# Patient Record
Sex: Female | Born: 1950
Health system: Southern US, Community
[De-identification: ages and names within clinical notes are randomized; demographics above are authoritative.]

## PROBLEM LIST (undated history)

## (undated) DIAGNOSIS — R296 Repeated falls: Secondary | ICD-10-CM

## (undated) DIAGNOSIS — Z6831 Body mass index (BMI) 31.0-31.9, adult: Secondary | ICD-10-CM

## (undated) DIAGNOSIS — M1990 Unspecified osteoarthritis, unspecified site: Secondary | ICD-10-CM

## (undated) DIAGNOSIS — F4323 Adjustment disorder with mixed anxiety and depressed mood: Secondary | ICD-10-CM

## (undated) DIAGNOSIS — R5383 Other fatigue: Secondary | ICD-10-CM

## (undated) DIAGNOSIS — Z9884 Bariatric surgery status: Secondary | ICD-10-CM

## (undated) DIAGNOSIS — S329XXA Fracture of unspecified parts of lumbosacral spine and pelvis, initial encounter for closed fracture: Secondary | ICD-10-CM

## (undated) DIAGNOSIS — I10 Essential (primary) hypertension: Secondary | ICD-10-CM

## (undated) DIAGNOSIS — R7612 Nonspecific reaction to cell mediated immunity measurement of gamma interferon antigen response without active tuberculosis: Secondary | ICD-10-CM

## (undated) DIAGNOSIS — Z9882 Breast implant status: Secondary | ICD-10-CM

## (undated) DIAGNOSIS — M069 Rheumatoid arthritis, unspecified: Secondary | ICD-10-CM

## (undated) DIAGNOSIS — J189 Pneumonia, unspecified organism: Secondary | ICD-10-CM

## (undated) DIAGNOSIS — E669 Obesity, unspecified: Secondary | ICD-10-CM

## (undated) DIAGNOSIS — F32A Depression, unspecified: Secondary | ICD-10-CM

## (undated) HISTORY — DX: Fracture of unspecified parts of lumbosacral spine and pelvis, initial encounter for closed fracture: S32.9XXA

## (undated) HISTORY — DX: Other fatigue: R53.83

## (undated) HISTORY — DX: Adjustment disorder with mixed anxiety and depressed mood: F43.23

## (undated) HISTORY — DX: Nonspecific reaction to cell mediated immunity measurement of gamma interferon antigen response without active tuberculosis: R76.12

## (undated) HISTORY — PX: APPENDECTOMY: SHX54

## (undated) HISTORY — DX: Breast implant status: Z98.82

## (undated) HISTORY — PX: GASTRIC BYPASS: SHX52

## (undated) HISTORY — DX: Bariatric surgery status: Z98.84

## (undated) HISTORY — DX: Rheumatoid arthritis, unspecified: M06.9

## (undated) HISTORY — PX: TONSILLECTOMY: SUR1361

## (undated) HISTORY — DX: Body mass index (BMI) 31.0-31.9, adult: Z68.31

## (undated) HISTORY — DX: Obesity, unspecified: E66.9

## (undated) HISTORY — PX: COLONOSCOPY: SHX174

## (undated) HISTORY — PX: BREAST ENHANCEMENT SURGERY: SHX7

## (undated) HISTORY — DX: Depression, unspecified: F32.A

## (undated) HISTORY — DX: Pneumonia, unspecified organism: J18.9

## (undated) HISTORY — DX: Unspecified osteoarthritis, unspecified site: M19.90

## (undated) HISTORY — PX: AUGMENTATION MAMMAPLASTY: SUR837

---

## 2016-05-02 LAB — HM MAMMOGRAPHY

## 2016-05-23 LAB — HM COLONOSCOPY

## 2016-07-28 LAB — HM PAP SMEAR: HM Pap smear: NORMAL

## 2017-01-12 NOTE — Progress Notes (Signed)
Subjective:    Patient ID: Julia [name], female    DOB: 05-04-1951, 66 y.o.   MRN: [id number]  HPI She is here to establish with a new pcp.    RA:  She is currently on prednisone 2 mg daily and rituxan.  She feels her RA is well controlled.  She denies joint pain or swelling.    Obese:  She is taking xenicl and has lost 40 lb over the last two years.  She tolerates the medication well.   Sensation of right foot slipping when she walks:  This started 6-8 weeks ago and it feels like the right foot is slipping when she walks, but it is not slippping  She has some pain in her right leg intermittently - mostly at night - radiating from her back. She did have recent x-rays of her right hip and knee - both normal.  She denies numbness/tingling in her leg or foot.  She thinks it is worse if she wears heels, but occurs when wearing flats.  She wonders about seeing neurology.   GERD:  She is taking her medication daily as prescribed.  She denies any GERD symptoms and feels her GERD is well controlled.   Hypothyroidism:  She is taking her medication daily.  She denies any recent changes in energy or weight that are unexplained.     Medications and allergies reviewed with patient and updated if appropriate.  Patient Active Problem List   Diagnosis Date Noted  . Hypothyroidism 01/14/2017  . Altered sensation, foot 01/14/2017  . History of tuberculosis exposure 01/14/2017  . Osteoporosis 01/14/2017  . History of gastric bypass 01/14/2017  . Rheumatoid arthritis ([place]) 01/13/2017  . GERD (gastroesophageal reflux disease) 01/13/2017   Allergies as of 01/13/2017   Not on File     Medication List       Accurate as of 01/13/17 11:59 PM. Always use your most recent med list.          esomeprazole 40 MG capsule Commonly known as:  NEXIUM Take by mouth.   Fish Oil 1000 MG Caps Take by mouth.   glucosamine-chondroitin 500-400 MG tablet Take by mouth.   ibandronate 150 MG tablet Commonly  known as:  BONIVA take 1 tablet by mouth every month   levothyroxine 25 MCG tablet Commonly known as:  SYNTHROID, LEVOTHROID Take by mouth.   loratadine 10 MG tablet Commonly known as:  CLARITIN Take by mouth.   orlistat 120 MG capsule Commonly known as:  XENICAL Take by mouth.   predniSONE 1 MG tablet Commonly known as:  DELTASONE Take 2 mg by mouth daily.   PROAIR HFA 108 (90 Base) MCG/ACT inhaler Generic drug:  albuterol inhale 2 puffs by mouth every 4 hours if needed for wheezing   Pseudoephedrine-Guaifenesin [phone] MG Tb12 Take by mouth.   riTUXimab 500 MG/50ML injection Commonly known as:  RITUXAN Inject into the vein.   THERA-M Tabs Take by mouth.   TURMERIC CURCUMIN PO Take by mouth.   valACYclovir 500 MG tablet Commonly known as:  VALTREX Take 500 mg by mouth daily.   Vitamin D3 1000 units Caps Take by mouth.       Past Medical History:  Diagnosis Date  . H/O breast augmentation   . History of gastric bypass   . Pelvis fracture ([place])    in 30's    Past Surgical History:  Procedure Laterality Date  . APPENDECTOMY    . TONSILLECTOMY  Social History   Social History  . Marital status: Married    Spouse name: N/A  . Number of children: N/A  . Years of education: N/A   Social History Main Topics  . Smoking status: Never Smoker  . Smokeless tobacco: Never Used  . Alcohol use Yes  . Drug use: No  . Sexual activity: Not Asked   Other Topics Concern  . None   Social History Narrative  . None    Family History  Problem Relation Age of Onset  . Hypertension Mother   . Heart disease Father   . Hypertension Father     Review of Systems  Constitutional: Negative for chills and fever.  Respiratory: Negative for cough, shortness of breath and wheezing.   Cardiovascular: Negative for chest pain, palpitations and leg swelling.  Gastrointestinal: Negative for abdominal pain, blood in stool, constipation, diarrhea and nausea.    Musculoskeletal: Negative for joint swelling.  Neurological: Negative for dizziness, light-headedness and headaches.  Psychiatric/Behavioral: Negative for dysphoric mood. The patient is not nervous/anxious.        Objective:   Vitals:   01/13/17 1049  BP: (!) 154/94  Pulse: 68  Temp: 97.8 F (36.6 C)   Filed Weights   01/13/17 1049  Weight: 180 lb (81.6 kg)   Body mass index is 30.9 kg/m.  Wt Readings from Last 3 Encounters:  01/13/17 180 lb (81.6 kg)     Physical Exam Constitutional: She appears well-developed and well-nourished. No distress.  HENT:  Head: Normocephalic and atraumatic.  Right Ear: External ear normal. Normal ear canal and TM Left Ear: External ear normal.  Normal ear canal and TM Mouth/Throat: Oropharynx is clear and moist.  Eyes: Conjunctivae and EOM are normal.  Neck: Neck supple. No tracheal deviation present. No thyromegaly present.  No carotid bruit  Cardiovascular: Normal rate, regular rhythm and normal heart sounds.   No murmur heard.  No edema. Pulmonary/Chest: Effort normal and breath sounds normal. No respiratory distress. She has no wheezes. She has no rales.  Abdominal: Soft. She exhibits no distension. There is no tenderness.  Lymphadenopathy: She has no cervical adenopathy.  Skin: Skin is warm and dry. She is not diaphoretic.  Psychiatric: She has a normal mood and affect. Her behavior is normal.         Assessment & Plan:   See Problem List for Assessment and Plan of chronic medical problems.

## 2017-01-13 ENCOUNTER — Ambulatory Visit (INDEPENDENT_AMBULATORY_CARE_PROVIDER_SITE_OTHER): Payer: BLUE CROSS/BLUE SHIELD | Admitting: Internal Medicine

## 2017-01-13 ENCOUNTER — Encounter: Payer: Self-pay | Admitting: Internal Medicine

## 2017-01-13 VITALS — BP 154/94 | HR 68 | Temp 97.8°F | Ht 64.0 in | Wt 180.0 lb

## 2017-01-13 DIAGNOSIS — M069 Rheumatoid arthritis, unspecified: Secondary | ICD-10-CM

## 2017-01-13 DIAGNOSIS — Z201 Contact with and (suspected) exposure to tuberculosis: Secondary | ICD-10-CM

## 2017-01-13 DIAGNOSIS — K219 Gastro-esophageal reflux disease without esophagitis: Secondary | ICD-10-CM

## 2017-01-13 DIAGNOSIS — M810 Age-related osteoporosis without current pathological fracture: Secondary | ICD-10-CM | POA: Diagnosis not present

## 2017-01-13 DIAGNOSIS — R209 Unspecified disturbances of skin sensation: Secondary | ICD-10-CM | POA: Diagnosis not present

## 2017-01-13 DIAGNOSIS — E039 Hypothyroidism, unspecified: Secondary | ICD-10-CM

## 2017-01-13 DIAGNOSIS — Z1283 Encounter for screening for malignant neoplasm of skin: Secondary | ICD-10-CM | POA: Diagnosis not present

## 2017-01-13 HISTORY — DX: Gastro-esophageal reflux disease without esophagitis: K21.9

## 2017-01-13 HISTORY — DX: Rheumatoid arthritis, unspecified: M06.9

## 2017-01-13 NOTE — Patient Instructions (Signed)
   Medications reviewed and updated.  No changes recommended at this time.  Your prescription(s) have been submitted to your pharmacy. Please take as directed and contact our office if you believe you are having problem(s) with the medication(s).  A referral was ordered for neurology and rheumatology.

## 2017-01-14 ENCOUNTER — Encounter: Payer: Self-pay | Admitting: Internal Medicine

## 2017-01-14 DIAGNOSIS — Z201 Contact with and (suspected) exposure to tuberculosis: Secondary | ICD-10-CM

## 2017-01-14 DIAGNOSIS — Z860100 Personal history of colon polyps, unspecified: Secondary | ICD-10-CM

## 2017-01-14 DIAGNOSIS — M810 Age-related osteoporosis without current pathological fracture: Secondary | ICD-10-CM

## 2017-01-14 DIAGNOSIS — R209 Unspecified disturbances of skin sensation: Secondary | ICD-10-CM | POA: Insufficient documentation

## 2017-01-14 DIAGNOSIS — E039 Hypothyroidism, unspecified: Secondary | ICD-10-CM | POA: Insufficient documentation

## 2017-01-14 DIAGNOSIS — Z9884 Bariatric surgery status: Secondary | ICD-10-CM | POA: Insufficient documentation

## 2017-01-14 HISTORY — DX: Personal history of colon polyps, unspecified: Z86.0100

## 2017-01-14 HISTORY — DX: Personal history of colonic polyps: Z86.010

## 2017-01-14 HISTORY — DX: Age-related osteoporosis without current pathological fracture: M81.0

## 2017-01-14 HISTORY — DX: Hypothyroidism, unspecified: E03.9

## 2017-01-14 HISTORY — DX: Contact with and (suspected) exposure to tuberculosis: Z20.1

## 2017-01-14 NOTE — Assessment & Plan Note (Signed)
GERD controlled Continue daily medication  

## 2017-01-14 NOTE — Assessment & Plan Note (Signed)
Has tried several medication in the past Currently well controlled on prednisone 2 mg and Rituxan Moving to area and needs to establish with new Rheumatologist - will refer to Dr Amil Amen

## 2017-01-14 NOTE — Assessment & Plan Note (Signed)
Will check tsh at CPE in a few months

## 2017-01-14 NOTE — Assessment & Plan Note (Signed)
Feeling of right foot slipping when she walks Will refer to neuro

## 2017-01-14 NOTE — Assessment & Plan Note (Signed)
On Boniva 

## 2017-01-27 ENCOUNTER — Encounter: Payer: Self-pay | Admitting: Neurology

## 2017-02-21 ENCOUNTER — Encounter: Payer: Self-pay | Admitting: Internal Medicine

## 2017-03-06 ENCOUNTER — Ambulatory Visit: Payer: BLUE CROSS/BLUE SHIELD | Admitting: Neurology

## 2017-03-17 ENCOUNTER — Other Ambulatory Visit: Payer: Self-pay | Admitting: Internal Medicine

## 2017-03-21 ENCOUNTER — Telehealth: Payer: Self-pay | Admitting: Internal Medicine

## 2017-03-21 MED ORDER — ORLISTAT 120 MG PO CAPS: 120.0000 mg | ORAL_CAPSULE | Freq: Three times a day (TID) | ORAL | 3 refills | Status: DC

## 2017-03-21 NOTE — Telephone Encounter (Signed)
Sent electronically...Julia [name]

## 2017-03-21 NOTE — Telephone Encounter (Signed)
Ok to fill - she just established with me

## 2017-03-21 NOTE — Telephone Encounter (Signed)
Pls advise since med has not been refilled by you...Julia [name]

## 2017-03-21 NOTE — Telephone Encounter (Signed)
Requesting refill on xenical

## 2017-03-23 ENCOUNTER — Ambulatory Visit: Payer: BLUE CROSS/BLUE SHIELD | Admitting: Neurology

## 2017-04-17 ENCOUNTER — Other Ambulatory Visit: Payer: Self-pay | Admitting: Internal Medicine

## 2017-04-18 DIAGNOSIS — D229 Melanocytic nevi, unspecified: Secondary | ICD-10-CM | POA: Insufficient documentation

## 2017-04-18 DIAGNOSIS — D692 Other nonthrombocytopenic purpura: Secondary | ICD-10-CM

## 2017-04-18 DIAGNOSIS — L723 Sebaceous cyst: Secondary | ICD-10-CM

## 2017-04-18 DIAGNOSIS — D1800 Hemangioma unspecified site: Secondary | ICD-10-CM

## 2017-04-18 DIAGNOSIS — L814 Other melanin hyperpigmentation: Secondary | ICD-10-CM

## 2017-04-18 DIAGNOSIS — Q8789 Other specified congenital malformation syndromes, not elsewhere classified: Secondary | ICD-10-CM

## 2017-04-18 HISTORY — DX: Hemangioma unspecified site: D18.00

## 2017-04-18 HISTORY — DX: Melanocytic nevi, unspecified: D22.9

## 2017-04-18 HISTORY — DX: Sebaceous cyst: L72.3

## 2017-04-18 HISTORY — DX: Other specified congenital malformation syndromes, not elsewhere classified: Q87.89

## 2017-04-18 HISTORY — DX: Other melanin hyperpigmentation: L81.4

## 2017-04-18 HISTORY — DX: Other nonthrombocytopenic purpura: D69.2

## 2017-05-16 ENCOUNTER — Other Ambulatory Visit: Payer: Self-pay | Admitting: Internal Medicine

## 2017-06-08 ENCOUNTER — Other Ambulatory Visit: Payer: Self-pay | Admitting: Internal Medicine

## 2017-06-19 NOTE — Progress Notes (Signed)
Subjective:    Patient ID: Julia [name], female    DOB: 06/13/1951, 66 y.o.   MRN: [id number]  HPI She is here for a physical exam.   Xenical is still working well for her.    She is off of prednisone completely.      Medications and allergies reviewed with patient and updated if appropriate.  Patient Active Problem List   Diagnosis Date Noted  . Hypothyroidism 01/14/2017  . Altered sensation, foot 01/14/2017  . History of tuberculosis exposure 01/14/2017  . Osteoporosis 01/14/2017  . History of gastric bypass 01/14/2017  . History of colon polyps 01/14/2017  . Rheumatoid arthritis ([place]) 01/13/2017  . GERD (gastroesophageal reflux disease) 01/13/2017    Current Outpatient Medications on File Prior to Visit  Medication Sig Dispense Refill  . Cholecalciferol (VITAMIN D3) 1000 units CAPS Take by mouth.    Marland Kitchen glucosamine-chondroitin 500-400 MG tablet Take by mouth.    . ibandronate (BONIVA) 150 MG tablet take 1 tablet by mouth every month  0  . loratadine (CLARITIN) 10 MG tablet Take by mouth.    . Multiple Vitamins-Minerals (THERA-M) TABS Take by mouth.    . Omega-3 Fatty Acids (FISH OIL) 1000 MG CAPS Take by mouth.    Marland Kitchen PROAIR HFA 108 (90 Base) MCG/ACT inhaler inhale 2 puffs by mouth every 4 hours if needed for wheezing  0  . Pseudoephedrine-Guaifenesin [phone] MG TB12 Take by mouth.    . riTUXimab (RITUXAN) 500 MG/50ML injection Inject into the vein.    . TURMERIC CURCUMIN PO Take by mouth.     No current facility-administered medications on file prior to visit.     Past Medical History:  Diagnosis Date  . H/O breast augmentation   . History of gastric bypass   . Pelvis fracture ([place])    in 30's    Past Surgical History:  Procedure Laterality Date  . APPENDECTOMY    . TONSILLECTOMY      Social History   Socioeconomic History  . Marital status: Married    Spouse name: None  . Number of children: None  . Years of education: None  . Highest education  level: None  Social Needs  . Financial resource strain: None  . Food insecurity - worry: None  . Food insecurity - inability: None  . Transportation needs - medical: None  . Transportation needs - non-medical: None  Occupational History  . None  Tobacco Use  . Smoking status: Never Smoker  . Smokeless tobacco: Never Used  Substance and Sexual Activity  . Alcohol use: Yes  . Drug use: No  . Sexual activity: None  Other Topics Concern  . None  Social History Narrative   Retired 07/10/17 from [employer]   No regular exercise    Family History  Problem Relation Age of Onset  . Hypertension Mother   . Heart disease Father   . Hypertension Father     Review of Systems  Constitutional: Negative for chills, fatigue and fever.  Eyes: Negative for visual disturbance.  Respiratory: Negative for cough, shortness of breath and wheezing.   Cardiovascular: Negative for chest pain, palpitations and leg swelling.  Gastrointestinal: Negative for abdominal pain, blood in stool, constipation, diarrhea and nausea.  Genitourinary: Negative for dysuria and hematuria.  Musculoskeletal: Negative for arthralgias.  Skin: Negative for color change and rash.  Neurological: Negative for light-headedness and headaches.       Objective:   Vitals:   06/22/17 0824  BP:  124/84  Pulse: 68  Resp: 16  Temp: 97.6 F (36.4 C)  SpO2: 96%   Filed Weights   06/22/17 0824  Weight: 183 lb (83 kg)   Body mass index is 31.41 kg/m.  Wt Readings from Last 3 Encounters:  06/22/17 183 lb (83 kg)  01/13/17 180 lb (81.6 kg)     Physical Exam Constitutional: She appears well-developed and well-nourished. No distress.  HENT:  Head: Normocephalic and atraumatic.  Right Ear: External ear normal. Normal ear canal and TM Left Ear: External ear normal.  Normal ear canal and TM Mouth/Throat: Oropharynx is clear and moist.  Eyes: Conjunctivae and EOM are normal.  Neck: Neck supple. No tracheal deviation  present. No thyromegaly present.  No carotid bruit  Cardiovascular: Normal rate, regular rhythm and normal heart sounds.   No murmur heard.  No edema. Pulmonary/Chest: Effort normal and breath sounds normal. No respiratory distress. She has no wheezes. She has no rales.  Breast: deferred to Gyn Abdominal: Soft. She exhibits no distension. There is no tenderness.  Lymphadenopathy: She has no cervical adenopathy.  Skin: Skin is warm and dry. She is not diaphoretic.  Psychiatric: She has a normal mood and affect. Her behavior is normal.        Assessment & Plan:   Physical exam: Screening blood work   ordered Immunizations   Flu vaccine up to date, discussed shingrix Colonoscopy  Done at prior practice - 2018 - due in 5 years Mammogram   Up to date  Gyn    Up to date  Dexa - has been on prednisone for years - will order a dexa Eye exams    Up to date  EKG    None on file Exercise  - no regular exercise Weight - discussed weight loss - on xenical Skin   No concerns - has seen derm Substance abuse   none  See Problem List for Assessment and Plan of chronic medical problems.    FU in 1 year

## 2017-06-19 NOTE — Patient Instructions (Addendum)
[facility]  [address]  [place]  [place], [place] [address]  Main: [facility]   Test(s) ordered today. Your results will be released to [place] (or called to you) after review, usually within 72hours after test completion. If any changes need to be made, you will be notified at that same time.  All other Health Maintenance issues reviewed.   All recommended immunizations and age-appropriate screenings are up-to-date or discussed.  No immunizations administered today.  You are on the wait list for the new shingles vaccine - discuss this with Dr Amil Amen.   Medications reviewed and updated.  No changes recommended at this time.  Your prescription(s) have been submitted to your pharmacy. Please take as directed and contact our office if you believe you are having problem(s) with the medication(s).   Please followup in one year   Health Maintenance, Female Adopting a healthy lifestyle and getting preventive care can go a long way to promote health and wellness. Talk with your health care provider about what schedule of regular examinations is right for you. This is a good chance for you to check in with your provider about disease prevention and staying healthy. In between checkups, there are plenty of things you can do on your own. Experts have done a lot of research about which lifestyle changes and preventive measures are most likely to keep you healthy. Ask your health care provider for more information. Weight and diet Eat a healthy diet  Be sure to include plenty of vegetables, fruits, low-fat dairy products, and lean protein.  Do not eat a lot of foods high in solid fats, added sugars, or salt.  Get regular exercise. This is one of the most important things you can do for your health. ? Most adults should exercise for at least 150 minutes each week. The exercise should increase your heart rate and make you sweat  (moderate-intensity exercise). ? Most adults should also do strengthening exercises at least twice a week. This is in addition to the moderate-intensity exercise.  Maintain a healthy weight  Body mass index (BMI) is a measurement that can be used to identify possible weight problems. It estimates body fat based on height and weight. Your health care provider can help determine your BMI and help you achieve or maintain a healthy weight.  For females 92 years of age and older: ? A BMI below 18.5 is considered underweight. ? A BMI of 18.5 to 24.9 is normal. ? A BMI of 25 to 29.9 is considered overweight. ? A BMI of 30 and above is considered obese.  Watch levels of cholesterol and blood lipids  You should start having your blood tested for lipids and cholesterol at 64 years of age, then have this test every 5 years.  You may need to have your cholesterol levels checked more often if: ? Your lipid or cholesterol levels are high. ? You are older than 64 years of age. ? You are at high risk for heart disease.  Cancer screening Lung Cancer  Lung cancer screening is recommended for adults 58-41 years old who are at high risk for lung cancer because of a history of smoking.  A yearly low-dose CT scan of the lungs is recommended for people who: ? Currently smoke. ? Have quit within the past 15 years. ? Have at least a 30-pack-year history of smoking. A pack year is smoking an average of one pack of cigarettes a day for 1  year.  Yearly screening should continue until it has been 15 years since you quit.  Yearly screening should stop if you develop a health problem that would prevent you from having lung cancer treatment.  Breast Cancer  Practice breast self-awareness. This means understanding how your breasts normally appear and feel.  It also means doing regular breast self-exams. Let your health care provider know about any changes, no matter how small.  If you are in your 20s or  30s, you should have a clinical breast exam (CBE) by a health care provider every 1-3 years as part of a regular health exam.  If you are 17 or older, have a CBE every year. Also consider having a breast X-ray (mammogram) every year.  If you have a family history of breast cancer, talk to your health care provider about genetic screening.  If you are at high risk for breast cancer, talk to your health care provider about having an MRI and a mammogram every year.  Breast cancer gene (BRCA) assessment is recommended for women who have family members with BRCA-related cancers. BRCA-related cancers include: ? Breast. ? Ovarian. ? Tubal. ? Peritoneal cancers.  Results of the assessment will determine the need for genetic counseling and BRCA1 and BRCA2 testing.  Cervical Cancer Your health care provider may recommend that you be screened regularly for cancer of the pelvic organs (ovaries, uterus, and vagina). This screening involves a pelvic examination, including checking for microscopic changes to the surface of your cervix (Pap test). You may be encouraged to have this screening done every 3 years, beginning at age 33.  For women ages 38-65, health care providers may recommend pelvic exams and Pap testing every 3 years, or they may recommend the Pap and pelvic exam, combined with testing for human papilloma virus (HPV), every 5 years. Some types of HPV increase your risk of cervical cancer. Testing for HPV may also be done on women of any age with unclear Pap test results.  Other health care providers may not recommend any screening for nonpregnant women who are considered low risk for pelvic cancer and who do not have symptoms. Ask your health care provider if a screening pelvic exam is right for you.  If you have had past treatment for cervical cancer or a condition that could lead to cancer, you need Pap tests and screening for cancer for at least 20 years after your treatment. If Pap tests  have been discontinued, your risk factors (such as having a new sexual partner) need to be reassessed to determine if screening should resume. Some women have medical problems that increase the chance of getting cervical cancer. In these cases, your health care provider may recommend more frequent screening and Pap tests.  Colorectal Cancer  This type of cancer can be detected and often prevented.  Routine colorectal cancer screening usually begins at 64 years of age and continues through 64 years of age.  Your health care provider may recommend screening at an earlier age if you have risk factors for colon cancer.  Your health care provider may also recommend using home test kits to check for hidden blood in the stool.  A small camera at the end of a tube can be used to examine your colon directly (sigmoidoscopy or colonoscopy). This is done to check for the earliest forms of colorectal cancer.  Routine screening usually begins at age 34.  Direct examination of the colon should be repeated every 5-10 years through 64 years  of age. However, you may need to be screened more often if early forms of precancerous polyps or small growths are found.  Skin Cancer  Check your skin from head to toe regularly.  Tell your health care provider about any new moles or changes in moles, especially if there is a change in a mole's shape or color.  Also tell your health care provider if you have a mole that is larger than the size of a pencil eraser.  Always use sunscreen. Apply sunscreen liberally and repeatedly throughout the day.  Protect yourself by wearing long sleeves, pants, a wide-brimmed hat, and sunglasses whenever you are outside.  Heart disease, diabetes, and high blood pressure  High blood pressure causes heart disease and increases the risk of stroke. High blood pressure is more likely to develop in: ? People who have blood pressure in the high end of the normal range (130-139/85-89 mm  Hg). ? People who are overweight or obese. ? People who are African American.  If you are 63-20 years of age, have your blood pressure checked every 3-5 years. If you are 65 years of age or older, have your blood pressure checked every year. You should have your blood pressure measured twice-once when you are at a hospital or clinic, and once when you are not at a hospital or clinic. Record the average of the two measurements. To check your blood pressure when you are not at a hospital or clinic, you can use: ? An automated blood pressure machine at a pharmacy. ? A home blood pressure monitor.  If you are between 1 years and 34 years old, ask your health care provider if you should take aspirin to prevent strokes.  Have regular diabetes screenings. This involves taking a blood sample to check your fasting blood sugar level. ? If you are at a normal weight and have a low risk for diabetes, have this test once every three years after 64 years of age. ? If you are overweight and have a high risk for diabetes, consider being tested at a younger age or more often. Preventing infection Hepatitis B  If you have a higher risk for hepatitis B, you should be screened for this virus. You are considered at high risk for hepatitis B if: ? You were born in a country where hepatitis B is common. Ask your health care provider which countries are considered high risk. ? Your parents were born in a high-risk country, and you have not been immunized against hepatitis B (hepatitis B vaccine). ? You have HIV or AIDS. ? You use needles to inject street drugs. ? You live with someone who has hepatitis B. ? You have had sex with someone who has hepatitis B. ? You get hemodialysis treatment. ? You take certain medicines for conditions, including cancer, organ transplantation, and autoimmune conditions.  Hepatitis C  Blood testing is recommended for: ? Everyone born from [birth year] through 1963. ? Anyone with known  risk factors for hepatitis C.  Sexually transmitted infections (STIs)  You should be screened for sexually transmitted infections (STIs) including gonorrhea and chlamydia if: ? You are sexually active and are younger than 64 years of age. ? You are older than 64 years of age and your health care provider tells you that you are at risk for this type of infection. ? Your sexual activity has changed since you were last screened and you are at an increased risk for chlamydia or gonorrhea. Ask your health care  provider if you are at risk.  If you do not have HIV, but are at risk, it may be recommended that you take a prescription medicine daily to prevent HIV infection. This is called pre-exposure prophylaxis (PrEP). You are considered at risk if: ? You are sexually active and do not regularly use condoms or know the HIV status of your partner(s). ? You take drugs by injection. ? You are sexually active with a partner who has HIV.  Talk with your health care provider about whether you are at high risk of being infected with HIV. If you choose to begin PrEP, you should first be tested for HIV. You should then be tested every 3 months for as long as you are taking PrEP. Pregnancy  If you are premenopausal and you may become pregnant, ask your health care provider about preconception counseling.  If you may become pregnant, take 400 to 800 micrograms (mcg) of folic acid every day.  If you want to prevent pregnancy, talk to your health care provider about birth control (contraception). Osteoporosis and menopause  Osteoporosis is a disease in which the bones lose minerals and strength with aging. This can result in serious bone fractures. Your risk for osteoporosis can be identified using a bone density scan.  If you are 28 years of age or older, or if you are at risk for osteoporosis and fractures, ask your health care provider if you should be screened.  Ask your health care provider whether you  should take a calcium or vitamin D supplement to lower your risk for osteoporosis.  Menopause may have certain physical symptoms and risks.  Hormone replacement therapy may reduce some of these symptoms and risks. Talk to your health care provider about whether hormone replacement therapy is right for you. Follow these instructions at home:  Schedule regular health, dental, and eye exams.  Stay current with your immunizations.  Do not use any tobacco products including cigarettes, chewing tobacco, or electronic cigarettes.  If you are pregnant, do not drink alcohol.  If you are breastfeeding, limit how much and how often you drink alcohol.  Limit alcohol intake to no more than 1 drink per day for nonpregnant women. One drink equals 12 ounces of beer, 5 ounces of wine, or 1 ounces of hard liquor.  Do not use street drugs.  Do not share needles.  Ask your health care provider for help if you need support or information about quitting drugs.  Tell your health care provider if you often feel depressed.  Tell your health care provider if you have ever been abused or do not feel safe at home. This information is not intended to replace advice given to you by your health care provider. Make sure you discuss any questions you have with your health care provider. Document Released: 01/10/2011 Document Revised: 12/03/2015 Document Reviewed: 03/31/2015 Elsevier Interactive Patient Education  [employer].

## 2017-06-22 ENCOUNTER — Other Ambulatory Visit (INDEPENDENT_AMBULATORY_CARE_PROVIDER_SITE_OTHER): Payer: BLUE CROSS/BLUE SHIELD

## 2017-06-22 ENCOUNTER — Telehealth: Payer: Self-pay | Admitting: Internal Medicine

## 2017-06-22 ENCOUNTER — Ambulatory Visit (INDEPENDENT_AMBULATORY_CARE_PROVIDER_SITE_OTHER): Payer: BLUE CROSS/BLUE SHIELD | Admitting: Internal Medicine

## 2017-06-22 ENCOUNTER — Encounter: Payer: Self-pay | Admitting: Internal Medicine

## 2017-06-22 VITALS — BP 124/84 | HR 68 | Temp 97.6°F | Resp 16 | Ht 64.0 in | Wt 183.0 lb

## 2017-06-22 DIAGNOSIS — E2839 Other primary ovarian failure: Secondary | ICD-10-CM

## 2017-06-22 DIAGNOSIS — M069 Rheumatoid arthritis, unspecified: Secondary | ICD-10-CM

## 2017-06-22 DIAGNOSIS — E039 Hypothyroidism, unspecified: Secondary | ICD-10-CM

## 2017-06-22 DIAGNOSIS — Z13820 Encounter for screening for osteoporosis: Secondary | ICD-10-CM | POA: Diagnosis not present

## 2017-06-22 DIAGNOSIS — E559 Vitamin D deficiency, unspecified: Secondary | ICD-10-CM

## 2017-06-22 DIAGNOSIS — Z0000 Encounter for general adult medical examination without abnormal findings: Secondary | ICD-10-CM | POA: Diagnosis not present

## 2017-06-22 DIAGNOSIS — M810 Age-related osteoporosis without current pathological fracture: Secondary | ICD-10-CM | POA: Diagnosis not present

## 2017-06-22 DIAGNOSIS — K219 Gastro-esophageal reflux disease without esophagitis: Secondary | ICD-10-CM | POA: Diagnosis not present

## 2017-06-22 HISTORY — DX: Vitamin D deficiency, unspecified: E55.9

## 2017-06-22 LAB — COMPREHENSIVE METABOLIC PANEL
ALBUMIN: 4.3 g/dL (ref 3.5–5.2)
ALK PHOS: 66 U/L (ref 39–117)
ALT: 14 U/L (ref 0–35)
AST: 18 U/L (ref 0–37)
BILIRUBIN TOTAL: 0.6 mg/dL (ref 0.2–1.2)
BUN: 10 mg/dL (ref 6–23)
CO2: 30 mEq/L (ref 19–32)
Calcium: 9.2 mg/dL (ref 8.4–10.5)
Chloride: 103 mEq/L (ref 96–112)
Creatinine, Ser: 0.54 mg/dL (ref 0.40–1.20)
GFR: 120.74 mL/min (ref >60.00)
GLUCOSE: 85 mg/dL (ref 70–99)
POTASSIUM: 3.9 meq/L (ref 3.5–5.1)
SODIUM: 140 meq/L (ref 135–145)
TOTAL PROTEIN: 7 g/dL (ref 6.0–8.3)

## 2017-06-22 LAB — CBC WITH DIFFERENTIAL/PLATELET
BASOS ABS: 0.1 10*3/uL (ref 0.0–0.1)
Basophils Relative: 1.4 % (ref 0.0–3.0)
EOS ABS: 0.2 10*3/uL (ref 0.0–0.7)
EOS PCT: 3.8 % (ref 0.0–5.0)
HCT: 41 % (ref 36.0–46.0)
HEMOGLOBIN: 13.5 g/dL (ref 12.0–15.0)
LYMPHS ABS: 0.9 10*3/uL (ref 0.7–4.0)
Lymphocytes Relative: 20.1 % (ref 12.0–46.0)
MCHC: 32.8 g/dL (ref 30.0–36.0)
MCV: 96.9 fl (ref 78.0–100.0)
MONO ABS: 0.8 10*3/uL (ref 0.1–1.0)
Monocytes Relative: 17.2 % — ABNORMAL HIGH (ref 3.0–12.0)
NEUTROS PCT: 57.5 % (ref 43.0–77.0)
Neutro Abs: 2.6 10*3/uL (ref 1.4–7.7)
Platelets: 322 10*3/uL (ref 150.0–400.0)
RBC: 4.23 Mil/uL (ref 3.87–5.11)
RDW: 12.9 % (ref 11.5–15.5)
WBC: 4.6 10*3/uL (ref 4.0–10.5)

## 2017-06-22 LAB — VITAMIN D 25 HYDROXY (VIT D DEFICIENCY, FRACTURES): VITD: 43.25 ng/mL (ref 30.00–100.00)

## 2017-06-22 LAB — LIPID PANEL
CHOLESTEROL: 152 mg/dL (ref 0–200)
HDL: 87.8 mg/dL (ref >39.00)
LDL CALC: 53 mg/dL (ref 0–99)
NonHDL: 63.85
TRIGLYCERIDES: 53 mg/dL (ref 0.0–149.0)
Total CHOL/HDL Ratio: 2
VLDL: 10.6 mg/dL (ref 0.0–40.0)

## 2017-06-22 LAB — TSH: TSH: 2.09 u[IU]/mL (ref 0.35–4.50)

## 2017-06-22 MED ORDER — ORLISTAT 120 MG PO CAPS: 120.0000 mg | ORAL_CAPSULE | Freq: Three times a day (TID) | ORAL | 3 refills | Status: DC

## 2017-06-22 MED ORDER — VALACYCLOVIR HCL 500 MG PO TABS: 500.0000 mg | ORAL_TABLET | Freq: Every day | ORAL | 3 refills | Status: DC

## 2017-06-22 MED ORDER — LEVOTHYROXINE SODIUM 25 MCG PO TABS: 25.0000 ug | ORAL_TABLET | Freq: Every day | ORAL | 3 refills | Status: DC

## 2017-06-22 MED ORDER — ESOMEPRAZOLE MAGNESIUM 40 MG PO CPDR: 40.0000 mg | DELAYED_RELEASE_CAPSULE | Freq: Every day | ORAL | 3 refills | Status: DC

## 2017-06-22 NOTE — Telephone Encounter (Signed)
I discussed this with pt at discharge. Does referral need to be entered for gyn and ophthalmology?

## 2017-06-22 NOTE — Telephone Encounter (Signed)
Patient states that Dr. Quay Burow is to refer her to opthalmologic and for breast exam.

## 2017-06-22 NOTE — Telephone Encounter (Signed)
No referral needed - information given in patient instructions.

## 2017-06-22 NOTE — Assessment & Plan Note (Signed)
Check tsh  Titrate med dose if needed  

## 2017-06-22 NOTE — Assessment & Plan Note (Signed)
GERD controlled Continue daily medication  

## 2017-06-22 NOTE — Telephone Encounter (Signed)
Advised pt to look back at AVS for necessary information.

## 2017-06-22 NOTE — Assessment & Plan Note (Signed)
Doing well, no joint pain Off steroids Following with Dr Amil Amen

## 2017-06-22 NOTE — Assessment & Plan Note (Signed)
Taking vitamin d Check level 

## 2017-07-25 ENCOUNTER — Ambulatory Visit (INDEPENDENT_AMBULATORY_CARE_PROVIDER_SITE_OTHER): Payer: BLUE CROSS/BLUE SHIELD | Admitting: *Deleted

## 2017-07-25 ENCOUNTER — Ambulatory Visit (INDEPENDENT_AMBULATORY_CARE_PROVIDER_SITE_OTHER)
Admission: RE | Admit: 2017-07-25 | Discharge: 2017-07-25 | Disposition: A | Discharge status: Home / Self Care | Payer: BLUE CROSS/BLUE SHIELD | Source: Ambulatory Visit | Attending: Internal Medicine | Admitting: Internal Medicine

## 2017-07-25 DIAGNOSIS — Z23 Encounter for immunization: Secondary | ICD-10-CM

## 2017-07-25 DIAGNOSIS — E2839 Other primary ovarian failure: Secondary | ICD-10-CM | POA: Diagnosis not present

## 2017-07-25 DIAGNOSIS — Z13820 Encounter for screening for osteoporosis: Secondary | ICD-10-CM

## 2017-07-27 DIAGNOSIS — E2839 Other primary ovarian failure: Secondary | ICD-10-CM | POA: Diagnosis not present

## 2017-07-28 ENCOUNTER — Telehealth: Payer: Self-pay | Admitting: Internal Medicine

## 2017-07-28 NOTE — Telephone Encounter (Signed)
Copied from [place] [id number]. Topic: Quick Communication - See Telephone Encounter >> Jul 28, 2017  9:12 AM Ether Griffins B wrote: CRM for notification. See Telephone encounter for:  Pt would really like to talk with Dr. Quay Burow about her bone density. She cant get in with her rheumatologist until 08/29/17. She was told to call back if she couldn't get in with other doctor in a timely manner. She is going to send over her last dexa from 2011 today via fax so Dr. Quay Burow can compare the two and call her back next week to discuss what is going on.  07/28/17.

## 2017-07-28 NOTE — Telephone Encounter (Signed)
Appointment scheduled.

## 2017-07-28 NOTE — Telephone Encounter (Signed)
Pt can schedule appt to discuss Bone density and treatment.

## 2017-08-02 NOTE — Progress Notes (Signed)
Subjective:    Patient ID: Julia [name], female    DOB: 1950-08-03, 67 y.o.   MRN: [id number]  HPI The patient is here for follow up to review her dexa.  She is currently on Boniva.  She is not currently taking calcium.  She is taking vitamin D at thousand units daily.  She is doing some exercise, but not regularly.   DEXA 05/13/2010 in [place], osteopenia   Spine: -2.3   LFN  -1.7   RFN:  -1.8     FRAX  15%, 1.9%  DEXA  07/25/17   Osteoporosis    Spine  -3.1,  LFN  -2.4,  RFN  -2.2   She has been on fosamax/boniva for about 5 years.      Reviewed her test results above.  Discussed some of the causes of osteoporosis.  Discussed some of the treatment options.  Stressed the importance of starting calcium and taking regularly.  Increase vitamin D to 2000 units daily.  Recommended discussing this with Dr. Amil Amen as well.  She is an appointment with him next month.  Medications and allergies reviewed with patient and updated if appropriate.  Patient Active Problem List   Diagnosis Date Noted  . Vitamin D deficiency 06/22/2017  . Hypothyroidism 01/14/2017  . Altered sensation, foot 01/14/2017  . History of tuberculosis exposure 01/14/2017  . Osteoporosis 01/14/2017  . History of gastric bypass 01/14/2017  . History of colon polyps 01/14/2017  . Rheumatoid arthritis ([place]) 01/13/2017  . GERD (gastroesophageal reflux disease) 01/13/2017    Current Outpatient Medications on File Prior to Visit  Medication Sig Dispense Refill  . Cholecalciferol (VITAMIN D3) 1000 units CAPS Take by mouth.    . esomeprazole (NEXIUM) 40 MG capsule Take 1 capsule (40 mg total) by mouth daily. 90 capsule 3  . glucosamine-chondroitin 500-400 MG tablet Take by mouth.    . ibandronate (BONIVA) 150 MG tablet take 1 tablet by mouth every month  0  . levothyroxine (SYNTHROID, LEVOTHROID) 25 MCG tablet Take 1 tablet (25 mcg total) by mouth daily. 90 tablet 3  . loratadine (CLARITIN) 10 MG tablet Take by  mouth.    . Multiple Vitamins-Minerals (THERA-M) TABS Take by mouth.    . Omega-3 Fatty Acids (FISH OIL) 1000 MG CAPS Take by mouth.    . orlistat (XENICAL) 120 MG capsule Take 1 capsule (120 mg total) by mouth 3 (three) times daily with meals. 270 capsule 3  . PROAIR HFA 108 (90 Base) MCG/ACT inhaler inhale 2 puffs by mouth every 4 hours if needed for wheezing  0  . Pseudoephedrine-Guaifenesin 907 152 2383 MG TB12 Take by mouth.    . riTUXimab (RITUXAN) 500 MG/50ML injection Inject into the vein.    . TURMERIC CURCUMIN PO Take by mouth.    . valACYclovir (VALTREX) 500 MG tablet Take 1 tablet (500 mg total) by mouth daily. 90 tablet 3   No current facility-administered medications on file prior to visit.     Past Medical History:  Diagnosis Date  . H/O breast augmentation   . History of gastric bypass   . Pelvis fracture ([place])    in 30's    Past Surgical History:  Procedure Laterality Date  . APPENDECTOMY    . TONSILLECTOMY      Social History   Socioeconomic History  . Marital status: Married    Spouse name: None  . Number of children: None  . Years of education: None  . Highest education level: None  Social Needs  . Financial resource strain: None  . Food insecurity - worry: None  . Food insecurity - inability: None  . Transportation needs - medical: None  . Transportation needs - non-medical: None  Occupational History  . None  Tobacco Use  . Smoking status: Never Smoker  . Smokeless tobacco: Never Used  Substance and Sexual Activity  . Alcohol use: Yes  . Drug use: No  . Sexual activity: None  Other Topics Concern  . None  Social History Narrative   Retired 07/10/17 from [employer]   No regular exercise    Family History  Problem Relation Age of Onset  . Hypertension Mother   . Heart disease Father   . Hypertension Father     Review of Systems     Objective:   Vitals:   08/03/17 1119  BP: 134/82  Pulse: 79  Resp: 16  Temp: 98 F (36.7 C)  SpO2: 98%    Wt Readings from Last 3 Encounters:  08/03/17 181 lb (82.1 kg)  06/22/17 183 lb (83 kg)  01/13/17 180 lb (81.6 kg)   Body mass index is 31.07 kg/m.   Physical Exam         Assessment & Plan:    25 minutes were spent face-to-face with the patient, over 50% of which was spent counseling regarding her new diagnosis of osteoporosis including etiology, risks of osteoporosis and treatment options   See Problem List for Assessment and Plan of chronic medical problems.

## 2017-08-03 ENCOUNTER — Ambulatory Visit: Payer: BLUE CROSS/BLUE SHIELD | Admitting: Internal Medicine

## 2017-08-03 ENCOUNTER — Encounter: Payer: Self-pay | Admitting: Internal Medicine

## 2017-08-03 VITALS — BP 134/82 | HR 79 | Temp 98.0°F | Resp 16 | Wt 181.0 lb

## 2017-08-03 DIAGNOSIS — M810 Age-related osteoporosis without current pathological fracture: Secondary | ICD-10-CM | POA: Diagnosis not present

## 2017-08-03 NOTE — Assessment & Plan Note (Addendum)
New diagnosis We reviewed the likely causes of her osteoporosis Discussed the importance of calcium 600 mg twice daily with food Increase vitamin D to 2000 units daily-stressed the importance of taking both of these regularly Stressed the importance of weightbearing exercises in addition to yoga and light weights Discussed treatment options-she has been taking Boniva/Fosamax for approximately 5 years so I would not recommend a bisphosphonate Discussed Prolia, which is what I recommend Discussed briefly Gelene Mink is unsure if she would be compliant with daily injections at home Encouraged her to discuss this with Dr. Amil Amen We will look into her insurance coverage for Prolia and we can start that immediately if covered Repeat bone density 1-2 years

## 2017-08-03 NOTE — Patient Instructions (Addendum)
Calcium citrate 600 mg twice a day with food Vitamin d 2000 units a day     Osteoporosis Osteoporosis is the thinning and loss of density in the bones. Osteoporosis makes the bones more brittle, fragile, and likely to break (fracture). Over time, osteoporosis can cause the bones to become so weak that they fracture after a simple fall. The bones most likely to fracture are the bones in the hip, wrist, and spine. What are the causes? The exact cause is not known. What increases the risk? Anyone can develop osteoporosis. You may be at greater risk if you have a family history of the condition or have poor nutrition. You may also have a higher risk if you are:  Female.  20 years old or older.  A smoker.  Not physically active.  White or Asian.  Slender.  What are the signs or symptoms? A fracture might be the first sign of the disease, especially if it results from a fall or injury that would not usually cause a bone to break. Other signs and symptoms include:  Low back and neck pain.  Stooped posture.  Height loss.  How is this diagnosed? To make a diagnosis, your health care provider may:  Take a medical history.  Perform a physical exam.  Order tests, such as: ? A bone mineral density test. ? A dual-energy X-ray absorptiometry test.  How is this treated? The goal of osteoporosis treatment is to strengthen your bones to reduce your risk of a fracture. Treatment may involve:  Making lifestyle changes, such as: ? Eating a diet rich in calcium. ? Doing weight-bearing and muscle-strengthening exercises. ? Stopping tobacco use. ? Limiting alcohol intake.  Taking medicine to slow the process of bone loss or to increase bone density.  Monitoring your levels of calcium and vitamin D.  Follow these instructions at home:  Include calcium and vitamin D in your diet. Calcium is important for bone health, and vitamin D helps the body absorb calcium.  Perform  weight-bearing and muscle-strengthening exercises as directed by your health care provider.  Do not use any tobacco products, including cigarettes, chewing tobacco, and electronic cigarettes. If you need help quitting, ask your health care provider.  Limit your alcohol intake.  Take medicines only as directed by your health care provider.  Keep all follow-up visits as directed by your health care provider. This is important.  Take precautions at home to lower your risk of falling, such as: ? Keeping rooms well lit and clutter free. ? Installing safety rails on stairs. ? Using rubber mats in the bathroom and other areas that are often wet or slippery. Get help right away if: You fall or injure yourself. This information is not intended to replace advice given to you by your health care provider. Make sure you discuss any questions you have with your health care provider. Document Released: 04/06/2005 Document Revised: 11/30/2015 Document Reviewed: 12/05/2013 Elsevier Interactive Patient Education  2018 [place].     Bone density scale: - normal: T-score -1.0 to + 1.0 - osteopenia/low bone density: T-score between -2.5 and -1.0 - osteoporosis: T-score below -2.5 - severe osteoporosis: T-score below -2.5 with history of fragility fracture

## 2017-08-10 ENCOUNTER — Telehealth: Payer: Self-pay | Admitting: Internal Medicine

## 2017-08-10 NOTE — Telephone Encounter (Signed)
Patient advised that I have submitted her information into insurance and I am still waiting to get summary of benefits back from [employer]---i'm not sure of her coverage at the moment, but I will call her to discuss as soon as I know what her plans covers/copay required

## 2017-08-10 NOTE — Telephone Encounter (Signed)
Copied from [place] [id number]. Topic: General - Other >> Aug 10, 2017 10:13 AM Lolita Rieger, RMA wrote: Reason for CRM: pt called and asked if prolia was covered because no one called her back regarding this after her visit [phone]

## 2017-08-22 ENCOUNTER — Ambulatory Visit (INDEPENDENT_AMBULATORY_CARE_PROVIDER_SITE_OTHER): Payer: BLUE CROSS/BLUE SHIELD | Admitting: *Deleted

## 2017-08-22 DIAGNOSIS — M810 Age-related osteoporosis without current pathological fracture: Secondary | ICD-10-CM

## 2017-08-22 MED ORDER — DENOSUMAB 60 MG/ML ~~LOC~~ SOLN
60.0000 mg | Freq: Once | SUBCUTANEOUS | Status: AC
  Administered 2017-08-22: 60 mg via SUBCUTANEOUS

## 2017-09-25 ENCOUNTER — Ambulatory Visit: Payer: BLUE CROSS/BLUE SHIELD

## 2017-10-10 ENCOUNTER — Ambulatory Visit (INDEPENDENT_AMBULATORY_CARE_PROVIDER_SITE_OTHER): Payer: BLUE CROSS/BLUE SHIELD

## 2017-10-10 DIAGNOSIS — Z23 Encounter for immunization: Secondary | ICD-10-CM | POA: Diagnosis not present

## 2017-10-10 NOTE — Progress Notes (Signed)
Injection given.   Vonnie Spagnolo J Gilda Abboud, MD  

## 2017-10-11 ENCOUNTER — Ambulatory Visit: Payer: BLUE CROSS/BLUE SHIELD

## 2017-11-10 ENCOUNTER — Other Ambulatory Visit: Payer: Self-pay | Admitting: Emergency Medicine

## 2017-11-10 MED ORDER — VALACYCLOVIR HCL 500 MG PO TABS: 500.0000 mg | ORAL_TABLET | Freq: Every day | ORAL | 1 refills | Status: DC

## 2017-11-10 MED ORDER — ESOMEPRAZOLE MAGNESIUM 40 MG PO CPDR: 40.0000 mg | DELAYED_RELEASE_CAPSULE | Freq: Every day | ORAL | 1 refills | Status: DC

## 2017-11-10 MED ORDER — LEVOTHYROXINE SODIUM 25 MCG PO TABS: 25.0000 ug | ORAL_TABLET | Freq: Every day | ORAL | 1 refills | Status: DC

## 2017-11-10 MED ORDER — ORLISTAT 120 MG PO CAPS: 120.0000 mg | ORAL_CAPSULE | Freq: Three times a day (TID) | ORAL | 1 refills | Status: DC

## 2017-12-12 LAB — HM MAMMOGRAPHY

## 2017-12-12 LAB — HM PAP SMEAR: HM Pap smear: NEGATIVE

## 2018-01-31 ENCOUNTER — Telehealth: Payer: Self-pay | Admitting: Internal Medicine

## 2018-01-31 NOTE — Telephone Encounter (Signed)
Copied from [place] [id number]. Topic: Quick Communication - Rx Refill/Question >> Jan 31, 2018 10:49 AM Oliver Pila B wrote: Medication: levothyroxine (SYNTHROID, LEVOTHROID) 25 MCG tablet [[id number]]  esomeprazole (NEXIUM) 40 MG capsule [[id number]]   Has the patient contacted their pharmacy? Yes.   (Agent: If no, request that the patient contact the pharmacy for the refill.) (Agent: If yes, when and what did the pharmacy advise?)  [facility] ([facility] phone [facility]   Agent: Please be advised that RX refills may take up to 3 business days. We ask that you follow-up with your pharmacy.

## 2018-02-01 NOTE — Telephone Encounter (Signed)
[facility] called to ask about the refills the patient has requested. [facility] says the medication was sent out on 01/25/18 and it says the patient received on 01/30/18, there are no more refills. [facility] says the refills can either be sent in now and the system will place on hold until it is time to refill or we can wait until they reach out for the refill. I advised we will wait until the next request comes in.

## 2018-02-06 ENCOUNTER — Telehealth: Payer: Self-pay | Admitting: Emergency Medicine

## 2018-02-06 NOTE — Telephone Encounter (Signed)
LVM with pt to call back and give office information for PAP, Mammo, and colonoscopy.

## 2018-02-06 NOTE — Telephone Encounter (Signed)
Copied from [place] [phone]. Topic: Inquiry >> Jan 31, 2018 10:45 AM Oliver Pila B wrote: Reason for CRM: pt called to ask to have a nurse to check out her health maintenance report; pt states she has had a mammogram and pap smear a couple of months ago; as well she has had a colonoscopy not too long ago as well and would like to have that info updated; contact pt to advise

## 2018-02-14 ENCOUNTER — Telehealth: Payer: Self-pay | Admitting: Internal Medicine

## 2018-02-14 NOTE — Telephone Encounter (Signed)
Copied from [place] [phone]. Topic: Quick Communication - See Telephone Encounter >> Feb 14, 2018  4:37 PM Rutherford Nail, NT wrote: CRM for notification. See Telephone encounter for: 02/14/18. Patient calling and states a PA is needed for orlistat (XENICAL) 120 MG [facility] Code expires [facility]/08/18 [facility]: [phone]

## 2018-02-16 NOTE — Telephone Encounter (Signed)
Patient called to check on status of medication refill.

## 2018-02-16 NOTE — Telephone Encounter (Signed)
Pt called asking if the medication had been authorized yet. I informed her of the turn around time. Please call pt when done.

## 2018-02-19 ENCOUNTER — Other Ambulatory Visit: Payer: Self-pay | Admitting: Emergency Medicine

## 2018-02-19 MED ORDER — ORLISTAT 120 MG PO CAPS: 120.0000 mg | ORAL_CAPSULE | Freq: Three times a day (TID) | ORAL | 2 refills | Status: DC

## 2018-02-19 NOTE — Telephone Encounter (Signed)
Patient is calling to update that she has a Prescription ID card-   RX bin # [phone] Rx PCN#: [id number] [phone]   Please advise. Call back number is [phone].

## 2018-02-19 NOTE — Telephone Encounter (Addendum)
Spoke with [employer]. Was given Key [id number]. PA completed and approved. Pt notified.   30 day RX sent to [facility] due to med being on back order with [facility].

## 2018-03-02 ENCOUNTER — Encounter: Payer: Self-pay | Admitting: Internal Medicine

## 2018-03-14 ENCOUNTER — Telehealth: Payer: Self-pay | Admitting: Internal Medicine

## 2018-03-14 MED ORDER — AZITHROMYCIN 250 MG PO TABS: ORAL_TABLET | ORAL | 0 refills | Status: DC

## 2018-03-14 MED ORDER — AMOXICILLIN 500 MG PO CAPS
500.0000 mg | ORAL_CAPSULE | Freq: Three times a day (TID) | ORAL | 0 refills | Status: DC
Start: 2018-03-14 — End: 2018-04-13

## 2018-03-14 NOTE — Telephone Encounter (Signed)
Copied from [place] [id number]. Topic: Quick Communication - Rx Refill/Question >> Mar 14, 2018  1:40 PM Alexander, [employer] L wrote: Medication: valACYclovir (VALTREX) 500 MG tablet levothyroxine (SYNTHROID, LEVOTHROID) 25 MCG tablet esomeprazole (NEXIUM) 40 MG capsule orlistat (XENICAL) 120 MG capsule ibandronate (BONIVA) 150 MG tablet  Has the patient contacted their pharmacy? No - pt is switching pharmacies due to insurance (Agent: If no, request that the patient contact the pharmacy for the refill.) (Agent: If yes, when and what did the pharmacy advise?)  Preferred Pharmacy (with phone number or street name): [id number] - [place], [address] [phone] (Phone) [phone] (Fax)  Agent: Please be advised that RX refills may take up to 3 business days. We ask that you follow-up with your pharmacy.

## 2018-03-14 NOTE — Telephone Encounter (Signed)
Remaining refills of Valcyclovir, Levothyroxine and Esomeprazole that were previously sent to [facility] were resent to [facility] as requested. Refills of Xenical are available at requested pharmacy.   Pt requesting refill of Ibandronate (Boniva) 150 mg tab which was previously filled by historical provider.  LOV:08/03/17 PCP: Dr. Quay Burow Pharmacy: [facility] [place]

## 2018-03-14 NOTE — Telephone Encounter (Signed)
Please advise 

## 2018-03-14 NOTE — Telephone Encounter (Signed)
Ok - sent to [facility]

## 2018-03-14 NOTE — Telephone Encounter (Signed)
Copied from [place] [phone]. Topic: Inquiry >> Mar 14, 2018  3:41 PM Berneta Levins wrote: CRM for notification. See Telephone encounter for: 03/14/18.  Pt calling - states she is getting ready to travel internationally for about a month and a half ([place]).  Pt would like to know if physician will write her a script for a Z-pack and amoxycillin so that she can have those with her if needed. Pt uses [facility] [place] [address] [phone] (Phone) [phone] (Fax) Pt can be reached at [phone]

## 2018-03-15 MED ORDER — IBANDRONATE SODIUM 150 MG PO TABS: ORAL_TABLET | ORAL | 0 refills | Status: DC

## 2018-03-15 NOTE — Telephone Encounter (Signed)
Pt aware.

## 2018-03-15 NOTE — Telephone Encounter (Signed)
Rx sent 

## 2018-03-19 ENCOUNTER — Ambulatory Visit: Payer: Self-pay | Admitting: Internal Medicine

## 2018-03-19 MED ORDER — PREDNISONE 10 MG PO TABS: ORAL_TABLET | ORAL | 0 refills | Status: DC

## 2018-03-19 MED ORDER — DOXYCYCLINE HYCLATE 100 MG PO TABS: 100.0000 mg | ORAL_TABLET | Freq: Two times a day (BID) | ORAL | 0 refills | Status: DC

## 2018-03-19 NOTE — Telephone Encounter (Signed)
Pt was diagnosed with sinusitis from an urgent care be cause she could not get in to see Dr. Quay Burow and she is leaving tomorrow to go out of the country and is afraid that when meds run out the symptoms will come Back and would like to know what she might take over the counter to take with them   Patient is requesting Dr Quay Burow call her if she can. Patient is leaving the country tomorrow and she wants to speak with her before she leaves.  Patient was seen at [facility] last week- she could not get appointment at office. She was treated with prednisone dose pack and Doxycycline for sinusitis. Patient states she felt good the first couple days- but she is still coughing and just wants advisement  before she leaves the country.  Patient is still taking her medications along with over the counter: Claritin, Musinex D, Saline nasal spray, Re colla cough drops. Patient can be reached at [phone]. [facility]  Reason for Disposition . [1] Caller requesting NON-URGENT health information AND [2] PCP's office is the best resource  Answer Assessment - Initial Assessment Questions 1. REASON FOR CALL or QUESTION: "What is your reason for calling today?" or "How can I best help you?" or "What question do you have that I can help answer?"     Patient would like for her PCP to call her - she is leaving country tomorrow and she is just recovering from sinusitis. She wants to speak to her before she leaves.  Protocols used: INFORMATION ONLY CALL-A-AH

## 2018-03-19 NOTE — Addendum Note (Signed)
Addended by: [name] on: 03/19/2018 04:52 PM   Modules accepted: Orders

## 2018-03-19 NOTE — Telephone Encounter (Signed)
Called and spoke with [place].    Dx sinusitis - on prednisone 60 mg - taper by 10 daily.  She is also on doxy.   Will prescribe doxy for additional 7 days and an additional prednisone taper

## 2018-03-19 NOTE — Telephone Encounter (Signed)
Started taking prednisone and doxycycline starting on Friday. She is some better but not fully better. Pt would like to know if an antibiotics can be sent incase her sxs get worse or come back while she is in [place]. Please advise.

## 2018-04-12 ENCOUNTER — Telehealth: Payer: Self-pay | Admitting: Internal Medicine

## 2018-04-12 NOTE — Telephone Encounter (Signed)
Ok to write letter

## 2018-04-12 NOTE — Telephone Encounter (Signed)
Copied from [place] [phone]. Topic: Inquiry >> Apr 12, 2018  2:05 PM Oliver Pila B wrote: Reason for CRM: pt called b/c she is traveling to [place] and the country is very strict on what they allow in the country; pt is needing a letter emailed to her stating the Dr. Quay Burow is her Doctor and the list of medications that are prescribed by her; contact pt if any more clarity is needed but please email letter to: [email or web address]; if not able contact pt to advise

## 2018-04-13 NOTE — Telephone Encounter (Signed)
Letter state that "Dr. Quay Burow is her provider and theses are the medications that she has prescribed the patient, [name].."  and her medication list on the letter.   Patient is going to call back with the fax number.

## 2018-04-13 NOTE — Telephone Encounter (Signed)
Letter has been faxed over to number below.

## 2018-04-13 NOTE — Telephone Encounter (Signed)
Pt calling back with a corrected fax number. Fax#: [phone]. Attn: Denton Lank. Needs to be faxed by 4pm.

## 2018-04-13 NOTE — Telephone Encounter (Signed)
Patient returned call with fax number..   Fax# [phone]  Attn: Blanche [place]

## 2018-04-13 NOTE — Telephone Encounter (Signed)
Letter refaxed 

## 2018-04-13 NOTE — Telephone Encounter (Signed)
LVM for pt to call back.

## 2018-04-15 DIAGNOSIS — L03211 Cellulitis of face: Secondary | ICD-10-CM | POA: Diagnosis not present

## 2018-04-16 ENCOUNTER — Other Ambulatory Visit: Payer: Self-pay | Admitting: Internal Medicine

## 2018-05-29 ENCOUNTER — Other Ambulatory Visit: Payer: Self-pay | Admitting: Internal Medicine

## 2018-05-29 NOTE — Telephone Encounter (Signed)
Copied from [place] [phone]. Topic: Quick Communication - Rx Refill/Question >> May 29, 2018  8:58 AM Ahmed Prima L wrote: Medication:  valACYclovir (VALTREX) 500 MG tablet ( this is the only one she is currently out of ) Patient states she went on medicare in October and [facility] no [facility]. She needs all her medications sent to [facility], [place] [address].  levothyroxine (SYNTHROID, LEVOTHROID) 25 MCG tablet esomeprazole (NEXIUM) 40 MG capsule loratadine (CLARITIN) 10 MG tablet ( wants prescription for this now instead of getting OTC ) She wants these sent in as new scripts so she can just call when she needs them. Thanks   Has the patient contacted their pharmacy? no (Agent: If no, request that the patient contact the pharmacy for the refill.) (Agent: If yes, when and what did the pharmacy advise?)  Preferred Pharmacy (with phone number or street name): [id number] - [place], [place]: Please be advised that RX refills may take up to 3 business days. We ask that you follow-up with your pharmacy.

## 2018-05-30 ENCOUNTER — Ambulatory Visit (INDEPENDENT_AMBULATORY_CARE_PROVIDER_SITE_OTHER): Payer: Medicare Other

## 2018-05-30 DIAGNOSIS — Z23 Encounter for immunization: Secondary | ICD-10-CM

## 2018-05-30 MED ORDER — LEVOTHYROXINE SODIUM 25 MCG PO TABS: 25.0000 ug | ORAL_TABLET | Freq: Every day | ORAL | 0 refills | Status: DC

## 2018-05-30 MED ORDER — ESOMEPRAZOLE MAGNESIUM 40 MG PO CPDR: 40.0000 mg | DELAYED_RELEASE_CAPSULE | Freq: Every day | ORAL | 0 refills | Status: DC

## 2018-05-30 MED ORDER — VALACYCLOVIR HCL 500 MG PO TABS: 500.0000 mg | ORAL_TABLET | Freq: Every day | ORAL | 1 refills | Status: DC

## 2018-05-30 NOTE — Telephone Encounter (Signed)
Requested medication (s) are due for refill today: yes  Requested medication (s) are on the active medication list: yes  Last refill:  Previously filled by historical provider  Future visit scheduled: yes  Notes to clinic:  Medication last filled by historical provider    Requested Prescriptions  Pending Prescriptions Disp Refills   loratadine (CLARITIN) 10 MG tablet      Sig: Take by mouth.     Ear, Nose, and Throat:  Antihistamines Passed - 05/30/2018 12:37 PM      Passed - Valid encounter within last 12 months    Recent Outpatient Visits          10 months ago Age related osteoporosis, unspecified pathological fracture presence   [place], MD   11 months ago Preventative health care   [place], Claudina Lick, MD   1 year ago Rheumatoid arthritis, involving unspecified site, unspecified rheumatoid factor presence ([name])   [place], MD      Future Appointments            In 1 month [place], Claudina Lick, MD [place], [place]         Signed Prescriptions Disp Refills   valACYclovir (VALTREX) 500 MG tablet 90 tablet 1    Sig: Take 1 tablet (500 mg total) by mouth daily.     Antimicrobials:  Antiviral Agents - Anti-Herpetic Passed - 05/30/2018 12:37 PM      Passed - Valid encounter within last 12 months    Recent Outpatient Visits          10 months ago Age related osteoporosis, unspecified pathological fracture presence   [place], MD   11 months ago Preventative health care   [place], Claudina Lick, MD   1 year ago Rheumatoid arthritis, involving unspecified site, unspecified rheumatoid factor presence ([place])   [employer], MD      Future Appointments            In 1 month Burns, Claudina Lick, MD [place], [facility]          esomeprazole (NEXIUM) 40 MG capsule 90 capsule 0    Sig: Take 1 capsule (40 mg total) by mouth daily.     Gastroenterology: Proton Pump Inhibitors Passed - 05/30/2018 12:37 PM      Passed - Valid encounter within last 12 months    Recent Outpatient Visits          10 months ago Age related osteoporosis, unspecified pathological fracture presence   [place], MD   11 months ago Preventative health care   [place], Claudina Lick, MD   1 year ago Rheumatoid arthritis, involving unspecified site, unspecified rheumatoid factor presence ([place])   [place], MD      Future Appointments            In 1 month Burns, Claudina Lick, MD [place], [facility]          levothyroxine (SYNTHROID, LEVOTHROID) 25 MCG tablet 90 tablet 0    Sig: Take 1 tablet (25 mcg total) by mouth daily.     Endocrinology:  Hypothyroid Agents Failed - 05/30/2018 12:37 PM  Failed - TSH needs to be rechecked within 3 months after an abnormal result. Refill until TSH is due.      Passed - TSH in normal range and within 360 days    TSH  Date Value Ref Range Status  06/22/2017 2.09 0.35 - 4.50 uIU/mL Final         Passed - Valid encounter within last 12 months    Recent Outpatient Visits          10 months ago Age related osteoporosis, unspecified pathological fracture presence   [place], MD   11 months ago Preventative health care   [place], Claudina Lick, MD   1 year ago Rheumatoid arthritis, involving unspecified site, unspecified rheumatoid factor presence ([place])   [place], MD      Future Appointments            In 1 month Burns, Claudina Lick, MD [place], [facility]

## 2018-05-31 MED ORDER — LORATADINE 10 MG PO TABS: ORAL_TABLET | ORAL | 0 refills | Status: DC

## 2018-06-18 DIAGNOSIS — M0579 Rheumatoid arthritis with rheumatoid factor of multiple sites without organ or systems involvement: Secondary | ICD-10-CM | POA: Diagnosis not present

## 2018-07-02 DIAGNOSIS — M0579 Rheumatoid arthritis with rheumatoid factor of multiple sites without organ or systems involvement: Secondary | ICD-10-CM | POA: Diagnosis not present

## 2018-07-16 NOTE — Patient Instructions (Addendum)
[name] , Thank you for taking time to come for your Medicare Wellness Visit. I appreciate your ongoing commitment to your health goals. Please review the following plan we discussed and let me know if I can assist you in the future.   These are the goals we discussed: Goals   Increase exercise, decrease portions     This is a list of the screening recommended for you and due dates:  Health Maintenance  Topic Date Due  . HIV Screening  04/[birth year]/1968  . Pneumonia vaccines (1 of 2 - PCV13) 04/22/2018  . Mammogram  12/13/2019  . Colon Cancer Screening  05/23/2021  . Tetanus Vaccine  10/18/2021  . Pap Smear  12/13/2022  . Flu Shot  Completed  . DEXA scan (bone density measurement)  Completed  .  Hepatitis C: One time screening is recommended by Center for Disease Control  (CDC) for  adults born from [birth year] through 1963.   Completed     [facility], [facility], [facility]    Tests ordered today. Your results will be released to [place] (or called to you) after review, usually within 72hours after test completion. If any changes need to be made, you will be notified at that same time.  All other Health Maintenance issues reviewed.   All recommended immunizations and age-appropriate screenings are up-to-date or discussed.  prevnar immunization administered today.   Medications reviewed and updated.  Changes include :   Switch the nexium to the evening.  Add pepcid in the morning.    A dexa scan was ordered.     A referral was ordered for cardiology.  Please followup in one year    Health Maintenance, Female Adopting a healthy lifestyle and getting preventive care can go a long way to promote health and wellness. Talk with your health care provider about what schedule of regular examinations is right for you. This is a good chance for you to check in with your provider about disease prevention and staying healthy. In between checkups, there are  plenty of things you can do on your own. Experts have done a lot of research about which lifestyle changes and preventive measures are most likely to keep you healthy. Ask your health care provider for more information. Weight and diet Eat a healthy diet  Be sure to include plenty of vegetables, fruits, low-fat dairy products, and lean protein.  Do not eat a lot of foods high in solid fats, added sugars, or salt.  Get regular exercise. This is one of the most important things you can do for your health. ? Most adults should exercise for at least 150 minutes each week. The exercise should increase your heart rate and make you sweat (moderate-intensity exercise). ? Most adults should also do strengthening exercises at least twice a week. This is in addition to the moderate-intensity exercise. Maintain a healthy weight  Body mass index (BMI) is a measurement that can be used to identify possible weight problems. It estimates body fat based on height and weight. Your health care provider can help determine your BMI and help you achieve or maintain a healthy weight.  For females 49 years of age and older: ? A BMI below 18.5 is considered underweight. ? A BMI of 18.5 to 24.9 is normal. ? A BMI of 25 to 29.9 is considered overweight. ? A BMI of 30 and above is considered obese. Watch levels of cholesterol and blood lipids  You should start  having your blood tested for lipids and cholesterol at 66 years of age, then have this test every 5 years.  You may need to have your cholesterol levels checked more often if: ? Your lipid or cholesterol levels are high. ? You are older than 66 years of age. ? You are at high risk for heart disease. Cancer screening Lung Cancer  Lung cancer screening is recommended for adults 23-46 years old who are at high risk for lung cancer because of a history of smoking.  A yearly low-dose CT scan of the lungs is recommended for people who: ? Currently  smoke. ? Have quit within the past 15 years. ? Have at least a 30-pack-year history of smoking. A pack year is smoking an average of one pack of cigarettes a day for 1 year.  Yearly screening should continue until it has been 15 years since you quit.  Yearly screening should stop if you develop a health problem that would prevent you from having lung cancer treatment. Breast Cancer  Practice breast self-awareness. This means understanding how your breasts normally appear and feel.  It also means doing regular breast self-exams. Let your health care provider know about any changes, no matter how small.  If you are in your 20s or 30s, you should have a clinical breast exam (CBE) by a health care provider every 1-3 years as part of a regular health exam.  If you are 31 or older, have a CBE every year. Also consider having a breast X-ray (mammogram) every year.  If you have a family history of breast cancer, talk to your health care provider about genetic screening.  If you are at high risk for breast cancer, talk to your health care provider about having an MRI and a mammogram every year.  Breast cancer gene (BRCA) assessment is recommended for women who have family members with BRCA-related cancers. BRCA-related cancers include: ? Breast. ? Ovarian. ? Tubal. ? Peritoneal cancers.  Results of the assessment will determine the need for genetic counseling and BRCA1 and BRCA2 testing. Cervical Cancer Your health care provider may recommend that you be screened regularly for cancer of the pelvic organs (ovaries, uterus, and vagina). This screening involves a pelvic examination, including checking for microscopic changes to the surface of your cervix (Pap test). You may be encouraged to have this screening done every 3 years, beginning at age 66.  For women ages 14-65, health care providers may recommend pelvic exams and Pap testing every 3 years, or they may recommend the Pap and pelvic exam,  combined with testing for human papilloma virus (HPV), every 5 years. Some types of HPV increase your risk of cervical cancer. Testing for HPV may also be done on women of any age with unclear Pap test results.  Other health care providers may not recommend any screening for nonpregnant women who are considered low risk for pelvic cancer and who do not have symptoms. Ask your health care provider if a screening pelvic exam is right for you.  If you have had past treatment for cervical cancer or a condition that could lead to cancer, you need Pap tests and screening for cancer for at least 20 years after your treatment. If Pap tests have been discontinued, your risk factors (such as having a new sexual partner) need to be reassessed to determine if screening should resume. Some women have medical problems that increase the chance of getting cervical cancer. In these cases, your health care provider may  recommend more frequent screening and Pap tests. Colorectal Cancer  This type of cancer can be detected and often prevented.  Routine colorectal cancer screening usually begins at 66 years of age and continues through 66 years of age.  Your health care provider may recommend screening at an earlier age if you have risk factors for colon cancer.  Your health care provider may also recommend using home test kits to check for hidden blood in the stool.  A small camera at the end of a tube can be used to examine your colon directly (sigmoidoscopy or colonoscopy). This is done to check for the earliest forms of colorectal cancer.  Routine screening usually begins at age 55.  Direct examination of the colon should be repeated every 5-10 years through 66 years of age. However, you may need to be screened more often if early forms of precancerous polyps or small growths are found. Skin Cancer  Check your skin from head to toe regularly.  Tell your health care provider about any new moles or changes in  moles, especially if there is a change in a mole's shape or color.  Also tell your health care provider if you have a mole that is larger than the size of a pencil eraser.  Always use sunscreen. Apply sunscreen liberally and repeatedly throughout the day.  Protect yourself by wearing long sleeves, pants, a wide-brimmed hat, and sunglasses whenever you are outside. Heart disease, diabetes, and high blood pressure  High blood pressure causes heart disease and increases the risk of stroke. High blood pressure is more likely to develop in: ? People who have blood pressure in the high end of the normal range (130-139/85-89 mm Hg). ? People who are overweight or obese. ? People who are African American.  If you are 64-62 years of age, have your blood pressure checked every 3-5 years. If you are 1 years of age or older, have your blood pressure checked every year. You should have your blood pressure measured twice-once when you are at a hospital or clinic, and once when you are not at a hospital or clinic. Record the average of the two measurements. To check your blood pressure when you are not at a hospital or clinic, you can use: ? An automated blood pressure machine at a pharmacy. ? A home blood pressure monitor.  If you are between 82 years and 9 years old, ask your health care provider if you should take aspirin to prevent strokes.  Have regular diabetes screenings. This involves taking a blood sample to check your fasting blood sugar level. ? If you are at a normal weight and have a low risk for diabetes, have this test once every three years after 66 years of age. ? If you are overweight and have a high risk for diabetes, consider being tested at a younger age or more often. Preventing infection Hepatitis B  If you have a higher risk for hepatitis B, you should be screened for this virus. You are considered at high risk for hepatitis B if: ? You were born in a country where hepatitis B  is common. Ask your health care provider which countries are considered high risk. ? Your parents were born in a high-risk country, and you have not been immunized against hepatitis B (hepatitis B vaccine). ? You have HIV or AIDS. ? You use needles to inject street drugs. ? You live with someone who has hepatitis B. ? You have had sex with someone  who has hepatitis B. ? You get hemodialysis treatment. ? You take certain medicines for conditions, including cancer, organ transplantation, and autoimmune conditions. Hepatitis C  Blood testing is recommended for: ? Everyone born from [birth year] through 1963. ? Anyone with known risk factors for hepatitis C. Sexually transmitted infections (STIs)  You should be screened for sexually transmitted infections (STIs) including gonorrhea and chlamydia if: ? You are sexually active and are younger than 66 years of age. ? You are older than 66 years of age and your health care provider tells you that you are at risk for this type of infection. ? Your sexual activity has changed since you were last screened and you are at an increased risk for chlamydia or gonorrhea. Ask your health care provider if you are at risk.  If you do not have HIV, but are at risk, it may be recommended that you take a prescription medicine daily to prevent HIV infection. This is called pre-exposure prophylaxis (PrEP). You are considered at risk if: ? You are sexually active and do not regularly use condoms or know the HIV status of your partner(s). ? You take drugs by injection. ? You are sexually active with a partner who has HIV. Talk with your health care provider about whether you are at high risk of being infected with HIV. If you choose to begin PrEP, you should first be tested for HIV. You should then be tested every 3 months for as long as you are taking PrEP. Pregnancy  If you are premenopausal and you may become pregnant, ask your health care provider about preconception  counseling.  If you may become pregnant, take 400 to 800 micrograms (mcg) of folic acid every day.  If you want to prevent pregnancy, talk to your health care provider about birth control (contraception). Osteoporosis and menopause  Osteoporosis is a disease in which the bones lose minerals and strength with aging. This can result in serious bone fractures. Your risk for osteoporosis can be identified using a bone density scan.  If you are 84 years of age or older, or if you are at risk for osteoporosis and fractures, ask your health care provider if you should be screened.  Ask your health care provider whether you should take a calcium or vitamin D supplement to lower your risk for osteoporosis.  Menopause may have certain physical symptoms and risks.  Hormone replacement therapy may reduce some of these symptoms and risks. Talk to your health care provider about whether hormone replacement therapy is right for you. Follow these instructions at home:  Schedule regular health, dental, and eye exams.  Stay current with your immunizations.  Do not use any tobacco products including cigarettes, chewing tobacco, or electronic cigarettes.  If you are pregnant, do not drink alcohol.  If you are breastfeeding, limit how much and how often you drink alcohol.  Limit alcohol intake to no more than 1 drink per day for nonpregnant women. One drink equals 12 ounces of beer, 5 ounces of wine, or 1 ounces of hard liquor.  Do not use street drugs.  Do not share needles.  Ask your health care provider for help if you need support or information about quitting drugs.  Tell your health care provider if you often feel depressed.  Tell your health care provider if you have ever been abused or do not feel safe at home. This information is not intended to replace advice given to you by your health care provider. Make  sure you discuss any questions you have with your health care provider. Document  Released: 01/10/2011 Document Revised: 12/03/2015 Document Reviewed: 03/31/2015 Elsevier Interactive Patient Education  2019 [employer].

## 2018-07-16 NOTE — Progress Notes (Signed)
Subjective:    Patient ID: Julia [name], female    DOB: 31-Dec-1950, 68 y.o.   MRN: [id number]  HPI Here for medicare wellness exam sand follow up of her chronic medical problems.     I have personally reviewed and have noted 1.The patient's medical and social history 2.Their use of alcohol, tobacco or illicit drugs 3.Their current medications and supplements 4.The patient's functional ability including ADL's, fall risks, home                 safety risk and hearing or visual impairment. 5.Diet and physical activities 6.Evidence for depression or mood disorders 7.Care team reviewed  -  [facility] - Dr Amil Amen, [address] [facility]  Chest pressure: she has intermittent tightening in her chest and pain in her upper back.  It started about one month ago - six weeks ago.  It is getting more frequent.    GERD:  She is taking her medication daily as prescribed.  Her gerd has always been very well controlled and if she has had GERD in the past she was able to control it with tums.  She states frequent reflux over the past few weeks and she can not control it with Tums.      OP:  She is taking calcium and vitamin d daily.  She is getting prolia.  She is active, but not doing regimented exercise.  She plans on starting to walk.   RA;  She follows with Dr Amil Amen.  Her joint pain is well controlled.   Hypothyroidism:  She is taking her medication daily.  She denies any recent changes in energy or weight that are unexplained.    Are there smokers in your home (other than you)? No  Risk Factors Exercise:  Active, does some walking Dietary issues discussed:   Eats healthy, well balanced, avoids high fat and heavy foods  Vitamin and supplement use:   Calcium and vitamin d, glucosamine-chondroitin, MVI, krill oil, omega, B6, tumeric   Opiod use:  N/A   Cardiac risk factors: advanced age, hypertension and obesity    Depression  Screen  Have you felt down, depressed or hopeless? No  Have you felt little interest or pleasure in doing things?  No  Activities of Daily Living In your present state of health, do you have any difficulty performing the following activities?:  Driving? No Managing money?  No Feeding yourself? No Getting from bed to chair? No Climbing a flight of stairs? No Preparing food and eating?: No Bathing or showering? No Getting dressed: No Getting to/using the toilet? No Moving around from place to place: No In the past year have you fallen or had a near fall?: No   Do you have more than one partner?  No  Hearing Difficulties: No Do you often ask people to speak up or repeat themselves? No Do you experience ringing or noises in your ears? No Do you have difficulty understanding soft or whispered voices? No Vision:              Any change in vision:   none             Up to date with eye exam:   Will schedule  Memory:  Do you feel that you have a problem with memory? No  Do you often misplace items? No  Do you feel safe at home?  Yes  Cognitive Testing  Alert, Orientated? Yes  Normal Appearance? Yes  Recall of three  objects?  Yes  Can perform simple calculations? Yes  Displays appropriate judgment? Yes  Can read the correct time from a watch face? Yes   Advanced Directives have been discussed with the patient? Yes - in place    Medications and allergies reviewed with patient and updated if appropriate.  Patient Active Problem List   Diagnosis Date Noted  . Chest tightness 07/17/2018  . Vitamin D deficiency 06/22/2017  . Hypothyroidism 01/14/2017  . Altered sensation, foot 01/14/2017  . History of tuberculosis exposure 01/14/2017  . Osteoporosis 01/14/2017  . History of gastric bypass 01/14/2017  . History of colon polyps 01/14/2017  . Rheumatoid arthritis ([place]) 01/13/2017  . GERD (gastroesophageal reflux disease) 01/13/2017    Current Outpatient Medications on File  Prior to Visit  Medication Sig Dispense Refill  . Calcium Carbonate-Vitamin D 600-200 MG-UNIT TABS Take by mouth 2 (two) times daily.    . Cholecalciferol (VITAMIN D3) 1000 units CAPS Take by mouth.    . esomeprazole (NEXIUM) 40 MG capsule Take 1 capsule (40 mg total) by mouth daily. 90 capsule 0  . glucosamine-chondroitin 500-400 MG tablet Take 1 tablet by mouth 2 (two) times daily.     Marland Kitchen KRILL OIL PO Take 1,085 mg by mouth.    . levothyroxine (SYNTHROID, LEVOTHROID) 25 MCG tablet Take 1 tablet (25 mcg total) by mouth daily. 90 tablet 0  . loratadine (CLARITIN) 10 MG tablet Take 1 tablet daily. 90 tablet 0  . Multiple Vitamins-Minerals (THERA-M) TABS Take by mouth.    . Omega-3 Fatty Acids (FISH OIL) 1000 MG CAPS Take by mouth.    . orlistat (ALLI) 60 MG capsule Take 60 mg by mouth 3 (three) times daily with meals.    Marland Kitchen PROAIR HFA 108 (90 Base) MCG/ACT inhaler inhale 2 puffs by mouth every 4 hours if needed for wheezing  0  . Pseudoephedrine-guaiFENesin (MUCINEX D PO) Take 600 mg by mouth.    . pyridoxine (B-6) 100 MG tablet Take 100 mg by mouth 2 (two) times daily.    . riTUXimab (RITUXAN) 500 MG/50ML injection Inject into the vein.    . TURMERIC CURCUMIN PO Take 500 mg by mouth 2 (two) times daily.     . valACYclovir (VALTREX) 500 MG tablet Take 1 tablet (500 mg total) by mouth daily. 90 tablet 1   No current facility-administered medications on file prior to visit.     Past Medical History:  Diagnosis Date  . H/O breast augmentation   . History of gastric bypass   . Pelvis fracture ([place])    in 30's    Past Surgical History:  Procedure Laterality Date  . APPENDECTOMY    . TONSILLECTOMY      Social History   Socioeconomic History  . Marital status: Married    Spouse name: Not on file  . Number of children: Not on file  . Years of education: Not on file  . Highest education level: Not on file  Occupational History  . Not on file  Social Needs  . Financial resource  strain: Not on file  . Food insecurity:    Worry: Not on file    Inability: Not on file  . Transportation needs:    Medical: Not on file    Non-medical: Not on file  Tobacco Use  . Smoking status: Never Smoker  . Smokeless tobacco: Never Used  Substance and Sexual Activity  . Alcohol use: Yes  . Drug use: No  . Sexual activity:  Not on file  Lifestyle  . Physical activity:    Days per week: Not on file    Minutes per session: Not on file  . Stress: Not on file  Relationships  . Social connections:    Talks on phone: Not on file    Gets together: Not on file    Attends religious service: Not on file    Active member of club or organization: Not on file    Attends meetings of clubs or organizations: Not on file    Relationship status: Not on file  Other Topics Concern  . Not on file  Social History Narrative   Retired 07/10/17 from [employer]   No regular exercise    Family History  Problem Relation Age of Onset  . Hypertension Mother   . Heart disease Father   . Hypertension Father     Review of Systems  Constitutional: Negative for chills and fever.  HENT: Positive for postnasal drip.   Eyes: Negative for visual disturbance.  Respiratory: Negative for cough, shortness of breath and wheezing.   Cardiovascular: Positive for chest pain (tightness). Negative for palpitations.  Gastrointestinal: Negative for abdominal pain, blood in stool, constipation, diarrhea and nausea.  Genitourinary: Negative for dysuria and hematuria.  Musculoskeletal: Negative for arthralgias.  Skin: Negative for color change and rash.  Neurological: Negative for light-headedness and headaches.  Psychiatric/Behavioral: Negative for dysphoric mood. The patient is not nervous/anxious.        Objective:   Vitals:   07/17/18 1447  BP: (!) 142/80  Pulse: 73  Resp: 16  Temp: 98 F (36.7 C)  SpO2: 99%   Filed Weights   07/17/18 1447  Weight: 182 lb (82.6 kg)   Body mass index is 31.24  kg/m.  BP Readings from Last 3 Encounters:  07/17/18 (!) 142/80  08/03/17 134/82  06/22/17 124/84    Wt Readings from Last 3 Encounters:  07/17/18 182 lb (82.6 kg)  08/03/17 181 lb (82.1 kg)  06/22/17 183 lb (83 kg)     Physical Exam Constitutional: She appears well-developed and well-nourished. No distress.  HENT:  Head: Normocephalic and atraumatic.  Right Ear: External ear normal. Normal ear canal and TM Left Ear: External ear normal.  Normal ear canal and TM Mouth/Throat: Oropharynx is clear and moist.  Eyes: Conjunctivae and EOM are normal.  Neck: Neck supple. No tracheal deviation present. No thyromegaly present.  No carotid bruit  Cardiovascular: Normal rate, regular rhythm and normal heart sounds.   No murmur heard.  No edema. Pulmonary/Chest: Effort normal and breath sounds normal. No respiratory distress. She has no wheezes. She has no rales.  Breast: deferred to Gyn Abdominal: Soft. She exhibits no distension. There is no tenderness.  Lymphadenopathy: She has no cervical adenopathy.  Skin: Skin is warm and dry. She is not diaphoretic.  Psychiatric: She has a normal mood and affect. Her behavior is normal.        Assessment & Plan:   Wellness Exam: Immunizations   prevnar today, others up to date Colonoscopy  Up to date  Mammogram   Up to date  Dexa  Up to date  Gyn    Up to date  Eye exam   Due - she will schedule Hearing loss   none Memory concerns/difficulties   none Independent of ADLs    - fully independent EKG today  - EKG today - SR at 73 bpm, RSR, no acute changes, normal EKG - no EKG on file  for comparison Stressed the importance of regular exercise - walking   Patient received copy of preventative screening tests/immunizations recommended for the next 5-10 years.   See Problem List for Assessment and Plan of chronic medical problems.

## 2018-07-17 ENCOUNTER — Other Ambulatory Visit (INDEPENDENT_AMBULATORY_CARE_PROVIDER_SITE_OTHER): Payer: Medicare Other

## 2018-07-17 ENCOUNTER — Encounter

## 2018-07-17 ENCOUNTER — Ambulatory Visit (INDEPENDENT_AMBULATORY_CARE_PROVIDER_SITE_OTHER): Payer: Medicare Other | Admitting: Internal Medicine

## 2018-07-17 ENCOUNTER — Encounter: Payer: Self-pay | Admitting: Internal Medicine

## 2018-07-17 VITALS — BP 142/80 | HR 73 | Temp 98.0°F | Resp 16 | Ht 64.0 in | Wt 182.0 lb

## 2018-07-17 DIAGNOSIS — Z0000 Encounter for general adult medical examination without abnormal findings: Secondary | ICD-10-CM | POA: Diagnosis not present

## 2018-07-17 DIAGNOSIS — E039 Hypothyroidism, unspecified: Secondary | ICD-10-CM | POA: Diagnosis not present

## 2018-07-17 DIAGNOSIS — R0789 Other chest pain: Secondary | ICD-10-CM | POA: Insufficient documentation

## 2018-07-17 DIAGNOSIS — Z23 Encounter for immunization: Secondary | ICD-10-CM | POA: Diagnosis not present

## 2018-07-17 DIAGNOSIS — R202 Paresthesia of skin: Secondary | ICD-10-CM | POA: Insufficient documentation

## 2018-07-17 DIAGNOSIS — M810 Age-related osteoporosis without current pathological fracture: Secondary | ICD-10-CM

## 2018-07-17 DIAGNOSIS — E559 Vitamin D deficiency, unspecified: Secondary | ICD-10-CM

## 2018-07-17 DIAGNOSIS — M069 Rheumatoid arthritis, unspecified: Secondary | ICD-10-CM

## 2018-07-17 DIAGNOSIS — K219 Gastro-esophageal reflux disease without esophagitis: Secondary | ICD-10-CM

## 2018-07-17 HISTORY — DX: Paresthesia of skin: R20.2

## 2018-07-17 LAB — CBC WITH DIFFERENTIAL/PLATELET
BASOS ABS: 0.3 10*3/uL — AB (ref 0.0–0.1)
Basophils Relative: 4.7 % — ABNORMAL HIGH (ref 0.0–3.0)
Eosinophils Absolute: 0.2 10*3/uL (ref 0.0–0.7)
Eosinophils Relative: 3.8 % (ref 0.0–5.0)
HEMATOCRIT: 41.5 % (ref 36.0–46.0)
Hemoglobin: 13.9 g/dL (ref 12.0–15.0)
Lymphocytes Relative: 21.8 % (ref 12.0–46.0)
Lymphs Abs: 1.4 10*3/uL (ref 0.7–4.0)
MCHC: 33.6 g/dL (ref 30.0–36.0)
MCV: 96 fl (ref 78.0–100.0)
Monocytes Absolute: 0.9 10*3/uL (ref 0.1–1.0)
Monocytes Relative: 14.1 % — ABNORMAL HIGH (ref 3.0–12.0)
Neutro Abs: 3.6 10*3/uL (ref 1.4–7.7)
Neutrophils Relative %: 55.6 % (ref 43.0–77.0)
Platelets: 309 10*3/uL (ref 150.0–400.0)
RBC: 4.32 Mil/uL (ref 3.87–5.11)
RDW: 13.4 % (ref 11.5–15.5)
WBC: 6.5 10*3/uL (ref 4.0–10.5)

## 2018-07-17 LAB — COMPREHENSIVE METABOLIC PANEL
ALT: 15 U/L (ref 0–35)
AST: 21 U/L (ref 0–37)
Albumin: 4.5 g/dL (ref 3.5–5.2)
Alkaline Phosphatase: 50 U/L (ref 39–117)
BUN: 13 mg/dL (ref 6–23)
CO2: 27 meq/L (ref 19–32)
Calcium: 9.9 mg/dL (ref 8.4–10.5)
Chloride: 101 mEq/L (ref 96–112)
Creatinine, Ser: 0.56 mg/dL (ref 0.40–1.20)
GFR: 115.39 mL/min (ref >60.00)
GLUCOSE: 91 mg/dL (ref 70–99)
Potassium: 3.6 mEq/L (ref 3.5–5.1)
Sodium: 138 mEq/L (ref 135–145)
Total Bilirubin: 0.7 mg/dL (ref 0.2–1.2)
Total Protein: 7.1 g/dL (ref 6.0–8.3)

## 2018-07-17 LAB — LIPID PANEL
Cholesterol: 200 mg/dL (ref 0–200)
HDL: 94.2 mg/dL (ref >39.00)
LDL Cholesterol: 89 mg/dL (ref 0–99)
NONHDL: 105.89
Total CHOL/HDL Ratio: 2
Triglycerides: 86 mg/dL (ref 0.0–149.0)
VLDL: 17.2 mg/dL (ref 0.0–40.0)

## 2018-07-17 LAB — VITAMIN D 25 HYDROXY (VIT D DEFICIENCY, FRACTURES): VITD: 46.49 ng/mL (ref 30.00–100.00)

## 2018-07-17 LAB — TSH: TSH: 1.73 u[IU]/mL (ref 0.35–4.50)

## 2018-07-17 NOTE — Assessment & Plan Note (Signed)
Not controlled - having daily symptoms Change nexium to evening Start pepcid 20 mg qd - bid Work on weight loss GERD diet

## 2018-07-17 NOTE — Assessment & Plan Note (Signed)
Ck vitamin d level 

## 2018-07-17 NOTE — Assessment & Plan Note (Signed)
Management per Dr Amil Amen Well controlled

## 2018-07-17 NOTE — Assessment & Plan Note (Signed)
Clinically euthyroid Check tsh  Titrate med dose if needed  

## 2018-07-17 NOTE — Assessment & Plan Note (Addendum)
On prolia Taking calcium / vitamin d Active - will start walking regularly - stressed regular exercise

## 2018-07-17 NOTE — Assessment & Plan Note (Signed)
Having intermittent chest tightness x 1 month - getting worse - had some discomfort today at our visit Concern for possible cardiac cause Also having increased GERD  Cardiac calcium ct 5-6 years ago 0 Will refer to cardio for evaluation EKG today - SR at 73 bpm, RSR, no acute changes, normal EKG - no EKG on file for comparison

## 2018-07-18 NOTE — Progress Notes (Deleted)
Cardiology Office Note   Date:  07/18/2018   ID:  Teka Chanda, DOB 04-17-1951, MRN [id number]  PCP:  [name], MD  Cardiologist:   Jenkins Rouge, MD   No chief complaint on file.     History of Present Illness: Julia [name] is a 68 y.o. female who presents for consultation regarding chest pain.  History of breast augmentation and gastric bypass.  Also hypothyroidism and RA. Referred by Dr Quay Burow reviewed her note from 07/17/18. Complained of GERD nexium changed to evening and pepcid 20 bid added. Having intermittent chest tightness for a month. Progressive atypical and non exertional. Had pain while interviewing with primary. ECG non acute Had calcium score a few years ago that was 0    Past Medical History:  Diagnosis Date  . H/O breast augmentation   . History of gastric bypass   . Pelvis fracture ([place])    in 30's    Past Surgical History:  Procedure Laterality Date  . APPENDECTOMY    . TONSILLECTOMY       Current Outpatient Medications  Medication Sig Dispense Refill  . Calcium Carbonate-Vitamin D 600-200 MG-UNIT TABS Take by mouth 2 (two) times daily.    . Cholecalciferol (VITAMIN D3) 1000 units CAPS Take by mouth.    . esomeprazole (NEXIUM) 40 MG capsule Take 1 capsule (40 mg total) by mouth daily. 90 capsule 0  . glucosamine-chondroitin 500-400 MG tablet Take 1 tablet by mouth 2 (two) times daily.     Marland Kitchen KRILL OIL PO Take 1,085 mg by mouth.    . levothyroxine (SYNTHROID, LEVOTHROID) 25 MCG tablet Take 1 tablet (25 mcg total) by mouth daily. 90 tablet 0  . loratadine (CLARITIN) 10 MG tablet Take 1 tablet daily. 90 tablet 0  . Multiple Vitamins-Minerals (THERA-M) TABS Take by mouth.    . Omega-3 Fatty Acids (FISH OIL) 1000 MG CAPS Take by mouth.    . orlistat (ALLI) 60 MG capsule Take 60 mg by mouth 3 (three) times daily with meals.    Marland Kitchen PROAIR HFA 108 (90 Base) MCG/ACT inhaler inhale 2 puffs by mouth every 4 hours if needed for wheezing  0  .  Pseudoephedrine-guaiFENesin (MUCINEX D PO) Take 600 mg by mouth.    . pyridoxine (B-6) 100 MG tablet Take 100 mg by mouth 2 (two) times daily.    . riTUXimab (RITUXAN) 500 MG/50ML injection Inject into the vein.    . TURMERIC CURCUMIN PO Take 500 mg by mouth 2 (two) times daily.     . valACYclovir (VALTREX) 500 MG tablet Take 1 tablet (500 mg total) by mouth daily. 90 tablet 1   No current facility-administered medications for this visit.     Allergies:   Patient has no known allergies.    Social History:  The patient  reports that she has never smoked. She has never used smokeless tobacco. She reports current alcohol use. She reports that she does not use drugs.   Family History:  The patient's family history includes Heart disease in her father; Hypertension in her father and mother.    ROS:  Please see the history of present illness.   Otherwise, review of systems are positive for none.   All other systems are reviewed and negative.    PHYSICAL EXAM: VS:  There were no vitals taken for this visit. , BMI There is no height or weight on file to calculate BMI. Affect appropriate Healthy:  appears stated age 11: normal Neck  supple with no adenopathy JVP normal no bruits no thyromegaly Lungs clear with no wheezing and good diaphragmatic motion Heart:  S1/S2 no murmur, no rub, gallop or click PMI normal Abdomen: benighn, BS positve, no tenderness, no AAA no bruit.  No HSM or HJR Distal pulses intact with no bruits No edema Neuro non-focal Skin warm and dry No muscular weakness    EKG:  07/17/18 NSR RSR' normal    Recent Labs: 07/17/2018: ALT 15; BUN 13; Creatinine, Ser 0.56; Hemoglobin 13.9; Platelets 309.0; Potassium 3.6; Sodium 138; TSH 1.73    Lipid Panel    Component Value Date/Time   CHOL 200 07/17/2018 1607   TRIG 86.0 07/17/2018 1607   HDL 94.20 07/17/2018 1607   CHOLHDL 2 07/17/2018 1607   VLDL 17.2 07/17/2018 1607   LDLCALC 89 07/17/2018 1607      Wt  Readings from Last 3 Encounters:  07/17/18 182 lb (82.6 kg)  08/03/17 181 lb (82.1 kg)  06/22/17 183 lb (83 kg)      Other studies Reviewed: Additional studies/ records that were reviewed today include: Notes from primary , ECG labs .    ASSESSMENT AND PLAN:  1.  Chest Pain: Atypical with normal ECG *** 2. RA: f/u Beekman on rituxan  3. Thyroid: continue replacement TSH normal 4. GERD: post gastric bypass on dual Rx low carb diet f/u primary  5.    Current medicines are reviewed at length with the patient today.  The patient does not have concerns regarding medicines.  The following changes have been made:  no change  Labs/ tests ordered today include: *** No orders of the defined types were placed in this encounter.    Disposition:   FU with cardiology PRN      Signed, Jenkins Rouge, MD  07/18/2018 5:52 PM    [address], [place], [place]  [address] Phone: [phone]; Fax: [phone]

## 2018-07-19 ENCOUNTER — Telehealth: Payer: Self-pay

## 2018-07-19 NOTE — Telephone Encounter (Signed)
Pt aware of response below. Will keep appointment with Dr. Johnsie Cancel to see how things go first.

## 2018-07-19 NOTE — Telephone Encounter (Signed)
Copied from [place] [id number]. Topic: General - Other >> Jul 18, 2018  4:48 PM Windy Kalata wrote: Reason for CRM: Patient has an appt on 07/23/18 With Dr. Johnsie Cancel a cardioligist, she states that she does not see very good reviews for him and would like to know what Dr. Quay Burow thinks as she values her opinion and wants to make sure.  Please advise  Best call back is [phone]

## 2018-07-19 NOTE — Telephone Encounter (Signed)
There are always bad reviews about every doctor.  I have had several patients you love dr Johnsie Cancel.  If she would feel more comfortable seeing someone else - she can look up the different doctors and call the office and see if she can change.

## 2018-07-20 ENCOUNTER — Ambulatory Visit (INDEPENDENT_AMBULATORY_CARE_PROVIDER_SITE_OTHER): Payer: Medicare Other | Admitting: Cardiovascular Disease

## 2018-07-20 ENCOUNTER — Encounter: Payer: Self-pay | Admitting: Cardiovascular Disease

## 2018-07-20 VITALS — BP 145/82 | Ht 64.0 in | Wt 184.2 lb

## 2018-07-20 DIAGNOSIS — R079 Chest pain, unspecified: Secondary | ICD-10-CM

## 2018-07-20 DIAGNOSIS — R0789 Other chest pain: Secondary | ICD-10-CM | POA: Insufficient documentation

## 2018-07-20 NOTE — Assessment & Plan Note (Signed)
Julia [name]  was referred to me by Dr. [name] for atypical chest pain.  She has no cardiac risk factors.  Her pain began 6 weeks ago.  Fairly constant with radiation between her shoulder blades blades and to her shoulders.  She did have a coronary calcium score performed 04/20/2016 which was 0.  The pain does not radiate any other places nor the other other associated symptoms.  And go to get a 2D echo and a pharmacologic Myoview stress test to further evaluate.  If these are negative I suggest she undergo further GI evaluation.

## 2018-07-20 NOTE — Progress Notes (Signed)
07/20/2018 Julia [name]   Sep 19, 1950  [id number]  Primary Physician Julia [name], Julia Lick, MD Primary Cardiologist: Julia Harp MD Julia [name], [place]  HPI:  Julia [name] is a 68 y.o. mildly overweight married Caucasian female with no children referred by Dr. Quay [name] for cardiovascular evaluation because of atypical chest pain.  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  She retired in 2018.  She does have rheumatoid arthritis which she is had for the last 15 years.  There is no family history for heart disease.  She is never had a heart attack or stroke.  She does not smoke.  She is not diabetic.  She is had new onset chest pain over the last 6 weeks that lasts all day.  Radiates between her shoulder blades and to her both shoulders.  There is no associated shortness of breath.  She does have a history of GERD on antireflux medications.  She did have a coronary calcium score performed 04/20/2016 which was 0.   Current Meds  Medication Sig  . Calcium Carbonate-Vitamin D 600-200 MG-UNIT TABS Take by mouth 2 (two) times daily.  . Cholecalciferol (VITAMIN D3) 1000 units CAPS Take by mouth.  . esomeprazole (NEXIUM) 40 MG capsule Take 1 capsule (40 mg total) by mouth daily.  Marland Kitchen glucosamine-chondroitin 500-400 MG tablet Take 1 tablet by mouth 2 (two) times daily.   Marland Kitchen KRILL OIL PO Take 1,085 mg by mouth.  . levothyroxine (SYNTHROID, LEVOTHROID) 25 MCG tablet Take 1 tablet (25 mcg total) by mouth daily.  Marland Kitchen loratadine (CLARITIN) 10 MG tablet Take 1 tablet daily.  . Multiple Vitamins-Minerals (THERA-M) TABS Take by mouth.  . Omega-3 Fatty Acids (FISH OIL) 1000 MG CAPS Take by mouth.  . orlistat (ALLI) 60 MG capsule Take 60 mg by mouth 3 (three) times daily with meals.  Marland Kitchen PROAIR HFA 108 (90 Base) MCG/ACT inhaler inhale 2 puffs by mouth every 4 hours if needed for wheezing  . Pseudoephedrine-guaiFENesin (MUCINEX D PO) Take 600 mg by mouth.  . pyridoxine (B-6) 100 MG tablet Take  100 mg by mouth 2 (two) times daily.  . riTUXimab (RITUXAN) 500 MG/50ML injection Inject into the vein.  . TURMERIC CURCUMIN PO Take 500 mg by mouth 2 (two) times daily.   . valACYclovir (VALTREX) 500 MG tablet Take 1 tablet (500 mg total) by mouth daily.     No Known Allergies  Social History   Socioeconomic History  . Marital status: Married    Spouse name: Not on file  . Number of children: Not on file  . Years of education: Not on file  . Highest education level: Not on file  Occupational History  . Not on file  Social Needs  . Financial resource strain: Not on file  . Food insecurity:    Worry: Not on file    Inability: Not on file  . Transportation needs:    Medical: Not on file    Non-medical: Not on file  Tobacco Use  . Smoking status: Never Smoker  . Smokeless tobacco: Never Used  Substance and Sexual Activity  . Alcohol use: Yes  . Drug use: No  . Sexual activity: Not on file  Lifestyle  . Physical activity:    Days per week: Not on file    Minutes per session: Not on file  . Stress: Not on file  Relationships  . Social connections:    Talks on phone: Not  on file    Gets together: Not on file    Attends religious service: Not on file    Active member of club or organization: Not on file    Attends meetings of clubs or organizations: Not on file    Relationship status: Not on file  . Intimate partner violence:    Fear of current or ex partner: Not on file    Emotionally abused: Not on file    Physically abused: Not on file    Forced sexual activity: Not on file  Other Topics Concern  . Not on file  Social History Narrative   Retired 07/10/17 from [employer]   No regular exercise     Review of Systems: General: negative for chills, fever, night sweats or weight changes.  Cardiovascular: negative for chest pain, dyspnea on exertion, edema, orthopnea, palpitations, paroxysmal nocturnal dyspnea or shortness of breath Dermatological: negative for  rash Respiratory: negative for cough or wheezing Urologic: negative for hematuria Abdominal: negative for nausea, vomiting, diarrhea, bright red blood per rectum, melena, or hematemesis Neurologic: negative for visual changes, syncope, or dizziness All other systems reviewed and are otherwise negative except as noted above.    Blood pressure (!) 145/82, height 5\' 4"  (1.626 m), weight 184 lb 3.2 oz (83.6 kg).  General appearance: alert and no distress Neck: no adenopathy, no carotid bruit, no JVD, supple, symmetrical, trachea midline and thyroid not enlarged, symmetric, no tenderness/mass/nodules Lungs: clear to auscultation bilaterally Heart: regular rate and rhythm, S1, S2 normal, no murmur, click, rub or gallop Extremities: extremities normal, atraumatic, no cyanosis or edema Pulses: 2+ and symmetric Skin: Skin color, texture, turgor normal. No rashes or lesions Neurologic: Alert and oriented X 3, normal strength and tone. Normal symmetric reflexes. Normal coordination and gait  EKG sinus rhythm at 68 without ST or T wave changes.  There was ultra criteria for LVH.  I personally reviewed this EKG.  ASSESSMENT AND PLAN:   Atypical chest pain [name]  was referred to me by Dr. [name] for atypical chest pain.  She has no cardiac risk factors.  Her pain began 6 weeks ago.  Fairly constant with radiation between her shoulder blades blades and to her shoulders.  She did have a coronary calcium score performed 04/20/2016 which was 0.  The pain does not radiate any other places nor the other other associated symptoms.  And go to get a 2D echo and a pharmacologic Myoview stress test to further evaluate.  If these are negative I suggest she undergo further GI evaluation.      Julia Harp MD FACP,FACC,FAHA, [facility] 07/20/2018 11:27 AM

## 2018-07-20 NOTE — Patient Instructions (Signed)
Medication Instructions:  NONE If you need a refill on your cardiac medications before your next appointment, please call your pharmacy.   Lab work: NONE If you have labs (blood work) drawn today and your tests are completely normal, you will receive your results only by: Marland Kitchen MyChart Message (if you have MyChart) OR . A paper copy in the mail If you have any lab test that is abnormal or we need to change your treatment, we will call you to review the results.  Testing/Procedures: Your physician has requested that you have a lexiscan myoview. For further information please visit [email or web address]. Please follow instruction sheet, as given.  PLEASE SCHEDULE NEXT WEEK IF POSSIBLE PER DR. Gwenlyn Found  Your physician has requested that you have an echocardiogram. Echocardiography is a painless test that uses sound waves to create images of your heart. It provides your doctor with information about the size and shape of your heart and how well your heart's chambers and valves are working. This procedure takes approximately one hour. There are no restrictions for this procedure.  PLEASE SCHEDULE NEXT WEEK IF POSSIBLE PER DR. Gwenlyn Found   Follow-Up: At [facility], you and your health needs are our priority.  As part of our continuing mission to provide you with exceptional heart care, we have created designated Provider Care Teams.  These Care Teams include your primary Cardiologist (physician) and Advanced Practice Providers (APPs -  Physician Assistants and Nurse Practitioners) who all work together to provide you with the care you need, when you need it. . You will need a follow up appointment in 1 MONTH. You may see Dr. Gwenlyn Found or one of the following Advanced Practice Providers on your designated Care Team:   . Kerin Ransom, [place] . Almyra Deforest, PA-C . Fabian Sharp, PA-C . Jory Sims, DNP . Rosaria Ferries, PA-C . Roby Lofts, PA-C . Sande Rives, PA-C

## 2018-07-23 ENCOUNTER — Ambulatory Visit: Payer: Medicare Other | Admitting: Cardiovascular Disease

## 2018-07-24 ENCOUNTER — Telehealth (HOSPITAL_COMMUNITY): Payer: Self-pay | Admitting: *Deleted

## 2018-07-24 NOTE — Telephone Encounter (Signed)
Patient given detailed instructions per Myocardial Perfusion Study Information Sheet for the test on 07/27/18 at 9:45. Patient notified to arrive 15 minutes early and that it is imperative to arrive on time for appointment to keep from having the test rescheduled.  If you need to cancel or reschedule your appointment, please call the office within 24 hours of your appointment. . Patient verbalized understanding.Julia [name]

## 2018-07-26 DIAGNOSIS — Z6831 Body mass index (BMI) 31.0-31.9, adult: Secondary | ICD-10-CM | POA: Diagnosis not present

## 2018-07-26 DIAGNOSIS — M810 Age-related osteoporosis without current pathological fracture: Secondary | ICD-10-CM | POA: Diagnosis not present

## 2018-07-26 DIAGNOSIS — R7612 Nonspecific reaction to cell mediated immunity measurement of gamma interferon antigen response without active tuberculosis: Secondary | ICD-10-CM | POA: Diagnosis not present

## 2018-07-26 DIAGNOSIS — E669 Obesity, unspecified: Secondary | ICD-10-CM | POA: Diagnosis not present

## 2018-07-26 DIAGNOSIS — M0579 Rheumatoid arthritis with rheumatoid factor of multiple sites without organ or systems involvement: Secondary | ICD-10-CM | POA: Diagnosis not present

## 2018-07-27 ENCOUNTER — Telehealth: Payer: Self-pay

## 2018-07-27 ENCOUNTER — Other Ambulatory Visit: Payer: Self-pay

## 2018-07-27 ENCOUNTER — Ambulatory Visit (HOSPITAL_COMMUNITY): Payer: Medicare Other | Attending: Cardiovascular Disease

## 2018-07-27 ENCOUNTER — Ambulatory Visit (HOSPITAL_BASED_OUTPATIENT_CLINIC_OR_DEPARTMENT_OTHER): Payer: Medicare Other

## 2018-07-27 DIAGNOSIS — R079 Chest pain, unspecified: Secondary | ICD-10-CM

## 2018-07-27 LAB — MYOCARDIAL PERFUSION IMAGING
LV sys vol: 20 mL
LVDIAVOL: 64 mL (ref 46–106)
Peak HR: 108 {beats}/min
Rest HR: 75 {beats}/min
SDS: 1
SRS: 1
SSS: 2
TID: 1.13

## 2018-07-27 MED ORDER — TECHNETIUM TC 99M TETROFOSMIN IV KIT
32.6000 | PACK | Freq: Once | INTRAVENOUS | Status: AC | PRN
  Administered 2018-07-27: 32.6 via INTRAVENOUS
  Filled 2018-07-27: qty 33

## 2018-07-27 MED ORDER — REGADENOSON 0.4 MG/5ML IV SOLN
0.4000 mg | Freq: Once | INTRAVENOUS | Status: AC
  Administered 2018-07-27: 0.4 mg via INTRAVENOUS

## 2018-07-27 MED ORDER — TECHNETIUM TC 99M TETROFOSMIN IV KIT
10.4000 | PACK | Freq: Once | INTRAVENOUS | Status: AC | PRN
  Administered 2018-07-27: 10.4 via INTRAVENOUS
  Filled 2018-07-27: qty 11

## 2018-07-27 NOTE — Telephone Encounter (Signed)
Copied from [place] [id number]. Topic: General - Other >> Jul 26, 2018  4:02 PM Rayann Heman wrote: Reason for CRM: elizabeth calling from [facility] rheumatology and requesting that we fax over most recent lab results. Please advise [phone]

## 2018-07-27 NOTE — Telephone Encounter (Signed)
Labs have been faxed over

## 2018-07-30 ENCOUNTER — Encounter: Payer: Self-pay | Admitting: Cardiovascular Disease

## 2018-07-30 NOTE — Telephone Encounter (Signed)
This encounter was created in error - please disregard.

## 2018-07-30 NOTE — Telephone Encounter (Signed)
New message   Patient is calling to check on test results.

## 2018-08-01 ENCOUNTER — Other Ambulatory Visit: Payer: Self-pay | Admitting: Internal Medicine

## 2018-08-01 ENCOUNTER — Ambulatory Visit (INDEPENDENT_AMBULATORY_CARE_PROVIDER_SITE_OTHER)
Admission: RE | Admit: 2018-08-01 | Discharge: 2018-08-01 | Disposition: A | Discharge status: Home / Self Care | Payer: Medicare Other | Source: Ambulatory Visit | Attending: Internal Medicine | Admitting: Internal Medicine

## 2018-08-01 ENCOUNTER — Encounter: Payer: Self-pay | Admitting: Gastroenterology

## 2018-08-01 DIAGNOSIS — M810 Age-related osteoporosis without current pathological fracture: Secondary | ICD-10-CM | POA: Diagnosis not present

## 2018-08-01 DIAGNOSIS — K219 Gastro-esophageal reflux disease without esophagitis: Secondary | ICD-10-CM

## 2018-08-03 ENCOUNTER — Other Ambulatory Visit: Payer: Self-pay | Admitting: Internal Medicine

## 2018-08-03 DIAGNOSIS — R0789 Other chest pain: Secondary | ICD-10-CM

## 2018-08-03 DIAGNOSIS — I2510 Atherosclerotic heart disease of native coronary artery without angina pectoris: Secondary | ICD-10-CM

## 2018-08-03 DIAGNOSIS — M069 Rheumatoid arthritis, unspecified: Secondary | ICD-10-CM

## 2018-08-03 DIAGNOSIS — I10 Essential (primary) hypertension: Secondary | ICD-10-CM

## 2018-08-03 DIAGNOSIS — Z8249 Family history of ischemic heart disease and other diseases of the circulatory system: Secondary | ICD-10-CM

## 2018-08-06 ENCOUNTER — Encounter: Payer: Self-pay | Admitting: Internal Medicine

## 2018-08-06 DIAGNOSIS — H40033 Anatomical narrow angle, bilateral: Secondary | ICD-10-CM | POA: Diagnosis not present

## 2018-08-06 DIAGNOSIS — H2513 Age-related nuclear cataract, bilateral: Secondary | ICD-10-CM | POA: Diagnosis not present

## 2018-08-06 DIAGNOSIS — H25013 Cortical age-related cataract, bilateral: Secondary | ICD-10-CM | POA: Diagnosis not present

## 2018-08-06 DIAGNOSIS — H40013 Open angle with borderline findings, low risk, bilateral: Secondary | ICD-10-CM | POA: Diagnosis not present

## 2018-08-18 ENCOUNTER — Other Ambulatory Visit: Payer: Self-pay | Admitting: Internal Medicine

## 2018-08-21 ENCOUNTER — Encounter: Payer: Self-pay | Admitting: Cardiovascular Disease

## 2018-08-21 ENCOUNTER — Ambulatory Visit (INDEPENDENT_AMBULATORY_CARE_PROVIDER_SITE_OTHER): Payer: Medicare Other | Admitting: Cardiovascular Disease

## 2018-08-21 DIAGNOSIS — I2510 Atherosclerotic heart disease of native coronary artery without angina pectoris: Secondary | ICD-10-CM

## 2018-08-21 DIAGNOSIS — R0789 Other chest pain: Secondary | ICD-10-CM | POA: Diagnosis not present

## 2018-08-21 NOTE — Patient Instructions (Signed)
Medication Instructions:  Your physician recommends that you continue on your current medications as directed. Please refer to the Current Medication list given to you today.  If you need a refill on your cardiac medications before your next appointment, please call your pharmacy.   Lab work: NONE If you have labs (blood work) drawn today and your tests are completely normal, you will receive your results only by: Marland Kitchen MyChart Message (if you have MyChart) OR . A paper copy in the mail If you have any lab test that is abnormal or we need to change your treatment, we will call you to review the results.  Testing/Procedures: NONE  Follow-Up: At [facility], you and your health needs are our priority.  As part of our continuing mission to provide you with exceptional heart care, we have created designated Provider Care Teams.  These Care Teams include your primary Cardiologist (physician) and Advanced Practice Providers (APPs -  Physician Assistants and Nurse Practitioners) who all work together to provide you with the care you need, when you need it. . You will need a follow up appointment in 6 months.  Please call our office 2 months in advance to schedule this appointment.  You may see Dr. Gwenlyn Found or one of the following Advanced Practice Providers on your designated Care Team:   . Kerin Ransom, [place] . Almyra Deforest, PA-C . Fabian Sharp, PA-C . Jory Sims, DNP . Rosaria Ferries, PA-C . Roby Lofts, PA-C . Sande Rives, PA-C

## 2018-08-21 NOTE — Assessment & Plan Note (Signed)
Julia [name] returns today for follow-up of atypical chest pain.  A 2D echo and Myoview stress test performed 07/27/2018 were entirely normal.  She still has constant substernal chest pressure.  She is scheduled to see a gastroenterologist next week.  I will see her back in 6 months.

## 2018-08-21 NOTE — Progress Notes (Signed)
[name] returns today for follow-up of atypical chest pain.  A 2D echo and Myoview stress test performed 07/27/2018 were entirely normal.  She still has constant substernal chest pressure.  She is scheduled to see a gastroenterologist next week.  I will see her back in 6 months.  Lorretta Harp, M.D., [place], [facility], [address]. [place], [place]  [address]  [phone] 08/21/2018 11:01 AM

## 2018-08-27 DIAGNOSIS — L821 Other seborrheic keratosis: Secondary | ICD-10-CM | POA: Diagnosis not present

## 2018-08-27 DIAGNOSIS — D2272 Melanocytic nevi of left lower limb, including hip: Secondary | ICD-10-CM | POA: Diagnosis not present

## 2018-08-27 DIAGNOSIS — L819 Disorder of pigmentation, unspecified: Secondary | ICD-10-CM | POA: Diagnosis not present

## 2018-08-27 DIAGNOSIS — D225 Melanocytic nevi of trunk: Secondary | ICD-10-CM | POA: Diagnosis not present

## 2018-08-27 DIAGNOSIS — L814 Other melanin hyperpigmentation: Secondary | ICD-10-CM | POA: Diagnosis not present

## 2018-08-29 DIAGNOSIS — J069 Acute upper respiratory infection, unspecified: Secondary | ICD-10-CM | POA: Diagnosis not present

## 2018-08-31 ENCOUNTER — Ambulatory Visit: Payer: Medicare Other | Admitting: Gastroenterology

## 2018-09-03 ENCOUNTER — Encounter: Payer: Self-pay | Admitting: Gastroenterology

## 2018-09-03 ENCOUNTER — Ambulatory Visit (INDEPENDENT_AMBULATORY_CARE_PROVIDER_SITE_OTHER): Payer: Medicare Other | Admitting: Gastroenterology

## 2018-09-03 VITALS — BP 146/92 | HR 76 | Ht 62.5 in | Wt 183.5 lb

## 2018-09-03 DIAGNOSIS — K219 Gastro-esophageal reflux disease without esophagitis: Secondary | ICD-10-CM

## 2018-09-03 DIAGNOSIS — R079 Chest pain, unspecified: Secondary | ICD-10-CM

## 2018-09-03 DIAGNOSIS — R030 Elevated blood-pressure reading, without diagnosis of hypertension: Secondary | ICD-10-CM | POA: Diagnosis not present

## 2018-09-03 DIAGNOSIS — I2510 Atherosclerotic heart disease of native coronary artery without angina pectoris: Secondary | ICD-10-CM

## 2018-09-03 MED ORDER — ESOMEPRAZOLE MAGNESIUM 40 MG PO CPDR: 40.0000 mg | DELAYED_RELEASE_CAPSULE | Freq: Two times a day (BID) | ORAL | 1 refills | Status: DC

## 2018-09-03 NOTE — Progress Notes (Signed)
HPI :  68 year old female with a history of gastric bypass, rheumatoid arthritis on biologic therapy, reflux, referred here by [name] for reflux / chest discomfort.   For the past few months the patient endorses some symptoms in her chest that been bothering her significantly. She has a heaviness in her chest, substernal area, that bothers her mostly all the time. Exertional activities tend to not change this or precipitate it. She is also had associated burning sensation between her shoulder blades. She also has been experiencing a lot of pyrosis recently, particularly at night when she is sleeping, she's been using Tums on multiple occasions each night to try to control her symptoms. She denies any regurgitation of food into her mouth. She denies any dysphagia. No nausea, no vomiting. No abdominal pain after she eats. She had a gastric bypass surgery several years ago. She has not had an endoscopy in more than 20-25 years. She has been taking Nexium capsule roughly once a day. No NSAID use. Despite nexium she continues to have significant pyrosis and chest symptoms. She's been evaluated by cardiology, she reports a normal nuclear stress test and echocardiogram for these symptoms which has not been less likely to be cardiac in nature. She is scheduled for a cardiac CT tomorrow for further evaluation. She otherwise notes some intense mid back pain that occurs anywhere from once a day to once a month that she states feel better with massage. She states the symptoms sometimes feels like a heart attack tach to her. She denies any right upper quadrant pain or upper abdominal pain.  Of note her blood pressure is elevated to 140s over 90s today. She's quite concerned about this as her blood pressure has been mildly elevated recently. She has a blood pressure cuff at home but does not use it too frequently. She denies a history of hypertension.  Colonoscopy 05/2016 - normal exam, repeat in 10  years   Past Medical History:  Diagnosis Date  . H/O breast augmentation   . History of gastric bypass   . Pelvis fracture ([place])    in 30's  . Pneumonia   . RA (rheumatoid arthritis) ([place])      Past Surgical History:  Procedure Laterality Date  . APPENDECTOMY    . GASTRIC BYPASS    . TONSILLECTOMY     Family History  Problem Relation Age of Onset  . Hypertension Mother   . Heart disease Mother   . Hypertension Father   . Ulcers Father   . Kidney Stones Father   . Hypertension Sister   . Hypertension Brother   . Heart attack Maternal Grandmother    Social History   Tobacco Use  . Smoking status: Never Smoker  . Smokeless tobacco: Never Used  Substance Use Topics  . Alcohol use: Yes    Comment: 1 glass of wine  . Drug use: No   Current Outpatient Medications  Medication Sig Dispense Refill  . Calcium Carbonate-Vitamin D 600-200 MG-UNIT TABS Take by mouth 2 (two) times daily.    . Cholecalciferol (VITAMIN D3) 1000 units CAPS Take by mouth.    . esomeprazole (NEXIUM) 40 MG capsule Take 1 capsule (40 mg total) by mouth 2 (two) times daily before a meal. Break open capsule before ingestion 180 capsule 1  . glucosamine-chondroitin 500-400 MG tablet Take 1 tablet by mouth 2 (two) times daily.     Marland Kitchen KRILL OIL PO Take 1,085 mg by mouth.    . levothyroxine (  SYNTHROID, LEVOTHROID) 25 MCG tablet TAKE 1 TABLET(25 MCG) BY MOUTH DAILY 90 tablet 1  . loratadine (CLARITIN) 10 MG tablet Take 1 tablet daily. 90 tablet 0  . Multiple Vitamins-Minerals (THERA-M) TABS Take by mouth.    . Omega-3 Fatty Acids (FISH OIL) 1000 MG CAPS Take by mouth.    . orlistat (ALLI) 60 MG capsule Take 60 mg by mouth 3 (three) times daily with meals.    Marland Kitchen PROAIR HFA 108 (90 Base) MCG/ACT inhaler inhale 2 puffs by mouth every 4 hours if needed for wheezing  0  . Pseudoephedrine-guaiFENesin (MUCINEX D PO) Take 600 mg by mouth.    . pyridoxine (B-6) 100 MG tablet Take 100 mg by mouth 2 (two) times daily.     . riTUXimab (RITUXAN) 500 MG/50ML injection Inject into the vein.    . TURMERIC CURCUMIN PO Take 500 mg by mouth 2 (two) times daily.     . valACYclovir (VALTREX) 500 MG tablet Take 1 tablet (500 mg total) by mouth daily. 90 tablet 1   No current facility-administered medications for this visit.    No Known Allergies   Review of Systems: All systems reviewed and negative except where noted in HPI.   Lab Results  Component Value Date   WBC 6.5 07/17/2018   HGB 13.9 07/17/2018   HCT 41.5 07/17/2018   MCV 96.0 07/17/2018   PLT 309.0 07/17/2018    Lab Results  Component Value Date   CREATININE 0.56 07/17/2018   BUN 13 07/17/2018   NA 138 07/17/2018   K 3.6 07/17/2018   CL 101 07/17/2018   CO2 27 07/17/2018    Lab Results  Component Value Date   ALT 15 07/17/2018   AST 21 07/17/2018   ALKPHOS 50 07/17/2018   BILITOT 0.7 07/17/2018      Physical Exam: BP (!) 146/92 (BP Location: Left Arm)   Pulse 76   Ht 5' 2.5" (1.588 m) Comment: height measured without shoes  Wt 183 lb 8 oz (83.2 kg)   BMI 33.03 kg/m  Constitutional: Pleasant,well-developed, female in no acute distress. HEENT: Normocephalic and atraumatic. Conjunctivae are normal. No scleral icterus. Neck supple.  Cardiovascular: Normal rate, regular rhythm.  Pulmonary/chest: Effort normal and breath sounds normal. No wheezing, rales or rhonchi. Abdominal: Soft, nondistended, nontender. here are no masses palpable. No hepatomegaly. Extremities: no edema Lymphadenopathy: No cervical adenopathy noted. Neurological: Alert and oriented to person place and time. Skin: Skin is warm and dry. No rashes noted. Psychiatric: Normal mood and affect. Behavior is normal.   ASSESSMENT AND PLAN: 68 year old female here for new patient consultation regarding the following:  GERD / atypical chest pain - given her cardiac workup to date this appears to be unlikely related to her heart. She has some ongoing poorly controlled  reflux symptoms and I suspect her chest discomfort may likely be related. I discussed options with her. Given her gastric bypass anatomy (reportedly Roux-en-Y), she would benefit from breaking open her Nexium capsule prior to ingestion to maximize absorption. I recommend trying to do this and increase her dose to twice daily for a few weeks to see if this helps reduce her breakthrough. I will otherwise recommend upper endoscopy given her persistent symptoms despite appropriate therapy. I discussed risk and benefits of endoscopy and anesthesia with her and she wanted to proceed, we'll assess for esophagitis, anatomic changes, ensure no marginal ulcers, etc. May consider a course of liquid Carafate as well. She inquires about the symptoms being  related to her gallbladder. This seems to be a atypical for gallbladder-related pain - seems too high and not prandial, however if her endoscopy is normal and symptoms persist despite therapy may consider an [place]. She agreed with the plan, further recs pending results and her course.  Elevated BP - noted in clinic, recommend she keep a BP log at home using her home cuff, and touch base with Dr. Quay Burow if this is persistently elevated. She agreed.   Eagar Cellar, MD [facility]  CC: [name], MD

## 2018-09-03 NOTE — Patient Instructions (Addendum)
If you are age 66 or older, your body mass index should be between 23-30. Your Body mass index is 33.03 kg/m. If this is out of the aforementioned range listed, please consider follow up with your Primary Care Provider.  If you are age 27 or younger, your body mass index should be between 19-25. Your Body mass index is 33.03 kg/m. If this is out of the aformentioned range listed, please consider follow up with your Primary Care Provider.   You have been scheduled for an endoscopy. Please follow written instructions given to you at your visit today. If you use inhalers (even only as needed), please bring them with you on the day of your procedure. Your physician has requested that you go to www.startemmi.com and enter the access code given to you at your visit today. This web site gives a general overview about your procedure. However, you should still follow specific instructions given to you by our office regarding your preparation for the procedure.  Increase Nexium to twice a day; break open the capsule prior to ingestion (30 minutes to 1 hour before meals).  Track your Blood Pressure at home and follow up with your Primary Care Provider as needed.  Thank you for entrusting me with your care and for choosing [facility], Dr. [place] Cellar

## 2018-09-04 ENCOUNTER — Ambulatory Visit (INDEPENDENT_AMBULATORY_CARE_PROVIDER_SITE_OTHER)
Admission: RE | Admit: 2018-09-04 | Discharge: 2018-09-04 | Disposition: A | Discharge status: Home / Self Care | Payer: Self-pay | Source: Ambulatory Visit | Attending: Internal Medicine | Admitting: Internal Medicine

## 2018-09-04 ENCOUNTER — Telehealth: Payer: Self-pay | Admitting: Internal Medicine

## 2018-09-04 DIAGNOSIS — I2510 Atherosclerotic heart disease of native coronary artery without angina pectoris: Secondary | ICD-10-CM

## 2018-09-04 DIAGNOSIS — M810 Age-related osteoporosis without current pathological fracture: Secondary | ICD-10-CM | POA: Diagnosis not present

## 2018-09-04 MED ORDER — AMLODIPINE BESYLATE 5 MG PO TABS: 5.0000 mg | ORAL_TABLET | Freq: Every day | ORAL | 5 refills | Status: DC

## 2018-09-04 NOTE — Telephone Encounter (Signed)
Copied from [place] [id number]. Topic: Quick Communication - See Telephone Encounter >> Sep 04, 2018  9:19 AM Antonieta Iba C wrote: CRM for notification. See Telephone encounter for: 09/04/18.  Pt called in to make provider aware of her BP numbers. Pt says that she is concerned and feels that she need to take something to control it. Pt says at her last ov her BP was high reading 149/86, pt says that at yesterdays visit with the specialist she was told that her reading was 146/100 and after sitting for about 10 minutes her new reading was 146/92. Pt would like to be advised on if provider could prescribe her medication for this?    CB: [phone]

## 2018-09-04 NOTE — Telephone Encounter (Signed)
Pt aware of response below.  

## 2018-09-04 NOTE — Telephone Encounter (Signed)
Lets try amlodipine 5 mg daily - take once daily at any time of day.  Monitor BP at home.    Med pending.

## 2018-09-06 ENCOUNTER — Encounter: Payer: Self-pay | Admitting: Gastroenterology

## 2018-09-06 ENCOUNTER — Ambulatory Visit (AMBULATORY_SURGERY_CENTER): Payer: Medicare Other | Admitting: Gastroenterology

## 2018-09-06 VITALS — BP 128/56 | HR 62 | Temp 96.6°F | Resp 12 | Ht 62.5 in | Wt 183.0 lb

## 2018-09-06 DIAGNOSIS — K219 Gastro-esophageal reflux disease without esophagitis: Secondary | ICD-10-CM

## 2018-09-06 DIAGNOSIS — K2970 Gastritis, unspecified, without bleeding: Secondary | ICD-10-CM

## 2018-09-06 DIAGNOSIS — K208 Other esophagitis: Secondary | ICD-10-CM | POA: Diagnosis not present

## 2018-09-06 DIAGNOSIS — R1013 Epigastric pain: Secondary | ICD-10-CM

## 2018-09-06 DIAGNOSIS — K259 Gastric ulcer, unspecified as acute or chronic, without hemorrhage or perforation: Secondary | ICD-10-CM

## 2018-09-06 DIAGNOSIS — R079 Chest pain, unspecified: Secondary | ICD-10-CM

## 2018-09-06 DIAGNOSIS — K2950 Unspecified chronic gastritis without bleeding: Secondary | ICD-10-CM | POA: Diagnosis not present

## 2018-09-06 DIAGNOSIS — I10 Essential (primary) hypertension: Secondary | ICD-10-CM | POA: Diagnosis not present

## 2018-09-06 HISTORY — PX: UPPER GI ENDOSCOPY: SHX6162

## 2018-09-06 MED ORDER — SODIUM CHLORIDE 0.9 % IV SOLN: 500.0000 mL | Freq: Once | INTRAVENOUS | Status: DC

## 2018-09-06 MED ORDER — SUCRALFATE 1 GM/10ML PO SUSP: 1.0000 g | Freq: Four times a day (QID) | ORAL | 0 refills | Status: DC | PRN

## 2018-09-06 NOTE — Progress Notes (Signed)
Report given to PACU, vss 

## 2018-09-06 NOTE — Progress Notes (Signed)
Called to room to assist during endoscopic procedure.  Patient ID and intended procedure confirmed with present staff. Received instructions for my participation in the procedure from the performing physician.  

## 2018-09-06 NOTE — Patient Instructions (Signed)
Await pathology results.  Continue Nexium trial of twice daily capsules.  Crack open capsule prior to ingestion.  Trial of carafate 10cc by mouth every 6 hours as needed.  YOU HAD AN ENDOSCOPIC PROCEDURE TODAY AT [facility]:   Refer to the procedure report that was given to you for any specific questions about what was found during the examination.  If the procedure report does not answer your questions, please call your gastroenterologist to clarify.  If you requested that your care partner not be given the details of your procedure findings, then the procedure report has been included in a sealed envelope for you to review at your convenience later.  YOU SHOULD EXPECT: Some feelings of bloating in the abdomen. Passage of more gas than usual.  Walking can help get rid of the air that was put into your GI tract during the procedure and reduce the bloating. If you had a lower endoscopy (such as a colonoscopy or flexible sigmoidoscopy) you may notice spotting of blood in your stool or on the toilet paper. If you underwent a bowel prep for your procedure, you may not have a normal bowel movement for a few days.  Please Note:  You might notice some irritation and congestion in your nose or some drainage.  This is from the oxygen used during your procedure.  There is no need for concern and it should clear up in a day or so.  SYMPTOMS TO REPORT IMMEDIATELY:    Following upper endoscopy (EGD)  Vomiting of blood or coffee ground material  New chest pain or pain under the shoulder blades  Painful or persistently difficult swallowing  New shortness of breath  Fever of 100F or higher  Black, tarry-looking stools  For urgent or emergent issues, a gastroenterologist can be reached at any hour by calling [phone].   DIET:  We do recommend a small meal at first, but then you may proceed to your regular diet.  Drink plenty of fluids but you should avoid alcoholic beverages for 24  hours.  ACTIVITY:  You should plan to take it easy for the rest of today and you should NOT DRIVE or use heavy machinery until tomorrow (because of the sedation medicines used during the test).    FOLLOW UP: Our staff will call the number listed on your records the next business day following your procedure to check on you and address any questions or concerns that you may have regarding the information given to you following your procedure. If we do not reach you, we will leave a message.  However, if you are feeling well and you are not experiencing any problems, there is no need to return our call.  We will assume that you have returned to your regular daily activities without incident.  If any biopsies were taken you will be contacted by phone or by letter within the next 1-3 weeks.  Please call us at [phone] if you have not heard about the biopsies in 3 weeks.    SIGNATURES/CONFIDENTIALITY: You and/or your care partner have signed paperwork which will be entered into your electronic medical record.  These signatures attest to the fact that that the information above on your After Visit Summary has been reviewed and is understood.  Full responsibility of the confidentiality of this discharge information lies with you and/or your care-partner.

## 2018-09-06 NOTE — Op Note (Signed)
[place] Patient Name: Julia [name] Procedure Date: 09/06/2018 10:29 AM MRN: [id number] Endoscopist: Remo Lipps P. Havery Moros , MD Age: 68 Referring MD:  Date of Birth: 08-23-1950 Gender: Female Account #: [id number] Procedure:                Upper GI endoscopy Indications:              Heartburn, Chest pain (non cardiac), epigastric                            discomfort - symptoms persist despite nexium once                            daily, negative cardiac workup Medicines:                Monitored Anesthesia Care Procedure:                Pre-Anesthesia Assessment:                           - Prior to the procedure, a History and Physical                            was performed, and patient medications and                            allergies were reviewed. The patient's tolerance of                            previous anesthesia was also reviewed. The risks                            and benefits of the procedure and the sedation                            options and risks were discussed with the patient.                            All questions were answered, and informed consent                            was obtained. Prior Anticoagulants: The patient has                            taken no previous anticoagulant or antiplatelet                            agents. ASA Grade Assessment: II - A patient with                            mild systemic disease. After reviewing the risks                            and benefits, the patient was deemed in  satisfactory condition to undergo the procedure.                           After obtaining informed consent, the endoscope was                            passed under direct vision. Throughout the                            procedure, the patient's blood pressure, pulse, and                            oxygen saturations were monitored continuously. The                            Endoscope was  introduced through the mouth, and                            advanced to the jejunum. The upper GI endoscopy was                            accomplished without difficulty. The patient                            tolerated the procedure well. Scope In: Scope Out: Findings:                 Esophagogastric landmarks were identified: the                            Z-line was found at 36 cm, the gastroesophageal                            junction was found at 36 cm and the upper extent of                            the gastric folds was found at 36 cm from the                            incisors.                           The Z-line was irregular and was found 36 cm from                            the incisors, irregular extensions of salmon                            colored mucosa, max extent 1cm. Biopsies were taken                            with a cold forceps for histology.  The exam of the esophagus was otherwise normal.                           Evidence of a Roux-en-Y gastrojejunostomy was                            found. The gastrojejunal anastomosis was                            characterized by visible retained protruding suture                            at the inferior border of the anastomosis. The                            suture was removed with cold forceps.                           A single small erosion was found in the gastric                            pouch. The gastric pouch otherwise appeared normal.                            Biopsies were taken with a cold forceps for                            Helicobacter pylori testing.                           The exam of the stomach was otherwise normal.                           The examined small bowel limb was normal. Complications:            No immediate complications. Estimated blood loss:                            Minimal. Estimated Blood Loss:     Estimated blood loss was  minimal. Impression:               - Esophagogastric landmarks identified.                           - Z-line irregular, 36 cm from the incisors.                            Biopsied.                           - Normal esophagus otherwise                           - Roux-en-Y gastrojejunostomy with gastrojejunal  anastomosis characterized by visible suture which                            was removed.                           - Gastric pouch erosion. Biopsied to rule out H                            pylori.                           - Normal examined small bowel limb. Recommendation:           - Patient has a contact number available for                            emergencies. The signs and symptoms of potential                            delayed complications were discussed with the                            patient. Return to normal activities tomorrow.                            Written discharge instructions were provided to the                            patient.                           - Resume previous diet.                           - Continue present medications.                           - Continue trial of nexium twice daily, crack open                            capsule prior to ingestion                           - Trial of liquid carafate 10cc po q 6 hrs PRN                           - Await pathology results.                           - Consideration for RUQ [place] to assess for gallstones                            if pain persists Remo Lipps P. Armbruster, MD 09/06/2018 10:54:30 AM This report has been signed electronically.

## 2018-09-07 ENCOUNTER — Telehealth: Payer: Self-pay

## 2018-09-07 NOTE — Telephone Encounter (Signed)
  Follow up Call-  Call back number 09/06/2018  Post procedure Call Back phone  # [phone]  Permission to leave phone message Yes  Some recent data might be hidden     No ID on voicemail No message left

## 2018-09-07 NOTE — Telephone Encounter (Signed)
  Follow up Call-  Call back number 09/06/2018  Post procedure Call Back phone  # [phone]  Permission to leave phone message Yes  Some recent data might be hidden     Patient questions:  Do you have a fever, pain , or abdominal swelling? No. Pain Score  0 *  Have you tolerated food without any problems? Yes.    Have you been able to return to your normal activities? Yes.    Do you have any questions about your discharge instructions: Diet   No. Medications  No. Follow up visit  No.  Do you have questions or concerns about your Care? No.  Actions: * If pain score is 4 or above: No action needed, pain <4.

## 2018-09-14 ENCOUNTER — Telehealth: Payer: Self-pay | Admitting: Gastroenterology

## 2018-09-14 NOTE — Telephone Encounter (Signed)
Reviewed result letter with pt.

## 2018-09-14 NOTE — Telephone Encounter (Signed)
Pt had EGD 2.27.20 and inquired about biopsy results.

## 2018-09-24 ENCOUNTER — Other Ambulatory Visit: Payer: Self-pay | Admitting: Internal Medicine

## 2018-09-24 MED ORDER — LORATADINE 10 MG PO TABS: ORAL_TABLET | ORAL | 0 refills | Status: DC

## 2018-09-24 NOTE — Telephone Encounter (Signed)
Copied from [place] [id number]. Topic: Quick Communication - Rx Refill/Question >> Sep 24, 2018 11:03 AM Sheran Luz wrote: Medication: loratadine (CLARITIN) 10 MG tablet    Patient is requesting a refill of this medication    Preferred Pharmacy (with phone number or street name):[facility] [place], [place]  [phone] (Phone) [phone] (Fax)

## 2018-11-15 ENCOUNTER — Other Ambulatory Visit: Payer: Self-pay | Admitting: Internal Medicine

## 2018-12-05 ENCOUNTER — Encounter: Payer: Self-pay | Admitting: Internal Medicine

## 2018-12-05 DIAGNOSIS — M0579 Rheumatoid arthritis with rheumatoid factor of multiple sites without organ or systems involvement: Secondary | ICD-10-CM | POA: Diagnosis not present

## 2018-12-19 DIAGNOSIS — M0579 Rheumatoid arthritis with rheumatoid factor of multiple sites without organ or systems involvement: Secondary | ICD-10-CM | POA: Diagnosis not present

## 2018-12-20 DIAGNOSIS — Z01419 Encounter for gynecological examination (general) (routine) without abnormal findings: Secondary | ICD-10-CM | POA: Diagnosis not present

## 2018-12-20 DIAGNOSIS — N951 Menopausal and female climacteric states: Secondary | ICD-10-CM | POA: Diagnosis not present

## 2018-12-20 DIAGNOSIS — M069 Rheumatoid arthritis, unspecified: Secondary | ICD-10-CM | POA: Diagnosis not present

## 2018-12-20 DIAGNOSIS — Z1231 Encounter for screening mammogram for malignant neoplasm of breast: Secondary | ICD-10-CM | POA: Diagnosis not present

## 2018-12-20 DIAGNOSIS — Z6831 Body mass index (BMI) 31.0-31.9, adult: Secondary | ICD-10-CM | POA: Diagnosis not present

## 2018-12-20 DIAGNOSIS — E039 Hypothyroidism, unspecified: Secondary | ICD-10-CM | POA: Diagnosis not present

## 2019-01-24 ENCOUNTER — Telehealth: Payer: Self-pay | Admitting: Internal Medicine

## 2019-01-24 MED ORDER — LORATADINE 10 MG PO TABS: ORAL_TABLET | ORAL | 3 refills | Status: DC

## 2019-01-24 NOTE — Telephone Encounter (Signed)
Medication: loratadine (CLARITIN) 10 MG tablet [[id number]]  Has the patient contacted their pharmacy? Yes  (Agent: If no, request that the patient contact the pharmacy for the refill.) (Agent: If yes, when and what did the pharmacy advise?)  Preferred Pharmacy (with phone number or street name): [id number] - [place], [place] [phone] (Phone) [phone] (Fax    Agent: Please be advised that RX refills may take up to 3 business days. We ask that you follow-up with your pharmacy.

## 2019-01-24 NOTE — Telephone Encounter (Signed)
erx sent

## 2019-01-29 DIAGNOSIS — M0579 Rheumatoid arthritis with rheumatoid factor of multiple sites without organ or systems involvement: Secondary | ICD-10-CM | POA: Diagnosis not present

## 2019-01-29 DIAGNOSIS — E669 Obesity, unspecified: Secondary | ICD-10-CM | POA: Diagnosis not present

## 2019-01-29 DIAGNOSIS — Z6831 Body mass index (BMI) 31.0-31.9, adult: Secondary | ICD-10-CM | POA: Diagnosis not present

## 2019-01-29 DIAGNOSIS — H2513 Age-related nuclear cataract, bilateral: Secondary | ICD-10-CM | POA: Diagnosis not present

## 2019-01-29 DIAGNOSIS — R7612 Nonspecific reaction to cell mediated immunity measurement of gamma interferon antigen response without active tuberculosis: Secondary | ICD-10-CM | POA: Diagnosis not present

## 2019-01-29 DIAGNOSIS — M810 Age-related osteoporosis without current pathological fracture: Secondary | ICD-10-CM | POA: Diagnosis not present

## 2019-01-29 DIAGNOSIS — H04123 Dry eye syndrome of bilateral lacrimal glands: Secondary | ICD-10-CM | POA: Diagnosis not present

## 2019-01-29 DIAGNOSIS — H40013 Open angle with borderline findings, low risk, bilateral: Secondary | ICD-10-CM | POA: Diagnosis not present

## 2019-01-29 DIAGNOSIS — H40033 Anatomical narrow angle, bilateral: Secondary | ICD-10-CM | POA: Diagnosis not present

## 2019-02-13 ENCOUNTER — Other Ambulatory Visit: Payer: Self-pay | Admitting: Internal Medicine

## 2019-03-02 ENCOUNTER — Other Ambulatory Visit: Payer: Self-pay | Admitting: Internal Medicine

## 2019-03-06 DIAGNOSIS — M810 Age-related osteoporosis without current pathological fracture: Secondary | ICD-10-CM | POA: Diagnosis not present

## 2019-03-08 ENCOUNTER — Telehealth: Payer: Self-pay

## 2019-03-08 NOTE — Telephone Encounter (Signed)
Pt wanted to let you know that she is stopping nexium. She is doing ok without it and shw read that it had too many side effects. Will call back is she needs something else in place of that medication.

## 2019-03-08 NOTE — Telephone Encounter (Signed)
Noted. Med list updated.

## 2019-03-08 NOTE — Telephone Encounter (Signed)
Copied from [place] [id number]. Topic: General - Other >> Mar 06, 2019  2:52 PM Celene Kras A wrote: Reason for CRM: Pt called stating she would like to be taken on the nexium. Please advise.

## 2019-03-21 ENCOUNTER — Ambulatory Visit: Payer: Medicare Other

## 2019-03-21 ENCOUNTER — Ambulatory Visit (INDEPENDENT_AMBULATORY_CARE_PROVIDER_SITE_OTHER): Payer: Medicare Other

## 2019-03-21 DIAGNOSIS — Z23 Encounter for immunization: Secondary | ICD-10-CM

## 2019-04-07 NOTE — Progress Notes (Signed)
Virtual Visit via Video Note  I connected with [name] on 04/07/19 at 11:15 AM EDT by a video enabled telemedicine application and verified that I am speaking with the correct person using two identifiers.   I discussed the limitations of evaluation and management by telemedicine and the availability of in person appointments. The patient expressed understanding and agreed to proceed.  The patient is currently at home and I am in the office.    No referring provider.    History of Present Illness: This is an acute visit for memory loss and strange taste in mouth.  2 days ago she had an episode of memory loss.  She was running around doing errands all day, had a pedicure and later in the day realized that she did not recall most of the events of the day.  She did feel disorientated and recalled having a weird taste in her mouth that lasted approximately 10 minutes.  She has recall the events of the day at this point.  She has not had any issues with her memory since then and overall feels her memory is good.  She was very concerned about this episode.  She does not have a history of migraines.  She denies any head trauma.  She does have increased stress, but nothing too extreme.  She did not experience any numbness, tingling, weakness or other concerning symptoms.   Social History   Socioeconomic History  . Marital status: Married    Spouse name: Not on file  . Number of children: 0  . Years of education: Not on file  . Highest education level: Not on file  Occupational History  . Occupation: retired    Comment: Software engineer of [employer]  . Financial resource strain: Not on file  . Food insecurity    Worry: Not on file    Inability: Not on file  . Transportation needs    Medical: Not on file    Non-medical: Not on file  Tobacco Use  . Smoking status: Never Smoker  . Smokeless tobacco: Never Used  Substance and Sexual Activity  . Alcohol use: Yes    Comment: 1 glass of  wine  . Drug use: No  . Sexual activity: Not on file  Lifestyle  . Physical activity    Days per week: Not on file    Minutes per session: Not on file  . Stress: Not on file  Relationships  . Social Herbalist on phone: Not on file    Gets together: Not on file    Attends religious service: Not on file    Active member of club or organization: Not on file    Attends meetings of clubs or organizations: Not on file    Relationship status: Not on file  Other Topics Concern  . Not on file  Social History Narrative   Retired 07/10/17 from [employer]   No regular exercise     Observations/Objective: Appears well in NAD Breathing normally Nonfocal on exam Normal mood and affect  Assessment and Plan:  See Problem List for Assessment and Plan of chronic medical problems.   Follow Up Instructions:    I discussed the assessment and treatment plan with the patient. The patient was provided an opportunity to ask questions and all were answered. The patient agreed with the plan and demonstrated an understanding of the instructions.   The patient was advised to call back or seek an in-person evaluation if  the symptoms worsen or if the condition fails to improve as anticipated.    [name], MD

## 2019-04-08 ENCOUNTER — Ambulatory Visit (INDEPENDENT_AMBULATORY_CARE_PROVIDER_SITE_OTHER): Payer: Medicare Other | Admitting: Internal Medicine

## 2019-04-08 ENCOUNTER — Encounter: Payer: Self-pay | Admitting: Internal Medicine

## 2019-04-08 DIAGNOSIS — R413 Other amnesia: Secondary | ICD-10-CM

## 2019-04-08 DIAGNOSIS — I2510 Atherosclerotic heart disease of native coronary artery without angina pectoris: Secondary | ICD-10-CM

## 2019-04-08 NOTE — Assessment & Plan Note (Signed)
2 days ago had an episode of amnesia that lasted several hours She did feel disorientated and had a weird taste in her mouth that lasted approximately 10 minutes Symptoms suggestive of transient global amnesia No obvious cause and no concerning symptoms Discussed that we could have her see neurology for further evaluation and to confirm TGA, but she deferred for now She would like to monitor and will let me know if she has any other concerning symptoms

## 2019-05-14 ENCOUNTER — Other Ambulatory Visit: Payer: Self-pay | Admitting: Internal Medicine

## 2019-05-22 DIAGNOSIS — M0579 Rheumatoid arthritis with rheumatoid factor of multiple sites without organ or systems involvement: Secondary | ICD-10-CM | POA: Diagnosis not present

## 2019-05-22 DIAGNOSIS — Z79899 Other long term (current) drug therapy: Secondary | ICD-10-CM | POA: Diagnosis not present

## 2019-05-24 ENCOUNTER — Other Ambulatory Visit: Payer: Self-pay | Admitting: Internal Medicine

## 2019-05-24 NOTE — Telephone Encounter (Signed)
Copied from [place] [id number]. Topic: Quick Communication - Rx Refill/Question >> May 24, 2019 11:20 AM Rainey Pines A wrote: Medication: valACYclovir (VALTREX) 500 MG tablet (Patient requesting refill on medication and if possible the generic version as well.) (90 quantity)  Has the patient contacted their pharmacy? Yes (Agent: If no, request that the patient contact the pharmacy for the refill.) (Agent: If yes, when and what did the pharmacy advise?)Contact PCP  Preferred Pharmacy (with phone number or street name): [id number] - [place], [place] [phone] (Phone) [phone] (Fax)    Agent: Please be advised that RX refills may take up to 3 business days. We ask that you follow-up with your pharmacy.

## 2019-06-01 DIAGNOSIS — R05 Cough: Secondary | ICD-10-CM | POA: Diagnosis not present

## 2019-06-01 DIAGNOSIS — Z1159 Encounter for screening for other viral diseases: Secondary | ICD-10-CM | POA: Diagnosis not present

## 2019-06-05 DIAGNOSIS — J019 Acute sinusitis, unspecified: Secondary | ICD-10-CM | POA: Diagnosis not present

## 2019-06-05 DIAGNOSIS — M0579 Rheumatoid arthritis with rheumatoid factor of multiple sites without organ or systems involvement: Secondary | ICD-10-CM | POA: Diagnosis not present

## 2019-07-10 ENCOUNTER — Telehealth: Payer: Self-pay

## 2019-07-10 NOTE — Telephone Encounter (Signed)
Copied from [place] [id number]. Topic: General - Other >> Jul 10, 2019  3:06 PM Rayann Heman wrote: Reason for CRM: pt called and stated that she would like a call back on when she will be able to receive covid vaccine. Pt would just like to know round about time

## 2019-07-16 NOTE — Telephone Encounter (Signed)
LVM letting pt know that we do not have any extra info on the vaccine at this time.

## 2019-07-25 ENCOUNTER — Telehealth: Payer: Self-pay | Admitting: Internal Medicine

## 2019-07-25 NOTE — Telephone Encounter (Signed)
Copied from [place] [id number]. Topic: General - Call Back - No Documentation >> Jul 25, 2019  3:55 PM Erick Blinks wrote: Reason for CRM: Pt wants to know if she should take the vaccine considering her conditions with cancer Best contact: [phone]

## 2019-07-26 NOTE — Telephone Encounter (Signed)
Pt and husband has an appointment to get vaccines on Friday at 3:00.

## 2019-07-26 NOTE — Telephone Encounter (Signed)
She should get the vaccine

## 2019-08-02 ENCOUNTER — Ambulatory Visit: Payer: Medicare Other | Attending: Internal Medicine

## 2019-08-02 DIAGNOSIS — Z23 Encounter for immunization: Secondary | ICD-10-CM | POA: Insufficient documentation

## 2019-08-02 NOTE — Progress Notes (Signed)
   Covid-19 Vaccination Clinic  Name:  Julia [name]    MRN: [id number] DOB: 07-30-1950  08/02/2019  [name] was observed post Covid-19 immunization for 15 minutes without incidence. She was provided with Vaccine Information Sheet and instruction to access the V-Safe system.   [name] was instructed to call 911 with any severe reactions post vaccine: Marland Kitchen Difficulty breathing  . Swelling of your face and throat  . A fast heartbeat  . A bad rash all over your body  . Dizziness and weakness    Immunizations Administered    Name Date Dose VIS Date Route   Pfizer COVID-19 Vaccine 08/02/2019  3:12 PM 0.3 mL 06/21/2019 Intramuscular   Manufacturer: [place]   Lot: [id number]   [place]: [id number]

## 2019-08-12 ENCOUNTER — Other Ambulatory Visit: Payer: Self-pay | Admitting: Internal Medicine

## 2019-08-19 DIAGNOSIS — M0579 Rheumatoid arthritis with rheumatoid factor of multiple sites without organ or systems involvement: Secondary | ICD-10-CM | POA: Diagnosis not present

## 2019-08-19 DIAGNOSIS — Z6831 Body mass index (BMI) 31.0-31.9, adult: Secondary | ICD-10-CM | POA: Diagnosis not present

## 2019-08-19 DIAGNOSIS — R7612 Nonspecific reaction to cell mediated immunity measurement of gamma interferon antigen response without active tuberculosis: Secondary | ICD-10-CM | POA: Diagnosis not present

## 2019-08-19 DIAGNOSIS — M810 Age-related osteoporosis without current pathological fracture: Secondary | ICD-10-CM | POA: Diagnosis not present

## 2019-08-19 DIAGNOSIS — E669 Obesity, unspecified: Secondary | ICD-10-CM | POA: Diagnosis not present

## 2019-08-20 ENCOUNTER — Telehealth: Payer: Self-pay | Admitting: Internal Medicine

## 2019-08-20 DIAGNOSIS — E039 Hypothyroidism, unspecified: Secondary | ICD-10-CM

## 2019-08-20 DIAGNOSIS — M069 Rheumatoid arthritis, unspecified: Secondary | ICD-10-CM

## 2019-08-20 DIAGNOSIS — M810 Age-related osteoporosis without current pathological fracture: Secondary | ICD-10-CM

## 2019-08-20 DIAGNOSIS — Z9884 Bariatric surgery status: Secondary | ICD-10-CM

## 2019-08-20 DIAGNOSIS — K219 Gastro-esophageal reflux disease without esophagitis: Secondary | ICD-10-CM

## 2019-08-20 DIAGNOSIS — E559 Vitamin D deficiency, unspecified: Secondary | ICD-10-CM

## 2019-08-20 DIAGNOSIS — Z0000 Encounter for general adult medical examination without abnormal findings: Secondary | ICD-10-CM

## 2019-08-20 NOTE — Telephone Encounter (Signed)
Ordered.  Insurance should pay for it.Marland KitchenMarland KitchenMarland Kitchen

## 2019-08-20 NOTE — Telephone Encounter (Signed)
   Request order for bone density

## 2019-08-21 NOTE — Telephone Encounter (Signed)
Pt aware. Would like CPE labs put in before her appointment. Lab appointment scheduled.

## 2019-08-21 NOTE — Telephone Encounter (Signed)
Labs ordered.    Not sure if dexa was scheduled already - if not needs to be scheduled

## 2019-08-22 ENCOUNTER — Ambulatory Visit: Payer: Medicare Other | Attending: Internal Medicine

## 2019-08-22 DIAGNOSIS — Z23 Encounter for immunization: Secondary | ICD-10-CM | POA: Insufficient documentation

## 2019-08-22 NOTE — Progress Notes (Signed)
   Covid-19 Vaccination Clinic  Name:  [name]    MRN: [id number] DOB: Nov 22, 1950  08/22/2019  [name] was observed post Covid-19 immunization for 15 minutes without incidence. She was provided with Vaccine Information Sheet and instruction to access the V-Safe system.   [name] was instructed to call 911 with any severe reactions post vaccine: Marland Kitchen Difficulty breathing  . Swelling of your face and throat  . A fast heartbeat  . A bad rash all over your body  . Dizziness and weakness    Immunizations Administered    Name Date Dose VIS Date Route   Pfizer COVID-19 Vaccine 08/22/2019 11:03 AM 0.3 mL 06/21/2019 Intramuscular   Manufacturer: [place]   Lot: [id number]   [place]: [id number]

## 2019-08-27 DIAGNOSIS — D2272 Melanocytic nevi of left lower limb, including hip: Secondary | ICD-10-CM | POA: Diagnosis not present

## 2019-08-27 DIAGNOSIS — L570 Actinic keratosis: Secondary | ICD-10-CM | POA: Diagnosis not present

## 2019-08-27 DIAGNOSIS — D485 Neoplasm of uncertain behavior of skin: Secondary | ICD-10-CM | POA: Diagnosis not present

## 2019-08-27 DIAGNOSIS — D225 Melanocytic nevi of trunk: Secondary | ICD-10-CM | POA: Diagnosis not present

## 2019-08-27 DIAGNOSIS — D2261 Melanocytic nevi of right upper limb, including shoulder: Secondary | ICD-10-CM | POA: Diagnosis not present

## 2019-08-27 DIAGNOSIS — L821 Other seborrheic keratosis: Secondary | ICD-10-CM | POA: Diagnosis not present

## 2019-08-30 DIAGNOSIS — I10 Essential (primary) hypertension: Secondary | ICD-10-CM | POA: Insufficient documentation

## 2019-08-30 NOTE — Progress Notes (Signed)
Subjective:    Patient ID: Julia [name], female    DOB: Mar 15, 1951, 69 y.o.   MRN: [id number]  HPI The patient is here for follow up of their chronic medical problems, including hypertension, hypothyroidism, GERD.  She is exercising irregularly.   She would like to lose weight.  Overall, she is doing well.    Medications and allergies reviewed with patient and updated if appropriate.  Patient Active Problem List   Diagnosis Date Noted  . Hypertension 08/30/2019  . Amnesia memory loss 04/08/2019  . Atypical chest pain 07/20/2018  . Chest tightness 07/17/2018  . Vitamin D deficiency 06/22/2017  . Hypothyroidism 01/14/2017  . Altered sensation, foot 01/14/2017  . History of tuberculosis exposure 01/14/2017  . Osteoporosis 01/14/2017  . History of gastric bypass 01/14/2017  . History of colon polyps 01/14/2017  . Rheumatoid arthritis ([place]) 01/13/2017  . GERD (gastroesophageal reflux disease) 01/13/2017    Current Outpatient Medications on File Prior to Visit  Medication Sig Dispense Refill  . Calcium Carbonate-Vitamin D 600-200 MG-UNIT TABS Take by mouth 2 (two) times daily.    . Cholecalciferol (VITAMIN D3) 1000 units CAPS Take by mouth.    Marland Kitchen glucosamine-chondroitin 500-400 MG tablet Take 1 tablet by mouth 2 (two) times daily.     Marland Kitchen KRILL OIL PO Take 1,085 mg by mouth.    . levothyroxine (SYNTHROID) 25 MCG tablet TAKE 1 TABLET BY MOUTH DAILY 90 tablet 0  . loratadine (CLARITIN) 10 MG tablet Take 1 tablet daily. 90 tablet 3  . Multiple Vitamins-Minerals (THERA-M) TABS Take by mouth.    . Omega-3 Fatty Acids (FISH OIL) 1000 MG CAPS Take by mouth.    . orlistat (ALLI) 60 MG capsule Take 60 mg by mouth 3 (three) times daily with meals.    Marland Kitchen PROAIR HFA 108 (90 Base) MCG/ACT inhaler inhale 2 puffs by mouth every 4 hours if needed for wheezing  0  . Pseudoephedrine-guaiFENesin (MUCINEX D PO) Take 600 mg by mouth.    . pyridoxine (B-6) 100 MG tablet Take 100 mg by mouth 2 (two)  times daily.    . riTUXimab (RITUXAN) 500 MG/50ML injection Inject into the vein.    Marland Kitchen sucralfate (CARAFATE) 1 GM/10ML suspension Take 10 mLs (1 g total) by mouth 4 (four) times daily as needed. 420 mL 0  . TURMERIC CURCUMIN PO Take 500 mg by mouth 2 (two) times daily.     . valACYclovir (VALTREX) 500 MG tablet Take 1 tablet (500 mg) by mouth daily. Need office visit for more refills. 30 tablet 0  . [DISCONTINUED] amLODipine (NORVASC) 5 MG tablet TAKE 1 TABLET(5 MG) BY MOUTH DAILY 90 tablet 1   No current facility-administered medications on file prior to visit.    Past Medical History:  Diagnosis Date  . H/O breast augmentation   . History of gastric bypass   . Pelvis fracture ([place])    in 30's  . Pneumonia   . RA (rheumatoid arthritis) ([place])     Past Surgical History:  Procedure Laterality Date  . APPENDECTOMY    . GASTRIC BYPASS    . TONSILLECTOMY      Social History   Socioeconomic History  . Marital status: Married    Spouse name: Not on file  . Number of children: 0  . Years of education: Not on file  . Highest education level: Not on file  Occupational History  . Occupation: retired    Comment: president of [employer]  Tobacco  Use  . Smoking status: Never Smoker  . Smokeless tobacco: Never Used  Substance and Sexual Activity  . Alcohol use: Yes    Comment: 1 glass of wine  . Drug use: No  . Sexual activity: Not on file  Other Topics Concern  . Not on file  Social History Narrative   Retired 07/10/17 from [employer]   No regular exercise   Social Determinants of Health   Financial Resource Strain:   . Difficulty of Paying Living Expenses: Not on file  Food Insecurity:   . Worried About Charity fundraiser in the Last Year: Not on file  . Ran Out of Food in the Last Year: Not on file  Transportation Needs:   . Lack of Transportation (Medical): Not on file  . Lack of Transportation (Non-Medical): Not on file  Physical Activity:   . Days of Exercise per Week: Not on  file  . Minutes of Exercise per Session: Not on file  Stress:   . Feeling of Stress : Not on file  Social Connections:   . Frequency of Communication with Friends and Family: Not on file  . Frequency of Social Gatherings with Friends and Family: Not on file  . Attends Religious Services: Not on file  . Active Member of Clubs or Organizations: Not on file  . Attends Archivist Meetings: Not on file  . Marital Status: Not on file    Family History  Problem Relation Age of Onset  . Hypertension Mother   . Heart disease Mother   . Hypertension Father   . Ulcers Father   . Gallstones Father   . Hypertension Sister   . Hypertension Brother   . Heart attack Maternal Grandmother     Review of Systems  Constitutional: Negative for chills, fatigue and fever.  Eyes: Negative for visual disturbance.  Respiratory: Negative for cough, shortness of breath and wheezing.   Cardiovascular: Negative for chest pain, palpitations and leg swelling.  Gastrointestinal: Negative for abdominal pain, blood in stool, constipation, diarrhea and nausea.       Occ GERD  Genitourinary: Negative for dysuria and hematuria.  Musculoskeletal: Negative for arthralgias and back pain.  Skin: Negative for color change and rash.  Neurological: Negative for dizziness, light-headedness and headaches.  Psychiatric/Behavioral: Negative for dysphoric mood. The patient is not nervous/anxious.        Objective:   Vitals:   09/02/19 0943  BP: 118/76  Pulse: (!) 50  Resp: 16  Temp: 98.3 F (36.8 C)  SpO2: 99%   BP Readings from Last 3 Encounters:  09/02/19 118/76  09/06/18 (!) 128/56  09/03/18 (!) 146/92   Wt Readings from Last 3 Encounters:  09/02/19 183 lb (83 kg)  09/06/18 183 lb (83 kg)  09/03/18 183 lb 8 oz (83.2 kg)   Body mass index is 32.94 kg/m.   Physical Exam    Constitutional: She appears well-developed and well-nourished. No distress.  HENT:  Head: Normocephalic and  atraumatic.  Right Ear: External ear normal. Normal ear canal and TM Left Ear: External ear normal.  Normal ear canal and TM Mouth/Throat: Oropharynx is clear and moist.  Eyes: Conjunctivae and EOM are normal.  Neck: Neck supple. No tracheal deviation present. No thyromegaly present.  No carotid bruit  Cardiovascular: Normal rate, regular rhythm and normal heart sounds.   No murmur heard.  No edema. Pulmonary/Chest: Effort normal and breath sounds normal. No respiratory distress. She has no wheezes. She has no  rales.  Abdominal: Soft. She exhibits no distension. There is no tenderness.  Lymphadenopathy: She has no cervical adenopathy.  Skin: Skin is warm and dry. She is not diaphoretic.  Psychiatric: She has a normal mood and affect. Her behavior is normal.       Assessment & Plan:    Health Maintenance  Topic Date Due  . PNA vac Low Risk Adult (2 of 2 - PPSV23) 07/18/2019  . MAMMOGRAM  12/13/2019  . DEXA SCAN  08/01/2020  . COLONOSCOPY  05/23/2021  . TETANUS/TDAP  10/18/2021  . INFLUENZA VACCINE  Completed  . Hepatitis C Screening  Completed    HM up to date.  She has seen the dentist, eye doctor and dermatologist.   See Problem List for Assessment and Plan of chronic medical problems.    This visit occurred during the SARS-CoV-2 public health emergency.  Safety protocols were in place, including screening questions prior to the visit, additional usage of staff PPE, and extensive cleaning of exam room while observing appropriate contact time as indicated for disinfecting solutions.

## 2019-08-30 NOTE — Patient Instructions (Addendum)
Blood work was ordered.     Medications reviewed and updated.  Changes include :   Decrease amlodipine to 2.5 mg daily  Your prescription(s) have been submitted to your pharmacy. Please take as directed and contact our office if you believe you are having problem(s) with the medication(s).    Please followup in 1 year for follow up    Health Maintenance, Female Adopting a healthy lifestyle and getting preventive care are important in promoting health and wellness. Ask your health care provider about:  The right schedule for you to have regular tests and exams.  Things you can do on your own to prevent diseases and keep yourself healthy. What should I know about diet, weight, and exercise? Eat a healthy diet   Eat a diet that includes plenty of vegetables, fruits, low-fat dairy products, and lean protein.  Do not eat a lot of foods that are high in solid fats, added sugars, or sodium. Maintain a healthy weight Body mass index (BMI) is used to identify weight problems. It estimates body fat based on height and weight. Your health care provider can help determine your BMI and help you achieve or maintain a healthy weight. Get regular exercise Get regular exercise. This is one of the most important things you can do for your health. Most adults should:  Exercise for at least 150 minutes each week. The exercise should increase your heart rate and make you sweat (moderate-intensity exercise).  Do strengthening exercises at least twice a week. This is in addition to the moderate-intensity exercise.  Spend less time sitting. Even light physical activity can be beneficial. Watch cholesterol and blood lipids Have your blood tested for lipids and cholesterol at 67 years of age, then have this test every 5 years. Have your cholesterol levels checked more often if:  Your lipid or cholesterol levels are high.  You are older than 67 years of age.  You are at high risk for heart  disease. What should I know about cancer screening? Depending on your health history and family history, you may need to have cancer screening at various ages. This may include screening for:  Breast cancer.  Cervical cancer.  Colorectal cancer.  Skin cancer.  Lung cancer. What should I know about heart disease, diabetes, and high blood pressure? Blood pressure and heart disease  High blood pressure causes heart disease and increases the risk of stroke. This is more likely to develop in people who have high blood pressure readings, are of African descent, or are overweight.  Have your blood pressure checked: ? Every 3-5 years if you are 43-41 years of age. ? Every year if you are 16 years old or older. Diabetes Have regular diabetes screenings. This checks your fasting blood sugar level. Have the screening done:  Once every three years after age 57 if you are at a normal weight and have a low risk for diabetes.  More often and at a younger age if you are overweight or have a high risk for diabetes. What should I know about preventing infection? Hepatitis B If you have a higher risk for hepatitis B, you should be screened for this virus. Talk with your health care provider to find out if you are at risk for hepatitis B infection. Hepatitis C Testing is recommended for:  Everyone born from [birth year] through 1963.  Anyone with known risk factors for hepatitis C. Sexually transmitted infections (STIs)  Get screened for STIs, including gonorrhea and chlamydia, if: ?  You are sexually active and are younger than 67 years of age. ? You are older than 67 years of age and your health care provider tells you that you are at risk for this type of infection. ? Your sexual activity has changed since you were last screened, and you are at increased risk for chlamydia or gonorrhea. Ask your health care provider if you are at risk.  Ask your health care provider about whether you are at high  risk for HIV. Your health care provider may recommend a prescription medicine to help prevent HIV infection. If you choose to take medicine to prevent HIV, you should first get tested for HIV. You should then be tested every 3 months for as long as you are taking the medicine. Pregnancy  If you are about to stop having your period (premenopausal) and you may become pregnant, seek counseling before you get pregnant.  Take 400 to 800 micrograms (mcg) of folic acid every day if you become pregnant.  Ask for birth control (contraception) if you want to prevent pregnancy. Osteoporosis and menopause Osteoporosis is a disease in which the bones lose minerals and strength with aging. This can result in bone fractures. If you are 41 years old or older, or if you are at risk for osteoporosis and fractures, ask your health care provider if you should:  Be screened for bone loss.  Take a calcium or vitamin D supplement to lower your risk of fractures.  Be given hormone replacement therapy (HRT) to treat symptoms of menopause. Follow these instructions at home: Lifestyle  Do not use any products that contain nicotine or tobacco, such as cigarettes, e-cigarettes, and chewing tobacco. If you need help quitting, ask your health care provider.  Do not use street drugs.  Do not share needles.  Ask your health care provider for help if you need support or information about quitting drugs. Alcohol use  Do not drink alcohol if: ? Your health care provider tells you not to drink. ? You are pregnant, may be pregnant, or are planning to become pregnant.  If you drink alcohol: ? Limit how much you use to 0-1 drink a day. ? Limit intake if you are breastfeeding.  Be aware of how much alcohol is in your drink. In the [place] one drink equals one 12 oz bottle of beer (355 mL), one 5 oz glass of wine (148 mL), or one 1 oz glass of hard liquor (44 mL). General instructions  Schedule regular health, dental,  and eye exams.  Stay current with your vaccines.  Tell your health care provider if: ? You often feel depressed. ? You have ever been abused or do not feel safe at home. Summary  Adopting a healthy lifestyle and getting preventive care are important in promoting health and wellness.  Follow your health care provider's instructions about healthy diet, exercising, and getting tested or screened for diseases.  Follow your health care provider's instructions on monitoring your cholesterol and blood pressure. This information is not intended to replace advice given to you by your health care provider. Make sure you discuss any questions you have with your health care provider. Document Revised: 06/20/2018 Document Reviewed: 06/20/2018 Elsevier Patient Education  2020 [employer].

## 2019-09-02 ENCOUNTER — Other Ambulatory Visit: Payer: Medicare Other

## 2019-09-02 ENCOUNTER — Ambulatory Visit (INDEPENDENT_AMBULATORY_CARE_PROVIDER_SITE_OTHER): Payer: Medicare Other | Admitting: Internal Medicine

## 2019-09-02 ENCOUNTER — Other Ambulatory Visit: Payer: Self-pay

## 2019-09-02 ENCOUNTER — Encounter: Payer: Self-pay | Admitting: Internal Medicine

## 2019-09-02 ENCOUNTER — Other Ambulatory Visit (INDEPENDENT_AMBULATORY_CARE_PROVIDER_SITE_OTHER): Payer: Medicare Other

## 2019-09-02 VITALS — BP 118/76 | HR 50 | Temp 98.3°F | Resp 16 | Ht 62.5 in | Wt 183.0 lb

## 2019-09-02 DIAGNOSIS — E039 Hypothyroidism, unspecified: Secondary | ICD-10-CM

## 2019-09-02 DIAGNOSIS — E559 Vitamin D deficiency, unspecified: Secondary | ICD-10-CM

## 2019-09-02 DIAGNOSIS — M069 Rheumatoid arthritis, unspecified: Secondary | ICD-10-CM

## 2019-09-02 DIAGNOSIS — K219 Gastro-esophageal reflux disease without esophagitis: Secondary | ICD-10-CM

## 2019-09-02 DIAGNOSIS — I10 Essential (primary) hypertension: Secondary | ICD-10-CM

## 2019-09-02 DIAGNOSIS — M810 Age-related osteoporosis without current pathological fracture: Secondary | ICD-10-CM

## 2019-09-02 DIAGNOSIS — Z0000 Encounter for general adult medical examination without abnormal findings: Secondary | ICD-10-CM | POA: Diagnosis not present

## 2019-09-02 DIAGNOSIS — Z9884 Bariatric surgery status: Secondary | ICD-10-CM | POA: Diagnosis not present

## 2019-09-02 LAB — LIPID PANEL
Cholesterol: 169 mg/dL (ref 0–200)
HDL: 73.3 mg/dL (ref >39.00)
LDL Cholesterol: 83 mg/dL (ref 0–99)
NonHDL: 95.48
Total CHOL/HDL Ratio: 2
Triglycerides: 62 mg/dL (ref 0.0–149.0)
VLDL: 12.4 mg/dL (ref 0.0–40.0)

## 2019-09-02 LAB — COMPREHENSIVE METABOLIC PANEL
ALT: 25 U/L (ref 0–35)
AST: 26 U/L (ref 0–37)
Albumin: 4.7 g/dL (ref 3.5–5.2)
Alkaline Phosphatase: 44 U/L (ref 39–117)
BUN: 12 mg/dL (ref 6–23)
CO2: 26 mEq/L (ref 19–32)
Calcium: 9.7 mg/dL (ref 8.4–10.5)
Chloride: 105 mEq/L (ref 96–112)
Creatinine, Ser: 0.53 mg/dL (ref 0.40–1.20)
GFR: 115.29 mL/min (ref >60.00)
Glucose, Bld: 86 mg/dL (ref 70–99)
Potassium: 3.8 mEq/L (ref 3.5–5.1)
Sodium: 142 mEq/L (ref 135–145)
Total Bilirubin: 0.4 mg/dL (ref 0.2–1.2)
Total Protein: 7.1 g/dL (ref 6.0–8.3)

## 2019-09-02 LAB — CBC WITH DIFFERENTIAL/PLATELET
Basophils Absolute: 0.1 10*3/uL (ref 0.0–0.1)
Basophils Relative: 1.2 % (ref 0.0–3.0)
Eosinophils Absolute: 0.1 10*3/uL (ref 0.0–0.7)
Eosinophils Relative: 1.8 % (ref 0.0–5.0)
HCT: 40.6 % (ref 36.0–46.0)
Hemoglobin: 13.4 g/dL (ref 12.0–15.0)
Lymphocytes Relative: 21.2 % (ref 12.0–46.0)
Lymphs Abs: 1.2 10*3/uL (ref 0.7–4.0)
MCHC: 33 g/dL (ref 30.0–36.0)
MCV: 95.2 fl (ref 78.0–100.0)
Monocytes Absolute: 0.8 10*3/uL (ref 0.1–1.0)
Monocytes Relative: 13.9 % — ABNORMAL HIGH (ref 3.0–12.0)
Neutro Abs: 3.6 10*3/uL (ref 1.4–7.7)
Neutrophils Relative %: 61.9 % (ref 43.0–77.0)
Platelets: 382 10*3/uL (ref 150.0–400.0)
RBC: 4.27 Mil/uL (ref 3.87–5.11)
RDW: 13.2 % (ref 11.5–15.5)
WBC: 5.9 10*3/uL (ref 4.0–10.5)

## 2019-09-02 LAB — TSH: TSH: 1.45 u[IU]/mL (ref 0.35–4.50)

## 2019-09-02 LAB — VITAMIN D 25 HYDROXY (VIT D DEFICIENCY, FRACTURES): VITD: 35.95 ng/mL (ref 30.00–100.00)

## 2019-09-02 MED ORDER — AMLODIPINE BESYLATE 2.5 MG PO TABS: 2.5000 mg | ORAL_TABLET | Freq: Every day | ORAL | 3 refills | Status: DC

## 2019-09-02 MED ORDER — VALACYCLOVIR HCL 500 MG PO TABS: ORAL_TABLET | ORAL | 3 refills | Status: DC

## 2019-09-02 NOTE — Assessment & Plan Note (Signed)
Chronic  Clinically euthyroid Check tsh  Titrate med dose if needed  

## 2019-09-02 NOTE — Assessment & Plan Note (Signed)
Chronic Following with Dr. Amil Amen Overall pain very well controlled on Rituxan

## 2019-09-02 NOTE — Assessment & Plan Note (Addendum)
Takes tums as needed Working on avoiding foods that cause GERD Working on weight loss

## 2019-09-02 NOTE — Assessment & Plan Note (Signed)
Chronic BP well controlled - often on lower side - asymptomatic Will try amlodipine 2.5 mg daily Monitor BP at home Cmp

## 2019-09-02 NOTE — Assessment & Plan Note (Addendum)
dexa ordered/scheduled On prolia via Dr. Amil Amen Taking calcium and vitamin d Exercising minimally-we will increase

## 2019-09-03 ENCOUNTER — Encounter: Payer: Self-pay | Admitting: Internal Medicine

## 2019-09-11 ENCOUNTER — Other Ambulatory Visit: Payer: Self-pay | Admitting: Internal Medicine

## 2019-09-11 ENCOUNTER — Ambulatory Visit (INDEPENDENT_AMBULATORY_CARE_PROVIDER_SITE_OTHER)
Admission: RE | Admit: 2019-09-11 | Discharge: 2019-09-11 | Disposition: A | Discharge status: Home / Self Care | Payer: Medicare Other | Source: Ambulatory Visit | Attending: Internal Medicine | Admitting: Internal Medicine

## 2019-09-11 ENCOUNTER — Other Ambulatory Visit: Payer: Self-pay

## 2019-09-11 DIAGNOSIS — M810 Age-related osteoporosis without current pathological fracture: Secondary | ICD-10-CM | POA: Diagnosis not present

## 2019-09-12 ENCOUNTER — Encounter: Payer: Self-pay | Admitting: Internal Medicine

## 2019-10-10 DIAGNOSIS — H40033 Anatomical narrow angle, bilateral: Secondary | ICD-10-CM | POA: Diagnosis not present

## 2019-10-10 DIAGNOSIS — H04123 Dry eye syndrome of bilateral lacrimal glands: Secondary | ICD-10-CM | POA: Diagnosis not present

## 2019-10-10 DIAGNOSIS — H40013 Open angle with borderline findings, low risk, bilateral: Secondary | ICD-10-CM | POA: Diagnosis not present

## 2019-10-10 DIAGNOSIS — H2513 Age-related nuclear cataract, bilateral: Secondary | ICD-10-CM | POA: Diagnosis not present

## 2019-10-16 ENCOUNTER — Other Ambulatory Visit: Payer: Self-pay | Admitting: Internal Medicine

## 2019-10-29 ENCOUNTER — Ambulatory Visit (INDEPENDENT_AMBULATORY_CARE_PROVIDER_SITE_OTHER): Payer: Medicare Other | Admitting: Internal Medicine

## 2019-10-29 ENCOUNTER — Encounter: Payer: Self-pay | Admitting: Internal Medicine

## 2019-10-29 ENCOUNTER — Other Ambulatory Visit: Payer: Self-pay

## 2019-10-29 VITALS — BP 134/72 | HR 75 | Temp 97.9°F | Resp 16 | Ht 62.5 in | Wt 183.0 lb

## 2019-10-29 DIAGNOSIS — Z23 Encounter for immunization: Secondary | ICD-10-CM | POA: Diagnosis not present

## 2019-10-29 DIAGNOSIS — R109 Unspecified abdominal pain: Secondary | ICD-10-CM | POA: Insufficient documentation

## 2019-10-29 DIAGNOSIS — R10A1 Flank pain, right side: Secondary | ICD-10-CM | POA: Insufficient documentation

## 2019-10-29 LAB — CBC WITH DIFFERENTIAL/PLATELET
Basophils Absolute: 0.1 10*3/uL (ref 0.0–0.1)
Basophils Relative: 0.8 % (ref 0.0–3.0)
Eosinophils Absolute: 0.1 10*3/uL (ref 0.0–0.7)
Eosinophils Relative: 1.7 % (ref 0.0–5.0)
HCT: 38.7 % (ref 36.0–46.0)
Hemoglobin: 12.8 g/dL (ref 12.0–15.0)
Lymphocytes Relative: 12.5 % (ref 12.0–46.0)
Lymphs Abs: 0.8 10*3/uL (ref 0.7–4.0)
MCHC: 33.2 g/dL (ref 30.0–36.0)
MCV: 94.7 fl (ref 78.0–100.0)
Monocytes Absolute: 1.4 10*3/uL — ABNORMAL HIGH (ref 0.1–1.0)
Monocytes Relative: 21 % — ABNORMAL HIGH (ref 3.0–12.0)
Neutro Abs: 4.3 10*3/uL (ref 1.4–7.7)
Neutrophils Relative %: 64 % (ref 43.0–77.0)
Platelets: 337 10*3/uL (ref 150.0–400.0)
RBC: 4.09 Mil/uL (ref 3.87–5.11)
RDW: 13.8 % (ref 11.5–15.5)
WBC: 6.7 10*3/uL (ref 4.0–10.5)

## 2019-10-29 LAB — COMPREHENSIVE METABOLIC PANEL
ALT: 14 U/L (ref 0–35)
AST: 20 U/L (ref 0–37)
Albumin: 4.2 g/dL (ref 3.5–5.2)
Alkaline Phosphatase: 58 U/L (ref 39–117)
BUN: 11 mg/dL (ref 6–23)
CO2: 29 mEq/L (ref 19–32)
Calcium: 9.4 mg/dL (ref 8.4–10.5)
Chloride: 103 mEq/L (ref 96–112)
Creatinine, Ser: 0.58 mg/dL (ref 0.40–1.20)
GFR: 103.85 mL/min (ref >60.00)
Glucose, Bld: 92 mg/dL (ref 70–99)
Potassium: 3.9 mEq/L (ref 3.5–5.1)
Sodium: 138 mEq/L (ref 135–145)
Total Bilirubin: 0.4 mg/dL (ref 0.2–1.2)
Total Protein: 6.9 g/dL (ref 6.0–8.3)

## 2019-10-29 NOTE — Assessment & Plan Note (Signed)
Acute-started 6-7 days ago She has been more physically active outside and this could be musculoskeletal.  At times seems like it may be associated with certain movements, but not consistent No urinary or GI symptoms so seems less likely to be UTI, renal stone or constipation Check UA, urine culture, CBC, CMP We will treat as musculoskeletal at this time, but if no improvement may need imaging Take Tylenol twice daily regularly Try heat, ice and topical muscle/arthritis medications Decrease activity and see if rest helps She will call if no improvement

## 2019-10-29 NOTE — Progress Notes (Signed)
Subjective:    Patient ID: Julia [name], female    DOB: 04-13-1951, 69 y.o.   MRN: [id number]  HPI The patient is here for an acute visit.   Right waist pain started 6-7 days ago.  She has been doing a lot outside with lifting and moving things and initially thought she pulled a muscle.  The pain is in her right side/flank.  At times it radiates toward the back.  Yesterday the pain was fairly constant.  Nothing seems to make it worse as far as movement or position.  She did take Tylenol and that seemed to help.  She describes the pain as a dull ache.  She has been more thirsty and is unsure if that is related.  She goes to the bathroom frequently and that is unchanged.  She denies any blood in urine or frequent urination.  She denies any fever, nausea or change in bowel habits.  She has not had any abdominal pain.  She denies any numbness or tingling.  Her spine does not hurt.  She has never had pain like this before, which is why she came in.  No history of kidney stones.  Medications and allergies reviewed with patient and updated if appropriate.  Patient Active Problem List   Diagnosis Date Noted  . Hypertension 08/30/2019  . Amnesia memory loss 04/08/2019  . Atypical chest pain 07/20/2018  . Chest tightness 07/17/2018  . Vitamin D deficiency 06/22/2017  . Hypothyroidism 01/14/2017  . Altered sensation, foot 01/14/2017  . History of tuberculosis exposure 01/14/2017  . Osteoporosis 01/14/2017  . History of gastric bypass 01/14/2017  . History of colon polyps 01/14/2017  . Rheumatoid arthritis ([place]) 01/13/2017  . GERD (gastroesophageal reflux disease) 01/13/2017    Current Outpatient Medications on File Prior to Visit  Medication Sig Dispense Refill  . amLODipine (NORVASC) 2.5 MG tablet Take 1 tablet (2.5 mg total) by mouth daily. 90 tablet 3  . Ascorbic Acid (VITAMIN C PO) Take 1,600 mg by mouth.    . Calcium Carbonate-Vit D-Min (CALCIUM 1200 PO) Take by mouth.    .  Calcium Carbonate-Vitamin D 600-200 MG-UNIT TABS Take by mouth 2 (two) times daily.    . Cholecalciferol (VITAMIN D3) 1000 units CAPS Take by mouth.    . ELDERBERRY PO Take by mouth.    Marland Kitchen glucosamine-chondroitin 500-400 MG tablet Take 1 tablet by mouth 2 (two) times daily.     Marland Kitchen KRILL OIL PO Take 1,085 mg by mouth.    . levothyroxine (SYNTHROID) 25 MCG tablet TAKE 1 TABLET BY MOUTH DAILY 90 tablet 0  . Multiple Vitamins-Minerals (THERA-M) TABS Take by mouth.    . NON FORMULARY Monolaurin and zinc    . Omega-3 Fatty Acids (FISH OIL) 1000 MG CAPS Take by mouth.    . orlistat (ALLI) 60 MG capsule Take 60 mg by mouth 3 (three) times daily with meals.    Marland Kitchen PROAIR HFA 108 (90 Base) MCG/ACT inhaler inhale 2 puffs by mouth every 4 hours if needed for wheezing  0  . Pseudoephedrine-guaiFENesin (MUCINEX D PO) Take 600 mg by mouth.    . pyridoxine (B-6) 100 MG tablet Take 100 mg by mouth 2 (two) times daily.    . riTUXimab (RITUXAN) 500 MG/50ML injection Inject into the vein.    . TURMERIC CURCUMIN PO Take 500 mg by mouth 2 (two) times daily.     . valACYclovir (VALTREX) 500 MG tablet TAKE 1 TABLET(500 MG) BY MOUTH DAILY  30 tablet 11   No current facility-administered medications on file prior to visit.    Past Medical History:  Diagnosis Date  . H/O breast augmentation   . History of gastric bypass   . Pelvis fracture ([place])    in 30's  . Pneumonia   . RA (rheumatoid arthritis) ([place])     Past Surgical History:  Procedure Laterality Date  . APPENDECTOMY    . GASTRIC BYPASS    . TONSILLECTOMY      Social History   Socioeconomic History  . Marital status: Married    Spouse name: Not on file  . Number of children: 0  . Years of education: Not on file  . Highest education level: Not on file  Occupational History  . 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 Smoking status: Never Smoker  . Smokeless tobacco: Never Used  Substance and Sexual Activity  . Alcohol  use: Yes    Comment: 1 glass of wine  . Drug use: No  . Sexual activity: Not on file  Other Topics Concern  . Not on file  Social History Narrative   Retired 07/10/17 from [employer]   No regular exercise   Social Determinants of Health   Financial Resource Strain:   . Difficulty of Paying Living Expenses:   Food Insecurity:   . Worried About Charity fundraiser in the Last Year:   . Arboriculturist in the Last Year:   Transportation Needs:   . Film/video editor (Medical):   Marland Kitchen Lack of Transportation (Non-Medical):   Physical Activity:   . Days of Exercise per Week:   . Minutes of Exercise per Session:   Stress:   . Feeling of Stress :   Social Connections:   . Frequency of Communication with Friends and Family:   . Frequency of Social Gatherings with Friends and Family:   . Attends Religious Services:   . Active Member of Clubs or Organizations:   . Attends Archivist Meetings:   Marland Kitchen Marital Status:     Family History  Problem Relation Age of Onset  . Hypertension Mother   . Heart disease Mother   . Hypertension Father   . Ulcers Father   . Gallstones Father   . Hypertension Sister   . Hypertension Brother   . Heart attack Maternal Grandmother     Review of Systems  Constitutional: Negative for fever.  Gastrointestinal: Negative for abdominal pain, blood in stool, constipation, diarrhea and nausea.  Genitourinary: Positive for flank pain (right back) and frequency (chronic, no change). Negative for dysuria and hematuria (no discoloration).       Occ incontinence  Musculoskeletal: Positive for back pain (right lower back pain).  Neurological: Negative for numbness.       Objective:   Vitals:   10/29/19 1332  BP: 134/72  Pulse: 75  Resp: 16  Temp: 97.9 F (36.6 C)  SpO2: 99%   BP Readings from Last 3 Encounters:  10/29/19 134/72  09/02/19 118/76  09/06/18 (!) 128/56   Wt Readings from Last 3 Encounters:  10/29/19 183 lb (83 kg)  09/02/19  183 lb (83 kg)  09/06/18 183 lb (83 kg)   Body mass index is 32.94 kg/m.   Physical Exam Constitutional:      General: She is not in acute distress.    Appearance: Normal appearance. She is not ill-appearing.  HENT:     Head:  Normocephalic and atraumatic.  Abdominal:     General: There is no distension.     Palpations: Abdomen is soft. There is no mass.     Tenderness: There is no abdominal tenderness. There is no right CVA tenderness, left CVA tenderness, guarding or rebound.     Hernia: No hernia is present.  Musculoskeletal:        General: Tenderness (Mild tenderness right flank between ribs and iliac crest.  Some right lower back pain.  No pain along the lower spine) present.     Comments: Increased pain when sitting up from the table  Skin:    General: Skin is warm and dry.     Findings: No erythema or rash.  Neurological:     Mental Status: She is alert.            Assessment & Plan:    See Problem List for Assessment and Plan of chronic medical problems.    This visit occurred during the SARS-CoV-2 public health emergency.  Safety protocols were in place, including screening questions prior to the visit, additional usage of staff PPE, and extensive cleaning of exam room while observing appropriate contact time as indicated for disinfecting solutions.

## 2019-10-29 NOTE — Patient Instructions (Addendum)
Have blood work and urine tests today.   Take tylenol, use heat, ice and topical muscle / arthritis medicaitons.   Please call if there is no improvement in your symptoms.  If there is no improvement we will consider imaging.

## 2019-10-30 ENCOUNTER — Encounter: Payer: Self-pay | Admitting: Internal Medicine

## 2019-10-30 DIAGNOSIS — R109 Unspecified abdominal pain: Secondary | ICD-10-CM

## 2019-10-30 LAB — URINALYSIS, ROUTINE W REFLEX MICROSCOPIC
Bilirubin Urine: NEGATIVE
Hgb urine dipstick: NEGATIVE
Ketones, ur: NEGATIVE
Leukocytes,Ua: NEGATIVE
Nitrite: NEGATIVE
RBC / HPF: NONE SEEN (ref 0–?)
Specific Gravity, Urine: 1.025 (ref 1.000–1.030)
Total Protein, Urine: NEGATIVE
Urine Glucose: NEGATIVE
Urobilinogen, UA: 0.2 (ref 0.0–1.0)
pH: 5.5 (ref 5.0–8.0)

## 2019-10-31 ENCOUNTER — Ambulatory Visit
Admission: RE | Admit: 2019-10-31 | Discharge: 2019-10-31 | Disposition: A | Discharge status: Home / Self Care | Payer: Medicare Other | Source: Ambulatory Visit | Attending: Internal Medicine | Admitting: Internal Medicine

## 2019-10-31 ENCOUNTER — Other Ambulatory Visit: Payer: Self-pay

## 2019-10-31 DIAGNOSIS — K439 Ventral hernia without obstruction or gangrene: Secondary | ICD-10-CM | POA: Diagnosis not present

## 2019-10-31 DIAGNOSIS — R109 Unspecified abdominal pain: Secondary | ICD-10-CM

## 2019-10-31 LAB — URINE CULTURE

## 2019-10-31 MED ORDER — CEPHALEXIN 500 MG PO CAPS: 500.0000 mg | ORAL_CAPSULE | Freq: Two times a day (BID) | ORAL | 0 refills | Status: DC

## 2019-10-31 NOTE — Addendum Note (Signed)
Addended by: Delice Bison E on: 10/31/2019 04:58 PM   Modules accepted: Orders

## 2019-11-01 ENCOUNTER — Telehealth: Payer: Self-pay | Admitting: Internal Medicine

## 2019-11-01 DIAGNOSIS — K439 Ventral hernia without obstruction or gangrene: Secondary | ICD-10-CM

## 2019-11-01 HISTORY — DX: Ventral hernia without obstruction or gangrene: K43.9

## 2019-11-01 MED ORDER — CEPHALEXIN 500 MG PO CAPS: 500.0000 mg | ORAL_CAPSULE | Freq: Two times a day (BID) | ORAL | 0 refills | Status: DC

## 2019-11-01 NOTE — Telephone Encounter (Signed)
ecoli is the most common bacteria that cause a uti.     The urethra that goes into the bladder is very close to the anus and often it can be from contamination - it is not a hygiene thing or anything is is doing wrong.

## 2019-11-01 NOTE — Telephone Encounter (Signed)
Pt aware of response below.   How do you get ecoli in your urine? She is very concerned about the ecoli being in her body and how it got there.

## 2019-11-01 NOTE — Telephone Encounter (Signed)
Sent mychart message with response. 

## 2019-11-01 NOTE — Telephone Encounter (Signed)
UTI's are very common and do become more common after menopause because of the lack of estrogen and changes this causes.    The urine confirms an infection.  Her Ct scan does not show any stones, but does indicate some inflammation in the bladder consistent with an infection.   The antibiotic should treat this and take care of it.  She may have future UTI's and if she experiences similar symptoms she should come in so we can check her urine.    The CT scan incidentally noted a hernia in her upper abdomen - this type of hernia typically does not cause any pain and we do not do anything about it unless she has discomfort.

## 2019-11-01 NOTE — Telephone Encounter (Signed)
Can you please explain her UTI in more detail? I am unsure why the pharmacy did not get the rx. Will resend.

## 2019-11-01 NOTE — Telephone Encounter (Signed)
New message:   Pt is calling and states a prescription was supposed to be called in yesterday for her for [facility] and she states the pharmacy has not received it. She also states she is not sure how serious to take this news and would like for someone to explain into more detail exactly what she has going on. Please advise.

## 2019-11-10 ENCOUNTER — Other Ambulatory Visit: Payer: Self-pay | Admitting: Internal Medicine

## 2019-11-20 DIAGNOSIS — M0579 Rheumatoid arthritis with rheumatoid factor of multiple sites without organ or systems involvement: Secondary | ICD-10-CM | POA: Diagnosis not present

## 2019-12-04 DIAGNOSIS — M0579 Rheumatoid arthritis with rheumatoid factor of multiple sites without organ or systems involvement: Secondary | ICD-10-CM | POA: Diagnosis not present

## 2019-12-05 ENCOUNTER — Encounter: Payer: Self-pay | Admitting: Internal Medicine

## 2019-12-25 DIAGNOSIS — N951 Menopausal and female climacteric states: Secondary | ICD-10-CM | POA: Diagnosis not present

## 2019-12-25 DIAGNOSIS — E039 Hypothyroidism, unspecified: Secondary | ICD-10-CM | POA: Diagnosis not present

## 2019-12-25 DIAGNOSIS — M069 Rheumatoid arthritis, unspecified: Secondary | ICD-10-CM | POA: Diagnosis not present

## 2019-12-25 DIAGNOSIS — Z780 Asymptomatic menopausal state: Secondary | ICD-10-CM | POA: Insufficient documentation

## 2019-12-25 DIAGNOSIS — Z1231 Encounter for screening mammogram for malignant neoplasm of breast: Secondary | ICD-10-CM | POA: Diagnosis not present

## 2019-12-25 DIAGNOSIS — Z0001 Encounter for general adult medical examination with abnormal findings: Secondary | ICD-10-CM | POA: Diagnosis not present

## 2019-12-25 HISTORY — DX: Asymptomatic menopausal state: Z78.0

## 2020-01-08 ENCOUNTER — Ambulatory Visit (INDEPENDENT_AMBULATORY_CARE_PROVIDER_SITE_OTHER): Payer: Medicare Other | Admitting: Internal Medicine

## 2020-01-08 ENCOUNTER — Other Ambulatory Visit: Payer: Self-pay

## 2020-01-08 ENCOUNTER — Encounter: Payer: Self-pay | Admitting: Internal Medicine

## 2020-01-08 VITALS — BP 126/80 | HR 87 | Temp 98.1°F | Ht 62.5 in | Wt 183.0 lb

## 2020-01-08 DIAGNOSIS — R109 Unspecified abdominal pain: Secondary | ICD-10-CM | POA: Diagnosis not present

## 2020-01-08 DIAGNOSIS — F4323 Adjustment disorder with mixed anxiety and depressed mood: Secondary | ICD-10-CM | POA: Insufficient documentation

## 2020-01-08 DIAGNOSIS — L255 Unspecified contact dermatitis due to plants, except food: Secondary | ICD-10-CM

## 2020-01-08 DIAGNOSIS — F419 Anxiety disorder, unspecified: Secondary | ICD-10-CM

## 2020-01-08 HISTORY — DX: Unspecified contact dermatitis due to plants, except food: L25.5

## 2020-01-08 MED ORDER — TRIAMCINOLONE ACETONIDE 0.5 % EX OINT: 1.0000 "application " | TOPICAL_OINTMENT | Freq: Two times a day (BID) | CUTANEOUS | 0 refills | Status: DC

## 2020-01-08 MED ORDER — VENLAFAXINE HCL ER 37.5 MG PO CP24: 37.5000 mg | ORAL_CAPSULE | Freq: Every day | ORAL | 1 refills | Status: DC

## 2020-01-08 NOTE — Patient Instructions (Addendum)
  Urine tests was ordered.     Medications reviewed and updated.  Changes include :   Triamcinolone cream twice a day on the rash.   Start venlafaxine 37.5 mg daily   Your prescription(s) have been submitted to your pharmacy. Please take as directed and contact our office if you believe you are having problem(s) with the medication(s).

## 2020-01-08 NOTE — Progress Notes (Signed)
Subjective:    Patient ID: Julia [name], female    DOB: Jan 17, 1951, 69 y.o.   MRN: [id number]  HPI The patient is here for an acute visit.   She started to have lower back and right flank pain and was concerned about a UTI.  These are the exact same symptoms she had 2 months ago when she was diagnosed with a UTI.  She denies any changes in her urination.  5 days ago she got a red patch on her right lower leg.  She was concerned because it has gotten better.  She has been putting on bacitracin.  It itches.  She denies any fevers or chills.  She was concerned about the beginnings of cellulitis.  She has increased frustration causing increased irritability toward her husband.  He has some memory issues and that is primarily what is causing frustration.  She is interested in considering medication to help her become less irritated.  Medications and allergies reviewed with patient and updated if appropriate.  Patient Active Problem List   Diagnosis Date Noted   Ventral hernia without obstruction or gangrene 11/01/2019   Acute right flank pain 10/29/2019   Hypertension 08/30/2019   Amnesia memory loss 04/08/2019   Atypical chest pain 07/20/2018   Chest tightness 07/17/2018   Vitamin D deficiency 06/22/2017   Hypothyroidism 01/14/2017   Altered sensation, foot 01/14/2017   History of tuberculosis exposure 01/14/2017   Osteoporosis 01/14/2017   History of gastric bypass 01/14/2017   History of colon polyps 01/14/2017   Rheumatoid arthritis ([place]) 01/13/2017   GERD (gastroesophageal reflux disease) 01/13/2017    Current Outpatient Medications on File Prior to Visit  Medication Sig Dispense Refill   amLODipine (NORVASC) 2.5 MG tablet Take 1 tablet (2.5 mg total) by mouth daily. 90 tablet 3   Ascorbic Acid (VITAMIN C PO) Take 1,600 mg by mouth.     Calcium Carbonate-Vit D-Min (CALCIUM 1200 PO) Take by mouth.     Calcium Carbonate-Vitamin D 600-200 MG-UNIT TABS Take  by mouth 2 (two) times daily.     Cholecalciferol (VITAMIN D3) 1000 units CAPS Take by mouth.     ELDERBERRY PO Take by mouth.     glucosamine-chondroitin 500-400 MG tablet Take 1 tablet by mouth 2 (two) times daily.      KRILL OIL PO Take 1,085 mg by mouth.     levothyroxine (SYNTHROID) 25 MCG tablet TAKE 1 TABLET BY MOUTH DAILY 90 tablet 0   Loratadine (CLARITIN PO) Take by mouth.     Multiple Vitamins-Minerals (THERA-M) TABS Take by mouth.     NON FORMULARY Monolaurin and zinc     Omega-3 Fatty Acids (FISH OIL) 1000 MG CAPS Take by mouth.     orlistat (ALLI) 60 MG capsule Take 60 mg by mouth 3 (three) times daily with meals.     PROAIR HFA 108 (90 Base) MCG/ACT inhaler inhale 2 puffs by mouth every 4 hours if needed for wheezing  0   Pseudoephedrine-guaiFENesin (MUCINEX D PO) Take 600 mg by mouth.     pyridoxine (B-6) 100 MG tablet Take 100 mg by mouth 2 (two) times daily.     riTUXimab (RITUXAN) 500 MG/50ML injection Inject into the vein.     TURMERIC CURCUMIN PO Take 500 mg by mouth 2 (two) times daily.      valACYclovir (VALTREX) 500 MG tablet TAKE 1 TABLET(500 MG) BY MOUTH DAILY 30 tablet 11   No current facility-administered medications on file prior to  visit.    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[employer]  Tobacco Use   Smoking status: Never Smoker   Smokeless tobacco: Never Used  Substance and Sexual Activity   Alcohol use:  Yes    Comment: 1 glass of wine   Drug use: No   Sexual activity: Not on file  Other Topics Concern   Not on file  Social History Narrative   Retired 07/10/17 from [employer]   No regular exercise   Social Determinants of Health   Financial Resource Strain:    Difficulty of Paying Living Expenses:   Food Insecurity:    Worried About Charity fundraiser in the Last Year:    Arboriculturist in the Last Year:   Transportation Needs:    Film/video editor (Medical):    Lack of Transportation (Non-Medical):   Physical Activity:    Days of Exercise per Week:    Minutes of Exercise per Session:   Stress:    Feeling of Stress :   Social Connections:    Frequency of Communication with Friends and Family:    Frequency of Social Gatherings with Friends and Family:    Attends Religious Services:    Active Member of Clubs or Organizations:    Attends Music therapist:    Marital Status:     Family History  Problem Relation Age of Onset   Hypertension Mother    Heart disease Mother    Hypertension Father    Ulcers Father    Gallstones Father    Hypertension Sister    Hypertension Brother    Heart attack Maternal Grandmother     Review of Systems  Constitutional: Negative for chills and fever.  Gastrointestinal: Negative for abdominal pain and nausea.  Genitourinary: Positive for flank pain (right side) and frequency (chronic). Negative for dysuria and hematuria.       No change in urine color/smell  Musculoskeletal: Positive for back pain (right lower back).  Skin: Positive for rash.       Objective:   Vitals:   01/08/20 1459  BP: 126/80  Pulse: 87  Temp: 98.1 F (36.7 C)  SpO2: 97%   BP Readings from Last 3 Encounters:  01/08/20 126/80  10/29/19 134/72  09/02/19 118/76   Wt Readings from Last 3 Encounters:  01/08/20 183 lb (83 kg)  10/29/19 183 lb (83 kg)  09/02/19 183 lb (83 kg)   Body mass index is 32.94 kg/m.    Physical Exam Constitutional:      General: She is not in acute distress.    Appearance: Normal appearance. She is not ill-appearing.  Abdominal:     General: There is no distension.     Palpations: Abdomen is soft.     Tenderness: There is no abdominal tenderness.  There is no right CVA tenderness, left CVA tenderness, guarding or rebound.  Musculoskeletal:     Right lower leg: No edema.     Left lower leg: No edema.  Skin:    General: Skin is warm and dry.     Findings: Rash (Right lower leg-appears to be a scratch with several papules surrounding) present.  Neurological:     Mental Status: She is alert.  Psychiatric:        Mood and Affect: Mood normal.            Assessment & Plan:    See Problem List for Assessment and Plan of chronic medical problems.    This visit occurred during the SARS-CoV-2 public health emergency.  Safety protocols were in place, including screening questions prior to the visit, additional usage of staff PPE, and extensive cleaning of exam room while observing appropriate contact time as indicated for disinfecting solutions.

## 2020-01-08 NOTE — Assessment & Plan Note (Signed)
Acute Rash on right leg lower leg looks like plant dermatitis Triamcinolone cream twice daily Call if no improvement

## 2020-01-08 NOTE — Assessment & Plan Note (Signed)
Acute Similar to her symptoms 2 months ago-she was diagnosed with UTI at that time No change in urination, but she did not have any urinary symptoms at that time Check urinalysis, urine culture Exam is benign We will await culture results before starting treatment

## 2020-01-08 NOTE — Assessment & Plan Note (Signed)
New problem Increased anxiety, irritability related to her husband's memory issue She would like to prevent to getting irritated so easily and would like to consider medication Trial of venlafaxine 37.5 mg daily

## 2020-01-09 ENCOUNTER — Telehealth: Payer: Self-pay | Admitting: Internal Medicine

## 2020-01-09 LAB — URINALYSIS, ROUTINE W REFLEX MICROSCOPIC
Bilirubin Urine: NEGATIVE
Hgb urine dipstick: NEGATIVE
Ketones, ur: NEGATIVE
Leukocytes,Ua: NEGATIVE
Nitrite: NEGATIVE
RBC / HPF: NONE SEEN (ref 0–?)
Specific Gravity, Urine: 1.025 (ref 1.000–1.030)
Total Protein, Urine: NEGATIVE
Urine Glucose: NEGATIVE
Urobilinogen, UA: 0.2 (ref 0.0–1.0)
pH: 5.5 (ref 5.0–8.0)

## 2020-01-09 NOTE — Telephone Encounter (Signed)
New message:   Pt is calling and states she would like to know the results of her urine test from yesterday. I explained to the pt that the results just came in this morning at 9:55 and Dr. Quay Burow still has not reviewed them. Please advise.

## 2020-01-09 NOTE — Telephone Encounter (Signed)
My-chart message sent and read.  Seen by patient Julia [name] on 01/09/2020 11:00 AM

## 2020-01-10 ENCOUNTER — Other Ambulatory Visit: Payer: Self-pay | Admitting: Internal Medicine

## 2020-01-10 LAB — URINE CULTURE

## 2020-01-10 MED ORDER — CEPHALEXIN 500 MG PO CAPS: 500.0000 mg | ORAL_CAPSULE | Freq: Two times a day (BID) | ORAL | 0 refills | Status: AC

## 2020-01-10 NOTE — Telephone Encounter (Signed)
    Calling to discuss  culture results

## 2020-01-14 NOTE — Telephone Encounter (Signed)
Spoke with patient today. She picked up abx that was sent in on 01/10/20.

## 2020-02-08 ENCOUNTER — Other Ambulatory Visit: Payer: Self-pay | Admitting: Internal Medicine

## 2020-02-14 ENCOUNTER — Emergency Department (HOSPITAL_COMMUNITY): Payer: Medicare Other

## 2020-02-14 ENCOUNTER — Emergency Department (HOSPITAL_COMMUNITY)
Admission: EM | Admit: 2020-02-14 | Discharge: 2020-02-14 | Disposition: A | Discharge status: Home / Self Care | Payer: Medicare Other | Attending: Emergency Medicine | Admitting: Emergency Medicine

## 2020-02-14 DIAGNOSIS — R4182 Altered mental status, unspecified: Secondary | ICD-10-CM | POA: Diagnosis not present

## 2020-02-14 DIAGNOSIS — R0789 Other chest pain: Secondary | ICD-10-CM | POA: Diagnosis not present

## 2020-02-14 DIAGNOSIS — R112 Nausea with vomiting, unspecified: Secondary | ICD-10-CM | POA: Diagnosis not present

## 2020-02-14 DIAGNOSIS — R29818 Other symptoms and signs involving the nervous system: Secondary | ICD-10-CM | POA: Diagnosis not present

## 2020-02-14 DIAGNOSIS — R4789 Other speech disturbances: Secondary | ICD-10-CM | POA: Insufficient documentation

## 2020-02-14 DIAGNOSIS — R4781 Slurred speech: Secondary | ICD-10-CM | POA: Diagnosis not present

## 2020-02-14 DIAGNOSIS — E039 Hypothyroidism, unspecified: Secondary | ICD-10-CM | POA: Insufficient documentation

## 2020-02-14 DIAGNOSIS — R42 Dizziness and giddiness: Secondary | ICD-10-CM | POA: Insufficient documentation

## 2020-02-14 DIAGNOSIS — Z79899 Other long term (current) drug therapy: Secondary | ICD-10-CM | POA: Insufficient documentation

## 2020-02-14 DIAGNOSIS — I213 ST elevation (STEMI) myocardial infarction of unspecified site: Secondary | ICD-10-CM | POA: Diagnosis not present

## 2020-02-14 DIAGNOSIS — R0902 Hypoxemia: Secondary | ICD-10-CM | POA: Diagnosis not present

## 2020-02-14 DIAGNOSIS — R29810 Facial weakness: Secondary | ICD-10-CM | POA: Diagnosis not present

## 2020-02-14 DIAGNOSIS — Z79890 Hormone replacement therapy (postmenopausal): Secondary | ICD-10-CM | POA: Diagnosis not present

## 2020-02-14 DIAGNOSIS — R52 Pain, unspecified: Secondary | ICD-10-CM | POA: Diagnosis not present

## 2020-02-14 LAB — RAPID URINE DRUG SCREEN, HOSP PERFORMED
Amphetamines: NOT DETECTED
Barbiturates: NOT DETECTED
Benzodiazepines: POSITIVE — AB
Cocaine: NOT DETECTED
Opiates: NOT DETECTED
Tetrahydrocannabinol: POSITIVE — AB

## 2020-02-14 LAB — COMPREHENSIVE METABOLIC PANEL
ALT: 21 U/L (ref 0–44)
AST: 27 U/L (ref 15–41)
Albumin: 4.1 g/dL (ref 3.5–5.0)
Alkaline Phosphatase: 44 U/L (ref 38–126)
Anion gap: 11 (ref 5–15)
BUN: 14 mg/dL (ref 8–23)
CO2: 23 mmol/L (ref 22–32)
Calcium: 9.2 mg/dL (ref 8.9–10.3)
Chloride: 104 mmol/L (ref 98–111)
Creatinine, Ser: 0.62 mg/dL (ref 0.44–1.00)
GFR calc Af Amer: >60 mL/min (ref >60)
GFR calc non Af Amer: >60 mL/min (ref >60)
Glucose, Bld: 172 mg/dL — ABNORMAL HIGH (ref 70–99)
Potassium: 3.8 mmol/L (ref 3.5–5.1)
Sodium: 138 mmol/L (ref 135–145)
Total Bilirubin: 0.5 mg/dL (ref 0.3–1.2)
Total Protein: 6.4 g/dL — ABNORMAL LOW (ref 6.5–8.1)

## 2020-02-14 LAB — DIFFERENTIAL
Abs Immature Granulocytes: 0.03 10*3/uL (ref 0.00–0.07)
Basophils Absolute: 0.1 10*3/uL (ref 0.0–0.1)
Basophils Relative: 1 %
Eosinophils Absolute: 0.1 10*3/uL (ref 0.0–0.5)
Eosinophils Relative: 2 %
Immature Granulocytes: 0 %
Lymphocytes Relative: 14 %
Lymphs Abs: 1 10*3/uL (ref 0.7–4.0)
Monocytes Absolute: 0.9 10*3/uL (ref 0.1–1.0)
Monocytes Relative: 12 %
Neutro Abs: 5.1 10*3/uL (ref 1.7–7.7)
Neutrophils Relative %: 71 %

## 2020-02-14 LAB — I-STAT CHEM 8, ED
BUN: 17 mg/dL (ref 8–23)
Calcium, Ion: 1.16 mmol/L (ref 1.15–1.40)
Chloride: 103 mmol/L (ref 98–111)
Creatinine, Ser: 0.5 mg/dL (ref 0.44–1.00)
Glucose, Bld: 172 mg/dL — ABNORMAL HIGH (ref 70–99)
HCT: 42 % (ref 36.0–46.0)
Hemoglobin: 14.3 g/dL (ref 12.0–15.0)
Potassium: 3.7 mmol/L (ref 3.5–5.1)
Sodium: 140 mmol/L (ref 135–145)
TCO2: 25 mmol/L (ref 22–32)

## 2020-02-14 LAB — URINALYSIS, ROUTINE W REFLEX MICROSCOPIC
Bilirubin Urine: NEGATIVE
Glucose, UA: NEGATIVE mg/dL
Hgb urine dipstick: NEGATIVE
Ketones, ur: NEGATIVE mg/dL
Leukocytes,Ua: NEGATIVE
Nitrite: NEGATIVE
Protein, ur: NEGATIVE mg/dL
Specific Gravity, Urine: >1.046 — ABNORMAL HIGH (ref 1.005–1.030)
pH: 5 (ref 5.0–8.0)

## 2020-02-14 LAB — PROTIME-INR
INR: 1 (ref 0.8–1.2)
Prothrombin Time: 12.3 seconds (ref 11.4–15.2)

## 2020-02-14 LAB — CBC
HCT: 40.6 % (ref 36.0–46.0)
Hemoglobin: 12.9 g/dL (ref 12.0–15.0)
MCH: 31 pg (ref 26.0–34.0)
MCHC: 31.8 g/dL (ref 30.0–36.0)
MCV: 97.6 fL (ref 80.0–100.0)
Platelets: 299 10*3/uL (ref 150–400)
RBC: 4.16 MIL/uL (ref 3.87–5.11)
RDW: 12.7 % (ref 11.5–15.5)
WBC: 7.1 10*3/uL (ref 4.0–10.5)
nRBC: 0 % (ref 0.0–0.2)

## 2020-02-14 LAB — CBG MONITORING, ED: Glucose-Capillary: 129 mg/dL — ABNORMAL HIGH (ref 70–99)

## 2020-02-14 MED ORDER — METOCLOPRAMIDE HCL 5 MG/ML IJ SOLN
10.0000 mg | Freq: Once | INTRAMUSCULAR | Status: AC
  Administered 2020-02-14: 10 mg via INTRAVENOUS

## 2020-02-14 MED ORDER — IOHEXOL 350 MG/ML SOLN
100.0000 mL | Freq: Once | INTRAVENOUS | Status: AC | PRN
  Administered 2020-02-14: 100 mL via INTRAVENOUS

## 2020-02-14 MED ORDER — METOCLOPRAMIDE HCL 10 MG PO TABS
10.0000 mg | ORAL_TABLET | Freq: Three times a day (TID) | ORAL | 0 refills | Status: DC | PRN
Start: 2020-02-14 — End: 2020-04-22

## 2020-02-14 MED ORDER — METOCLOPRAMIDE HCL 10 MG PO TABS
10.0000 mg | ORAL_TABLET | Freq: Three times a day (TID) | ORAL | 0 refills | Status: DC | PRN
Start: 2020-02-14 — End: 2020-02-14

## 2020-02-14 MED ORDER — DIAZEPAM 5 MG/ML IJ SOLN
5.0000 mg | Freq: Once | INTRAMUSCULAR | Status: AC
  Administered 2020-02-14: 5 mg via INTRAVENOUS
  Filled 2020-02-14: qty 2

## 2020-02-14 MED ORDER — DEXAMETHASONE SODIUM PHOSPHATE 10 MG/ML IJ SOLN
10.0000 mg | Freq: Once | INTRAMUSCULAR | Status: AC
  Administered 2020-02-14: 10 mg via INTRAVENOUS
  Filled 2020-02-14: qty 1

## 2020-02-14 MED ORDER — ONDANSETRON HCL 4 MG/2ML IJ SOLN
4.0000 mg | Freq: Once | INTRAMUSCULAR | Status: AC
  Administered 2020-02-14: 4 mg via INTRAVENOUS
  Filled 2020-02-14: qty 2

## 2020-02-14 NOTE — Discharge Instructions (Addendum)
You were seen in the ER for dizziness, nausea, vomiting, abnormal speech  Neurology evaluated you here.  No evidence of stroke. All neuroimaging was ok.   Cause of your symptoms is unclear.  We suspect it is either due to severe vertigo or a complex "migraine"   Take home medicine as prescribed. Use the reglan as needed if you develop repeat symptoms.   Return for worsening or new symptoms, severe sudden headache, persistent vomiting despite nausea medicine, stroke deficits like new abnormal speech changes, weakness, numbness, loss of vision or double vision

## 2020-02-14 NOTE — ED Notes (Signed)
Pure wick has been placed. Suction set to 45mmHg.  

## 2020-02-14 NOTE — ED Provider Notes (Signed)
°  Physical Exam  BP 122/73    Pulse 75    Temp 97.8 F (36.6 C)    Resp 20    SpO2 99%   Physical Exam  Gen: appears nontoxic Neuro: no speech difficulties. Pt able to ambulate.   ED Course/Procedures     Procedures  MDM   Pt signed out to me by Shary Key, PA-C.  Please see previous notes for further history.  In brief, patient presenting after becoming acutely dizzy with nausea and vomiting at 1130 this morning.  Additionally, she had vision changes which she describes as flashing and blinking lights.  She also reports a speech abnormality.  Code stroke was activated.  Initial CT and labs are negative.  MRI has been obtained, but final read is not published yet.  However neuro has evaluated the patient reviewed the imaging, no obvious abnormality.  Consider complex migraine versus peripheral vertigo.  Plan for treatment and reassess.  Patient has improved symptoms and is able to ambulate, plan for discharge.  On reassessment, patient remains without new neurologic deficits.  Able to ambulate, occasionally new treat for the wall but no falling or concerns for unsafe ambulation at home.  Discussed findings with patient and sister.  Discussed symptomatic treatment as needed, and follow-up outpatient with neurology.  At this time, patient appears safe for discharge.  Return precautions given.  Patient states she understands and agrees to plan.      Franchot Heidelberg, PA-C 02/14/20 1722    Virgel Manifold, MD 02/16/20 1520

## 2020-02-14 NOTE — Code Documentation (Addendum)
Stroke Response Nurse Documentation Code Documentation  Julia [name] is a 69 y.o. female arriving to [place] ED via EMS on 8/6 with past medical hx of Rheumatoid arthritis. Code stroke was activated by ED after patient was from Urgent Care where she was LKW at 1100 and now complaining of sudden onset of dizziness, blurred vision, and nausea. Stroke team at the bedside on patient arrival to CT. Labs drawn and patient cleared for CT by EDP. NIHSS 0, see documentation for details and code stroke times. Patient with blurred vision and abnormal speech cadence without dysarthria or aphasia on exam. The following imaging was completed:  CT, CTA head and neck. Taken to MRI where MD Leonel Ramsay noted no acute stroke on imaging. Patient is not a candidate for tPA due to stroke not suspected.  Care/Plan: q2 mNIHSS and VS. Bedside handoff with ED RN [place].     Kathrin Greathouse  Stroke Response RN

## 2020-02-14 NOTE — ED Triage Notes (Signed)
Pt presents from UC for HA, photophobia, lightheadedness, "everything blinking", N/V, changed speech pattern. LSW 11a by husband. EMS called ED, no code stroke activated in field. BIlat shoulder weakness related to RA.  4mg  zofran given for emesis, EKG unremarkable, dysarthria noted by EMS. A&Ox4  EMS v/s: CBG 200, 99% RA, 70HR, 118/68

## 2020-02-14 NOTE — ED Notes (Signed)
Pt arrived MRI

## 2020-02-14 NOTE — ED Notes (Signed)
Ambulated with pt, able to ambulate with standby assist, occasionally required to use wall or furniture to steady self during ambulation

## 2020-02-14 NOTE — ED Provider Notes (Signed)
[facility] Provider Note   CSN: [id number] Arrival date & time: 02/14/20  1321  An emergency department physician performed an initial assessment on this suspected stroke patient at 1400.  History Chief Complaint  Patient presents with  . Headache    Julia [name] is a 69 y.o. female with past medical history of RA presents by EMS for evaluation of sudden onset dizziness onset 1130 am.  Associated with difficulty opening her eyes because it makes her severely dizzy and nauseated.  Has had vomiting as well when she opens her eyes. States when she opens her eyes she sees "blinking". But denies blurred vision or loss of vision or double vision.  Feels better if she closes her eyes.  Denies feeling of room spinning. Denies headache, areas of numbness or extremity weakness.  Reports her speech is different, states she can hear and think but feels like she can't talk as well. Relative at bedside states her speech is odd as well.  Denies history of vertigo, stroke, migraines. Given zofran en route, vomiting here in ED.    HPI     Past Medical History:  Diagnosis Date  . H/O breast augmentation   . History of gastric bypass   . Pelvis fracture ([place])    in 30's  . Pneumonia   . RA (rheumatoid arthritis) [facility])     Patient Active Problem List   Diagnosis Date Noted  . Plant dermatitis 01/08/2020  . Anxiety 01/08/2020  . Ventral hernia without obstruction or gangrene 11/01/2019  . Right flank pain 10/29/2019  . Hypertension 08/30/2019  . Amnesia memory loss 04/08/2019  . Atypical chest pain 07/20/2018  . Chest tightness 07/17/2018  . Vitamin D deficiency 06/22/2017  . Hypothyroidism 01/14/2017  . Altered sensation, foot 01/14/2017  . History of tuberculosis exposure 01/14/2017  . Osteoporosis 01/14/2017  . History of gastric bypass 01/14/2017  . History of colon polyps 01/14/2017  . Rheumatoid arthritis ([place]) 01/13/2017  . GERD (gastroesophageal  reflux disease) 01/13/2017    Past Surgical History:  Procedure Laterality Date  . APPENDECTOMY    . GASTRIC BYPASS    . TONSILLECTOMY       OB History   No obstetric history on file.     Family History  Problem Relation Age of Onset  . Hypertension Mother   . Heart disease Mother   . Hypertension Father   . Ulcers Father   . Gallstones Father   . Hypertension Sister   . Hypertension Brother   . Heart attack Maternal Grandmother     Social History   Tobacco Use  . Smoking status: Never Smoker  . Smokeless tobacco: Never Used  Substance Use Topics  . Alcohol use: Yes    Comment: 1 glass of wine  . Drug use: No    Home Medications Prior to Admission medications   Medication Sig Start Date End Date Taking? Authorizing Provider  amLODipine (NORVASC) 2.5 MG tablet Take 1 tablet (2.5 mg total) by mouth daily. 09/02/19   [name], MD  Ascorbic Acid (VITAMIN C PO) Take 1,600 mg by mouth.    [provider]  Calcium Carbonate-Vit D-Min (CALCIUM 1200 PO) Take by mouth.    [provider]  Calcium Carbonate-Vitamin D 600-200 MG-UNIT TABS Take by mouth 2 (two) times daily.    [provider]  Cholecalciferol (VITAMIN D3) 1000 units CAPS Take by mouth. Patient takes BID    [provider]  Kendall Flack  PO Take by mouth. Patient takes BID    [provider]  glucosamine-chondroitin 500-400 MG tablet Take 1 tablet by mouth 2 (two) times daily.     [provider]  KRILL OIL PO Take 1,085 mg by mouth.    [provider]  levothyroxine (SYNTHROID) 25 MCG tablet TAKE 1 TABLET BY MOUTH DAILY 02/10/20   [name], MD  Loratadine (CLARITIN PO) Take by mouth.    [provider]  Multiple Vitamins-Minerals (MULTIPLE VITAMINS/WOMENS PO) Take by mouth.    [provider]  NON FORMULARY Monolaurin and zinc    [provider]  Omega-3 Fatty Acids (FISH OIL) 1000 MG CAPS Take by mouth.     [provider]  orlistat (ALLI) 60 MG capsule Take 60 mg by mouth 3 (three) times daily with meals.    [provider]  PROAIR HFA 108 [phone] Base) MCG/ACT inhaler inhale 2 puffs by mouth every 4 hours if needed for wheezing 01/09/17   [provider]  Pseudoephedrine-guaiFENesin (MUCINEX D PO) Take 600 mg by mouth.    [provider]  pyridoxine (B-6) 100 MG tablet Take 100 mg by mouth 2 (two) times daily.    [provider]  riTUXimab (RITUXAN) 500 MG/50ML injection Inject into the vein.    [provider]  triamcinolone ointment (KENALOG) 0.5 % Apply 1 application topically 2 (two) times daily. 01/08/20   [name], MD  TURMERIC CURCUMIN PO Take 500 mg by mouth 2 (two) times daily.     [provider]  valACYclovir (VALTREX) 500 MG tablet TAKE 1 TABLET(500 MG) BY MOUTH DAILY 09/11/19   [name], MD  venlafaxine XR (EFFEXOR XR) 37.5 MG 24 hr capsule Take 1 capsule (37.5 mg total) by mouth daily with breakfast. 01/08/20   [name], MD    Allergies    Patient has no known allergies.  Review of Systems   Review of Systems  Gastrointestinal: Positive for nausea and vomiting.  Neurological: Positive for dizziness.  All other systems reviewed and are negative.   Physical Exam Updated Vital Signs BP 103/62   Pulse (!) 54   Temp 97.8 F (36.6 C)   Resp 12   SpO2 99%   Physical Exam Vitals and nursing note reviewed.  Constitutional:      Appearance: She is well-developed.     Comments: Appears anxious. Keeps eyes closed during entirety of exam   HENT:     Head: Normocephalic and atraumatic.     Right Ear: External ear normal.     Left Ear: External ear normal.     Nose: Nose normal.  Eyes:     General: No scleral icterus.    Conjunctiva/sclera: Conjunctivae normal.     Comments: Difficult exam as patient has hard time keeping her eyes open.  When attempting to evaluate for nystagmus patient started dry  heaving and vomited.  Cardiovascular:     Rate and Rhythm: Normal rate and regular rhythm.     Heart sounds: Normal heart sounds.     Comments: Regular rate and rhythm Pulmonary:     Effort: Pulmonary effort is normal.     Breath sounds: Normal breath sounds.  Musculoskeletal:        General: No deformity. Normal range of motion.     Cervical back: Normal range of motion and neck supple.  Skin:    General: Skin is warm and dry.     Capillary  Refill: Capillary refill takes less than 2 seconds.  Neurological:     Mental Status: She is alert and oriented to person, place, and time.     Comments:   Mental Status: Patient is awake, alert, oriented to person, place, year, and situation. Patient is able to give a clear and coherent history. Speech pattern is odd, slow and with pauses. No frank dysarthria.  No signs of neglect.  Cranial Nerves: I not tested II unable to test visual fields. PERRL.   III, IV, VI EOMs intact without ptosis V sensation to light touch intact in all 3 divisions of trigeminal nerve bilaterally  VII facial movements symmetric bilaterally VIII hearing intact to voice/conversation  IX, X no uvula deviation, symmetric rise of soft palate/uvula XI 5/5 SCM and trapezius strength bilaterally  XII tongue protrusion midline, symmetric L/R movements  Motor: Strength 5/5 in upper/lower extremities .  Sensation to light touch intact in face, upper/lower extremities. No pronator drift. No leg drop.  Cerebellar:  Questionable ataxia with finger to nose bilaterally.  Unable to ambulate at this time.  Psychiatric:        Behavior: Behavior normal.        Thought Content: Thought content normal.        Judgment: Judgment normal.     ED Results / Procedures / Treatments   Labs (all labs ordered are listed, but only abnormal results are displayed) Labs Reviewed  COMPREHENSIVE METABOLIC PANEL - Abnormal; Notable for the following components:      Result Value   Glucose,  Bld 172 (*)    Total Protein 6.4 (*)    All other components within normal limits  I-STAT CHEM 8, ED - Abnormal; Notable for the following components:   Glucose, Bld 172 (*)    All other components within normal limits  CBG MONITORING, ED - Abnormal; Notable for the following components:   Glucose-Capillary 129 (*)    All other components within normal limits  CBC  DIFFERENTIAL  PROTIME-INR  ETHANOL  RAPID URINE DRUG SCREEN, HOSP PERFORMED  URINALYSIS, ROUTINE W REFLEX MICROSCOPIC  PROTIME-INR  APTT    EKG None  Radiology CT Code Stroke CTA Head W/WO contrast  Result Date: 02/14/2020 CLINICAL DATA:  Focal neuro deficit greater than 6 hours. Rule out stroke. EXAM: CT ANGIOGRAPHY HEAD AND NECK TECHNIQUE: Multidetector CT imaging of the head and neck was performed using the standard protocol during bolus administration of intravenous contrast. Multiplanar CT image reconstructions and MIPs were obtained to evaluate the vascular anatomy. Carotid stenosis measurements (when applicable) are obtained utilizing NASCET criteria, using the distal internal carotid diameter as the denominator. CONTRAST:  148mL OMNIPAQUE IOHEXOL 350 MG/ML SOLN COMPARISON:  CT head 02/14/2023 FINDINGS: CTA NECK FINDINGS Aortic arch: Standard branching. Imaged portion shows no evidence of aneurysm or dissection. No significant stenosis of the major arch vessel origins. No significant atherosclerotic disease in the aortic arch. Right carotid system: Normal right carotid without stenosis or dissection. Negative for atherosclerotic disease. Left carotid system: Normal left carotid. Negative for stenosis or dissection. Negative for atherosclerotic disease Vertebral arteries: Both vertebral arteries are normal and patent to the basilar. Left vertebral artery is dominant. Skeleton: Multilevel cervical spondylosis. No acute skeletal abnormality identified. Other neck: Negative Upper chest: Lung apices clear bilaterally. Review of  the MIP images confirms the above findings CTA HEAD FINDINGS Anterior circulation: Cavernous carotid widely patent bilaterally with minimal atherosclerotic calcification bilaterally. Anterior and middle cerebral arteries normal bilaterally. No stenosis or  large vessel occlusion Posterior circulation: Both vertebral arteries patent to the basilar. Right PICA patent. Left PICA not visualized. Dominant left AICA patent splaying the PICA territory. Basilar widely patent. Superior cerebellar and posterior cerebral arteries patent bilaterally. Fetal origin left posterior cerebral artery. Negative for large vessel occlusion. Venous sinuses: Normal venous enhancement Anatomic variants: None Review of the MIP images confirms the above findings IMPRESSION: 1. Normal CT angiogram of the head and neck. Negative for large vessel occlusion. No significant atherosclerotic disease. Electronically Signed   By: Franchot Gallo M.D.   On: 02/14/2020 14:40   CT Code Stroke CTA Neck W/WO contrast  Result Date: 02/14/2020 CLINICAL DATA:  Focal neuro deficit greater than 6 hours. Rule out stroke. EXAM: CT ANGIOGRAPHY HEAD AND NECK TECHNIQUE: Multidetector CT imaging of the head and neck was performed using the standard protocol during bolus administration of intravenous contrast. Multiplanar CT image reconstructions and MIPs were obtained to evaluate the vascular anatomy. Carotid stenosis measurements (when applicable) are obtained utilizing NASCET criteria, using the distal internal carotid diameter as the denominator. CONTRAST:  158mL OMNIPAQUE IOHEXOL 350 MG/ML SOLN COMPARISON:  CT head 02/14/2023 FINDINGS: CTA NECK FINDINGS Aortic arch: Standard branching. Imaged portion shows no evidence of aneurysm or dissection. No significant stenosis of the major arch vessel origins. No significant atherosclerotic disease in the aortic arch. Right carotid system: Normal right carotid without stenosis or dissection. Negative for atherosclerotic  disease. Left carotid system: Normal left carotid. Negative for stenosis or dissection. Negative for atherosclerotic disease Vertebral arteries: Both vertebral arteries are normal and patent to the basilar. Left vertebral artery is dominant. Skeleton: Multilevel cervical spondylosis. No acute skeletal abnormality identified. Other neck: Negative Upper chest: Lung apices clear bilaterally. Review of the MIP images confirms the above findings CTA HEAD FINDINGS Anterior circulation: Cavernous carotid widely patent bilaterally with minimal atherosclerotic calcification bilaterally. Anterior and middle cerebral arteries normal bilaterally. No stenosis or large vessel occlusion Posterior circulation: Both vertebral arteries patent to the basilar. Right PICA patent. Left PICA not visualized. Dominant left AICA patent splaying the PICA territory. Basilar widely patent. Superior cerebellar and posterior cerebral arteries patent bilaterally. Fetal origin left posterior cerebral artery. Negative for large vessel occlusion. Venous sinuses: Normal venous enhancement Anatomic variants: None Review of the MIP images confirms the above findings IMPRESSION: 1. Normal CT angiogram of the head and neck. Negative for large vessel occlusion. No significant atherosclerotic disease. Electronically Signed   By: Franchot Gallo M.D.   On: 02/14/2020 14:40   CT HEAD CODE STROKE WO CONTRAST  Result Date: 02/14/2020 CLINICAL DATA:  Code stroke.  Dizziness EXAM: CT HEAD WITHOUT CONTRAST TECHNIQUE: Contiguous axial images were obtained from the base of the skull through the vertex without intravenous contrast. COMPARISON:  None. FINDINGS: Brain: No acute infarct or intracranial hemorrhage. No mass lesion. No midline shift, ventriculomegaly or extra-axial fluid collection. Vascular: No hyperdense vessel or unexpected calcification. Minimal bilateral carotid siphon atherosclerotic calcifications. Skull: Negative for fracture or focal lesion.  Sinuses/Orbits: Normal orbits. Clear paranasal sinuses. No mastoid effusion. Other: None. ASPECTS [facility] Stroke Program Early CT Score) - Ganglionic level infarction (caudate, lentiform nuclei, internal capsule, insula, M1-M3 cortex): 7 - Supraganglionic infarction (M4-M6 cortex): 3 Total score (0-10 with 10 being normal): 10 IMPRESSION: 1. No acute intracranial abnormality. 2. ASPECTS is 10. Code stroke imaging results were communicated on 02/14/2020 at 2:13 pm to provider Dr. Leonel Ramsay via [facility] text page Electronically Signed   By: Primitivo Gauze M.D.   On:  02/14/2020 14:16    Procedures Procedures (including critical care time)  Medications Ordered in ED Medications  diazepam (VALIUM) injection 5 mg (has no administration in time range)  dexamethasone (DECADRON) injection 10 mg (has no administration in time range)  ondansetron (ZOFRAN) injection 4 mg (4 mg Intravenous Given 02/14/20 1402)  metoCLOPramide (REGLAN) injection 10 mg (10 mg Intravenous Given 02/14/20 1427)  iohexol (OMNIPAQUE) 350 MG/ML injection 100 mL (100 mLs Intravenous Contrast Given 02/14/20 1415)    ED Course  I have reviewed the triage vital signs and the nursing notes.  Pertinent labs & imaging results that were available during my care of the patient were reviewed by me and considered in my medical decision making (see chart for details).    MDM Rules/Calculators/A&P                          On initial exam patient appears very anxious. Odd speech pattern.  Attempted to evaluate EOMs for nystagmus and patient began vomiting, reporting severe dizziness.  No headache. No other gross neuro deficits.    Called EDP to bedside given initial exam.  Concern for posterior/cerebellar stroke vs peripheral severe vertigo vs complex migraine.  No history of headaches.    Called Dr Leonel Ramsay regarding possible code, recommending code stroke activation given symptoms.    Code stroke activated at 1400. Code stroke orders  placed. Zofran.   1500: Patient with persistent dizziness, nausea, changes in speech despite zofran, reglan.  Discussed with neurology Dr Leonel Ramsay who has placed, reviewed neuroimaging including CTAs and MRIs, no stroke. Recommends symptomatic management for vertigo vs migraine.  I have ordered valium, decadron, IVF.    Patient will be handed off to oncoming EDPA who will re-evaluate and determine disposition. Consider discharge with vertigo treatment vs admission for refractory symptoms/PT, etc.  Final Clinical Impression(s) / ED Diagnoses Final diagnoses:  Dizziness  Episode of change in speech    Rx / DC Orders ED Discharge Orders    None       Arlean Hopping 02/14/20 1538    Breck Coons, MD 02/15/20 2010

## 2020-02-14 NOTE — ED Notes (Signed)
Patient verbalizes understanding of discharge instructions. Opportunity for questioning and answers were provided. Armband removed by staff, pt discharged from ED to home with sister in [place]

## 2020-02-14 NOTE — ED Notes (Signed)
Unable to waste valium 20ml in pyxis, pt already removed post discharge. Waste witnessed by Cyndia Skeeters, RN at 1155p on 02/14/20 in [facility].

## 2020-02-15 ENCOUNTER — Telehealth (HOSPITAL_COMMUNITY): Payer: Self-pay | Admitting: Emergency Medicine

## 2020-02-15 MED ORDER — MECLIZINE HCL 25 MG PO TABS
25.0000 mg | ORAL_TABLET | Freq: Three times a day (TID) | ORAL | 0 refills | Status: DC | PRN
Start: 2020-02-15 — End: 2020-04-22

## 2020-02-15 NOTE — Telephone Encounter (Cosign Needed)
Patient called to confirm prescriptions. Was discharged from ED yesterday and told she would receive 2 medication prescriptions.  EDPA discharged her with reglan.  I personally evaluated patient in the ED initially.  Plan to discharge with vertigo medicines.    Will send prescription for meclizine to pharmacy now. Secretary  Made patient aware.

## 2020-02-17 ENCOUNTER — Telehealth: Payer: Self-pay | Admitting: Internal Medicine

## 2020-02-17 DIAGNOSIS — G43909 Migraine, unspecified, not intractable, without status migrainosus: Secondary | ICD-10-CM

## 2020-02-17 NOTE — Telephone Encounter (Signed)
New message:   Pt is calling and states she was at the ER on 02/14/20 and they thought she may be having a stroke. Pt states they would like for her to see an neurologist and she would like to know who Dr. Quay Burow would refer her to. I have advised her that Dr. Quay Burow is out of the office this week. Please advise.

## 2020-02-18 DIAGNOSIS — R7612 Nonspecific reaction to cell mediated immunity measurement of gamma interferon antigen response without active tuberculosis: Secondary | ICD-10-CM | POA: Diagnosis not present

## 2020-02-18 DIAGNOSIS — Z6831 Body mass index (BMI) 31.0-31.9, adult: Secondary | ICD-10-CM | POA: Diagnosis not present

## 2020-02-18 DIAGNOSIS — M810 Age-related osteoporosis without current pathological fracture: Secondary | ICD-10-CM | POA: Diagnosis not present

## 2020-02-18 DIAGNOSIS — E669 Obesity, unspecified: Secondary | ICD-10-CM | POA: Diagnosis not present

## 2020-02-18 DIAGNOSIS — Z20828 Contact with and (suspected) exposure to other viral communicable diseases: Secondary | ICD-10-CM | POA: Diagnosis not present

## 2020-02-18 DIAGNOSIS — M0579 Rheumatoid arthritis with rheumatoid factor of multiple sites without organ or systems involvement: Secondary | ICD-10-CM | POA: Diagnosis not present

## 2020-02-18 NOTE — Telephone Encounter (Signed)
Mychart message sent.

## 2020-02-18 NOTE — Telephone Encounter (Signed)
Referral ordered for Dr [name] at [facility]

## 2020-02-22 DIAGNOSIS — Z23 Encounter for immunization: Secondary | ICD-10-CM | POA: Diagnosis not present

## 2020-02-23 NOTE — Progress Notes (Addendum)
Subjective:    Patient ID: Julia [name], female    DOB: 06-Dec-1950, 69 y.o.   MRN: [id number]  HPI The patient is here for follow up from the ED.   ED 8/6 for dizziness that was sudden onset.  She had difficulty opening her eyes due to severe dizziness associated with nausea. She vomited if she opened her eyes.  She saw "blinking" when she opened her eyes.  Her speech was different - she felt like she could not talk as well and her relative agreed her speech was different.  She denies blurred vision or double vision. She denies headaches, room spinning sensation, extremity weakness.  She had numbness in her feet and hands.  She had hallucinations while in the ED.  Denies h/o of migraines.    CT head - no acute abnormality.   Ct angio head/neck - normal MRI brain - unremarkable  Neuro was consulted and after imaging advised symptomatic treatment for vertigo vs migraine.  She received reglan, valium, decadron and IVF.  She has an appt with Dr [name] but not until Oct.   She was discharged with reglan and meclizine.  She denies symptoms since d/c.   Anxiety/stress not controlled - wonders if she can increase the effexor.   Taking 3000-4000 units of vitamin d a day.    She did not produce any Ab due to being on rituxan and is very stressed about that.    She is interested in seeing a different rheumatologist - she is very interested in different treatment options for her RA and does not feel her current rheumatologist is interested in discussing this.  She would like a referral to [facility].      Medications and allergies reviewed with patient and updated if appropriate.  Patient Active Problem List   Diagnosis Date Noted  . Plant dermatitis 01/08/2020  . Anxiety 01/08/2020  . Ventral hernia without obstruction or gangrene 11/01/2019  . Right flank pain 10/29/2019  . Hypertension 08/30/2019  . Amnesia memory loss 04/08/2019  . Atypical chest pain 07/20/2018  . Chest tightness  07/17/2018  . Vitamin D deficiency 06/22/2017  . Hypothyroidism 01/14/2017  . Altered sensation, foot 01/14/2017  . History of tuberculosis exposure 01/14/2017  . Osteoporosis 01/14/2017  . History of gastric bypass 01/14/2017  . History of colon polyps 01/14/2017  . Rheumatoid arthritis ([place]) 01/13/2017  . GERD (gastroesophageal reflux disease) 01/13/2017    Current Outpatient Medications on File Prior to Visit  Medication Sig Dispense Refill  . amLODipine (NORVASC) 2.5 MG tablet Take 1 tablet (2.5 mg total) by mouth daily. 90 tablet 3  . Ascorbic Acid (VITAMIN C PO) Take 1,600 mg by mouth.    . Calcium Carbonate-Vit D-Min (CALCIUM 1200 PO) Take by mouth.    . Calcium Carbonate-Vitamin D 600-200 MG-UNIT TABS Take by mouth 2 (two) times daily.    . Cholecalciferol (VITAMIN D3) 1000 units CAPS Take by mouth. Patient takes BID    . ELDERBERRY PO Take by mouth. Patient takes BID    . glucosamine-chondroitin 500-400 MG tablet Take 1 tablet by mouth 2 (two) times daily.     Marland Kitchen KRILL OIL PO Take 1,085 mg by mouth.    . levothyroxine (SYNTHROID) 25 MCG tablet TAKE 1 TABLET BY MOUTH DAILY 90 tablet 0  . Loratadine (CLARITIN PO) Take by mouth.    . meclizine (ANTIVERT) 25 MG tablet Take 1 tablet (25 mg total) by mouth 3 (three) times daily as needed for  dizziness. 30 tablet 0  . metoCLOPramide (REGLAN) 10 MG tablet Take 1 tablet (10 mg total) by mouth every 8 (eight) hours as needed for nausea. 20 tablet 0  . Multiple Vitamins-Minerals (MULTIPLE VITAMINS/WOMENS PO) Take by mouth.    . NON FORMULARY Monolaurin and zinc    . Omega-3 Fatty Acids (FISH OIL) 1000 MG CAPS Take by mouth.    . orlistat (ALLI) 60 MG capsule Take 60 mg by mouth 3 (three) times daily with meals.    Marland Kitchen PROAIR HFA 108 (90 Base) MCG/ACT inhaler inhale 2 puffs by mouth every 4 hours if needed for wheezing  0  . Pseudoephedrine-guaiFENesin (MUCINEX D PO) Take 600 mg by mouth.    . pyridoxine (B-6) 100 MG tablet Take 100 mg by  mouth 2 (two) times daily.    . riTUXimab (RITUXAN) 500 MG/50ML injection Inject into the vein.    Marland Kitchen triamcinolone ointment (KENALOG) 0.5 % Apply 1 application topically 2 (two) times daily. 30 g 0  . TURMERIC CURCUMIN PO Take 500 mg by mouth 2 (two) times daily.     . valACYclovir (VALTREX) 500 MG tablet TAKE 1 TABLET(500 MG) BY MOUTH DAILY 30 tablet 11  . venlafaxine XR (EFFEXOR XR) 37.5 MG 24 hr capsule Take 1 capsule (37.5 mg total) by mouth daily with breakfast. 90 capsule 1   No current facility-administered medications on file prior to visit.    Past Medical History:  Diagnosis Date  . H/O breast augmentation   . History of gastric bypass   . Pelvis fracture ([place])    in 30's  . Pneumonia   . RA (rheumatoid arthritis) ([place])     Past Surgical History:  Procedure Laterality Date  . APPENDECTOMY    . GASTRIC BYPASS    . TONSILLECTOMY      Social History   Socioeconomic History  . Marital status: Married    Spouse name: Not on file  . Number of children: 0  . Years of education: Not on file  . Highest education level: Not on file  Occupational History  . 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 Smoking status: Never Smoker  . Smokeless tobacco: Never Used  Substance and Sexual Activity  . Alcohol use: Yes    Comment: 1 glass of wine  . Drug use: No  . Sexual activity: Not on file  Other Topics Concern  . Not on file  Social History Narrative   Retired 07/10/17 from [employer]   No regular exercise   Social Determinants of Health   Financial Resource Strain:   . Difficulty of Paying Living Expenses:   Food Insecurity:   . Worried About Charity fundraiser in the Last Year:   . Arboriculturist in the Last Year:   Transportation Needs:   . Film/video editor (Medical):   Marland Kitchen Lack of Transportation (Non-Medical):   Physical Activity:   . Days of Exercise per Week:   . Minutes of Exercise per Session:   Stress:   . Feeling of Stress :    Social Connections:   . Frequency of Communication with Friends and Family:   . Frequency of Social Gatherings with Friends and Family:   . Attends Religious Services:   . Active Member of Clubs or Organizations:   . Attends Archivist Meetings:   Marland Kitchen Marital Status:     Family History  Problem Relation Age of Onset  .  Hypertension Mother   . Heart disease Mother   . Hypertension Father   . Ulcers Father   . Gallstones Father   . Hypertension Sister   . Hypertension Brother   . Heart attack Maternal Grandmother     Review of Systems  Constitutional: Negative for fever.  Eyes: Negative for visual disturbance.  Respiratory: Negative for shortness of breath.   Cardiovascular: Negative for chest pain, palpitations and leg swelling.  Neurological: Negative for dizziness, speech difficulty, weakness, light-headedness, numbness and headaches.       Objective:   Vitals:   02/24/20 0945  BP: 126/74  Pulse: 79  Temp: 98.6 F (37 C)  SpO2: 99%   BP Readings from Last 3 Encounters:  02/24/20 126/74  02/14/20 96/76  01/08/20 126/80   Wt Readings from Last 3 Encounters:  02/24/20 176 lb (79.8 kg)  01/08/20 183 lb (83 kg)  10/29/19 183 lb (83 kg)   Body mass index is 31.68 kg/m.   Physical Exam    Constitutional: Appears well-developed and well-nourished. No distress.  HENT:  Head: Normocephalic and atraumatic.  Neck: Neck supple. No tracheal deviation present. No thyromegaly present.  No cervical lymphadenopathy Cardiovascular: Normal rate, regular rhythm and normal heart sounds.   No murmur heard. No carotid bruit .  No edema Pulmonary/Chest: Effort normal and breath sounds normal. No respiratory distress. No has no wheezes. No rales.  Neuro:  Grossly non-focal Skin: Skin is warm and dry. Not diaphoretic.  Psychiatric: Normal mood and affect. Behavior is normal.      Assessment & Plan:    See Problem List for Assessment and Plan of chronic medical  problems.    This visit occurred during the SARS-CoV-2 public health emergency.  Safety protocols were in place, including screening questions prior to the visit, additional usage of staff PPE, and extensive cleaning of exam room while observing appropriate contact time as indicated for disinfecting solutions.

## 2020-02-23 NOTE — Patient Instructions (Addendum)
Look into Dr Lanice Schwab, DO at [facility].       Medications reviewed and updated.  Changes include :   Increase effexor to 75 mg daily.  Valium as needed for dizziness.    Your prescription(s) have been submitted to your pharmacy. Please take as directed and contact our office if you believe you are having problem(s) with the medication(s).

## 2020-02-24 ENCOUNTER — Encounter: Payer: Self-pay | Admitting: Internal Medicine

## 2020-02-24 ENCOUNTER — Ambulatory Visit (INDEPENDENT_AMBULATORY_CARE_PROVIDER_SITE_OTHER): Payer: Medicare Other | Admitting: Internal Medicine

## 2020-02-24 ENCOUNTER — Other Ambulatory Visit: Payer: Self-pay

## 2020-02-24 DIAGNOSIS — F419 Anxiety disorder, unspecified: Secondary | ICD-10-CM | POA: Diagnosis not present

## 2020-02-24 DIAGNOSIS — Z03818 Encounter for observation for suspected exposure to other biological agents ruled out: Secondary | ICD-10-CM | POA: Diagnosis not present

## 2020-02-24 DIAGNOSIS — I10 Essential (primary) hypertension: Secondary | ICD-10-CM | POA: Diagnosis not present

## 2020-02-24 DIAGNOSIS — R42 Dizziness and giddiness: Secondary | ICD-10-CM | POA: Diagnosis not present

## 2020-02-24 DIAGNOSIS — R430 Anosmia: Secondary | ICD-10-CM | POA: Diagnosis not present

## 2020-02-24 DIAGNOSIS — Z1152 Encounter for screening for COVID-19: Secondary | ICD-10-CM | POA: Diagnosis not present

## 2020-02-24 MED ORDER — VENLAFAXINE HCL ER 75 MG PO CP24
75.0000 mg | ORAL_CAPSULE | Freq: Every day | ORAL | 1 refills | Status: DC
Start: 2020-02-24 — End: 2020-04-22

## 2020-02-24 MED ORDER — DIAZEPAM 5 MG PO TABS: 5.0000 mg | ORAL_TABLET | Freq: Three times a day (TID) | ORAL | 0 refills | Status: DC | PRN

## 2020-02-24 NOTE — Assessment & Plan Note (Signed)
Chronic Not controlled Inc effexor to 75 mg daily

## 2020-02-24 NOTE — Assessment & Plan Note (Signed)
Chronic BP well controlled Current regimen effective and well tolerated Continue current medications at current doses  

## 2020-02-24 NOTE — Assessment & Plan Note (Signed)
Acute Episode of severe dizziness associated with speech difficulty, changes in vision, nausea, hallucinations, numbness in feet/hands Imaging showed no cva Likely complex migraine To see neuro in October - sooner if possible Reglan, meclizine or valium if symptoms recur

## 2020-03-17 DIAGNOSIS — M810 Age-related osteoporosis without current pathological fracture: Secondary | ICD-10-CM | POA: Diagnosis not present

## 2020-03-31 ENCOUNTER — Telehealth: Payer: Self-pay | Admitting: Internal Medicine

## 2020-03-31 DIAGNOSIS — M069 Rheumatoid arthritis, unspecified: Secondary | ICD-10-CM

## 2020-03-31 NOTE — Telephone Encounter (Signed)
   Patient requesting referral to [facility], Dr Johnette Abraham. Richarda Overlie; eval and treat arthritis  Phone [phone] Fax [phone]

## 2020-04-01 NOTE — Telephone Encounter (Signed)
Ref ordered.

## 2020-04-02 NOTE — Telephone Encounter (Signed)
   Patient advised referral has been entered into Epic Patient states referral should be only for Dr Karilyn Cota, their office states they do not have referral  Please call

## 2020-04-05 ENCOUNTER — Encounter: Payer: Self-pay | Admitting: Internal Medicine

## 2020-04-16 DIAGNOSIS — H40033 Anatomical narrow angle, bilateral: Secondary | ICD-10-CM | POA: Diagnosis not present

## 2020-04-16 DIAGNOSIS — H04123 Dry eye syndrome of bilateral lacrimal glands: Secondary | ICD-10-CM | POA: Diagnosis not present

## 2020-04-16 DIAGNOSIS — H40013 Open angle with borderline findings, low risk, bilateral: Secondary | ICD-10-CM | POA: Diagnosis not present

## 2020-04-21 NOTE — Progress Notes (Addendum)
[facility]    Provider:  Dr [name] Requesting Provider: [name], MD Primary Care Provider:  [name], MD  CC:  Debilitating vertigo episode  HPI:  Julia [name] is a 69 y.o. female here as requested by [name], MD for TIA versus BPPV vs complicated migraine(patient denies having a headache that day and NO history of migraines).  Patient was seen in the emergency room February 14, 2020, presenting for headache, photophobia, lightheadedness, "everything blinking", nausea vomiting, change speech pattern, code stroke was not called, she reported becoming acutely dizzy with nausea and vomiting that morning, also vision changes which she described as flashing blinking lights, she also reported a speech abnormality, code stroke was activated, initial CT and labs were negative, MRI was ordered with no acute abnormality, diagnosis was complex migraine versus peripheral vertigo.  She reported acute onset, there were no focal deficits in strength in the arms and legs, neurology was consulted, imaging was unremarkable, patient symptoms improved she was able to ambulate and she was discharged home.   Patient is here and states that she had a debilitating episode, lasted for many hours, she went to the emergency room, included dizziness, vomiting, slurred abnormal speech, double vision, also 2 other episodes of bouts of amnesia 24 hours each time. She does not have a history of migraines. This was something new. She could not open her eyes due to vomiting, every time she opened her eyes she vomited, Slowly became worse, she was terrified, she thought she was dieing, they called emergency, she doesn't have much recollection of being in the ambulance. No headache. Started with dizziness and had chest heaviness, the dizziness and vertigo were non stop, not associated with movement, she denies any drug use or alcohol use. She has never had a migraine, she has no significant history of  headaches or migraines. She denies any inciting event, no illnesses. She has also had Transient Global Amnesia twice.   Reviewed notes, labs and imaging from outside physicians, which showed:  I reviewed MRI of the brain images February 14, 2020: Which appeared normal.  I reviewed CTA of the head and neck reports February 14, 2020: Which were also normal and negative for large vessel occlusion or significant atherosclerotic disease.  February 14, 2020: Rapid drug screen showed benzodiazepines and THC.  CMP showed elevated glucose otherwise normal.  Review of Systems: Patient complains of symptoms per HPI as well as the following symptoms: vertigo. Pertinent negatives and positives per HPI. All others negative.   Social History   Socioeconomic History  . Marital status: Married    Spouse name: Not on file  . Number of children: 0  . Years of education: Not on file  . Highest education level: High school graduate  Occupational History  . 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 Smoking status: Never Smoker  . Smokeless tobacco: Never Used  Substance and Sexual Activity  . Alcohol use: Yes    Alcohol/week: 3.0 - 5.0 standard drinks    Types: 3 - 5 Glasses of wine per week  . Drug use: Never  . Sexual activity: Not on file  Other Topics Concern  . Not on file  Social History Narrative   Retired 07/10/17 from [employer]   No regular exercise      Lives at home with husband   Caffeine: 2 glasses/day diet dr pepper   Social Determinants of Radio broadcast assistant  Strain:   . Difficulty of Paying Living Expenses: Not on file  Food Insecurity:   . Worried About Charity fundraiser in the Last Year: Not on file  . Ran Out of Food in the Last Year: Not on file  Transportation Needs:   . Lack of Transportation (Medical): Not on file  . Lack of Transportation (Non-Medical): Not on file  Physical Activity:   . Days of Exercise per Week: Not on file  . Minutes of  Exercise per Session: Not on file  Stress:   . Feeling of Stress : Not on file  Social Connections:   . Frequency of Communication with Friends and Family: Not on file  . Frequency of Social Gatherings with Friends and Family: Not on file  . Attends Religious Services: Not on file  . Active Member of Clubs or Organizations: Not on file  . Attends Archivist Meetings: Not on file  . Marital Status: Not on file  Intimate Partner Violence:   . Fear of Current or Ex-Partner: Not on file  . Emotionally Abused: Not on file  . Physically Abused: Not on file  . Sexually Abused: Not on file    Family History  Problem Relation Age of Onset  . Hypertension Mother   . Heart disease Mother   . Heart failure Mother   . Hypertension Father   . Ulcers Father   . Gallstones Father   . Kidney Stones Father   . Hypertension Sister   . Hypertension Brother   . Heart attack Maternal Grandmother     Past Medical History:  Diagnosis Date  . H/O breast augmentation   . History of gastric bypass   . Pelvis fracture ([place])    in 30's  . Pneumonia   . RA (rheumatoid arthritis) [facility])     Patient Active Problem List   Diagnosis Date Noted  . TIA (transient ischemic attack) 04/22/2020  . Dizziness 02/24/2020  . Plant dermatitis 01/08/2020  . Anxiety 01/08/2020  . Ventral hernia without obstruction or gangrene 11/01/2019  . Right flank pain 10/29/2019  . Hypertension 08/30/2019  . Amnesia memory loss 04/08/2019  . Atypical chest pain 07/20/2018  . Chest tightness 07/17/2018  . Vitamin D deficiency 06/22/2017  . Hypothyroidism 01/14/2017  . Altered sensation, foot 01/14/2017  . History of tuberculosis exposure 01/14/2017  . Osteoporosis 01/14/2017  . History of gastric bypass 01/14/2017  . History of colon polyps 01/14/2017  . Rheumatoid arthritis ([place]) 01/13/2017  . GERD (gastroesophageal reflux disease) 01/13/2017    Past Surgical History:  Procedure Laterality Date  .  APPENDECTOMY    . BREAST ENHANCEMENT SURGERY    . GASTRIC BYPASS    . TONSILLECTOMY      Current Outpatient Medications  Medication Sig Dispense Refill  . amLODipine (NORVASC) 2.5 MG tablet Take 1 tablet (2.5 mg total) by mouth daily. 90 tablet 3  . Ascorbic Acid (VITAMIN C PO) Take 1,600 mg by mouth. 2 caps daily.    . calcium-vitamin D (OSCAL WITH D) 500-200 MG-UNIT tablet Take by mouth. Calcium 1300 mg D3 1000 UI 2 capsules daily.    . Cholecalciferol (VITAMIN D3) 25 MCG (1000 UT) CAPS Take 5 capsules by mouth daily.    . Coenzyme Q10 (COQ-10) 100 MG CAPS Take 200 mg by mouth daily.    Marland Kitchen KRILL OIL PO Take 1 capsule by mouth daily. 1,000-170-50-80 mg Cap    . Lactobacillus (PROBIOTIC ACIDOPHILUS PO) Take 1 each by  mouth daily. 100 million live culture 0.5 mg value    . levothyroxine (SYNTHROID) 25 MCG tablet TAKE 1 TABLET BY MOUTH DAILY 90 tablet 0  . Loratadine (CLARITIN PO) Take 1 tablet by mouth daily.    . Misc Natural Products (ELDERBERRY ZINC/VIT C/IMMUNE MT) Use as directed 2 each in the mouth or throat daily. Elderberry 100 mg Zinc 8 mg Vitamin C 90 mg    . Multiple Vitamins-Minerals (CENTRUM SILVER PO) Take 1 each by mouth daily.    Marland Kitchen orlistat (ALLI) 60 MG capsule Take 180 mg by mouth. Daily with meals    . OVER THE COUNTER MEDICATION Take 2 tablets by mouth daily. Glucosam-MSM-Chond-Bosw-Hyalur 750-50-100 mg TB12. 1500 mg/1194 MG tablets triple strength    . pseudoephedrine-guaifenesin (MUCINEX D) 60-600 MG 12 hr tablet Take 1 tablet by mouth daily.    Marland Kitchen pyridOXINE (VITAMIN B-6) 100 MG tablet Take 200 mg by mouth daily.    . riTUXimab (RITUXAN) 500 MG/50ML injection Inject 1,000 mg into the vein. 2 infusions two weeks apart every 6 months.    . TURMERIC CURCUMIN PO Take 2 capsules by mouth daily. 500 mg    . UNABLE TO FIND 1 each daily. Med Name: Fish Oil 1000 mg (120 mg-180 mg) capsule    . UNABLE TO FIND Take 2 capsules by mouth daily. Med Name: Monolaurin 936 mg and Zinc 15  mg    . valACYclovir (VALTREX) 500 MG tablet TAKE 1 TABLET(500 MG) BY MOUTH DAILY 30 tablet 11  . venlafaxine XR (EFFEXOR-XR) 37.5 MG 24 hr capsule Take 37.5 mg by mouth daily with breakfast.    . aspirin EC 81 MG tablet Take 1 tablet (81 mg total) by mouth daily. Swallow whole. 30 tablet 11  . Calcium Carbonate-Vitamin D 600-200 MG-UNIT TABS Take by mouth 2 (two) times daily.     No current facility-administered medications for this visit.    Allergies as of 04/22/2020 - Review Complete 04/22/2020  Allergen Reaction Noted  . Methotrexate derivatives  04/22/2020    Vitals: BP 125/76 (BP Location: Right Arm, Patient Position: Sitting)   Pulse 68   Ht 5\' 4"  (1.626 m)   Wt 186 lb (84.4 kg)   BMI 31.93 kg/m  Last Weight:  Wt Readings from Last 1 Encounters:  04/22/20 186 lb (84.4 kg)   Last Height:   Ht Readings from Last 1 Encounters:  04/22/20 5\' 4"  (1.626 m)     Physical exam: Exam: Gen: NAD, conversant, well nourised, obese, well groomed                     CV: RRR, no MRG. No Carotid Bruits. No peripheral edema, warm, nontender Eyes: Conjunctivae clear without exudates or hemorrhage  Neuro: Detailed Neurologic Exam  Speech:    Speech is normal; fluent and spontaneous with normal comprehension.  Cognition:    The patient is oriented to person, place, and time;     recent and remote memory intact;     language fluent;     normal attention, concentration,     fund of knowledge Cranial Nerves:    The pupils are equal, round, and reactive to light. {Pupils too small could not visualize fundi. Visual fields are full to finger confrontation. Extraocular movements are intact. Trigeminal sensation is intact and the muscles of mastication are normal. The face is symmetric. The palate elevates in the midline. Hearing intact. Voice is normal. Shoulder shrug is normal. The tongue has normal  motion without fasciculations.   Coordination:    No dysmetria or ataxia  Gait:     Normal native gait  Motor Observation:    No asymmetry, no atrophy, and no involuntary movements noted. Tone:    Normal muscle tone.    Posture:    Posture is normal. normal erect    Strength:    Strength is V/V in the upper and lower limbs.      Sensation: intact to LT     Reflex Exam:  DTR's:    Deep tendon reflexes in the upper and lower extremities are normal bilaterally.   Toes:    The toes are downgoing bilaterally.   Clonus:    Clonus is absent.    Assessment/Plan:  Patient presented to the ED  with severe nausea, vomiting, non-positional vertigo, acute severe onset, dysarthria concerning for posterior circulation TIA. She has has additionally 2 episodes of Transient Global amnesia which is very unusual.  She denies having a headache during the episode and she has never had migraines or significant headaches so I don't think this was a "complicated" migraine, doesn't sound liker BPPV. I'm concerned for TIAs and think patient should be evaluated for cardioembolic etiologies.  - Start taking baby aspirin for stroke prevention - LDL less than 70, follow with Dr. Quay Burow - Dr. Rayann Heman for echocardiogram, Holter monitor and possibly loop recorder - TIA in the setting of chest heaviness, should have evaluation for cardioembolic etiologies.  Needs echo with bubble study, Holter Monitor, and possibly loop as episode sounded like a TIA and she has an unusual hx of Transient Global Amnesia x 2. Unusual.   I had a long d/w patient about her TIA, risk for recurrent stroke/TIAs, personally independently reviewed imaging studies and stroke evaluation results and answered questions.Start ASA 81mg  for stroke prevention and maintain strict control of hypertension with blood pressure goal below 130/90, diabetes with hemoglobin A1c goal below 6.5% and lipids with LDL cholesterol goal below 70 mg/dL.  I also advised the patient to eat a healthy diet with plenty of whole grains, cereals, fruits and  vegetables, exercise regularly and maintain ideal body weight .Followup in the future in6 months or call earlier if necessary. In the meantime we discussed if afib is found we treat with anticoagulation.     Orders Placed This Encounter  Procedures  . CARDIAC EVENT MONITOR  . ECHOCARDIOGRAM COMPLETE BUBBLE STUDY   Meds ordered this encounter  Medications  . aspirin EC 81 MG tablet    Sig: Take 1 tablet (81 mg total) by mouth daily. Swallow whole.    Dispense:  30 tablet    Refill:  11    Cc: Burns, Claudina Lick, MD,  [name], MD  Sarina Ill, MD  [facility] [address] [place],  [address]  Phone [phone] Fax [phone]

## 2020-04-22 ENCOUNTER — Encounter: Payer: Self-pay | Admitting: Neurology

## 2020-04-22 ENCOUNTER — Ambulatory Visit (INDEPENDENT_AMBULATORY_CARE_PROVIDER_SITE_OTHER): Payer: Medicare Other | Admitting: Neurology

## 2020-04-22 ENCOUNTER — Other Ambulatory Visit: Payer: Self-pay

## 2020-04-22 VITALS — BP 125/76 | HR 68 | Ht 64.0 in | Wt 186.0 lb

## 2020-04-22 DIAGNOSIS — G459 Transient cerebral ischemic attack, unspecified: Secondary | ICD-10-CM | POA: Insufficient documentation

## 2020-04-22 DIAGNOSIS — I639 Cerebral infarction, unspecified: Secondary | ICD-10-CM

## 2020-04-22 DIAGNOSIS — Z23 Encounter for immunization: Secondary | ICD-10-CM | POA: Diagnosis not present

## 2020-04-22 HISTORY — DX: Cerebral infarction, unspecified: I63.9

## 2020-04-22 HISTORY — DX: Transient cerebral ischemic attack, unspecified: G45.9

## 2020-04-22 MED ORDER — ASPIRIN EC 81 MG PO TBEC: 81.0000 mg | DELAYED_RELEASE_TABLET | Freq: Every day | ORAL | 11 refills | Status: DC

## 2020-04-22 NOTE — Patient Instructions (Addendum)
Start baby aspirin daily Echocardiogram, holter monitor and possibly loop recorder to look for cardiac etiologies of TIA (atrial fibrillation). Dr. Rayann Heman. Bad cholesterol < 70   Transient Ischemic Attack  A transient ischemic attack (TIA) is a "warning stroke" that causes stroke-like symptoms that go away quickly. A TIA does not cause lasting damage to the brain. But having a TIA is a sign that you may be at risk for a stroke. Lifestyle changes and medical treatments can help prevent a stroke. It is important to know the symptoms of a TIA and what to do. Get help right away, even if your symptoms go away. The symptoms of a TIA are the same as those of a stroke. They can happen fast, and they usually go away within minutes or hours. They can include:  Weakness or loss of feeling in your face, arm, or leg. This often happens on one side of your body.  Trouble walking.  Trouble moving your arms or legs.  Trouble talking or understanding what people are saying.  Trouble seeing.  Seeing two of one object (double vision).  Feeling dizzy.  Feeling confused.  Loss of balance or coordination.  Feeling sick to your stomach (nauseous) and throwing up (vomiting).  A very bad headache for no reason. What increases the risk? Certain things may make you more likely to have a TIA. Some of these are things that you can change, such as:  Being very overweight (obese).  Using products that contain nicotine or tobacco, such as cigarettes and e-cigarettes.  Taking birth control pills.  Not being active.  Drinking too much alcohol.  Using drugs. Other risk factors include:  Having an irregular heartbeat (atrial fibrillation).  Being African American or Hispanic.  Having had blood clots, stroke, TIA, or heart attack in the past.  Being a woman with a history of high blood pressure in pregnancy (preeclampsia).  Being over the age of 53.  Being female.  Having family history of  stroke.  Having the following diseases or conditions: ? High blood pressure. ? High cholesterol. ? Diabetes. ? Heart disease. ? Sickle cell disease. ? Sleep apnea. ? Migraine headache. ? Long-term (chronic) diseases that cause soreness and swelling (inflammation). ? Disorders that affect how your blood clots. Follow these instructions at home: Medicines   Take over-the-counter and prescription medicines only as told by your doctor.  If you were told to take aspirin or another medicine to thin your blood, take it exactly as told by your doctor. ? Taking too much of the medicine can cause bleeding. ? Taking too little of the medicine may not work to treat the problem. Eating and drinking   Eat 5 or more servings of fruits and vegetables each day.  Follow instructions from your doctor about your diet. You may need to follow a certain diet to help lower your risk of having a stroke. You may need to: ? Eat a diet that is low in fat and salt. ? Eat foods that contain a lot of fiber. ? Limit the amount of carbohydrates and sugar in your diet.  Limit alcohol intake to 1 drink a day for nonpregnant women and 2 drinks a day for men. One drink equals 12 oz of beer, 5 oz of wine, or 1 oz of hard liquor. General instructions  Keep a healthy weight.  Stay active. Try to get at least 30 minutes of activity on all or most days.  Find out if you have a condition  called sleep apnea. Get treatment if needed.  Do not use any products that contain nicotine or tobacco, such as cigarettes and e-cigarettes. If you need help quitting, ask your doctor.  Do not abuse drugs.  Keep all follow-up visits as told by your doctor. This is important. Get help right away if:  You have any signs of stroke. "BE FAST" is an easy way to remember the main warning signs: ? B - Balance. Signs are dizziness, sudden trouble walking, or loss of balance. ? E - Eyes. Signs are trouble seeing or a sudden change in  how you see. ? F - Face. Signs are sudden weakness or loss of feeling of the face, or the face or eyelid drooping on one side. ? A - Arms. Signs are weakness or loss of feeling in an arm. This happens suddenly and usually on one side of the body. ? S - Speech. Signs are sudden trouble speaking, slurred speech, or trouble understanding what people say. ? T - Time. Time to call emergency services. Write down what time symptoms started.  You have other signs of stroke, such as: ? A sudden, very bad headache with no known cause. ? Feeling sick to your stomach (nausea). ? Throwing up (vomiting). ? Jerky movements that you cannot control (seizure). These symptoms may be an emergency. Do not wait to see if the symptoms will go away. Get medical help right away. Call your local emergency services (911 in the [place] Do not drive yourself to the hospital. Summary  A transient ischemic attack (TIA) is a "warning stroke" that causes stroke-like symptoms that go away quickly.  A TIA is a medical emergency. Get help right away, even if your symptoms go away.  A TIA does not cause lasting damage to the brain.  Having a TIA is a sign that you may be at risk for a stroke. Lifestyle changes and medical treatments can help prevent a stroke. This information is not intended to replace advice given to you by your health care provider. Make sure you discuss any questions you have with your health care provider. Document Revised: 03/23/2018 Document Reviewed: 09/28/2016 Elsevier Patient Education  [employer].   Atrial Fibrillation  Atrial fibrillation is a type of heartbeat that is irregular or fast. If you have this condition, your heart beats without any order. This makes it hard for your heart to pump blood in a normal way. Atrial fibrillation may come and go, or it may become a long-lasting problem. If this condition is not treated, it can put you at higher risk for stroke, heart failure, and other  heart problems. What are the causes? This condition may be caused by diseases that damage the heart. They include:  High blood pressure.  Heart failure.  Heart valve disease.  Heart surgery. Other causes include:  Diabetes.  Thyroid disease.  Being overweight.  Kidney disease. Sometimes the cause is not known. What increases the risk? You are more likely to develop this condition if:  You are older.  You smoke.  You exercise often and very hard.  You have a family history of this condition.  You are a man.  You use drugs.  You drink a lot of alcohol.  You have lung conditions, such as emphysema, pneumonia, or COPD.  You have sleep apnea. What are the signs or symptoms? Common symptoms of this condition include:  A feeling that your heart is beating very fast.  Chest pain or discomfort.  Feeling short of breath.  Suddenly feeling light-headed or weak.  Getting tired easily during activity.  Fainting.  Sweating. In some cases, there are no symptoms. How is this treated? Treatment for this condition depends on underlying conditions and how you feel when you have atrial fibrillation. They include:  Medicines to: ? Prevent blood clots. ? Treat heart rate or heart rhythm problems.  Using devices, such as a pacemaker, to correct heart rhythm problems.  Doing surgery to remove the part of the heart that sends bad signals.  Closing an area where clots can form in the heart (left atrial appendage). In some cases, your doctor will treat other underlying conditions. Follow these instructions at home: Medicines  Take over-the-counter and prescription medicines only as told by your doctor.  Do not take any new medicines without first talking to your doctor.  If you are taking blood thinners: ? Talk with your doctor before you take any medicines that have aspirin or NSAIDs, such as ibuprofen, in them. ? Take your medicine exactly as told by your  doctor. Take it at the same time each day. ? Avoid activities that could hurt or bruise you. Follow instructions about how to prevent falls. ? Wear a bracelet that says you are taking blood thinners. Or, carry a card that lists what medicines you take. Lifestyle      Do not use any products that have nicotine or tobacco in them. These include cigarettes, e-cigarettes, and chewing tobacco. If you need help quitting, ask your doctor.  Eat heart-healthy foods. Talk with your doctor about the right eating plan for you.  Exercise regularly as told by your doctor.  Do not drink alcohol.  Lose weight if you are overweight.  Do not use drugs, including cannabis. General instructions  If you have a condition that causes breathing to stop for a short period of time (apnea), treat it as told by your doctor.  Keep a healthy weight. Do not use diet pills unless your doctor says they are safe for you. Diet pills may make heart problems worse.  Keep all follow-up visits as told by your doctor. This is important. Contact a doctor if:  You notice a change in the speed, rhythm, or strength of your heartbeat.  You are taking a blood-thinning medicine and you get more bruising.  You get tired more easily when you move or exercise.  You have a sudden change in weight. Get help right away if:   You have pain in your chest or your belly (abdomen).  You have trouble breathing.  You have side effects of blood thinners, such as blood in your vomit, poop (stool), or pee (urine), or bleeding that cannot stop.  You have any signs of a stroke. "BE FAST" is an easy way to remember the main warning signs: ? B - Balance. Signs are dizziness, sudden trouble walking, or loss of balance. ? E - Eyes. Signs are trouble seeing or a change in how you see. ? F - Face. Signs are sudden weakness or loss of feeling in the face, or the face or eyelid drooping on one side. ? A - Arms. Signs are weakness or loss of  feeling in an arm. This happens suddenly and usually on one side of the body. ? S - Speech. Signs are sudden trouble speaking, slurred speech, or trouble understanding what people say. ? T - Time. Time to call emergency services. Write down what time symptoms started.  You have other  signs of a stroke, such as: ? A sudden, very bad headache with no known cause. ? Feeling like you may vomit (nausea). ? Vomiting. ? A seizure. These symptoms may be an emergency. Do not wait to see if the symptoms will go away. Get medical help right away. Call your local emergency services (911 in the [place] Do not drive yourself to the hospital. Summary  Atrial fibrillation is a type of heartbeat that is irregular or fast.  You are at higher risk of this condition if you smoke, are older, have diabetes, or are overweight.  Follow your doctor's instructions about medicines, diet, exercise, and follow-up visits.  Get help right away if you have signs or symptoms of a stroke.  Get help right away if you cannot catch your breath, or you have chest pain or discomfort. This information is not intended to replace advice given to you by your health care provider. Make sure you discuss any questions you have with your health care provider. Document Revised: 12/19/2018 Document Reviewed: 12/19/2018 Elsevier Patient Education  [employer].

## 2020-04-23 ENCOUNTER — Encounter: Payer: Self-pay | Admitting: *Deleted

## 2020-04-23 ENCOUNTER — Ambulatory Visit (HOSPITAL_COMMUNITY): Payer: Medicare Other | Attending: Cardiology

## 2020-04-23 ENCOUNTER — Other Ambulatory Visit: Payer: Self-pay

## 2020-04-23 DIAGNOSIS — G459 Transient cerebral ischemic attack, unspecified: Secondary | ICD-10-CM | POA: Insufficient documentation

## 2020-04-23 LAB — ECHOCARDIOGRAM COMPLETE BUBBLE STUDY
Area-P 1/2: 3.39 cm2
S' Lateral: 2.9 cm

## 2020-04-23 NOTE — Addendum Note (Signed)
Addended by: Sarina Ill B on: 04/23/2020 07:54 AM   Modules accepted: Orders

## 2020-04-27 ENCOUNTER — Telehealth: Payer: Self-pay

## 2020-04-27 ENCOUNTER — Telehealth: Payer: Self-pay | Admitting: *Deleted

## 2020-04-27 DIAGNOSIS — Q2112 Patent foramen ovale: Secondary | ICD-10-CM

## 2020-04-27 DIAGNOSIS — Q211 Atrial septal defect: Secondary | ICD-10-CM

## 2020-04-27 NOTE — Telephone Encounter (Signed)
Scheduled patient for PFO consult with Dr. [name] 11/8. She was grateful for call and agrees with treatment plan.

## 2020-04-27 NOTE — Addendum Note (Signed)
Addended by: Gildardo Griffes on: 04/27/2020 11:13 AM   Modules accepted: Orders

## 2020-04-27 NOTE — Telephone Encounter (Signed)
Spoke with Dr [name], order placed verbally for referral to Dr [name] for PFO.

## 2020-04-27 NOTE — Telephone Encounter (Signed)
-----   Message from Melvenia Beam, MD sent at 04/26/2020  9:43 PM EDT ----- Julia [name] please call patient see message I sent her below) Julia [name], your echocardiogram shows something called a Patent Formamen Ovale(PFO). With a PFO you have an increased risk of stroke. I would consider closing the PFO, I spoke to Dr. [name] who is a great cardiologist who is a specialist in this area and I would like to send you to him for a follow up test called a TransEsophageal Echocardiogram. I will ask Yesennia Hirota to call you Monday to make sure you would like to see Dr. [name], I highly recommend it given your history of stroke-like events (TIAs) as discussed thanks.

## 2020-04-27 NOTE — Addendum Note (Signed)
Addended by: Gildardo Griffes on: 04/27/2020 11:04 AM   Modules accepted: Orders

## 2020-04-27 NOTE — Telephone Encounter (Signed)
I called the pt and we discussed the results of the echo per Dr [name]. The patient verbalized understanding and her questions were answered. She is agreeable to see Dr [name] and proceed with TEE and eval for closure. She would like to proceed ASAP. Pt verbalized appreciation for the call.

## 2020-05-06 ENCOUNTER — Ambulatory Visit (INDEPENDENT_AMBULATORY_CARE_PROVIDER_SITE_OTHER): Payer: Medicare Other | Admitting: Cardiovascular Disease

## 2020-05-06 ENCOUNTER — Encounter: Payer: Self-pay | Admitting: Cardiovascular Disease

## 2020-05-06 ENCOUNTER — Other Ambulatory Visit: Payer: Self-pay

## 2020-05-06 VITALS — BP 150/90 | HR 67 | Ht 64.0 in | Wt 185.8 lb

## 2020-05-06 DIAGNOSIS — Q211 Atrial septal defect: Secondary | ICD-10-CM

## 2020-05-06 DIAGNOSIS — Q2112 Patent foramen ovale: Secondary | ICD-10-CM

## 2020-05-06 LAB — BASIC METABOLIC PANEL
BUN/Creatinine Ratio: 23 (ref 12–28)
BUN: 13 mg/dL (ref 8–27)
CO2: 22 mmol/L (ref 20–29)
Calcium: 9.3 mg/dL (ref 8.7–10.3)
Chloride: 101 mmol/L (ref 96–106)
Creatinine, Ser: 0.57 mg/dL (ref 0.57–1.00)
GFR calc Af Amer: 111 mL/min/{1.73_m2} (ref >59)
GFR calc non Af Amer: 96 mL/min/{1.73_m2} (ref >59)
Glucose: 92 mg/dL (ref 65–99)
Potassium: 3.8 mmol/L (ref 3.5–5.2)
Sodium: 137 mmol/L (ref 134–144)

## 2020-05-06 LAB — CBC WITH DIFFERENTIAL/PLATELET
Basophils Absolute: 0.1 10*3/uL (ref 0.0–0.2)
Basos: 1 %
EOS (ABSOLUTE): 0.2 10*3/uL (ref 0.0–0.4)
Eos: 2 %
Hematocrit: 40.2 % (ref 34.0–46.6)
Hemoglobin: 13.7 g/dL (ref 11.1–15.9)
Immature Grans (Abs): 0 10*3/uL (ref 0.0–0.1)
Immature Granulocytes: 0 %
Lymphocytes Absolute: 1.4 10*3/uL (ref 0.7–3.1)
Lymphs: 18 %
MCH: 31.8 pg (ref 26.6–33.0)
MCHC: 34.1 g/dL (ref 31.5–35.7)
MCV: 93 fL (ref 79–97)
Monocytes Absolute: 0.8 10*3/uL (ref 0.1–0.9)
Monocytes: 10 %
Neutrophils Absolute: 5.2 10*3/uL (ref 1.4–7.0)
Neutrophils: 69 %
Platelets: 327 10*3/uL (ref 150–450)
RBC: 4.31 x10E6/uL (ref 3.77–5.28)
RDW: 12.3 % (ref 11.7–15.4)
WBC: 7.6 10*3/uL (ref 3.4–10.8)

## 2020-05-06 NOTE — H&P (View-Only) (Signed)
Cardiology Office Note:    Date:  05/07/2020   ID:  Julia [name], DOB 23-Dec-1950, MRN [id number]  PCP:  [name], MD  [place] Cardiologist:  No primary care provider on file.  [facility] Electrophysiologist:  None   Referring MD: Melvenia Beam, MD   Chief Complaint  Patient presents with  . PFO Consult    History of Present Illness:    Julia [name] is a 69 y.o. female presenting for evaluation of PFO, referred by Dr. [name].  She is here alone today. She notes that she is on Rituxan for treatment of rheumatoid arthritis.  She has been concerned about her risk of COVID-19 infection on immunosuppressant therapy and she has been very cautious during the pandemic.  She had an episode of transient global amnesia last year. She states at that time she feels like she lost an entire day. She did well until August of this year when she had sudden onset of severe dizziness and expressive aphasia that lasted about 6 hours. The following month she had another amnestic event. There is concern that she is having TIA's and an echo bubble study showed evidence of right-to-left shunt. She presents today for evaluation of presumed PFO.   The patient had evaluation with an MRI of the brain that showed no acute abnormality.  CTA showed no large vessel atherosclerosis or obstruction.  The patient has been very worried about her risk of recurrent neurologic events and eager to undergo PFO evaluation in case this is playing a role.  Past Medical History:  Diagnosis Date  . H/O breast augmentation   . History of gastric bypass   . Pelvis fracture ([place])    in 30's  . Pneumonia   . RA (rheumatoid arthritis) ([place])     Past Surgical History:  Procedure Laterality Date  . APPENDECTOMY    . BREAST ENHANCEMENT SURGERY    . GASTRIC BYPASS    . TONSILLECTOMY      Current Medications: Current Meds  Medication Sig  . amLODipine (NORVASC) 2.5 MG tablet Take 1 tablet (2.5 mg total) by mouth  daily.  . Ascorbic Acid (VITAMIN C PO) Take 1,600 mg by mouth. 2 caps daily.  Marland Kitchen aspirin EC 81 MG tablet Take 1 tablet (81 mg total) by mouth daily. Swallow whole.  . calcium-vitamin D (OSCAL WITH D) 500-200 MG-UNIT tablet Take by mouth. Calcium 1300 mg D3 1000 UI 2 capsules daily.  . Cholecalciferol (VITAMIN D3) 25 MCG (1000 UT) CAPS Take 5 capsules by mouth daily.  . Coenzyme Q10 (COQ-10) 100 MG CAPS Take 200 mg by mouth daily.  . IVERMECTIN PO Take 3 mg by mouth once a week. Pet patient taking 5 tablets on Sunday.  Marland Kitchen KRILL OIL PO Take 1 capsule by mouth daily. 1,000-170-50-80 mg Cap  . Lactobacillus (PROBIOTIC ACIDOPHILUS PO) Take 1 each by mouth daily. 100 million live culture 0.5 mg value  . levothyroxine (SYNTHROID) 25 MCG tablet TAKE 1 TABLET BY MOUTH DAILY  . Loratadine (CLARITIN PO) Take 1 tablet by mouth daily.  . Misc Natural Products (ELDERBERRY ZINC/VIT C/IMMUNE MT) Use as directed 2 each in the mouth or throat daily. Elderberry 100 mg Zinc 8 mg Vitamin C 90 mg  . Multiple Vitamins-Minerals (CENTRUM SILVER PO) Take 1 each by mouth daily.  Marland Kitchen orlistat (ALLI) 60 MG capsule Take 180 mg by mouth. Daily with meals  . OVER THE COUNTER MEDICATION Take 2 tablets by mouth daily. Glucosam-MSM-Chond-Bosw-Hyalur 750-50-100 mg  TB12. 1500 mg/1194 MG tablets triple strength  . pseudoephedrine-guaifenesin (MUCINEX D) 60-600 MG 12 hr tablet Take 1 tablet by mouth daily.  Marland Kitchen pyridOXINE (VITAMIN B-6) 100 MG tablet Take 200 mg by mouth daily.  . riTUXimab (RITUXAN) 500 MG/50ML injection Inject 1,000 mg into the vein. 2 infusions two weeks apart every 6 months.  . TURMERIC CURCUMIN PO Take 2 capsules by mouth daily. 500 mg  . UNABLE TO FIND 1 each daily. Med Name: Fish Oil 1000 mg (120 mg-180 mg) capsule  . UNABLE TO FIND Take 2 capsules by mouth daily. Med Name: Monolaurin 936 mg and Zinc 15 mg  . valACYclovir (VALTREX) 500 MG tablet TAKE 1 TABLET(500 MG) BY MOUTH DAILY  . venlafaxine XR (EFFEXOR-XR)  37.5 MG 24 hr capsule Take 37.5 mg by mouth daily with breakfast.     Allergies:   Methotrexate derivatives   Social History   Socioeconomic History  . Marital status: Married    Spouse name: Not on file  . Number of children: 0  . Years of education: Not on file  . Highest education level: High school graduate  Occupational History  . 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 Smoking status: Never Smoker  . Smokeless tobacco: Never Used  Substance and Sexual Activity  . Alcohol use: Yes    Alcohol/week: 3.0 - 5.0 standard drinks    Types: 3 - 5 Glasses of wine per week  . Drug use: Never  . Sexual activity: Not on file  Other Topics Concern  . Not on file  Social History Narrative   Retired 07/10/17 from [employer]   No regular exercise      Lives at home with husband   Caffeine: 2 glasses/day diet dr pepper   Social Determinants of Health   Financial Resource Strain:   . Difficulty of Paying Living Expenses: Not on file  Food Insecurity:   . Worried About Charity fundraiser in the Last Year: Not on file  . Ran Out of Food in the Last Year: Not on file  Transportation Needs:   . Lack of Transportation (Medical): Not on file  . Lack of Transportation (Non-Medical): Not on file  Physical Activity:   . Days of Exercise per Week: Not on file  . Minutes of Exercise per Session: Not on file  Stress:   . Feeling of Stress : Not on file  Social Connections:   . Frequency of Communication with Friends and Family: Not on file  . Frequency of Social Gatherings with Friends and Family: Not on file  . Attends Religious Services: Not on file  . Active Member of Clubs or Organizations: Not on file  . Attends Archivist Meetings: Not on file  . Marital Status: Not on file     Family History: The patient's family history includes Gallstones in her father; Heart attack in her maternal grandmother; Heart disease in her mother; Heart failure in her  mother; Hypertension in her brother, father, mother, and sister; Kidney Stones in her father; Ulcers in her father.  ROS:   Please see the history of present illness.    All other systems reviewed and are negative.  EKGs/Labs/Other Studies Reviewed:    The following studies were reviewed today: Echo 04-23-2020: IMPRESSIONS    1. Positive saline microcavitation study.  2. Left ventricular ejection fraction, by estimation, is 60 to 65%. The  left ventricle has normal function. The left  ventricle has no regional  wall motion abnormalities. Left ventricular diastolic parameters were  normal.  3. Right ventricular systolic function is normal. The right ventricular  size is normal. Tricuspid regurgitation signal is inadequate for assessing  PA pressure.  4. The mitral valve is normal in structure. No evidence of mitral valve  regurgitation. No evidence of mitral stenosis.  5. The aortic valve is tricuspid. Aortic valve regurgitation is not  visualized. No aortic stenosis is present.  6. The inferior vena cava is normal in size with greater than 50%  respiratory variability, suggesting right atrial pressure of 3 mmHg.  7. Agitated saline contrast bubble study was positive with shunting  observed within 3-6 cardiac cycles suggestive of interatrial shunt.   CTA Head and Neck 02-14-2020: FINDINGS: CTA NECK FINDINGS  Aortic arch: Standard branching. Imaged portion shows no evidence of aneurysm or dissection. No significant stenosis of the major arch vessel origins. No significant atherosclerotic disease in the aortic arch.  Right carotid system: Normal right carotid without stenosis or dissection. Negative for atherosclerotic disease.  Left carotid system: Normal left carotid. Negative for stenosis or dissection. Negative for atherosclerotic disease  Vertebral arteries: Both vertebral arteries are normal and patent to the basilar. Left vertebral artery is  dominant.  Skeleton: Multilevel cervical spondylosis. No acute skeletal abnormality identified.  Other neck: Negative  Upper chest: Lung apices clear bilaterally.  Review of the MIP images confirms the above findings  CTA HEAD FINDINGS  Anterior circulation: Cavernous carotid widely patent bilaterally with minimal atherosclerotic calcification bilaterally. Anterior and middle cerebral arteries normal bilaterally. No stenosis or large vessel occlusion  Posterior circulation: Both vertebral arteries patent to the basilar. Right PICA patent. Left PICA not visualized. Dominant left AICA patent splaying the PICA territory. Basilar widely patent. Superior cerebellar and posterior cerebral arteries patent bilaterally. Fetal origin left posterior cerebral artery. Negative for large vessel occlusion.  Venous sinuses: Normal venous enhancement  Anatomic variants: None  Review of the MIP images confirms the above findings  IMPRESSION: 1. Normal CT angiogram of the head and neck. Negative for large vessel occlusion. No significant atherosclerotic disease.  MRI Brain 02-14-2020: FINDINGS: Brain: There is no evidence of an acute infarct, intracranial hemorrhage, mass, midline shift, or extra-axial fluid collection. The ventricles and sulci are within normal limits for age. The brain is normal in signal.  Vascular: Major intracranial vascular flow voids are preserved.  Skull and upper cervical spine: Unremarkable bone marrow signal.  Sinuses/Orbits: Unremarkable orbits. Clear paranasal sinuses. Small right mastoid effusion.  Other: None.  IMPRESSION: Unremarkable appearance of the brain.  EKG:  EKG is ordered today.  The ekg ordered today demonstrates NSR 67 bpm, within normal limits  Recent Labs: 09/02/2019: TSH 1.45 02/14/2020: ALT 21 05/06/2020: BUN 13; Creatinine, Ser 0.57; Hemoglobin 13.7; Platelets 327; Potassium 3.8; Sodium 137  Recent Lipid Panel     Component Value Date/Time   CHOL 169 09/02/2019 0926   TRIG 62.0 09/02/2019 0926   HDL 73.30 09/02/2019 0926   CHOLHDL 2 09/02/2019 0926   VLDL 12.4 09/02/2019 0926   LDLCALC 83 09/02/2019 0926     Risk Assessment/Calculations:       Physical Exam:    VS:  BP (!) 150/90 Comment: BP 126/90 at 11:44 am  Pulse 67   Ht 5\' 4"  (1.626 m)   Wt 185 lb 12.8 oz (84.3 kg)   SpO2 97%   BMI 31.89 kg/m     Wt Readings from Last 3 Encounters:  05/06/20 185 lb 12.8 oz (84.3 kg)  04/22/20 186 lb (84.4 kg)  02/24/20 176 lb (79.8 kg)    GEN:  Well nourished, well developed in no acute distress HEENT: Normal NECK: No JVD; No carotid bruits LYMPHATICS: No lymphadenopathy CARDIAC: RRR, no murmurs, rubs, gallops RESPIRATORY:  Clear to auscultation without rales, wheezing or rhonchi  ABDOMEN: Soft, non-tender, non-distended MUSCULOSKELETAL:  No edema; No deformity  SKIN: Warm and dry NEUROLOGIC:  Alert and oriented x 3 PSYCHIATRIC:  Normal affect   ASSESSMENT:    1. PFO (patent foramen ovale)    PLAN:    In order of problems listed above:  1. I have reviewed the patient's echo study.  She has no evidence of valvular disease or LV dysfunction.  The agitated saline study is positive with bubbles demonstrated in the left heart with an 3 beats.  This is suspicious for a PFO.  The patient's only risk factor for stroke/TIA is the presence of mild hypertension.  The patient's rope score (predictive model for relationship of PFO and stroke) is 4, indicating a low to moderate risk that the PFO might be implicated in her stroke.  She does not have any large vessel atherosclerosis identified.  We had extensive discussion about the presumed mechanism of paradoxical embolism and possible relationship between the presence of PFO and TIA symptoms.  We reviewed available clinical trial data regarding secondary prevention with transcatheter closure of PFO.  The patient would like to move forward with  further evaluation, which will include transesophageal echo to assess the anatomic features of her PFO.  I have reviewed risks, indications, and alternatives to transesophageal echo with the patient.  This will be scheduled at the next available time.  If the patient has an anatomically small PFO, I would be inclined to manage her medically and not pursue any interventional treatment.  However if she has a moderate to large PFO and/or the presence of atrial septal aneurysm, it might be reasonable to consider transcatheter PFO closure.  She will be contacted after her transesophageal echo is reviewed.   Medication Adjustments/Labs and Tests Ordered: Current medicines are reviewed at length with the patient today.  Concerns regarding medicines are outlined above.  Orders Placed This Encounter  Procedures  . Basic metabolic panel  . CBC with Differential/Platelet  . EKG 12-Lead   No orders of the defined types were placed in this encounter.   Patient Instructions  COVID SCREENING INFORMATION (11/15): You are scheduled for your drive-thru COVID screening on: 05/25/2020 between noon and 2PM. Pre-Procedural COVID-19 Testing Site [address] [place], [place] [address] You will need to go home after your screening and quarantine until your procedure.   TEE INSTRUCTIONS (11/17): You are scheduled for a TEE on 05/27/2020 with Dr. Audie Box.  Please arrive at the [facility] (Main Entrance A) at [facility]: [place], [place] [address] at 11:00AM. You are allowed ONE visitor in the waiting room during your procedure. Both you and your guest must wear masks.  DIET: Nothing to eat or drink after midnight except a sip of water with medications.  Medication Instructions:  1) You may take your medications as directed with small sips of water  Labs: TODAY! CBC, BMET  You must have a responsible person to drive you home and stay in the waiting area during your procedure. Failure  to do so could result in cancellation.  Bring your insurance cards.  *Special Note: Every effort is made  to have your procedure done on time. Occasionally there are emergencies that occur at the hospital that may cause delays. Please be patient if a delay does occur.      Signed, [name], MD  05/07/2020 6:30 AM    [place]

## 2020-05-06 NOTE — Patient Instructions (Signed)
COVID SCREENING INFORMATION (11/15): You are scheduled for your drive-thru COVID screening on: 05/25/2020 between noon and 2PM. Pre-Procedural COVID-19 Testing Site [address] [place], [place] [address] You will need to go home after your screening and quarantine until your procedure.   TEE INSTRUCTIONS (11/17): You are scheduled for a TEE on 05/27/2020 with Dr. Audie Box.  Please arrive at the [facility] (Main Entrance A) at [facility]: [place], [place] [address] at 11:00AM. You are allowed ONE visitor in the waiting room during your procedure. Both you and your guest must wear masks.  DIET: Nothing to eat or drink after midnight except a sip of water with medications.  Medication Instructions:  1) You may take your medications as directed with small sips of water  Labs: TODAY! CBC, BMET  You must have a responsible person to drive you home and stay in the waiting area during your procedure. Failure to do so could result in cancellation.  Bring your insurance cards.  *Special Note: Every effort is made to have your procedure done on time. Occasionally there are emergencies that occur at the hospital that may cause delays. Please be patient if a delay does occur.

## 2020-05-06 NOTE — Progress Notes (Addendum)
Cardiology Office Note:    Date:  05/07/2020   ID:  Julia [name], DOB 09-30-1950, MRN [id number]  PCP:  [name], MD  [place] Cardiologist:  No primary care provider on file.  [facility] Electrophysiologist:  None   Referring MD: Melvenia Beam, MD   Chief Complaint  Patient presents with  . PFO Consult    History of Present Illness:    Julia [name] is a 69 y.o. female presenting for evaluation of PFO, referred by Dr. [name].  She is here alone today. She notes that she is on Rituxan for treatment of rheumatoid arthritis.  She has been concerned about her risk of COVID-19 infection on immunosuppressant therapy and she has been very cautious during the pandemic.  She had an episode of transient global amnesia last year. She states at that time she feels like she lost an entire day. She did well until August of this year when she had sudden onset of severe dizziness and expressive aphasia that lasted about 6 hours. The following month she had another amnestic event. There is concern that she is having TIA's and an echo bubble study showed evidence of right-to-left shunt. She presents today for evaluation of presumed PFO.   The patient had evaluation with an MRI of the brain that showed no acute abnormality.  CTA showed no large vessel atherosclerosis or obstruction.  The patient has been very worried about her risk of recurrent neurologic events and eager to undergo PFO evaluation in case this is playing a role.  Past Medical History:  Diagnosis Date  . H/O breast augmentation   . History of gastric bypass   . Pelvis fracture ([place])    in 30's  . Pneumonia   . RA (rheumatoid arthritis) ([place])     Past Surgical History:  Procedure Laterality Date  . APPENDECTOMY    . BREAST ENHANCEMENT SURGERY    . GASTRIC BYPASS    . TONSILLECTOMY      Current Medications: Current Meds  Medication Sig  . amLODipine (NORVASC) 2.5 MG tablet Take 1 tablet (2.5 mg total) by mouth  daily.  . Ascorbic Acid (VITAMIN C PO) Take 1,600 mg by mouth. 2 caps daily.  Marland Kitchen aspirin EC 81 MG tablet Take 1 tablet (81 mg total) by mouth daily. Swallow whole.  . calcium-vitamin D (OSCAL WITH D) 500-200 MG-UNIT tablet Take by mouth. Calcium 1300 mg D3 1000 UI 2 capsules daily.  . Cholecalciferol (VITAMIN D3) 25 MCG (1000 UT) CAPS Take 5 capsules by mouth daily.  . Coenzyme Q10 (COQ-10) 100 MG CAPS Take 200 mg by mouth daily.  . IVERMECTIN PO Take 3 mg by mouth once a week. Pet patient taking 5 tablets on Sunday.  Marland Kitchen KRILL OIL PO Take 1 capsule by mouth daily. 1,000-170-50-80 mg Cap  . Lactobacillus (PROBIOTIC ACIDOPHILUS PO) Take 1 each by mouth daily. 100 million live culture 0.5 mg value  . levothyroxine (SYNTHROID) 25 MCG tablet TAKE 1 TABLET BY MOUTH DAILY  . Loratadine (CLARITIN PO) Take 1 tablet by mouth daily.  . Misc Natural Products (ELDERBERRY ZINC/VIT C/IMMUNE MT) Use as directed 2 each in the mouth or throat daily. Elderberry 100 mg Zinc 8 mg Vitamin C 90 mg  . Multiple Vitamins-Minerals (CENTRUM SILVER PO) Take 1 each by mouth daily.  Marland Kitchen orlistat (ALLI) 60 MG capsule Take 180 mg by mouth. Daily with meals  . OVER THE COUNTER MEDICATION Take 2 tablets by mouth daily. Glucosam-MSM-Chond-Bosw-Hyalur 750-50-100 mg  TB12. 1500 mg/1194 MG tablets triple strength  . pseudoephedrine-guaifenesin (MUCINEX D) 60-600 MG 12 hr tablet Take 1 tablet by mouth daily.  Marland Kitchen pyridOXINE (VITAMIN B-6) 100 MG tablet Take 200 mg by mouth daily.  . riTUXimab (RITUXAN) 500 MG/50ML injection Inject 1,000 mg into the vein. 2 infusions two weeks apart every 6 months.  . TURMERIC CURCUMIN PO Take 2 capsules by mouth daily. 500 mg  . UNABLE TO FIND 1 each daily. Med Name: Fish Oil 1000 mg (120 mg-180 mg) capsule  . UNABLE TO FIND Take 2 capsules by mouth daily. Med Name: Monolaurin 936 mg and Zinc 15 mg  . valACYclovir (VALTREX) 500 MG tablet TAKE 1 TABLET(500 MG) BY MOUTH DAILY  . venlafaxine XR (EFFEXOR-XR)  37.5 MG 24 hr capsule Take 37.5 mg by mouth daily with breakfast.     Allergies:   Methotrexate derivatives   Social History   Socioeconomic History  . Marital status: Married    Spouse name: Not on file  . Number of children: 0  . Years of education: Not on file  . Highest education level: High school graduate  Occupational History  . 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 Smoking status: Never Smoker  . Smokeless tobacco: Never Used  Substance and Sexual Activity  . Alcohol use: Yes    Alcohol/week: 3.0 - 5.0 standard drinks    Types: 3 - 5 Glasses of wine per week  . Drug use: Never  . Sexual activity: Not on file  Other Topics Concern  . Not on file  Social History Narrative   Retired 07/10/17 from [employer]   No regular exercise      Lives at home with husband   Caffeine: 2 glasses/day diet dr pepper   Social Determinants of Health   Financial Resource Strain:   . Difficulty of Paying Living Expenses: Not on file  Food Insecurity:   . Worried About Charity fundraiser in the Last Year: Not on file  . Ran Out of Food in the Last Year: Not on file  Transportation Needs:   . Lack of Transportation (Medical): Not on file  . Lack of Transportation (Non-Medical): Not on file  Physical Activity:   . Days of Exercise per Week: Not on file  . Minutes of Exercise per Session: Not on file  Stress:   . Feeling of Stress : Not on file  Social Connections:   . Frequency of Communication with Friends and Family: Not on file  . Frequency of Social Gatherings with Friends and Family: Not on file  . Attends Religious Services: Not on file  . Active Member of Clubs or Organizations: Not on file  . Attends Archivist Meetings: Not on file  . Marital Status: Not on file     Family History: The patient's family history includes Gallstones in her father; Heart attack in her maternal grandmother; Heart disease in her mother; Heart failure in her  mother; Hypertension in her brother, father, mother, and sister; Kidney Stones in her father; Ulcers in her father.  ROS:   Please see the history of present illness.    All other systems reviewed and are negative.  EKGs/Labs/Other Studies Reviewed:    The following studies were reviewed today: Echo 04-23-2020: IMPRESSIONS    1. Positive saline microcavitation study.  2. Left ventricular ejection fraction, by estimation, is 60 to 65%. The  left ventricle has normal function. The left  ventricle has no regional  wall motion abnormalities. Left ventricular diastolic parameters were  normal.  3. Right ventricular systolic function is normal. The right ventricular  size is normal. Tricuspid regurgitation signal is inadequate for assessing  PA pressure.  4. The mitral valve is normal in structure. No evidence of mitral valve  regurgitation. No evidence of mitral stenosis.  5. The aortic valve is tricuspid. Aortic valve regurgitation is not  visualized. No aortic stenosis is present.  6. The inferior vena cava is normal in size with greater than 50%  respiratory variability, suggesting right atrial pressure of 3 mmHg.  7. Agitated saline contrast bubble study was positive with shunting  observed within 3-6 cardiac cycles suggestive of interatrial shunt.   CTA Head and Neck 02-14-2020: FINDINGS: CTA NECK FINDINGS  Aortic arch: Standard branching. Imaged portion shows no evidence of aneurysm or dissection. No significant stenosis of the major arch vessel origins. No significant atherosclerotic disease in the aortic arch.  Right carotid system: Normal right carotid without stenosis or dissection. Negative for atherosclerotic disease.  Left carotid system: Normal left carotid. Negative for stenosis or dissection. Negative for atherosclerotic disease  Vertebral arteries: Both vertebral arteries are normal and patent to the basilar. Left vertebral artery is  dominant.  Skeleton: Multilevel cervical spondylosis. No acute skeletal abnormality identified.  Other neck: Negative  Upper chest: Lung apices clear bilaterally.  Review of the MIP images confirms the above findings  CTA HEAD FINDINGS  Anterior circulation: Cavernous carotid widely patent bilaterally with minimal atherosclerotic calcification bilaterally. Anterior and middle cerebral arteries normal bilaterally. No stenosis or large vessel occlusion  Posterior circulation: Both vertebral arteries patent to the basilar. Right PICA patent. Left PICA not visualized. Dominant left AICA patent splaying the PICA territory. Basilar widely patent. Superior cerebellar and posterior cerebral arteries patent bilaterally. Fetal origin left posterior cerebral artery. Negative for large vessel occlusion.  Venous sinuses: Normal venous enhancement  Anatomic variants: None  Review of the MIP images confirms the above findings  IMPRESSION: 1. Normal CT angiogram of the head and neck. Negative for large vessel occlusion. No significant atherosclerotic disease.  MRI Brain 02-14-2020: FINDINGS: Brain: There is no evidence of an acute infarct, intracranial hemorrhage, mass, midline shift, or extra-axial fluid collection. The ventricles and sulci are within normal limits for age. The brain is normal in signal.  Vascular: Major intracranial vascular flow voids are preserved.  Skull and upper cervical spine: Unremarkable bone marrow signal.  Sinuses/Orbits: Unremarkable orbits. Clear paranasal sinuses. Small right mastoid effusion.  Other: None.  IMPRESSION: Unremarkable appearance of the brain.  EKG:  EKG is ordered today.  The ekg ordered today demonstrates NSR 67 bpm, within normal limits  Recent Labs: 09/02/2019: TSH 1.45 02/14/2020: ALT 21 05/06/2020: BUN 13; Creatinine, Ser 0.57; Hemoglobin 13.7; Platelets 327; Potassium 3.8; Sodium 137  Recent Lipid Panel     Component Value Date/Time   CHOL 169 09/02/2019 0926   TRIG 62.0 09/02/2019 0926   HDL 73.30 09/02/2019 0926   CHOLHDL 2 09/02/2019 0926   VLDL 12.4 09/02/2019 0926   LDLCALC 83 09/02/2019 0926     Risk Assessment/Calculations:       Physical Exam:    VS:  BP (!) 150/90 Comment: BP 126/90 at 11:44 am  Pulse 67   Ht 5\' 4"  (1.626 m)   Wt 185 lb 12.8 oz (84.3 kg)   SpO2 97%   BMI 31.89 kg/m     Wt Readings from Last 3 Encounters:  05/06/20 185 lb 12.8 oz (84.3 kg)  04/22/20 186 lb (84.4 kg)  02/24/20 176 lb (79.8 kg)    GEN:  Well nourished, well developed in no acute distress HEENT: Normal NECK: No JVD; No carotid bruits LYMPHATICS: No lymphadenopathy CARDIAC: RRR, no murmurs, rubs, gallops RESPIRATORY:  Clear to auscultation without rales, wheezing or rhonchi  ABDOMEN: Soft, non-tender, non-distended MUSCULOSKELETAL:  No edema; No deformity  SKIN: Warm and dry NEUROLOGIC:  Alert and oriented x 3 PSYCHIATRIC:  Normal affect   ASSESSMENT:    1. PFO (patent foramen ovale)    PLAN:    In order of problems listed above:  1. I have reviewed the patient's echo study.  She has no evidence of valvular disease or LV dysfunction.  The agitated saline study is positive with bubbles demonstrated in the left heart with an 3 beats.  This is suspicious for a PFO.  The patient's only risk factor for stroke/TIA is the presence of mild hypertension.  The patient's rope score (predictive model for relationship of PFO and stroke) is 4, indicating a low to moderate risk that the PFO might be implicated in her stroke.  She does not have any large vessel atherosclerosis identified.  We had extensive discussion about the presumed mechanism of paradoxical embolism and possible relationship between the presence of PFO and TIA symptoms.  We reviewed available clinical trial data regarding secondary prevention with transcatheter closure of PFO.  The patient would like to move forward with  further evaluation, which will include transesophageal echo to assess the anatomic features of her PFO.  I have reviewed risks, indications, and alternatives to transesophageal echo with the patient.  This will be scheduled at the next available time.  If the patient has an anatomically small PFO, I would be inclined to manage her medically and not pursue any interventional treatment.  However if she has a moderate to large PFO and/or the presence of atrial septal aneurysm, it might be reasonable to consider transcatheter PFO closure.  She will be contacted after her transesophageal echo is reviewed.   Medication Adjustments/Labs and Tests Ordered: Current medicines are reviewed at length with the patient today.  Concerns regarding medicines are outlined above.  Orders Placed This Encounter  Procedures  . Basic metabolic panel  . CBC with Differential/Platelet  . EKG 12-Lead   No orders of the defined types were placed in this encounter.   Patient Instructions  COVID SCREENING INFORMATION (11/15): You are scheduled for your drive-thru COVID screening on: 05/25/2020 between noon and 2PM. Pre-Procedural COVID-19 Testing Site [address] [place], [place] [address] You will need to go home after your screening and quarantine until your procedure.   TEE INSTRUCTIONS (11/17): You are scheduled for a TEE on 05/27/2020 with Dr. Audie Box.  Please arrive at the [facility] (Main Entrance A) at [facility]: [place], [place] [address] at 11:00AM. You are allowed ONE visitor in the waiting room during your procedure. Both you and your guest must wear masks.  DIET: Nothing to eat or drink after midnight except a sip of water with medications.  Medication Instructions:  1) You may take your medications as directed with small sips of water  Labs: TODAY! CBC, BMET  You must have a responsible person to drive you home and stay in the waiting area during your procedure. Failure  to do so could result in cancellation.  Bring your insurance cards.  *Special Note: Every effort is made  to have your procedure done on time. Occasionally there are emergencies that occur at the hospital that may cause delays. Please be patient if a delay does occur.      Signed, [name], MD  05/07/2020 6:30 AM    [facility]  ADDENDUM: I have spoken with the patient about her TEE results in detail now on multiple occasions. I have communicated with Dr [name] as well. The patient understands that she now has an indication for long term anticoagulation and will remain on eliquis. She would like to move forward with PFO closure and feels strongly about having the procedure done so that she is protected in the future in the case of need for interruption of anticoagulation, noncardiac surgery, etc. I have previously reviewed risks, indications, and alternatives to PFO closure with the patient. She also understands that she will be at a slightly higher risk of AFib following device closure. The patient wishes to proceed with closure at the next available time.   [name] 06/07/2020 1:15 PM

## 2020-05-07 ENCOUNTER — Encounter: Payer: Self-pay | Admitting: Cardiovascular Disease

## 2020-05-08 ENCOUNTER — Other Ambulatory Visit: Payer: Self-pay | Admitting: Internal Medicine

## 2020-05-08 ENCOUNTER — Encounter (INDEPENDENT_AMBULATORY_CARE_PROVIDER_SITE_OTHER): Payer: Medicare Other

## 2020-05-08 ENCOUNTER — Encounter: Payer: Self-pay | Admitting: Internal Medicine

## 2020-05-08 DIAGNOSIS — G459 Transient cerebral ischemic attack, unspecified: Secondary | ICD-10-CM

## 2020-05-08 DIAGNOSIS — R42 Dizziness and giddiness: Secondary | ICD-10-CM | POA: Diagnosis not present

## 2020-05-08 DIAGNOSIS — I4891 Unspecified atrial fibrillation: Secondary | ICD-10-CM | POA: Diagnosis not present

## 2020-05-09 DIAGNOSIS — I4891 Unspecified atrial fibrillation: Secondary | ICD-10-CM | POA: Diagnosis not present

## 2020-05-09 DIAGNOSIS — R42 Dizziness and giddiness: Secondary | ICD-10-CM | POA: Diagnosis not present

## 2020-05-09 DIAGNOSIS — G459 Transient cerebral ischemic attack, unspecified: Secondary | ICD-10-CM | POA: Diagnosis not present

## 2020-05-18 ENCOUNTER — Institutional Professional Consult (permissible substitution): Payer: Medicare Other | Admitting: Cardiovascular Disease

## 2020-05-19 ENCOUNTER — Telehealth: Payer: Self-pay | Admitting: Cardiovascular Disease

## 2020-05-19 DIAGNOSIS — I4891 Unspecified atrial fibrillation: Secondary | ICD-10-CM

## 2020-05-19 NOTE — Telephone Encounter (Signed)
Esther with [employer] is calling with critical EKG results.   Called NL triage 2x with no answer, called [facility] triage then [facility] hung up.

## 2020-05-19 NOTE — Telephone Encounter (Signed)
Esther with [employer] called tor report that the pt at 9:30 am had AFIB with RVR 110-130.   Monitor ordered by her neurologist Dr. [name].   Strips to be faxed to our office for the DOD to review.   Dr. Meda Coffee the DOD had reviewed the strips..new AFIB with referral to the Afib clinic for possible anticoagulation initiation.    LM with  Dr. Cathren Laine office [phone] to report the finding on the monitor... the nurses were at lunch and the [facility] will have them call us back this afternoon.   LM for the pt to call back.   Will fax to Dr. [name] office.

## 2020-05-19 NOTE — Telephone Encounter (Signed)
Spoke with the patient. She will in be in [facility] tomorrow.  Scheduled her for Afib Clinic evaluation tomorrow at [facility]. She was grateful for assistance.

## 2020-05-19 NOTE — Telephone Encounter (Signed)
Thank you :)

## 2020-05-19 NOTE — Telephone Encounter (Signed)
I called [name] back @ [facility] but she was out of the office. I can see the messages in pt's chart that there was an episode of A-fib and pt spoke with the heart care team and has an appt with A-fib clinic evaluation tomorrow 11/10 @ 3 pm.

## 2020-05-19 NOTE — Telephone Encounter (Signed)
[name] from [phone]) is asking for a call from [place] re: the heart monitor ordered by Dr [name], Lelon Frohlich states there is an abnormality.  Please call

## 2020-05-20 ENCOUNTER — Encounter (HOSPITAL_COMMUNITY): Payer: Self-pay | Admitting: Nurse Practitioner

## 2020-05-20 ENCOUNTER — Ambulatory Visit (HOSPITAL_COMMUNITY)
Admission: RE | Admit: 2020-05-20 | Discharge: 2020-05-20 | Disposition: A | Discharge status: Home / Self Care | Payer: Medicare Other | Source: Ambulatory Visit | Attending: Nurse Practitioner | Admitting: Nurse Practitioner

## 2020-05-20 ENCOUNTER — Other Ambulatory Visit: Payer: Self-pay

## 2020-05-20 VITALS — BP 110/70 | HR 72 | Ht 64.0 in | Wt 186.2 lb

## 2020-05-20 DIAGNOSIS — Z79899 Other long term (current) drug therapy: Secondary | ICD-10-CM | POA: Insufficient documentation

## 2020-05-20 DIAGNOSIS — M0579 Rheumatoid arthritis with rheumatoid factor of multiple sites without organ or systems involvement: Secondary | ICD-10-CM | POA: Diagnosis not present

## 2020-05-20 DIAGNOSIS — Z888 Allergy status to other drugs, medicaments and biological substances status: Secondary | ICD-10-CM | POA: Diagnosis not present

## 2020-05-20 DIAGNOSIS — Z7982 Long term (current) use of aspirin: Secondary | ICD-10-CM | POA: Diagnosis not present

## 2020-05-20 DIAGNOSIS — Z8249 Family history of ischemic heart disease and other diseases of the circulatory system: Secondary | ICD-10-CM | POA: Diagnosis not present

## 2020-05-20 DIAGNOSIS — Z9884 Bariatric surgery status: Secondary | ICD-10-CM | POA: Diagnosis not present

## 2020-05-20 DIAGNOSIS — R5383 Other fatigue: Secondary | ICD-10-CM | POA: Diagnosis not present

## 2020-05-20 DIAGNOSIS — D6869 Other thrombophilia: Secondary | ICD-10-CM

## 2020-05-20 DIAGNOSIS — Z79890 Hormone replacement therapy (postmenopausal): Secondary | ICD-10-CM | POA: Insufficient documentation

## 2020-05-20 DIAGNOSIS — I4891 Unspecified atrial fibrillation: Secondary | ICD-10-CM | POA: Diagnosis not present

## 2020-05-20 MED ORDER — APIXABAN 5 MG PO TABS: 5.0000 mg | ORAL_TABLET | Freq: Two times a day (BID) | ORAL | 3 refills | Status: DC

## 2020-05-20 NOTE — Progress Notes (Addendum)
Primary Care Physician: [name], MD Referring Physician:  [name]  Neurology: Dr. [name] Cardiology: Dr. [name] is a 69 y.o. female with a h/o HTN, TIA, small PFO, recently dx'ed, evaluated by Dr. [name], seen by Dr. [name] recently and a  monitor  was placed 2/2 to her symptoms . Afib was reported yesterday by monitor company with short episode  new  onset of afib with v rates of 110-130. She felt off but not that symptomatic with episode. She is now in the afib clinic for evaluation. She  is in SR. She will wear the monitor for another 10 days.   She  gives me the background on her strange intermittent  neurological symptoms for the last year and recently having a PFO found. She is having a TEE next week ordered by Dr. [name].   She also is very concerned regarding  being immunocomprised with her  RA. She has had the vaccines but was tested for antibodies and did not have any. Therefore, she has started going to a holistic group in [facility] and is on a whole list of supplements including Boswellia, andrograhis, Cat's Claw, 5 Mushroom Blend, Bio-Quercetin, and Liposomal Vit C as well as Ivermectin 5 tablets(3mg ) every week on Sunday.   We discussed afib stroke  risk with a CHA2DS2VASc score of 5. I would like her to start eliquis 5 mg bid to reduce her stroke risk but I am concerned to start with all the above supplements and possible interactions. I also am concerned that since she just started taking all the supplements one week ago  that there may be stimulants in them  that can trigger afib. I will have PharmD to run thru a data base to look for interactions.   Today, she denies symptoms of palpitations, chest pain, shortness of breath, orthopnea, PND, lower extremity edema, dizziness, presyncope, syncope, or neurologic sequela. The patient is tolerating medications without difficulties and is otherwise without complaint today.   Past Medical History:    Diagnosis Date  . H/O breast augmentation   . History of gastric bypass   . Pelvis fracture ([place])    in 30's  . Pneumonia   . RA (rheumatoid arthritis) ([place])    Past Surgical History:  Procedure Laterality Date  . APPENDECTOMY    . BREAST ENHANCEMENT SURGERY    . GASTRIC BYPASS    . TONSILLECTOMY      Current Outpatient Medications  Medication Sig Dispense Refill  . amLODipine (NORVASC) 2.5 MG tablet Take 1 tablet (2.5 mg total) by mouth daily. 90 tablet 3  . Ascorbic Acid (VITAMIN C PO) Take 1,600 mg by mouth. 2 caps daily.    Marland Kitchen aspirin EC 81 MG tablet Take 1 tablet (81 mg total) by mouth daily. Swallow whole. 30 tablet 11  . Calcium Carbonate-Vitamin D 600-200 MG-UNIT TABS Take by mouth 2 (two) times daily.    . calcium-vitamin D (OSCAL WITH D) 500-200 MG-UNIT tablet Take by mouth. Calcium 1300 mg D3 1000 UI 2 capsules daily.    . Cholecalciferol (VITAMIN D3) 25 MCG (1000 UT) CAPS Take 5 capsules by mouth daily.    . Coenzyme Q10 (COQ-10) 100 MG CAPS Take 200 mg by mouth daily.    . IVERMECTIN PO Take 3 mg by mouth once a week. Pet patient taking 5 tablets on Sunday.    Marland Kitchen KRILL OIL PO Take 1 capsule by mouth daily. 1,000-170-50-80 mg Cap    .  Lactobacillus (PROBIOTIC ACIDOPHILUS PO) Take 1 each by mouth daily. 100 million live culture 0.5 mg value    . levothyroxine (SYNTHROID) 25 MCG tablet TAKE 1 TABLET BY MOUTH DAILY 90 tablet 0  . Loratadine (CLARITIN PO) Take 1 tablet by mouth daily.    . Misc Natural Products (ELDERBERRY ZINC/VIT C/IMMUNE MT) Use as directed 2 each in the mouth or throat daily. Elderberry 100 mg Zinc 8 mg Vitamin C 90 mg    . Multiple Vitamins-Minerals (CENTRUM SILVER PO) Take 1 each by mouth daily.    Marland Kitchen orlistat (ALLI) 60 MG capsule Take 180 mg by mouth. Daily with meals    . OVER THE COUNTER MEDICATION Take 2 tablets by mouth daily. Glucosam-MSM-Chond-Bosw-Hyalur 750-50-100 mg TB12. 1500 mg/1194 MG tablets triple strength    . pseudoephedrine-guaifenesin  (MUCINEX D) 60-600 MG 12 hr tablet Take 1 tablet by mouth daily.    Marland Kitchen pyridOXINE (VITAMIN B-6) 100 MG tablet Take 200 mg by mouth daily.    . riTUXimab (RITUXAN) 500 MG/50ML injection Inject 1,000 mg into the vein. 2 infusions two weeks apart every 6 months.    . TURMERIC CURCUMIN PO Take 2 capsules by mouth daily. 500 mg    . UNABLE TO FIND 1 each daily. Med Name: Fish Oil 1000 mg (120 mg-180 mg) capsule    . UNABLE TO FIND Take 2 capsules by mouth daily. Med Name: Monolaurin 936 mg and Zinc 15 mg    . valACYclovir (VALTREX) 500 MG tablet TAKE 1 TABLET(500 MG) BY MOUTH DAILY 30 tablet 11  . venlafaxine XR (EFFEXOR-XR) 37.5 MG 24 hr capsule Take 37.5 mg by mouth daily with breakfast.     No current facility-administered medications for this encounter.    Allergies  Allergen Reactions  . Methotrexate Derivatives     Social History   Socioeconomic History  . Marital status: Married    Spouse name: Not on file  . Number of children: 0  . Years of education: Not on file  . Highest education level: High school graduate  Occupational History  . 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 Smoking status: Never Smoker  . Smokeless tobacco: Never Used  Substance and Sexual Activity  . Alcohol use: Yes    Alcohol/week: 3.0 - 5.0 standard drinks    Types: 3 - 5 Glasses of wine per week  . Drug use: Never  . Sexual activity: Not on file  Other Topics Concern  . Not on file  Social History Narrative   Retired 07/10/17 from [employer]   No regular exercise      Lives at home with husband   Caffeine: 2 glasses/day diet dr pepper   Social Determinants of Health   Financial Resource Strain:   . Difficulty of Paying Living Expenses: Not on file  Food Insecurity:   . Worried About Charity fundraiser in the Last Year: Not on file  . Ran Out of Food in the Last Year: Not on file  Transportation Needs:   . Lack of Transportation (Medical): Not on file  . Lack of  Transportation (Non-Medical): Not on file  Physical Activity:   . Days of Exercise per Week: Not on file  . Minutes of Exercise per Session: Not on file  Stress:   . Feeling of Stress : Not on file  Social Connections:   . Frequency of Communication with Friends and Family: Not on file  . Frequency of  Social Gatherings with Friends and Family: Not on file  . Attends Religious Services: Not on file  . Active Member of Clubs or Organizations: Not on file  . Attends Archivist Meetings: Not on file  . Marital Status: Not on file  Intimate Partner Violence:   . Fear of Current or Ex-Partner: Not on file  . Emotionally Abused: Not on file  . Physically Abused: Not on file  . Sexually Abused: Not on file    Family History  Problem Relation Age of Onset  . Hypertension Mother   . Heart disease Mother   . Heart failure Mother   . Hypertension Father   . Ulcers Father   . Gallstones Father   . Kidney Stones Father   . Hypertension Sister   . Hypertension Brother   . Heart attack Maternal Grandmother     ROS- All systems are reviewed and negative except as per the HPI above  Physical Exam: Vitals:   05/20/20 1448  Height: 5\' 4"  (1.626 m)   Wt Readings from Last 3 Encounters:  05/06/20 84.3 kg  04/22/20 84.4 kg  02/24/20 79.8 kg    Labs: Lab Results  Component Value Date   NA 137 05/06/2020   K 3.8 05/06/2020   CL 101 05/06/2020   CO2 22 05/06/2020   GLUCOSE 92 05/06/2020   BUN 13 05/06/2020   CREATININE 0.57 05/06/2020   CALCIUM 9.3 05/06/2020   Lab Results  Component Value Date   INR 1.0 02/14/2020   Lab Results  Component Value Date   CHOL 169 09/02/2019   HDL 73.30 09/02/2019   LDLCALC 83 09/02/2019   TRIG 62.0 09/02/2019     GEN- The patient is well appearing, alert and oriented x 3 today.   Head- normocephalic, atraumatic Eyes-  Sclera clear, conjunctiva pink Ears- hearing intact Oropharynx- clear Neck- supple, no JVP Lymph- no  cervical lymphadenopathy Lungs- Clear to ausculation bilaterally, normal work of breathing Heart- Regular rate and rhythm, no murmurs, rubs or gallops, PMI not laterally displaced GI- soft, NT, ND, + BS Extremities- no clubbing, cyanosis, or edema MS- no significant deformity or atrophy Skin- no rash or lesion Psych- euthymic mood, full affect Neuro- strength and sensation are intact  EKG- NSR at 72 bpm, Pr int 178 ms, qrs int 82 ms, qtc 451 ms  Strips reviewed - show afib with rvr Epic records reviewed  Echo bubble study 04/23/20- . Positive saline microcavitation study.  2. Left ventricular ejection fraction, by estimation, is 60 to 65%. The  left ventricle has normal function. The left ventricle has no regional  wall motion abnormalities. Left ventricular diastolic parameters were  normal.  3. Right ventricular systolic function is normal. The right ventricular  size is normal. Tricuspid regurgitation signal is inadequate for assessing  PA pressure.  4. The mitral valve is normal in structure. No evidence of mitral valve  regurgitation. No evidence of mitral stenosis.  5. The aortic valve is tricuspid. Aortic valve regurgitation is not  visualized. No aortic stenosis is present.  6. The inferior vena cava is normal in size with greater than 50%  respiratory variability, suggesting right atrial pressure of 3 mmHg.  7. Agitated saline contrast bubble study was positive with shunting  observed within 3-6 cardiac cycles suggestive of interatrial shunt.   Assessment and Plan: 1. New onset afib  One episode seen on event monitor  General education re afib  Triggers discussed  Stop Muccinex-D can take plain  Muccinex Continue wearing event monitor  2. CHA2DS2VASc score of at least 5 Discussed stroke risk and risk benefit of anticoagulation  Would like to prescribe eliquis 5 mg bid but she is on multiple supplements that could interact with Eliquis or have stimulants that may  trigger afib I have sent her meds/supplements  to PharmD to see if any interactions She has the rx for eliquis and I will notify to start once PharmD reviews med list She will stop ASA once she start eliquis She will aslo stop tumeric She is willing to stop any supplements that may interact  I will discuss with pt again once PharmD gets back to me   F/u in 2 weeks  Addendum- 05/21/20 10:50- pt called to start eliquis.  PharmD indidcated that the biggest issues with supplements and bleeding risk with eliquis would be tumeric. I already told pt to stop this. She  does not have enough reliable info re a lot of her supplements to indicate a risk from all else she is taking. She will be ok to start eliquis 5 mg bid but I will also ask  her to let her holistic clinic know that anticoagulation is being started as well.    Geroge Baseman Pablo Stauffer, [facility] [address] [place], [place] [address] [phone]

## 2020-05-20 NOTE — Patient Instructions (Signed)
Stop aspirin and Start Eliquis 5mg  twice a day

## 2020-05-21 ENCOUNTER — Other Ambulatory Visit: Payer: Self-pay | Admitting: Cardiovascular Disease

## 2020-05-21 DIAGNOSIS — Q211 Atrial septal defect: Secondary | ICD-10-CM

## 2020-05-21 DIAGNOSIS — Q2112 Patent foramen ovale: Secondary | ICD-10-CM

## 2020-05-21 NOTE — Addendum Note (Signed)
Encounter addended by: Sherran Needs, NP on: 05/21/2020 10:57 AM  Actions taken: Clinical Note Signed

## 2020-05-22 ENCOUNTER — Telehealth: Payer: Self-pay | Admitting: *Deleted

## 2020-05-22 NOTE — Telephone Encounter (Signed)
Received [employer] monitor report from 05/21/21 1:06 pm showing AFib, rate 160. Pt reports asymptomatic, states she didn't feel a thing. Pt seen by AFib clinic Wednesday to address new AFib/anticoagulation. Advised will continue to monitor. Patient verbalized understanding and agreeable to plan.

## 2020-05-25 ENCOUNTER — Other Ambulatory Visit (HOSPITAL_COMMUNITY)
Admission: RE | Admit: 2020-05-25 | Discharge: 2020-05-25 | Disposition: A | Discharge status: Home / Self Care | Payer: Medicare Other | Source: Ambulatory Visit | Attending: Cardiovascular Disease | Admitting: Cardiovascular Disease

## 2020-05-25 DIAGNOSIS — Z01812 Encounter for preprocedural laboratory examination: Secondary | ICD-10-CM | POA: Insufficient documentation

## 2020-05-25 DIAGNOSIS — Z20822 Contact with and (suspected) exposure to covid-19: Secondary | ICD-10-CM | POA: Insufficient documentation

## 2020-05-25 LAB — SARS CORONAVIRUS 2 (TAT 6-24 HRS): SARS Coronavirus 2: NEGATIVE

## 2020-05-27 ENCOUNTER — Ambulatory Visit (HOSPITAL_COMMUNITY)
Admission: RE | Admit: 2020-05-27 | Discharge: 2020-05-27 | Disposition: A | Discharge status: Home / Self Care | Payer: Medicare Other | Attending: Cardiovascular Disease | Admitting: Cardiovascular Disease

## 2020-05-27 ENCOUNTER — Encounter (HOSPITAL_COMMUNITY): Payer: Self-pay | Admitting: Cardiovascular Disease

## 2020-05-27 ENCOUNTER — Ambulatory Visit (HOSPITAL_COMMUNITY): Payer: Medicare Other | Admitting: Certified Registered Nurse Anesthetist

## 2020-05-27 ENCOUNTER — Encounter (HOSPITAL_COMMUNITY)
Admission: RE | Disposition: A | Discharge status: Home / Self Care | Payer: Self-pay | Source: Home / Self Care | Attending: Cardiovascular Disease

## 2020-05-27 ENCOUNTER — Ambulatory Visit (HOSPITAL_BASED_OUTPATIENT_CLINIC_OR_DEPARTMENT_OTHER): Payer: Medicare Other

## 2020-05-27 ENCOUNTER — Other Ambulatory Visit: Payer: Self-pay

## 2020-05-27 DIAGNOSIS — Z79899 Other long term (current) drug therapy: Secondary | ICD-10-CM | POA: Diagnosis not present

## 2020-05-27 DIAGNOSIS — Q2112 Patent foramen ovale: Secondary | ICD-10-CM

## 2020-05-27 DIAGNOSIS — Q211 Atrial septal defect: Secondary | ICD-10-CM | POA: Diagnosis not present

## 2020-05-27 DIAGNOSIS — E039 Hypothyroidism, unspecified: Secondary | ICD-10-CM | POA: Diagnosis not present

## 2020-05-27 DIAGNOSIS — Z7982 Long term (current) use of aspirin: Secondary | ICD-10-CM | POA: Diagnosis not present

## 2020-05-27 DIAGNOSIS — M069 Rheumatoid arthritis, unspecified: Secondary | ICD-10-CM | POA: Diagnosis not present

## 2020-05-27 DIAGNOSIS — I081 Rheumatic disorders of both mitral and tricuspid valves: Secondary | ICD-10-CM | POA: Diagnosis not present

## 2020-05-27 DIAGNOSIS — I10 Essential (primary) hypertension: Secondary | ICD-10-CM | POA: Diagnosis not present

## 2020-05-27 HISTORY — DX: Essential (primary) hypertension: I10

## 2020-05-27 HISTORY — PX: BUBBLE STUDY: SHX6837

## 2020-05-27 HISTORY — PX: TEE WITHOUT CARDIOVERSION: SHX5443

## 2020-05-27 SURGERY — ECHOCARDIOGRAM, TRANSESOPHAGEAL
Anesthesia: Monitor Anesthesia Care

## 2020-05-27 MED ORDER — PROPOFOL 10 MG/ML IV BOLUS
INTRAVENOUS | Status: DC | PRN
  Administered 2020-05-27: 30 mg via INTRAVENOUS

## 2020-05-27 MED ORDER — PROPOFOL 500 MG/50ML IV EMUL
INTRAVENOUS | Status: DC | PRN
  Administered 2020-05-27: 100 ug/kg/min via INTRAVENOUS

## 2020-05-27 MED ORDER — SODIUM CHLORIDE 0.9 % IV SOLN: INTRAVENOUS | Status: DC

## 2020-05-27 MED ORDER — PHENYLEPHRINE HCL (PRESSORS) 10 MG/ML IV SOLN
INTRAVENOUS | Status: DC | PRN
  Administered 2020-05-27: 80 ug via INTRAVENOUS

## 2020-05-27 MED ORDER — LIDOCAINE 2% (20 MG/ML) 5 ML SYRINGE
INTRAMUSCULAR | Status: DC | PRN
  Administered 2020-05-27: 60 mg via INTRAVENOUS

## 2020-05-27 NOTE — CV Procedure (Signed)
    TRANSESOPHAGEAL ECHOCARDIOGRAM   NAME:  Julia [name]    MRN: [id number] DOB:  1950-07-12    ADMIT DATE: 05/27/2020  INDICATIONS: PFO  PROCEDURE:   Informed consent was obtained prior to the procedure. The risks, benefits and alternatives for the procedure were discussed and the patient comprehended these risks.  Risks include, but are not limited to, cough, sore throat, vomiting, nausea, somnolence, esophageal and stomach trauma or perforation, bleeding, low blood pressure, aspiration, pneumonia, infection, trauma to the teeth and death.    Procedural time out performed. The oropharynx was anesthetized with topical 1% benzocaine.    Anesthesia was administered by Dr. Ermalene Postin.  The patient was administered a total of 260 mg propofol.  The patient's heart rate, blood pressure, and oxygen saturation are monitored continuously during the procedure. The period of conscious sedation is 20 minutes, of which I was present face-to-face 100% of this time.   The transesophageal probe was inserted in the esophagus and stomach without difficulty and multiple views were obtained.   COMPLICATIONS:    There were no immediate complications.  KEY FINDINGS:  1. Small to moderate PFO by bubble study. 2. Normal LF function, EF 60-65%.  3. Full report to follow. 4. Further management per primary team.   Addison Naegeli. Audie Box, [place]  [address], [place], [place] [address] [phone]  11:48 AM

## 2020-05-27 NOTE — Progress Notes (Signed)
°  Echocardiogram 2D Echocardiogram has been performed.  Julia [name] 05/27/2020, 11:58 AM

## 2020-05-27 NOTE — Interval H&P Note (Signed)
History and Physical Interval Note:  05/27/2020 11:03 AM  Julia [name]  has presented today for surgery, with the diagnosis of PFO.  The various methods of treatment have been discussed with the patient and family. After consideration of risks, benefits and other options for treatment, the patient has consented to  Procedure(s): TRANSESOPHAGEAL ECHOCARDIOGRAM (TEE) (N/A) as a surgical intervention.  The patient's history has been reviewed, patient examined, no change in status, stable for surgery.  I have reviewed the patient's chart and labs.  Questions were answered to the patient's satisfaction.     TEE for PFO. NPO since midnight.   [place] T. Audie Box, [place]  [address] [place], [place] [address] [phone]  11:04 AM

## 2020-05-27 NOTE — Anesthesia Procedure Notes (Signed)
Procedure Name: MAC Date/Time: 05/27/2020 11:13 AM Performed by: Kathryne Hitch, CRNA Pre-anesthesia Checklist: Patient identified, Emergency Drugs available, Suction available and Patient being monitored Patient Re-evaluated:Patient Re-evaluated prior to induction Oxygen Delivery Method: Nasal cannula Induction Type: IV induction Placement Confirmation: positive ETCO2

## 2020-05-27 NOTE — Discharge Instructions (Signed)

## 2020-05-27 NOTE — Transfer of Care (Signed)
Immediate Anesthesia Transfer of Care Note  Patient: Julia [name]  Procedure(s) Performed: TRANSESOPHAGEAL ECHOCARDIOGRAM (TEE) (N/A ) BUBBLE STUDY  Patient Location: Endoscopy Unit  Anesthesia Type:MAC  Level of Consciousness: drowsy and patient cooperative  Airway & Oxygen Therapy: Patient Spontanous Breathing  Post-op Assessment: Report given to RN and Post -op Vital signs reviewed and stable  Post vital signs: Reviewed and stable  Last Vitals:  Vitals Value Taken Time  BP 94/53 05/27/20 1154  Temp    Pulse 81 05/27/20 1154  Resp 21 05/27/20 1154  SpO2 93 % 05/27/20 1154  Vitals shown include unvalidated device data.  Last Pain:  Vitals:   05/27/20 1048  TempSrc: Temporal  PainSc: 0-No pain         Complications: No complications documented.

## 2020-05-28 ENCOUNTER — Encounter (HOSPITAL_COMMUNITY): Payer: Self-pay | Admitting: Cardiovascular Disease

## 2020-05-31 ENCOUNTER — Encounter (HOSPITAL_COMMUNITY): Payer: Self-pay | Admitting: Cardiovascular Disease

## 2020-05-31 NOTE — Anesthesia Postprocedure Evaluation (Signed)
Anesthesia Post Note  Patient: Julia [name]  Procedure(s) Performed: TRANSESOPHAGEAL ECHOCARDIOGRAM (TEE) (N/A ) BUBBLE STUDY     Patient location during evaluation: Endoscopy Anesthesia Type: MAC Level of consciousness: awake and alert Pain management: pain level controlled Vital Signs Assessment: post-procedure vital signs reviewed and stable Respiratory status: spontaneous breathing, nonlabored ventilation, respiratory function stable and patient connected to nasal cannula oxygen Cardiovascular status: stable and blood pressure returned to baseline Postop Assessment: no apparent nausea or vomiting Anesthetic complications: no   No complications documented.  Last Vitals:  Vitals:   05/27/20 1154 05/27/20 1204  BP: (!) 94/53 106/69  Pulse: 80 71  Resp: (!) 23 19  Temp: 37 C   SpO2: 95% 99%    Last Pain:  Vitals:   05/28/20 1518  TempSrc:   PainSc: 0-No pain                 Nekeisha Aure

## 2020-05-31 NOTE — Anesthesia Preprocedure Evaluation (Signed)
Anesthesia Evaluation  Patient identified by MRN, date of birth, ID band Patient awake    Reviewed: Allergy & Precautions, NPO status , Patient's Chart, lab work & pertinent test results  Airway Mallampati: II  TM Distance: >3 FB Neck ROM: Full    Dental  (+) Dental Advisory Given   Pulmonary neg shortness of breath, neg sleep apnea, neg COPD, neg recent URI,  Covid-19 Nucleic Acid Test Results Lab Results      Component                Value               Date                      SARSCOV2NAA              NEGATIVE            05/25/2020              breath sounds clear to auscultation       Cardiovascular hypertension, Pt. on medications (-) angina(-) Past MI  Rhythm:Regular     Neuro/Psych PSYCHIATRIC DISORDERS Anxiety TIA   GI/Hepatic GERD  ,  Endo/Other  Hypothyroidism   Renal/GU      Musculoskeletal  (+) Arthritis ,   Abdominal   Peds  Hematology   Anesthesia Other Findings   Reproductive/Obstetrics                             Anesthesia Physical Anesthesia Plan  ASA: II  Anesthesia Plan: MAC   Post-op Pain Management:    Induction: Intravenous  PONV Risk Score and Plan: 2 and Propofol infusion and Treatment may vary due to age or medical condition  Airway Management Planned: Nasal Cannula  Additional Equipment: None  Intra-op Plan:   Post-operative Plan:   Informed Consent: I have reviewed the patients History and Physical, chart, labs and discussed the procedure including the risks, benefits and alternatives for the proposed anesthesia with the patient or authorized representative who has indicated his/her understanding and acceptance.     Dental advisory given  Plan Discussed with: CRNA and Surgeon  Anesthesia Plan Comments:         Anesthesia Quick Evaluation

## 2020-06-02 ENCOUNTER — Encounter: Payer: Self-pay | Admitting: Internal Medicine

## 2020-06-02 ENCOUNTER — Telehealth (INDEPENDENT_AMBULATORY_CARE_PROVIDER_SITE_OTHER): Payer: Medicare Other | Admitting: Internal Medicine

## 2020-06-02 DIAGNOSIS — J069 Acute upper respiratory infection, unspecified: Secondary | ICD-10-CM | POA: Diagnosis not present

## 2020-06-02 MED ORDER — HYDROCODONE-HOMATROPINE 5-1.5 MG/5ML PO SYRP
5.0000 mL | ORAL_SOLUTION | Freq: Three times a day (TID) | ORAL | 0 refills | Status: DC | PRN
Start: 2020-06-02 — End: 2020-07-09

## 2020-06-02 MED ORDER — DOXYCYCLINE HYCLATE 100 MG PO TABS: 100.0000 mg | ORAL_TABLET | Freq: Two times a day (BID) | ORAL | 0 refills | Status: AC

## 2020-06-02 NOTE — Progress Notes (Signed)
Virtual Visit via telephone note  I connected with Julia [name] on 06/02/20 at  3:45 PM EST by telephone and verified that I am speaking with the correct person using two identifiers.   I discussed the limitations of evaluation and management by telemedicine and the availability of in person appointments. The patient expressed understanding and agreed to proceed.  Present for the visit:  Myself, Dr [name], Julia [name].  The patient is currently at home and I am in the office.    No referring provider.    History of Present Illness: She is here for an acute visit for cold symptoms.   Her symptoms started 10 days ago  She is experiencing cough that is dry, mild headaches, sinus pain, decreased taste and decreased appetite.  She is more concerned because of the Rituxan and knowing that she is immunocompromised.  She did do a home test and another test for Covid and they were both negative.  She has tried taking covid test negative   Review of Systems  Constitutional: Negative for chills and fever.       Appetite dec  HENT: Positive for sinus pain. Negative for ear pain and sore throat.        Taste dec  Respiratory: Positive for cough. Negative for sputum production.   Neurological: Positive for headaches (mild). Negative for dizziness.        Social History   Socioeconomic History  . Marital status: Married    Spouse name: Not on file  . Number of children: 0  . Years of education: Not on file  . Highest education level: High school graduate  Occupational History  . 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 Smoking status: Never Smoker  . Smokeless tobacco: Never Used  Substance and Sexual Activity  . Alcohol use: Yes    Alcohol/week: 3.0 - 5.0 standard drinks    Types: 3 - 5 Glasses of wine per week    Comment: per week  . Drug use: Never  . Sexual activity: Not on file  Other Topics Concern  . Not on file  Social History Narrative    Retired 07/10/17 from [employer]   No regular exercise      Lives at home with husband   Caffeine: 2 glasses/day diet dr pepper   Social Determinants of Health   Financial Resource Strain:   . Difficulty of Paying Living Expenses: Not on file  Food Insecurity:   . Worried About Charity fundraiser in the Last Year: Not on file  . Ran Out of Food in the Last Year: Not on file  Transportation Needs:   . Lack of Transportation (Medical): Not on file  . Lack of Transportation (Non-Medical): Not on file  Physical Activity:   . Days of Exercise per Week: Not on file  . Minutes of Exercise per Session: Not on file  Stress:   . Feeling of Stress : Not on file  Social Connections:   . Frequency of Communication with Friends and Family: Not on file  . Frequency of Social Gatherings with Friends and Family: Not on file  . Attends Religious Services: Not on file  . Active Member of Clubs or Organizations: Not on file  . Attends Archivist Meetings: Not on file  . Marital Status: Not on file     Observations/Objective:    Assessment and Plan:  See Problem List for Assessment and Plan of  chronic medical problems.   Follow Up Instructions:    I discussed the assessment and treatment plan with the patient. The patient was provided an opportunity to ask questions and all were answered. The patient agreed with the plan and demonstrated an understanding of the instructions.   The patient was advised to call back or seek an in-person evaluation if the symptoms worsen or if the condition fails to improve as anticipated.  Time spent on telephone call: 8 minutes  [name], MD

## 2020-06-02 NOTE — Assessment & Plan Note (Signed)
Acute Sound somewhat viral in nature, but she is immunocompromised and for that reason I have a very low threshold for treating for possible bacterial infections She has already started doxycycline that she had leftover from a previous infection and has taken 3 days worth and she feels that has helped Continue doxycycline for an additional 7 days-total of 10 days Hycodan cough syrup sent to pharmacy Rest, fluids She will let me know if her symptoms do not continue to improve.  If they do worsen we recommend that she goes to urgent care, but I do not anticipate that happening

## 2020-06-03 ENCOUNTER — Encounter (HOSPITAL_COMMUNITY): Payer: Self-pay | Admitting: Nurse Practitioner

## 2020-06-03 ENCOUNTER — Ambulatory Visit (HOSPITAL_COMMUNITY)
Admission: RE | Admit: 2020-06-03 | Discharge: 2020-06-03 | Disposition: A | Discharge status: Home / Self Care | Payer: Medicare Other | Source: Ambulatory Visit | Attending: Nurse Practitioner | Admitting: Nurse Practitioner

## 2020-06-03 ENCOUNTER — Other Ambulatory Visit: Payer: Self-pay

## 2020-06-03 VITALS — BP 104/70 | HR 79 | Ht 64.0 in | Wt 182.8 lb

## 2020-06-03 DIAGNOSIS — Z8673 Personal history of transient ischemic attack (TIA), and cerebral infarction without residual deficits: Secondary | ICD-10-CM | POA: Diagnosis not present

## 2020-06-03 DIAGNOSIS — D6869 Other thrombophilia: Secondary | ICD-10-CM | POA: Diagnosis not present

## 2020-06-03 DIAGNOSIS — Z8249 Family history of ischemic heart disease and other diseases of the circulatory system: Secondary | ICD-10-CM | POA: Diagnosis not present

## 2020-06-03 DIAGNOSIS — Z7901 Long term (current) use of anticoagulants: Secondary | ICD-10-CM | POA: Diagnosis not present

## 2020-06-03 DIAGNOSIS — I10 Essential (primary) hypertension: Secondary | ICD-10-CM | POA: Diagnosis not present

## 2020-06-03 DIAGNOSIS — Z79890 Hormone replacement therapy (postmenopausal): Secondary | ICD-10-CM | POA: Diagnosis not present

## 2020-06-03 DIAGNOSIS — Q211 Atrial septal defect: Secondary | ICD-10-CM | POA: Insufficient documentation

## 2020-06-03 DIAGNOSIS — M069 Rheumatoid arthritis, unspecified: Secondary | ICD-10-CM | POA: Diagnosis not present

## 2020-06-03 DIAGNOSIS — I4891 Unspecified atrial fibrillation: Secondary | ICD-10-CM | POA: Insufficient documentation

## 2020-06-03 DIAGNOSIS — Z79899 Other long term (current) drug therapy: Secondary | ICD-10-CM | POA: Diagnosis not present

## 2020-06-03 DIAGNOSIS — M0579 Rheumatoid arthritis with rheumatoid factor of multiple sites without organ or systems involvement: Secondary | ICD-10-CM | POA: Diagnosis not present

## 2020-06-03 MED ORDER — DILTIAZEM HCL 30 MG PO TABS: ORAL_TABLET | ORAL | 1 refills | Status: DC

## 2020-06-03 NOTE — H&P (View-Only) (Signed)
Primary Care Physician: [name], MD Referring Physician:  [name]  Neurology: Dr. [name] Cardiology: Dr. [name] is a 69 y.o. female with a h/o HTN, TIA, small PFO, recently dx'ed, evaluated by Dr. [name], seen by Dr. [name] recently and a  monitor  was placed 2/2 to her symptoms . Afib was reported yesterday by monitor company with short episode  new  onset of afib with v rates of 110-130. She felt off but not that symptomatic with episode. She is now in the afib clinic for evaluation. She  is in SR. She will wear the monitor for another 10 days.   She  gives me the background on her strange intermittent  neurological symptoms for the last year and recently having a PFO found. She is having a TEE next week ordered by Dr. [name].   She also is very concerned regarding  being immunocomprised with her  RA. She has had the vaccines but was tested for antibodies and did not have any. Therefore, she has started going to a holistic group in [facility] and is on a whole list of supplements including Boswellia, andrograhis, Cat's Claw, 5 Mushroom Blend, Bio-Quercetin, and Liposomal Vit C as well as Ivermectin 5 tablets(3mg ) every week on Sunday.   We discussed afib stroke  risk with a CHA2DS2VASc score of 5. I would like her to start eliquis 5 mg bid to reduce her stroke risk but I am concerned to start with all the above supplements and possible interactions. I also am concerned that since she just started taking all the supplements one week ago  that there may be stimulants in them  that can trigger afib. I will have PharmD to run thru a data base to look for interactions.   F/u in afib clinic,11/24. She is now on DOAC and not having any  issues. She  has not had any further afib that she is aware of.  . Monitor has been turned in but not resulted in Epic yet.   Today, she denies symptoms of palpitations, chest pain, shortness of breath, orthopnea, PND, lower extremity  edema, dizziness, presyncope, syncope, or neurologic sequela. The patient is tolerating medications without difficulties and is otherwise without complaint today.   Past Medical History:  Diagnosis Date  . H/O breast augmentation   . History of gastric bypass   . Hypertension   . Pelvis fracture ([place])    in 30's  . Pneumonia   . RA (rheumatoid arthritis) ([place])    Past Surgical History:  Procedure Laterality Date  . APPENDECTOMY    . BREAST ENHANCEMENT SURGERY    . BUBBLE STUDY  05/27/2020   Procedure: BUBBLE STUDY;  Surgeon: [name], MD;  Location: [facility];  Service: Cardiovascular;;  . GASTRIC BYPASS    . TEE WITHOUT CARDIOVERSION N/A 05/27/2020   Procedure: TRANSESOPHAGEAL ECHOCARDIOGRAM (TEE);  Surgeon: [name], MD;  Location: [facility];  Service: Cardiovascular;  Laterality: N/A;  . TONSILLECTOMY      Current Outpatient Medications  Medication Sig Dispense Refill  . amLODipine (NORVASC) 2.5 MG tablet Take 1 tablet (2.5 mg total) by mouth daily. 90 tablet 3  . apixaban (ELIQUIS) 5 MG TABS tablet Take 1 tablet (5 mg total) by mouth 2 (two) times daily. 60 tablet 3  . Ascorbic Acid (VITAMIN C PO) Take 3,200 mg by mouth daily.     [name] (BOSWELLIA PO) Take 125 mg by  mouth 2 (two) times daily.    . calcium carbonate (TUMS - DOSED IN MG ELEMENTAL CALCIUM) 500 MG chewable tablet Chew 2 tablets by mouth daily as needed for indigestion or heartburn.    . Calcium Citrate-Vitamin D (CALCIUM + D PO) Take 1 capsule by mouth 2 (two) times daily.    . Cats Claw, Uncaria tomentosa, (CATS CLAW PO) Take 500 mg by mouth 2 (two) times daily.    . Cholecalciferol (VITAMIN D3) 25 MCG (1000 UT) CAPS Take 5,000 Units by mouth 2 (two) times daily.     . Coenzyme Q10 (COQ-10) 100 MG CAPS Take 200 mg by mouth daily.    Marland Kitchen doxycycline (VIBRA-TABS) 100 MG tablet Take 1 tablet (100 mg total) by mouth 2 (two) times daily for 7 days. 14 tablet 0  . guaiFENesin  (MUCINEX) 600 MG 12 hr tablet Take 600 mg by mouth daily.    Marland Kitchen HYDROcodone-homatropine (HYCODAN) 5-1.5 MG/5ML syrup Take 5 mLs by mouth every 8 (eight) hours as needed for cough. 120 mL 0  . ivermectin (STROMECTOL) 3 MG TABS tablet Take 15 mg by mouth every Sunday.    [name] Oil 1000 MG CAPS Take 1,000 mg by mouth daily.    Marland Kitchen levothyroxine (SYNTHROID) 25 MCG tablet TAKE 1 TABLET BY MOUTH DAILY (Patient taking differently: Take 25 mcg by mouth daily before breakfast. ) 90 tablet 0  . loratadine (CLARITIN) 10 MG tablet Take 10 mg by mouth daily.    . Misc Natural Products (CVS GLUCOS-CHONDROIT-MSM TS) TABS Take 2 tablets by mouth at bedtime.    . Misc Natural Products (ELDERBERRY ZINC/VIT C/IMMUNE MT) Use as directed 2 each in the mouth or throat daily. Elderberry 100 mg Zinc 8 mg Vitamin C 90 mg    . Multiple Vitamins-Minerals (CENTRUM SILVER PO) Take 1 tablet by mouth daily.     . Omega-3 Fatty Acids (FISH OIL) 1000 MG CAPS Take 1,000 mg by mouth daily.    Marland Kitchen orlistat (ALLI) 60 MG capsule Take 60 mg by mouth 3 (three) times daily with meals.     Marland Kitchen OVER THE COUNTER MEDICATION Take 1,040 mg by mouth daily. Liposomal otc supplement    . Probiotic Product (PROBIOTIC PO) Take 1 capsule by mouth daily.    Marland Kitchen pyridOXINE (VITAMIN B-6) 100 MG tablet Take 200 mg by mouth daily.    Marland Kitchen QUERCETIN PO Take 29 mg by mouth daily.    . riTUXimab (RITUXAN) 500 MG/50ML injection Inject 1,000 mg into the vein See admin instructions. 2 infusions two weeks apart every 6 months.    Marland Kitchen UNABLE TO FIND Take 400 mg by mouth 2 (two) times daily. andrographis otc supplement    . UNABLE TO FIND Take 2 capsules by mouth daily. Med Name: Monolaurin 936 mg and Zinc 15 mg    . valACYclovir (VALTREX) 500 MG tablet TAKE 1 TABLET(500 MG) BY MOUTH DAILY (Patient taking differently: Take 500 mg by mouth daily. ) 30 tablet 11  . venlafaxine XR (EFFEXOR-XR) 75 MG 24 hr capsule Take 75 mg by mouth daily.    Marland Kitchen zinc gluconate 50 MG tablet Take  50 mg by mouth every Monday, Wednesday, and Friday.    . diltiazem (CARDIZEM) 30 MG tablet Take 1 tablet every 4 hours AS NEEDED for heart rate >100 45 tablet 1   No current facility-administered medications for this encounter.    Allergies  Allergen Reactions  . Methotrexate Derivatives Itching    Social History  Socioeconomic History  . Marital status: Married    Spouse name: Not on file  . Number of children: 0  . Years of education: Not on file  . Highest education level: High school graduate  Occupational History  . 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 Smoking status: Never Smoker  . Smokeless tobacco: Never Used  Substance and Sexual Activity  . Alcohol use: Yes    Alcohol/week: 3.0 - 5.0 standard drinks    Types: 3 - 5 Glasses of wine per week    Comment: per week  . Drug use: Never  . Sexual activity: Not on file  Other Topics Concern  . Not on file  Social History Narrative   Retired 07/10/17 from [employer]   No regular exercise      Lives at home with husband   Caffeine: 2 glasses/day diet dr pepper   Social Determinants of Health   Financial Resource Strain:   . Difficulty of Paying Living Expenses: Not on file  Food Insecurity:   . Worried About Charity fundraiser in the Last Year: Not on file  . Ran Out of Food in the Last Year: Not on file  Transportation Needs:   . Lack of Transportation (Medical): Not on file  . Lack of Transportation (Non-Medical): Not on file  Physical Activity:   . Days of Exercise per Week: Not on file  . Minutes of Exercise per Session: Not on file  Stress:   . Feeling of Stress : Not on file  Social Connections:   . Frequency of Communication with Friends and Family: Not on file  . Frequency of Social Gatherings with Friends and Family: Not on file  . Attends Religious Services: Not on file  . Active Member of Clubs or Organizations: Not on file  . Attends Archivist Meetings: Not on  file  . Marital Status: Not on file  Intimate Partner Violence:   . Fear of Current or Ex-Partner: Not on file  . Emotionally Abused: Not on file  . Physically Abused: Not on file  . Sexually Abused: Not on file    Family History  Problem Relation Age of Onset  . Hypertension Mother   . Heart disease Mother   . Heart failure Mother   . Hypertension Father   . Ulcers Father   . Gallstones Father   . Kidney Stones Father   . Hypertension Sister   . Hypertension Brother   . Heart attack Maternal Grandmother     ROS- All systems are reviewed and negative except as per the HPI above  Physical Exam: Vitals:   06/03/20 1431  BP: 104/70  Pulse: 79  Weight: 82.9 kg  Height: 5\' 4"  (1.626 m)   Wt Readings from Last 3 Encounters:  06/03/20 82.9 kg  05/27/20 82.6 kg  05/20/20 84.5 kg    Labs: Lab Results  Component Value Date   NA 137 05/06/2020   K 3.8 05/06/2020   CL 101 05/06/2020   CO2 22 05/06/2020   GLUCOSE 92 05/06/2020   BUN 13 05/06/2020   CREATININE 0.57 05/06/2020   CALCIUM 9.3 05/06/2020   Lab Results  Component Value Date   INR 1.0 02/14/2020   Lab Results  Component Value Date   CHOL 169 09/02/2019   HDL 73.30 09/02/2019   LDLCALC 83 09/02/2019   TRIG 62.0 09/02/2019     GEN- The patient is well appearing, alert and  oriented x 3 today.   Head- normocephalic, atraumatic Eyes-  Sclera clear, conjunctiva pink Ears- hearing intact Oropharynx- clear Neck- supple, no JVP Lymph- no cervical lymphadenopathy Lungs- Clear to ausculation bilaterally, normal work of breathing Heart- Regular rate and rhythm, no murmurs, rubs or gallops, PMI not laterally displaced GI- soft, NT, ND, + BS Extremities- no clubbing, cyanosis, or edema MS- no significant deformity or atrophy Skin- no rash or lesion Psych- euthymic mood, full affect Neuro- strength and sensation are intact  EKG- NSR at 72 bpm, Pr int 178 ms, qrs int 82 ms, qtc 451 ms  Strips reviewed -  show afib with rvr Epic records reviewed  Echo bubble study 04/23/20- . Positive saline microcavitation study.  2. Left ventricular ejection fraction, by estimation, is 60 to 65%. The  left ventricle has normal function. The left ventricle has no regional  wall motion abnormalities. Left ventricular diastolic parameters were  normal.  3. Right ventricular systolic function is normal. The right ventricular  size is normal. Tricuspid regurgitation signal is inadequate for assessing  PA pressure.  4. The mitral valve is normal in structure. No evidence of mitral valve  regurgitation. No evidence of mitral stenosis.  5. The aortic valve is tricuspid. Aortic valve regurgitation is not  visualized. No aortic stenosis is present.  6. The inferior vena cava is normal in size with greater than 50%  respiratory variability, suggesting right atrial pressure of 3 mmHg.  7. Agitated saline contrast bubble study was positive with shunting  observed within 3-6 cardiac cycles suggestive of interatrial shunt.   Assessment and Plan: 1. New onset afib  In SR Results  of monitor are pending  General education re afib reviewed Triggers discussed   2. CHA2DS2VASc score of at least 5 Discussed stroke risk and risk benefit of anticoagulation  Continue eliquis 5 mg bid    3. PFO Still under evaluation  Per Dr. [name]    F/u in 2 -3 weeks with CBC/bmet  Hopefully will have full monitor to review at that time    Julia [name], [facility] [address] [place],  [address] [phone]

## 2020-06-03 NOTE — Progress Notes (Signed)
Primary Care Physician: [name], MD Referring Physician:  Clair [name]  Neurology: Dr. Lavell [name] Cardiology: Dr. [name] is a 69 y.o. female with a h/o HTN, TIA, small PFO, recently dx'ed, evaluated by Dr. Burt [name], seen by Dr. Jaynee [name] recently and a  monitor  was placed 2/2 to her symptoms . Afib was reported yesterday by monitor company with short episode  new  onset of afib with v rates of 110-130. She felt off but not that symptomatic with episode. She is now in the afib clinic for evaluation. She  is in SR. She will wear the monitor for another 10 days.   She  gives me the background on her strange intermittent  neurological symptoms for the last year and recently having a PFO found. She is having a TEE next week ordered by Dr. Burt [name].   She also is very concerned regarding  being immunocomprised with her  RA. She has had the vaccines but was tested for antibodies and did not have any. Therefore, she has started going to a holistic group in [facility] and is on a whole list of supplements including Boswellia, andrograhis, Cat's Claw, 5 Mushroom Blend, Bio-Quercetin, and Liposomal Vit C as well as Ivermectin 5 tablets(3mg ) every week on Sunday.   We discussed afib stroke  risk with a CHA2DS2VASc score of 5. I would like her to start eliquis 5 mg bid to reduce her stroke risk but I am concerned to start with all the above supplements and possible interactions. I also am concerned that since she just started taking all the supplements one week ago  that there may be stimulants in them  that can trigger afib. I will have PharmD to run thru a data base to look for interactions.   F/u in afib clinic,11/24. She is now on DOAC and not having any  issues. She  has not had any further afib that she is aware of.  . Monitor has been turned in but not resulted in Epic yet.   Today, she denies symptoms of palpitations, chest pain, shortness of breath, orthopnea, PND, lower extremity  edema, dizziness, presyncope, syncope, or neurologic sequela. The patient is tolerating medications without difficulties and is otherwise without complaint today.   Past Medical History:  Diagnosis Date  . H/O breast augmentation   . History of gastric bypass   . Hypertension   . Pelvis fracture ([place])    in 30's  . Pneumonia   . RA (rheumatoid arthritis) ([place])    Past Surgical History:  Procedure Laterality Date  . APPENDECTOMY    . BREAST ENHANCEMENT SURGERY    . BUBBLE STUDY  05/27/2020   Procedure: BUBBLE STUDY;  Surgeon: [name], MD;  Location: [facility];  Service: Cardiovascular;;  . GASTRIC BYPASS    . TEE WITHOUT CARDIOVERSION N/A 05/27/2020   Procedure: TRANSESOPHAGEAL ECHOCARDIOGRAM (TEE);  Surgeon: [name], MD;  Location: [facility];  Service: Cardiovascular;  Laterality: N/A;  . TONSILLECTOMY      Current Outpatient Medications  Medication Sig Dispense Refill  . amLODipine (NORVASC) 2.5 MG tablet Take 1 tablet (2.5 mg total) by mouth daily. 90 tablet 3  . apixaban (ELIQUIS) 5 MG TABS tablet Take 1 tablet (5 mg total) by mouth 2 (two) times daily. 60 tablet 3  . Ascorbic Acid (VITAMIN C PO) Take 3,200 mg by mouth daily.     [name] (BOSWELLIA PO) Take 125 mg by  mouth 2 (two) times daily.    . calcium carbonate (TUMS - DOSED IN MG ELEMENTAL CALCIUM) 500 MG chewable tablet Chew 2 tablets by mouth daily as needed for indigestion or heartburn.    . Calcium Citrate-Vitamin D (CALCIUM + D PO) Take 1 capsule by mouth 2 (two) times daily.    . Cats Claw, Uncaria tomentosa, (CATS CLAW PO) Take 500 mg by mouth 2 (two) times daily.    . Cholecalciferol (VITAMIN D3) 25 MCG (1000 UT) CAPS Take 5,000 Units by mouth 2 (two) times daily.     . Coenzyme Q10 (COQ-10) 100 MG CAPS Take 200 mg by mouth daily.    Marland Kitchen doxycycline (VIBRA-TABS) 100 MG tablet Take 1 tablet (100 mg total) by mouth 2 (two) times daily for 7 days. 14 tablet 0  . guaiFENesin  (MUCINEX) 600 MG 12 hr tablet Take 600 mg by mouth daily.    Marland Kitchen HYDROcodone-homatropine (HYCODAN) 5-1.5 MG/5ML syrup Take 5 mLs by mouth every 8 (eight) hours as needed for cough. 120 mL 0  . ivermectin (STROMECTOL) 3 MG TABS tablet Take 15 mg by mouth every Sunday.    [name] Oil 1000 MG CAPS Take 1,000 mg by mouth daily.    Marland Kitchen levothyroxine (SYNTHROID) 25 MCG tablet TAKE 1 TABLET BY MOUTH DAILY (Patient taking differently: Take 25 mcg by mouth daily before breakfast. ) 90 tablet 0  . loratadine (CLARITIN) 10 MG tablet Take 10 mg by mouth daily.    . Misc Natural Products (CVS GLUCOS-CHONDROIT-MSM TS) TABS Take 2 tablets by mouth at bedtime.    . Misc Natural Products (ELDERBERRY ZINC/VIT C/IMMUNE MT) Use as directed 2 each in the mouth or throat daily. Elderberry 100 mg Zinc 8 mg Vitamin C 90 mg    . Multiple Vitamins-Minerals (CENTRUM SILVER PO) Take 1 tablet by mouth daily.     . Omega-3 Fatty Acids (FISH OIL) 1000 MG CAPS Take 1,000 mg by mouth daily.    Marland Kitchen orlistat (ALLI) 60 MG capsule Take 60 mg by mouth 3 (three) times daily with meals.     Marland Kitchen OVER THE COUNTER MEDICATION Take 1,040 mg by mouth daily. Liposomal otc supplement    . Probiotic Product (PROBIOTIC PO) Take 1 capsule by mouth daily.    Marland Kitchen pyridOXINE (VITAMIN B-6) 100 MG tablet Take 200 mg by mouth daily.    Marland Kitchen QUERCETIN PO Take 29 mg by mouth daily.    . riTUXimab (RITUXAN) 500 MG/50ML injection Inject 1,000 mg into the vein See admin instructions. 2 infusions two weeks apart every 6 months.    Marland Kitchen UNABLE TO FIND Take 400 mg by mouth 2 (two) times daily. andrographis otc supplement    . UNABLE TO FIND Take 2 capsules by mouth daily. Med Name: Monolaurin 936 mg and Zinc 15 mg    . valACYclovir (VALTREX) 500 MG tablet TAKE 1 TABLET(500 MG) BY MOUTH DAILY (Patient taking differently: Take 500 mg by mouth daily. ) 30 tablet 11  . venlafaxine XR (EFFEXOR-XR) 75 MG 24 hr capsule Take 75 mg by mouth daily.    Marland Kitchen zinc gluconate 50 MG tablet Take  50 mg by mouth every Monday, Wednesday, and Friday.    . diltiazem (CARDIZEM) 30 MG tablet Take 1 tablet every 4 hours AS NEEDED for heart rate >100 45 tablet 1   No current facility-administered medications for this encounter.    Allergies  Allergen Reactions  . Methotrexate Derivatives Itching    Social History  Socioeconomic History  . Marital status: Married    Spouse name: Not on file  . Number of children: 0  . Years of education: Not on file  . Highest education level: High school graduate  Occupational History  . 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 Smoking status: Never Smoker  . Smokeless tobacco: Never Used  Substance and Sexual Activity  . Alcohol use: Yes    Alcohol/week: 3.0 - 5.0 standard drinks    Types: 3 - 5 Glasses of wine per week    Comment: per week  . Drug use: Never  . Sexual activity: Not on file  Other Topics Concern  . Not on file  Social History Narrative   Retired 07/10/17 from [employer]   No regular exercise      Lives at home with husband   Caffeine: 2 glasses/day diet dr pepper   Social Determinants of Health   Financial Resource Strain:   . Difficulty of Paying Living Expenses: Not on file  Food Insecurity:   . Worried About Charity fundraiser in the Last Year: Not on file  . Ran Out of Food in the Last Year: Not on file  Transportation Needs:   . Lack of Transportation (Medical): Not on file  . Lack of Transportation (Non-Medical): Not on file  Physical Activity:   . Days of Exercise per Week: Not on file  . Minutes of Exercise per Session: Not on file  Stress:   . Feeling of Stress : Not on file  Social Connections:   . Frequency of Communication with Friends and Family: Not on file  . Frequency of Social Gatherings with Friends and Family: Not on file  . Attends Religious Services: Not on file  . Active Member of Clubs or Organizations: Not on file  . Attends Archivist Meetings: Not on  file  . Marital Status: Not on file  Intimate Partner Violence:   . Fear of Current or Ex-Partner: Not on file  . Emotionally Abused: Not on file  . Physically Abused: Not on file  . Sexually Abused: Not on file    Family History  Problem Relation Age of Onset  . Hypertension Mother   . Heart disease Mother   . Heart failure Mother   . Hypertension Father   . Ulcers Father   . Gallstones Father   . Kidney Stones Father   . Hypertension Sister   . Hypertension Brother   . Heart attack Maternal Grandmother     ROS- All systems are reviewed and negative except as per the HPI above  Physical Exam: Vitals:   06/03/20 1431  BP: 104/70  Pulse: 79  Weight: 82.9 kg  Height: 5\' 4"  (1.626 m)   Wt Readings from Last 3 Encounters:  06/03/20 82.9 kg  05/27/20 82.6 kg  05/20/20 84.5 kg    Labs: Lab Results  Component Value Date   NA 137 05/06/2020   K 3.8 05/06/2020   CL 101 05/06/2020   CO2 22 05/06/2020   GLUCOSE 92 05/06/2020   BUN 13 05/06/2020   CREATININE 0.57 05/06/2020   CALCIUM 9.3 05/06/2020   Lab Results  Component Value Date   INR 1.0 02/14/2020   Lab Results  Component Value Date   CHOL 169 09/02/2019   HDL 73.30 09/02/2019   LDLCALC 83 09/02/2019   TRIG 62.0 09/02/2019     GEN- The patient is well appearing, alert and  oriented x 3 today.   Head- normocephalic, atraumatic Eyes-  Sclera clear, conjunctiva pink Ears- hearing intact Oropharynx- clear Neck- supple, no JVP Lymph- no cervical lymphadenopathy Lungs- Clear to ausculation bilaterally, normal work of breathing Heart- Regular rate and rhythm, no murmurs, rubs or gallops, PMI not laterally displaced GI- soft, NT, ND, + BS Extremities- no clubbing, cyanosis, or edema MS- no significant deformity or atrophy Skin- no rash or lesion Psych- euthymic mood, full affect Neuro- strength and sensation are intact  EKG- NSR at 72 bpm, Pr int 178 ms, qrs int 82 ms, qtc 451 ms  Strips reviewed -  show afib with rvr Epic records reviewed  Echo bubble study 04/23/20- . Positive saline microcavitation study.  2. Left ventricular ejection fraction, by estimation, is 60 to 65%. The  left ventricle has normal function. The left ventricle has no regional  wall motion abnormalities. Left ventricular diastolic parameters were  normal.  3. Right ventricular systolic function is normal. The right ventricular  size is normal. Tricuspid regurgitation signal is inadequate for assessing  PA pressure.  4. The mitral valve is normal in structure. No evidence of mitral valve  regurgitation. No evidence of mitral stenosis.  5. The aortic valve is tricuspid. Aortic valve regurgitation is not  visualized. No aortic stenosis is present.  6. The inferior vena cava is normal in size with greater than 50%  respiratory variability, suggesting right atrial pressure of 3 mmHg.  7. Agitated saline contrast bubble study was positive with shunting  observed within 3-6 cardiac cycles suggestive of interatrial shunt.   Assessment and Plan: 1. New onset afib  In SR Results  of monitor are pending  General education re afib reviewed Triggers discussed   2. CHA2DS2VASc score of at least 5 Discussed stroke risk and risk benefit of anticoagulation  Continue eliquis 5 mg bid    3. PFO Still under evaluation  Per Dr. Burt [name]    F/u in 2 -3 weeks with CBC/bmet  Hopefully will have full monitor to review at that time    Julia [name], [facility] [address] [place], [place] [address] [phone]

## 2020-06-03 NOTE — Patient Instructions (Signed)
Cardizem 30mg  -- Take 1 tablet every 4 hours AS NEEDED for heart rate >100 as long as blood pressure >100.

## 2020-06-12 ENCOUNTER — Other Ambulatory Visit: Payer: Self-pay | Admitting: Neurology

## 2020-06-12 ENCOUNTER — Telehealth: Payer: Self-pay | Admitting: Cardiovascular Disease

## 2020-06-12 DIAGNOSIS — R42 Dizziness and giddiness: Secondary | ICD-10-CM

## 2020-06-12 DIAGNOSIS — G459 Transient cerebral ischemic attack, unspecified: Secondary | ICD-10-CM

## 2020-06-12 DIAGNOSIS — I4891 Unspecified atrial fibrillation: Secondary | ICD-10-CM

## 2020-06-12 NOTE — Telephone Encounter (Signed)
° ° °  Pt would like to speak with Valetta Fuller, she said she was told she will receive a call today to let her know what she needs to stop taking before her procedure on 12/10. But until this hour no one called yet

## 2020-06-12 NOTE — Telephone Encounter (Signed)
I spoke with patient and went over medication instructions prior to PFO closure in 06/08/20 my chart message with her.  Patient aware these instructions can be viewed in my chart

## 2020-06-15 ENCOUNTER — Telehealth: Payer: Self-pay | Admitting: Cardiovascular Disease

## 2020-06-15 NOTE — Telephone Encounter (Signed)
Spoke to the patient in detail about her procedure again. She understands to start ASA 81 mg daily and to take including day of procedure. She understands to be there this Friday 12/10 at 0730.  Will send instructions via [facility] letter since the patient states this information is not in the MyChart message she received. She was grateful for assistance.

## 2020-06-15 NOTE — Telephone Encounter (Signed)
Pt c/o medication issue:  1. Name of Medication: ASPIRIN 81 mg     2. How are you currently taking this medication (dosage and times per day)? Starting aspirin today for PFO closure on 06/19/20  3. Are you having a reaction (difficulty breathing--STAT)? no  4. What is your medication issue? Patient wants to know if she is to hold this medication the night before or morning of her procedure like she is her eliquis. She states that she cannot see the mychart message with the instructions.   She is also requesting to have Katy call her because she still has some questions regarding her procedure.

## 2020-06-17 ENCOUNTER — Other Ambulatory Visit (HOSPITAL_COMMUNITY)
Admission: RE | Admit: 2020-06-17 | Discharge: 2020-06-17 | Disposition: A | Discharge status: Home / Self Care | Payer: Medicare Other | Source: Ambulatory Visit | Attending: Cardiovascular Disease | Admitting: Cardiovascular Disease

## 2020-06-17 ENCOUNTER — Other Ambulatory Visit: Payer: Self-pay

## 2020-06-17 ENCOUNTER — Other Ambulatory Visit: Payer: Medicare Other | Admitting: *Deleted

## 2020-06-17 DIAGNOSIS — Q211 Atrial septal defect: Secondary | ICD-10-CM | POA: Diagnosis not present

## 2020-06-17 DIAGNOSIS — Q2112 Patent foramen ovale: Secondary | ICD-10-CM

## 2020-06-17 DIAGNOSIS — Z01812 Encounter for preprocedural laboratory examination: Secondary | ICD-10-CM | POA: Diagnosis not present

## 2020-06-17 DIAGNOSIS — Z20822 Contact with and (suspected) exposure to covid-19: Secondary | ICD-10-CM | POA: Insufficient documentation

## 2020-06-17 LAB — CBC WITH DIFFERENTIAL/PLATELET
Basophils Absolute: 0.1 10*3/uL (ref 0.0–0.2)
Basos: 2 %
EOS (ABSOLUTE): 0.2 10*3/uL (ref 0.0–0.4)
Eos: 3 %
Hematocrit: 39.8 % (ref 34.0–46.6)
Hemoglobin: 13.6 g/dL (ref 11.1–15.9)
Immature Grans (Abs): 0 10*3/uL (ref 0.0–0.1)
Immature Granulocytes: 0 %
Lymphocytes Absolute: 1.2 10*3/uL (ref 0.7–3.1)
Lymphs: 18 %
MCH: 30.6 pg (ref 26.6–33.0)
MCHC: 34.2 g/dL (ref 31.5–35.7)
MCV: 90 fL (ref 79–97)
Monocytes Absolute: 1 10*3/uL — ABNORMAL HIGH (ref 0.1–0.9)
Monocytes: 15 %
Neutrophils Absolute: 4.3 10*3/uL (ref 1.4–7.0)
Neutrophils: 62 %
Platelets: 362 10*3/uL (ref 150–450)
RBC: 4.44 x10E6/uL (ref 3.77–5.28)
RDW: 12 % (ref 11.7–15.4)
WBC: 6.8 10*3/uL (ref 3.4–10.8)

## 2020-06-17 LAB — BASIC METABOLIC PANEL
BUN/Creatinine Ratio: 17 (ref 12–28)
BUN: 10 mg/dL (ref 8–27)
CO2: 22 mmol/L (ref 20–29)
Calcium: 9.4 mg/dL (ref 8.7–10.3)
Chloride: 102 mmol/L (ref 96–106)
Creatinine, Ser: 0.59 mg/dL (ref 0.57–1.00)
GFR calc Af Amer: 110 mL/min/{1.73_m2} (ref >59)
GFR calc non Af Amer: 95 mL/min/{1.73_m2} (ref >59)
Glucose: 93 mg/dL (ref 65–99)
Potassium: 4.2 mmol/L (ref 3.5–5.2)
Sodium: 140 mmol/L (ref 134–144)

## 2020-06-17 LAB — SARS CORONAVIRUS 2 (TAT 6-24 HRS): SARS Coronavirus 2: NEGATIVE

## 2020-06-18 ENCOUNTER — Telehealth: Payer: Self-pay | Admitting: *Deleted

## 2020-06-18 NOTE — Telephone Encounter (Signed)
Pt contacted pre-PFO scheduled at [facility] for: Friday June 19, 2020 9:30 AM Verified arrival time and place: [facility]) at: 7:30 AM   No solid food after midnight prior to cath, clear liquids until 5 AM day of procedure.  Hold: Eliquis-PM prior and AM of procedure only  Except hold medications AM meds can be  taken pre-cath with sips of water including: ASA 81 mg   Confirmed patient has responsible adult to drive home post procedure and be with patient first 24 hours after arriving home: yes  You are allowed ONE visitor in the waiting room during the time you are at the hospital for your procedure. Both you and your visitor must wear a mask once you enter the hospital.       COVID-19 Pre-Screening Questions:  . In the past 14 days have you had any symptoms concerning for COVID-19 infection (fever, chills, cough, or new shortness of breath)? no . In the past 14 days have you been around anyone with known Covid 19? no                 Reviewed procedure/mask/visitor instructions, COVID-19 questions reviewed with patient.

## 2020-06-19 ENCOUNTER — Ambulatory Visit (HOSPITAL_COMMUNITY)
Admission: RE | Disposition: A | Discharge status: Home / Self Care | Payer: Self-pay | Source: Home / Self Care | Attending: Cardiovascular Disease

## 2020-06-19 ENCOUNTER — Ambulatory Visit (HOSPITAL_BASED_OUTPATIENT_CLINIC_OR_DEPARTMENT_OTHER): Payer: Medicare Other

## 2020-06-19 ENCOUNTER — Ambulatory Visit (HOSPITAL_COMMUNITY)
Admission: RE | Admit: 2020-06-19 | Discharge: 2020-06-19 | Disposition: A | Discharge status: Home / Self Care | Payer: Medicare Other | Attending: Cardiovascular Disease | Admitting: Cardiovascular Disease

## 2020-06-19 ENCOUNTER — Other Ambulatory Visit: Payer: Self-pay

## 2020-06-19 DIAGNOSIS — Z79890 Hormone replacement therapy (postmenopausal): Secondary | ICD-10-CM | POA: Insufficient documentation

## 2020-06-19 DIAGNOSIS — Q211 Atrial septal defect: Secondary | ICD-10-CM

## 2020-06-19 DIAGNOSIS — Q2112 Patent foramen ovale: Secondary | ICD-10-CM

## 2020-06-19 DIAGNOSIS — Z79899 Other long term (current) drug therapy: Secondary | ICD-10-CM | POA: Diagnosis not present

## 2020-06-19 DIAGNOSIS — G459 Transient cerebral ischemic attack, unspecified: Secondary | ICD-10-CM | POA: Diagnosis not present

## 2020-06-19 DIAGNOSIS — I253 Aneurysm of heart: Secondary | ICD-10-CM

## 2020-06-19 DIAGNOSIS — I480 Paroxysmal atrial fibrillation: Secondary | ICD-10-CM | POA: Diagnosis not present

## 2020-06-19 DIAGNOSIS — Z7901 Long term (current) use of anticoagulants: Secondary | ICD-10-CM | POA: Insufficient documentation

## 2020-06-19 HISTORY — DX: Patent foramen ovale: Q21.12

## 2020-06-19 HISTORY — DX: Atrial septal defect: Q21.1

## 2020-06-19 HISTORY — PX: PATENT FORAMEN OVALE(PFO) CLOSURE: CATH118300

## 2020-06-19 HISTORY — PX: CARDIAC CATHETERIZATION: SHX172

## 2020-06-19 HISTORY — DX: Aneurysm of heart: I25.3

## 2020-06-19 LAB — ECHOCARDIOGRAM LIMITED
Height: 64 in
S' Lateral: 3.4 cm
Weight: 2768 oz

## 2020-06-19 LAB — POCT ACTIVATED CLOTTING TIME: Activated Clotting Time: 249 seconds

## 2020-06-19 SURGERY — PATENT FORAMEN OVALE (PFO) CLOSURE
Anesthesia: LOCAL

## 2020-06-19 MED ORDER — ONDANSETRON HCL 4 MG/2ML IJ SOLN: 4.0000 mg | Freq: Four times a day (QID) | INTRAMUSCULAR | Status: DC | PRN

## 2020-06-19 MED ORDER — LIDOCAINE HCL (PF) 1 % IJ SOLN
INTRAMUSCULAR | Status: AC
  Filled 2020-06-19: qty 30

## 2020-06-19 MED ORDER — HEPARIN (PORCINE) IN NACL 1000-0.9 UT/500ML-% IV SOLN
INTRAVENOUS | Status: DC | PRN
  Administered 2020-06-19 (×2): 500 mL

## 2020-06-19 MED ORDER — DIAZEPAM 5 MG PO TABS: 5.0000 mg | ORAL_TABLET | ORAL | Status: DC | PRN

## 2020-06-19 MED ORDER — OXYCODONE HCL 5 MG PO TABS: 5.0000 mg | ORAL_TABLET | ORAL | Status: DC | PRN

## 2020-06-19 MED ORDER — CEFAZOLIN SODIUM-DEXTROSE 2-4 GM/100ML-% IV SOLN
INTRAVENOUS | Status: AC
  Filled 2020-06-19: qty 100

## 2020-06-19 MED ORDER — HYDRALAZINE HCL 20 MG/ML IJ SOLN: 10.0000 mg | INTRAMUSCULAR | Status: DC | PRN

## 2020-06-19 MED ORDER — SODIUM CHLORIDE 0.9% FLUSH: 3.0000 mL | INTRAVENOUS | Status: DC | PRN

## 2020-06-19 MED ORDER — FENTANYL CITRATE (PF) 100 MCG/2ML IJ SOLN
INTRAMUSCULAR | Status: AC
  Filled 2020-06-19: qty 2

## 2020-06-19 MED ORDER — CEFAZOLIN SODIUM-DEXTROSE 2-4 GM/100ML-% IV SOLN: 2.0000 g | INTRAVENOUS | Status: DC

## 2020-06-19 MED ORDER — SODIUM CHLORIDE 0.9 % WEIGHT BASED INFUSION: 1.0000 mL/kg/h | INTRAVENOUS | Status: DC

## 2020-06-19 MED ORDER — ASPIRIN 81 MG PO CHEW: 81.0000 mg | CHEWABLE_TABLET | ORAL | Status: DC

## 2020-06-19 MED ORDER — SODIUM CHLORIDE 0.9 % IV SOLN: 250.0000 mL | INTRAVENOUS | Status: DC | PRN

## 2020-06-19 MED ORDER — LABETALOL HCL 5 MG/ML IV SOLN: 10.0000 mg | INTRAVENOUS | Status: DC | PRN

## 2020-06-19 MED ORDER — MIDAZOLAM HCL 2 MG/2ML IJ SOLN
INTRAMUSCULAR | Status: AC
  Filled 2020-06-19: qty 2

## 2020-06-19 MED ORDER — SODIUM CHLORIDE 0.9% FLUSH
3.0000 mL | INTRAVENOUS | Status: DC | PRN
Start: 2020-06-19 — End: 2020-06-19

## 2020-06-19 MED ORDER — SODIUM CHLORIDE 0.9 % WEIGHT BASED INFUSION
3.0000 mL/kg/h | INTRAVENOUS | Status: AC
  Administered 2020-06-19: 3 mL/kg/h via INTRAVENOUS

## 2020-06-19 MED ORDER — MIDAZOLAM HCL 2 MG/2ML IJ SOLN
INTRAMUSCULAR | Status: DC | PRN
  Administered 2020-06-19 (×3): 2 mg via INTRAVENOUS

## 2020-06-19 MED ORDER — LIDOCAINE HCL (PF) 1 % IJ SOLN
INTRAMUSCULAR | Status: DC | PRN
  Administered 2020-06-19: 15 mL

## 2020-06-19 MED ORDER — SODIUM CHLORIDE 0.9% FLUSH: 3.0000 mL | Freq: Two times a day (BID) | INTRAVENOUS | Status: DC

## 2020-06-19 MED ORDER — ACETAMINOPHEN 325 MG PO TABS: 650.0000 mg | ORAL_TABLET | ORAL | Status: DC | PRN

## 2020-06-19 MED ORDER — HEPARIN (PORCINE) IN NACL 1000-0.9 UT/500ML-% IV SOLN
INTRAVENOUS | Status: AC
  Filled 2020-06-19: qty 1000

## 2020-06-19 MED ORDER — HEPARIN SODIUM (PORCINE) 1000 UNIT/ML IJ SOLN
INTRAMUSCULAR | Status: DC | PRN
  Administered 2020-06-19: 6000 [IU] via INTRAVENOUS

## 2020-06-19 MED ORDER — FENTANYL CITRATE (PF) 100 MCG/2ML IJ SOLN
INTRAMUSCULAR | Status: DC | PRN
  Administered 2020-06-19: 50 ug via INTRAVENOUS
  Administered 2020-06-19 (×2): 25 ug via INTRAVENOUS

## 2020-06-19 MED ORDER — HEPARIN SODIUM (PORCINE) 1000 UNIT/ML IJ SOLN
INTRAMUSCULAR | Status: AC
  Filled 2020-06-19: qty 1

## 2020-06-19 SURGICAL SUPPLY — 17 items
CATH ACUNAV 8FR 90CM (CATHETERS) ×2 IMPLANT
CATH INFINITI 6F MPA2 100CM (CATHETERS) ×2 IMPLANT
CLOSURE PERCLOSE PROSTYLE (VASCULAR PRODUCTS) ×4 IMPLANT
COVER SWIFTLINK CONNECTOR (BAG) ×2 IMPLANT
GUIDEWIRE AMPLATZER 1.5JX260 (WIRE) ×2 IMPLANT
KIT MICROPUNCTURE NIT STIFF (SHEATH) ×2 IMPLANT
OCCLUDER PFO TALISMAN 25-18 (Prosthesis & Implant Heart) ×1 IMPLANT
PACK CARDIAC CATHETERIZATION (CUSTOM PROCEDURE TRAY) ×2 IMPLANT
PROTECTION STATION PRESSURIZED (MISCELLANEOUS) ×2
SHEATH DELIVERY TALISMAN 8F 80 (SHEATH) ×1 IMPLANT
SHEATH INTROD W/O MIN 9FR 25CM (SHEATH) ×2 IMPLANT
SHEATH PINNACLE 8F 10CM (SHEATH) ×2 IMPLANT
SHEATH PROBE COVER 6X72 (BAG) ×2 IMPLANT
STATION PROTECTION PRESSURIZED (MISCELLANEOUS) ×1 IMPLANT
TALISMAN DELIVERY SHEATH 8F 80 (SHEATH) ×2
TALISMAN PFO OCCLUDER 25-18 (Prosthesis & Implant Heart) ×2 IMPLANT
WIRE EMERALD 3MM-J .035X150CM (WIRE) ×2 IMPLANT

## 2020-06-19 NOTE — Progress Notes (Signed)
Client up and walked and tolerated well; right groin stable, no bleeding or hematoma 

## 2020-06-19 NOTE — Interval H&P Note (Signed)
History and Physical Interval Note:  06/19/2020 9:42 AM  Julia [name]  has presented today for surgery, with the diagnosis of TIA.  The various methods of treatment have been discussed with the patient and family. After consideration of risks, benefits and other options for treatment, the patient has consented to  Procedure(s): PATENT FORAMEN OVALE (PFO) CLOSURE (N/A) as a surgical intervention.  The patient's history has been reviewed, patient examined, no change in status, stable for surgery.  I have reviewed the patient's chart and labs.  Questions were answered to the patient's satisfaction.    Please see my initial consult note and multiple phone conversations documenting discussion with the patient as well as multidisciplinary team decision making with neurology.  The patient has been diagnosed with paroxysmal atrial fibrillation with a low atrial fib burden.  She strongly desires to undergo transcatheter PFO closure knowing that she has a moderate to large PFO with multiple TIAs documented.  She understands that she will require long-term oral anticoagulation regardless.  She has concerns about need to interrupt anticoagulation in the future and after discussion of all of the risks, potential benefit, and alternatives to transcatheter PFO closure, the patient would like to proceed.  [name]

## 2020-06-19 NOTE — Discharge Instructions (Signed)
You will require antibiotics prior to any dental work, including cleanings, for 6 months after your PFO/ASD closure. This is to protect the device from potentially getting infected from bacteria in your mouth entering your bloodstream. The medication will be called into your pharmacy on file. Please pick this up to have ready before any scheduled dental work. Instructions will be outlined on the bottle. The medication should be taken 1 hour prior to your dental appointment.   Groin Site Care Refer to this sheet in the next few weeks. These instructions provide you with information on caring for yourself after your procedure. Your caregiver may also give you more specific instructions. Your treatment has been planned according to current medical practices, but problems sometimes occur. Call your caregiver if you have any problems or questions after your procedure. HOME CARE INSTRUCTIONS  You may shower 24 hours after the procedure. Remove the bandage (dressing) and gently wash the site with plain soap and water. Gently pat the site dry.   Do not apply powder or lotion to the site.   Do not sit in a bathtub, swimming pool, or whirlpool for 5 to 7 days.   No bending, squatting, or lifting anything over 10 pounds (4.5 kg) for 1 week  Inspect the site at least twice daily.   Do not drive home if you are discharged the same day of the procedure. Have someone else drive you.   You may drive 24 hours after the procedure unless otherwise instructed by your caregiver.  What to expect:  Any bruising will usually fade within 1 to 2 weeks.   Blood that collects in the tissue (hematoma) may be painful to the touch. It should usually decrease in size and tenderness within 1 to 2 weeks.  SEEK IMMEDIATE MEDICAL CARE IF:  You have unusual pain at the groin site or down the affected leg.   You have redness, warmth, swelling, or pain at the groin site.   You have drainage (other than a small amount of  blood on the dressing).   You have chills.   You have a fever or persistent symptoms for more than 72 hours.   You have a fever and your symptoms suddenly get worse.   Your leg becomes pale, cool, tingly, or numb.   You have heavy bleeding from the site. Hold pressure on the site.

## 2020-06-19 NOTE — Progress Notes (Signed)
  Echocardiogram 2D Echocardiogram has been performed.  Geoffery Lyons Swaim 06/19/2020, 1:24 PM

## 2020-06-19 NOTE — Progress Notes (Signed)
Dr [name] in and ok to d/c home; right groin dressing with old pink drainage, dressing changed and client up to bathroom, no further drainage, no bleeding or hematoma

## 2020-06-19 NOTE — Interval H&P Note (Signed)
History and Physical Interval Note:  06/19/2020 9:41 AM  Kavitha Bonsall  has presented today for surgery, with the diagnosis of TIA.  The various methods of treatment have been discussed with the patient and family. After consideration of risks, benefits and other options for treatment, the patient has consented to  Procedure(s): PATENT FORAMEN OVALE (PFO) CLOSURE (N/A) as a surgical intervention.  The patient's history has been reviewed, patient examined, no change in status, stable for surgery.  I have reviewed the patient's chart and labs.  Questions were answered to the patient's satisfaction.     [name]

## 2020-06-22 ENCOUNTER — Other Ambulatory Visit (HOSPITAL_COMMUNITY): Payer: Self-pay | Admitting: *Deleted

## 2020-06-22 ENCOUNTER — Telehealth (HOSPITAL_COMMUNITY): Payer: Self-pay | Admitting: *Deleted

## 2020-06-22 ENCOUNTER — Encounter (HOSPITAL_COMMUNITY): Payer: Self-pay | Admitting: Cardiovascular Disease

## 2020-06-22 MED ORDER — APIXABAN 5 MG PO TABS
5.0000 mg | ORAL_TABLET | Freq: Two times a day (BID) | ORAL | 2 refills | Status: DC
Start: 2020-06-22 — End: 2021-03-01

## 2020-06-22 MED FILL — Cefazolin Sodium-Dextrose IV Solution 2 GM/100ML-4%: INTRAVENOUS | Qty: 100 | Status: AC

## 2020-06-22 NOTE — Telephone Encounter (Signed)
[id number]. ELIQUIS TAB 5MG  is approved through 07/10/2021

## 2020-06-23 ENCOUNTER — Other Ambulatory Visit (HOSPITAL_COMMUNITY): Payer: Self-pay

## 2020-06-23 MED ORDER — DILTIAZEM HCL 30 MG PO TABS: ORAL_TABLET | ORAL | 1 refills | Status: DC

## 2020-06-24 ENCOUNTER — Ambulatory Visit (HOSPITAL_COMMUNITY): Payer: Medicare Other | Admitting: Nurse Practitioner

## 2020-07-01 ENCOUNTER — Telehealth: Payer: Self-pay | Admitting: Internal Medicine

## 2020-07-01 NOTE — Progress Notes (Signed)
°  Chronic Care Management   Outreach Note  07/01/2020 Name: Julia [name] MRN: [id number] DOB: 21-Jul-1950  Referred by: [name], MD Reason for referral : No chief complaint on file.   An unsuccessful telephone outreach was attempted today. The patient was referred to the pharmacist for assistance with care management and care coordination.   Follow Up Plan:   Carley Perdue [employer] Scheduler

## 2020-07-07 ENCOUNTER — Ambulatory Visit (HOSPITAL_COMMUNITY): Payer: Medicare Other | Admitting: Nurse Practitioner

## 2020-07-09 ENCOUNTER — Ambulatory Visit (HOSPITAL_COMMUNITY)
Admission: RE | Admit: 2020-07-09 | Discharge: 2020-07-09 | Disposition: A | Discharge status: Home / Self Care | Payer: Medicare Other | Source: Ambulatory Visit | Attending: Nurse Practitioner | Admitting: Nurse Practitioner

## 2020-07-09 ENCOUNTER — Other Ambulatory Visit: Payer: Self-pay

## 2020-07-09 ENCOUNTER — Encounter (HOSPITAL_COMMUNITY): Payer: Self-pay | Admitting: Nurse Practitioner

## 2020-07-09 VITALS — BP 154/94 | HR 72 | Ht 64.0 in | Wt 177.6 lb

## 2020-07-09 DIAGNOSIS — Q211 Atrial septal defect: Secondary | ICD-10-CM | POA: Insufficient documentation

## 2020-07-09 DIAGNOSIS — Z8673 Personal history of transient ischemic attack (TIA), and cerebral infarction without residual deficits: Secondary | ICD-10-CM | POA: Insufficient documentation

## 2020-07-09 DIAGNOSIS — Z79899 Other long term (current) drug therapy: Secondary | ICD-10-CM | POA: Diagnosis not present

## 2020-07-09 DIAGNOSIS — I10 Essential (primary) hypertension: Secondary | ICD-10-CM | POA: Diagnosis not present

## 2020-07-09 DIAGNOSIS — I4891 Unspecified atrial fibrillation: Secondary | ICD-10-CM | POA: Diagnosis not present

## 2020-07-09 DIAGNOSIS — Z7901 Long term (current) use of anticoagulants: Secondary | ICD-10-CM | POA: Diagnosis not present

## 2020-07-09 DIAGNOSIS — Z7982 Long term (current) use of aspirin: Secondary | ICD-10-CM | POA: Diagnosis not present

## 2020-07-09 DIAGNOSIS — D6869 Other thrombophilia: Secondary | ICD-10-CM | POA: Diagnosis not present

## 2020-07-09 NOTE — Progress Notes (Signed)
Primary Care Physician: [name], MD Referring Physician:  [name]  Neurology: Dr. [name] Cardiology: Dr. [name] is a 69 y.o. female with a h/o HTN, TIA, small PFO, recently dx'ed, evaluated by Dr. [name], seen by Dr. [name] recently and a  monitor  was placed 2/2 to her symptoms . Afib was reported yesterday by monitor company with short episode  new  onset of afib with v rates of 110-130. She felt off but not that symptomatic with episode. She is now in the afib clinic for evaluation. She  is in SR. She will wear the monitor for another 10 days.   She  gives me the background on her strange intermittent  neurological symptoms for the last year and recently having a PFO found. She is having a TEE next week ordered by Dr. [name].   She also is very concerned regarding  being immunocomprised with her  RA. She has had the vaccines but was tested for antibodies and did not have any. Therefore, she has started going to a holistic group in [facility] and is on a whole list of supplements including Boswellia, andrograhis, Cat's Claw, 5 Mushroom Blend, Bio-Quercetin, and Liposomal Vit C as well as Ivermectin 5 tablets(3mg ) every week on Sunday.   We discussed afib stroke  risk with a CHA2DS2VASc score of 5. I would like her to start eliquis 5 mg bid to reduce her stroke risk but I am concerned to start with all the above supplements and possible interactions. I also am concerned that since she just started taking all the supplements one week ago  that there may be stimulants in them  that can trigger afib. I will have PharmD to run thru a data base to look for interactions.   F/u in afib clinic,11/24. She is now on DOAC and not having any  issues. She  has not had any further afib that she is aware of.  . Monitor has been turned in but not resulted in Epic yet.   F/u in afib clinic, 07/09/20. Since I saw pt she had PFO closure on 12/10 by Dr. [name]. Her event monitor also  resulted showing predominate rhythm was SR with less than 1 % afib burden with longest afib episode only 20 minutes. She has not noted any afib. Overall she feels well.   Today, she denies symptoms of palpitations, chest pain, shortness of breath, orthopnea, PND, lower extremity edema, dizziness, presyncope, syncope, or neurologic sequela. The patient is tolerating medications without difficulties and is otherwise without complaint today.   Past Medical History:  Diagnosis Date  . H/O breast augmentation   . History of gastric bypass   . Hypertension   . Pelvis fracture ([place])    in 30's  . Pneumonia   . RA (rheumatoid arthritis) ([place])    Past Surgical History:  Procedure Laterality Date  . APPENDECTOMY    . BREAST ENHANCEMENT SURGERY    . BUBBLE STUDY  05/27/2020   Procedure: BUBBLE STUDY;  Surgeon: [name], MD;  Location: [place];  Service: Cardiovascular;;  . GASTRIC BYPASS    . PATENT FORAMEN OVALE(PFO) CLOSURE N/A 06/19/2020   Procedure: PATENT FORAMEN OVALE (PFO) CLOSURE;  Surgeon: [name], MD;  Location: [place] CV [facility];  Service: Cardiovascular;  Laterality: N/A;  . TEE WITHOUT CARDIOVERSION N/A 05/27/2020   Procedure: TRANSESOPHAGEAL ECHOCARDIOGRAM (TEE);  Surgeon: [name], MD;  Location: [place];  Service: Cardiovascular;  Laterality: N/A;  . TONSILLECTOMY      Current Outpatient Medications  Medication Sig Dispense Refill  . amLODipine (NORVASC) 2.5 MG tablet Take 1 tablet (2.5 mg total) by mouth daily. 90 tablet 3  . apixaban (ELIQUIS) 5 MG TABS tablet Take 1 tablet (5 mg total) by mouth 2 (two) times daily. 180 tablet 2  . Ascorbic Acid (VITAMIN C PO) Take 3,200 mg by mouth daily. 1600 mg each    . aspirin EC 81 MG tablet Take 81 mg by mouth daily.    [name] (BOSWELLIA PO) Take 125 mg by mouth 2 (two) times daily.    . calcium carbonate (TUMS - DOSED IN MG ELEMENTAL CALCIUM) 500 MG chewable tablet Chew 2  tablets by mouth daily as needed for indigestion or heartburn.    . Calcium Citrate-Vitamin D (CALCIUM + D PO) Take 1 capsule by mouth 2 (two) times daily.    . Cats Claw, Uncaria tomentosa, (CATS CLAW PO) Take by mouth 2 (two) times daily.    . Cholecalciferol (VITAMIN D3) 250 MCG (10000 UT) TABS Take 5,000 Units by mouth 2 (two) times daily.     . Coenzyme Q10 (COQ-10) 100 MG CAPS Take 200 mg by mouth daily.    Marland Kitchen diltiazem (CARDIZEM) 30 MG tablet Take 1 tablet every 4 hours AS NEEDED for heart rate >100 45 tablet 1  . guaiFENesin (MUCINEX) 600 MG 12 hr tablet Take 600 mg by mouth daily.    Marland Kitchen HYDROcodone-homatropine (HYCODAN) 5-1.5 MG/5ML syrup Take 5 mLs by mouth every 8 (eight) hours as needed for cough. 120 mL 0  . ivermectin (STROMECTOL) 3 MG TABS tablet Take 15 mg by mouth every Sunday.    [name] Oil 1000 MG CAPS Take 1,000 mg by mouth daily.    Marland Kitchen levothyroxine (SYNTHROID) 25 MCG tablet TAKE 1 TABLET BY MOUTH DAILY (Patient taking differently: Take 25 mcg by mouth daily before breakfast.) 90 tablet 0  . loratadine (CLARITIN) 10 MG tablet Take 10 mg by mouth daily.    . Misc Natural Products ([employer] GLUCOS-CHONDROIT-MSM TS) TABS Take 2 tablets by mouth at bedtime.    . Misc Natural Products (ELDERBERRY ZINC/VIT C/IMMUNE MT) Use as directed 2 each in the mouth or throat daily. Elderberry 100 mg Zinc 8 mg Vitamin C 90 mg    . Multiple Vitamins-Minerals (CENTRUM SILVER PO) Take 1 tablet by mouth daily.     . Omega-3 Fatty Acids (FISH OIL) 1000 MG CAPS Take 1,000 mg by mouth daily.    Marland Kitchen orlistat (ALLI) 60 MG capsule Take 60 mg by mouth 3 (three) times daily with meals.     Marland Kitchen OVER THE COUNTER MEDICATION Take 1,040 mg by mouth daily. Liposomal otc supplement    . Probiotic Product (PROBIOTIC PO) Take 1 capsule by mouth daily.    . progesterone (PROMETRIUM) 100 MG capsule Take 100 mg by mouth at bedtime.    . pyridOXINE (VITAMIN B-6) 100 MG tablet Take 200 mg by mouth daily.    Marland Kitchen QUERCETIN PO Take  29 mg by mouth daily.    . riTUXimab (RITUXAN) 500 MG/50ML injection Inject 1,000 mg into the vein See admin instructions. 2 infusions two weeks apart every 6 months.    Marland Kitchen UNABLE TO FIND Take 400 mg by mouth 2 (two) times daily. andrographis otc supplement    . UNABLE TO FIND Take 2 capsules by mouth daily. Med Name: Monolaurin 936 mg and Zinc 15 mg    .  valACYclovir (VALTREX) 500 MG tablet TAKE 1 TABLET(500 MG) BY MOUTH DAILY (Patient taking differently: Take 500 mg by mouth daily.) 30 tablet 11  . venlafaxine XR (EFFEXOR-XR) 75 MG 24 hr capsule Take 75 mg by mouth daily.    Marland Kitchen zinc gluconate 50 MG tablet Take 50 mg by mouth every Monday, Wednesday, and Friday.     No current facility-administered medications for this encounter.    Allergies  Allergen Reactions  . Methotrexate Derivatives Itching    Social History   Socioeconomic History  . Marital status: Married    Spouse name: Not on file  . Number of children: 0  . Years of education: Not on file  . Highest education level: High school graduate  Occupational History  . 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 Smoking status: Never Smoker  . Smokeless tobacco: Never Used  Substance and Sexual Activity  . Alcohol use: Yes    Alcohol/week: 3.0 - 5.0 standard drinks    Types: 3 - 5 Glasses of wine per week    Comment: per week  . Drug use: Never  . Sexual activity: Not on file  Other Topics Concern  . Not on file  Social History Narrative   Retired 07/10/17 from [employer]   No regular exercise      Lives at home with husband   Caffeine: 2 glasses/day diet dr pepper   Social Determinants of Health   Financial Resource Strain: Not on file  Food Insecurity: Not on file  Transportation Needs: Not on file  Physical Activity: Not on file  Stress: Not on file  Social Connections: Not on file  Intimate Partner Violence: Not on file    Family History  Problem Relation Age of Onset  . Hypertension  Mother   . Heart disease Mother   . Heart failure Mother   . Hypertension Father   . Ulcers Father   . Gallstones Father   . Kidney Stones Father   . Hypertension Sister   . Hypertension Brother   . Heart attack Maternal Grandmother     ROS- All systems are reviewed and negative except as per the HPI above  Physical Exam: There were no vitals filed for this visit. Wt Readings from Last 3 Encounters:  06/19/20 78.5 kg  06/03/20 82.9 kg  05/27/20 82.6 kg    Labs: Lab Results  Component Value Date   NA 140 06/17/2020   K 4.2 06/17/2020   CL 102 06/17/2020   CO2 22 06/17/2020   GLUCOSE 93 06/17/2020   BUN 10 06/17/2020   CREATININE 0.59 06/17/2020   CALCIUM 9.4 06/17/2020   Lab Results  Component Value Date   INR 1.0 02/14/2020   Lab Results  Component Value Date   CHOL 169 09/02/2019   HDL 73.30 09/02/2019   LDLCALC 83 09/02/2019   TRIG 62.0 09/02/2019     GEN- The patient is well appearing, alert and oriented x 3 today.   Head- normocephalic, atraumatic Eyes-  Sclera clear, conjunctiva pink Ears- hearing intact Oropharynx- clear Neck- supple, no JVP Lymph- no cervical lymphadenopathy Lungs- Clear to ausculation bilaterally, normal work of breathing Heart- Regular rate and rhythm, no murmurs, rubs or gallops, PMI not laterally displaced GI- soft, NT, ND, + BS Extremities- no clubbing, cyanosis, or edema MS- no significant deformity or atrophy Skin- no rash or lesion Psych- euthymic mood, full affect Neuro- strength and sensation are intact  EKG-  Normal  sinus rhythm Cannot rule out Anterior infarct , age undetermined  Vent. rate 72 BPM PR interval 180 ms QRS duration 86 ms QT/QTc 410/448 ms  Cardiac Monitor 05/08/20  1. The basic rhythm is normal sinus with an average HR of 78 bpm 2. There are episodes of atrial fibrillation with RVR present, longest lasting 20 minutes, overall burden < 1% 3. No high-grade heart block or pathologic  pauses 4. There are rare PVC's      Epic records reviewed  Echo bubble study 04/23/20- . Positive saline microcavitation study.  2. Left ventricular ejection fraction, by estimation, is 60 to 65%. The  left ventricle has normal function. The left ventricle has no regional  wall motion abnormalities. Left ventricular diastolic parameters were  normal.  3. Right ventricular systolic function is normal. The right ventricular  size is normal. Tricuspid regurgitation signal is inadequate for assessing  PA pressure.  4. The mitral valve is normal in structure. No evidence of mitral valve  regurgitation. No evidence of mitral stenosis.  5. The aortic valve is tricuspid. Aortic valve regurgitation is not  visualized. No aortic stenosis is present.  6. The inferior vena cava is normal in size with greater than 50%  respiratory variability, suggesting right atrial pressure of 3 mmHg.  7. Agitated saline contrast bubble study was positive with shunting  observed within 3-6 cardiac cycles suggestive of interatrial shunt.   Assessment and Plan: 1. New onset afib  In SR Pt was unaware of afib episodes reported on event monitor  afib burden was very low, does not justify daily rte control meds    2. CHA2DS2VASc score of at least 5 Discussed stroke risk and risk benefit of anticoagulation  Continue eliquis 5 mg bid    3. PFO Recently closed by Dr. Excell Seltzer     labs reviewed form 12/8 F/u here in 3 months    Julia [name] [address] [place], [place] [address] [phone]

## 2020-07-13 ENCOUNTER — Other Ambulatory Visit: Payer: Self-pay

## 2020-07-13 ENCOUNTER — Telehealth: Payer: Self-pay | Admitting: Internal Medicine

## 2020-07-13 MED ORDER — LEVOTHYROXINE SODIUM 25 MCG PO TABS: 25.0000 ug | ORAL_TABLET | Freq: Every day | ORAL | 0 refills | Status: DC

## 2020-07-13 MED ORDER — VENLAFAXINE HCL ER 75 MG PO CP24: 75.0000 mg | ORAL_CAPSULE | Freq: Every day | ORAL | 1 refills | Status: DC

## 2020-07-13 MED ORDER — VALACYCLOVIR HCL 500 MG PO TABS: ORAL_TABLET | ORAL | 1 refills | Status: DC

## 2020-07-13 MED ORDER — AMLODIPINE BESYLATE 2.5 MG PO TABS: 2.5000 mg | ORAL_TABLET | Freq: Every day | ORAL | 3 refills | Status: DC

## 2020-07-13 NOTE — Telephone Encounter (Signed)
° °  Patient wants to know why "refill not appropriate" on her medications Please explain

## 2020-07-13 NOTE — Addendum Note (Signed)
Addended by: Pincus Sanes on: 07/13/2020 06:21 PM   Modules accepted: Orders

## 2020-07-22 NOTE — Progress Notes (Signed)
[facility] [place]                                     Cardiology Office Note:    Date:  07/23/2020   ID:  Julia [name], DOB February 27, 1951, MRN [id number]  PCP:  [name], MD  [facility] Cardiologist: Dr. Algernon [facility] Electrophysiologist:  None   Referring MD: [name], MD   1 month s/p PFO closure   History of Present Illness:    Julia [name] is a 70 y.o. female with a hx of RA on Rituxin, TIAs, and PFO s/p PFO closure (06/19/20) who presents to clinic for follow up.  Julia [name] was in her usual state of health until 02/2020 when she had sudden onset of severe dizziness and expressive aphasia that lasted about 6 hours. The following month she had another amnestic event. There was concern that she is having TIA's and an echo bubble study showed evidence of right-to-left shunt. The patient had evaluation with an MRI of the brain that showed no acute abnormality.  CTA showed no large vessel atherosclerosis or obstruction. She was referred to Dr. [name] for consideration of PFO closure. Subsequent cardiac monitor showed PAF and she was started on Eliquis. TEE 05/17/20 showed an aneurysmal interatrial septum and a moderate PFO. The patient had a strong desire for PFO closure despite being diagnosed with PAF and started on Padroni.   She underwent successful transcatheter PFO closure a 25 mm Amplatzer PFO occluder device on 06/19/20. Post op echo showed EF 60%, normally functioning PFO occluder with no residual shunting. She was resumed on Eliquis with the addition of a baby aspirin x 3 months.  Today she presents to clinic for follow up. No CP or SOB. No LE edema, orthopnea or PND. No dizziness or syncope. No blood in stool or urine. No palpitations. Does not want to follow with afib clinic. Has had more bruising with addition of aspirin.   Past Medical History:  Diagnosis Date  . H/O breast augmentation   . History of gastric bypass   .  Hypertension   . Pelvis fracture (Julia [name])    in 30's  . Pneumonia   . RA (rheumatoid arthritis) ([place])     Past Surgical History:  Procedure Laterality Date  . APPENDECTOMY    . BREAST ENHANCEMENT SURGERY    . BUBBLE STUDY  05/27/2020   Procedure: BUBBLE STUDY;  Surgeon: Julia Rile, MD;  Location: [place];  Service: Cardiovascular;;  . GASTRIC BYPASS    . PATENT FORAMEN OVALE(PFO) CLOSURE N/A 06/19/2020   Procedure: PATENT FORAMEN OVALE (PFO) CLOSURE;  Surgeon: Julia Mocha, MD;  Location: [place] [facility];  Service: Cardiovascular;  Laterality: N/A;  . TEE WITHOUT CARDIOVERSION N/A 05/27/2020   Procedure: TRANSESOPHAGEAL ECHOCARDIOGRAM (TEE);  Surgeon: Julia Rile, MD;  Location: [place];  Service: Cardiovascular;  Laterality: N/A;  . TONSILLECTOMY      Current Medications: Current Meds  Medication Sig  . amLODipine (NORVASC) 2.5 MG tablet Take 1 tablet (2.5 mg total) by mouth daily.  Marland Kitchen apixaban (ELIQUIS) 5 MG TABS tablet Take 1 tablet (5 mg total) by mouth 2 (two) times daily.  . Ascorbic Acid (VITAMIN C PO) Take 3,200 mg by mouth daily. 1600 mg each  . aspirin EC 81 MG tablet Take 81 mg by mouth daily.  . calcium  carbonate (TUMS - DOSED IN MG ELEMENTAL CALCIUM) 500 MG chewable tablet Chew 2 tablets by mouth daily as needed for indigestion or heartburn.  . Calcium Citrate-Vitamin D (CALCIUM + D PO) Take 1 capsule by mouth 2 (two) times daily.  . Cats Claw, Uncaria tomentosa, (CATS CLAW PO) Take by mouth 2 (two) times daily.  . Cholecalciferol (VITAMIN D3) 250 MCG (10000 UT) TABS Take 5,000 Units by mouth 2 (two) times daily.   . Coenzyme Q10 (COQ-10) 100 MG CAPS Take 200 mg by mouth daily.  Marland Kitchen diltiazem (CARDIZEM) 30 MG tablet Take 1 tablet every 4 hours AS NEEDED for heart rate >100  . guaiFENesin (MUCINEX) 600 MG 12 hr tablet Take 600 mg by mouth daily.  Marland Kitchen ivermectin (STROMECTOL) 3 MG TABS tablet Take 15 mg by mouth every Sunday.  [name] Oil  1000 MG CAPS Take 1,000 mg by mouth daily.  Marland Kitchen levothyroxine (SYNTHROID) 25 MCG tablet Take 1 tablet (25 mcg total) by mouth daily.  Marland Kitchen loratadine (CLARITIN) 10 MG tablet Take 10 mg by mouth daily.  . Misc Natural Products ([employer] GLUCOS-CHONDROIT-MSM TS) TABS Take 2 tablets by mouth at bedtime.  . Misc Natural Products (ELDERBERRY ZINC/VIT C/IMMUNE MT) Use as directed 2 each in the mouth or throat daily. Elderberry 100 mg Zinc 8 mg Vitamin C 90 mg  . Multiple Vitamins-Minerals (CENTRUM SILVER PO) Take 1 tablet by mouth daily.   . Omega-3 Fatty Acids (FISH OIL) 1000 MG CAPS Take 1,000 mg by mouth daily.  Marland Kitchen orlistat (ALLI) 60 MG capsule Take 60 mg by mouth 3 (three) times daily with meals.   Marland Kitchen OVER THE COUNTER MEDICATION Take 1,040 mg by mouth daily. Liposomal otc supplement  . Probiotic Product (PROBIOTIC PO) Take 1 capsule by mouth daily.  Marland Kitchen pyridOXINE (VITAMIN B-6) 100 MG tablet Take 200 mg by mouth daily.  Marland Kitchen QUERCETIN PO Take 29 mg by mouth daily.  . riTUXimab (RITUXAN) 500 MG/50ML injection Inject 1,000 mg into the vein See admin instructions. 2 infusions two weeks apart every 6 months.  Marland Kitchen UNABLE TO FIND Take 2 capsules by mouth daily. Med Name: Monolaurin 936 mg and Zinc 15 mg  . valACYclovir (VALTREX) 500 MG tablet TAKE 1 TABLET(500 MG) BY MOUTH DAILY  . venlafaxine XR (EFFEXOR-XR) 75 MG 24 hr capsule Take 1 capsule (75 mg total) by mouth daily.  Marland Kitchen zinc gluconate 50 MG tablet Take 50 mg by mouth every Monday, Wednesday, and Friday.     Allergies:   Methotrexate derivatives   Social History   Socioeconomic History  . Marital status: Married    Spouse name: Not on file  . Number of children: 0  . Years of education: Not on file  . Highest education level: High school graduate  Occupational History  . 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 Smoking status: Never Smoker  . Smokeless tobacco: Never Used  Substance and Sexual Activity  . Alcohol use: Yes     Alcohol/week: 3.0 - 5.0 standard drinks    Types: 3 - 5 Glasses of wine per week    Comment: per week  . Drug use: Never  . Sexual activity: Not on file  Other Topics Concern  . Not on file  Social History Narrative   Retired 07/10/17 from [employer]   No regular exercise      Lives at home with husband   Caffeine: 2 glasses/day diet dr pepper   Social  Determinants of Health   Financial Resource Strain: Not on file  Food Insecurity: Not on file  Transportation Needs: Not on file  Physical Activity: Not on file  Stress: Not on file  Social Connections: Not on file     Family History: The patient's family history includes Gallstones in her father; Heart attack in her maternal grandmother; Heart disease in her mother; Heart failure in her mother; Hypertension in her brother, father, mother, and sister; Kidney Stones in her father; Ulcers in her father.  ROS:   Please see the history of present illness.    All other systems reviewed and are negative.  EKGs/Labs/Other Studies Reviewed:    The following studies were reviewed today:  06/19/20 PATENT FORAMEN OVALE (PFO) CLOSURE  Conclusion Successful transcatheter PFO closure using intracardiac echo and fluoroscopic guidance with a 25 mm Amplatzer PFO occluder device  Recommend:  Resume apixaban tomorrow morning at normal dosing schedule, continue long-term in the setting of paroxysmal atrial fibrillation  Aspirin 81 mg daily x3 months  SBE prophylaxis x6 months when indicated  Same-day discharge after limited echo as long as no early complications arise  ___________________  06/19/20 IMPRESSIONS  1. Left ventricular ejection fraction, by estimation, is 60 to 65%. The  left ventricle has normal function.  2. PFO device intact. No residual shunt.  3. The mitral valve was not assessed. No evidence of mitral valve  regurgitation.  4. The aortic valve is normal in structure. Aortic valve regurgitation is  not visualized.    EKG:  EKG is NOT ordered today.  Recent Labs: 09/02/2019: TSH 1.45 02/14/2020: ALT 21 06/17/2020: BUN 10; Creatinine, Ser 0.59; Hemoglobin 13.6; Platelets 362; Potassium 4.2; Sodium 140  Recent Lipid Panel    Component Value Date/Time   CHOL 169 09/02/2019 0926   TRIG 62.0 09/02/2019 0926   HDL 73.30 09/02/2019 0926   CHOLHDL 2 09/02/2019 0926   VLDL 12.4 09/02/2019 0926   LDLCALC 83 09/02/2019 0926     Risk Assessment/Calculations:     CHA2DS2-VASc Score = 5  This indicates a 7.2% annual risk of stroke. The patient's score is based upon: CHF History: No HTN History: Yes Diabetes History: No Stroke History: Yes Vascular Disease History: No Age Score: 1 Gender Score: 1    Physical Exam:    VS:  BP 120/70 (BP Location: Left Arm, Patient Position: Sitting, Cuff Size: Normal)   Pulse 69   Ht 5\' 4"  (1.626 m)   Wt 179 lb (81.2 kg)   SpO2 95%   BMI 30.73 kg/m     Wt Readings from Last 3 Encounters:  07/23/20 179 lb (81.2 kg)  07/09/20 177 lb 9.6 oz (80.6 kg)  06/19/20 173 lb (78.5 kg)     GEN:  Well nourished, well developed in no acute distress HEENT: Normal NECK: No JVD; No carotid bruits LYMPHATICS: No lymphadenopathy CARDIAC: RRR, no murmurs, rubs, gallops RESPIRATORY:  Clear to auscultation without rales, wheezing or rhonchi  ABDOMEN: Soft, non-tender, non-distended MUSCULOSKELETAL:  No edema; No deformity  SKIN: Warm and dry NEUROLOGIC:  Alert and oriented x 3 PSYCHIATRIC:  Normal affect   ASSESSMENT:    1. S/P percutaneous patent foramen ovale closure   2. Primary hypertension   3. PAF (paroxysmal atrial fibrillation) (HCC)    PLAN:    In order of problems listed above:  PFO s/p percutaneous PFO closure: doing well. Groin site healing well. SBE prophylaxis x 6 months discussed. She will continue on Eliquis 5mg  BID  and aspirin 81 mg daily x 3 months. I will see her back in 1 year with an echo/bubble.   HTN: Bp well controlled. No changes made.    PAF: sounds regular on exam. Continue on Eliquis 5mg  bid. Does not want to follow with afib clinic anymore and requests her afib be followed by Dr. [name] and his team.    Medication Adjustments/Labs and Tests Ordered: Current medicines are reviewed at length with the patient today.  Concerns regarding medicines are outlined above.  No orders of the defined types were placed in this encounter.  No orders of the defined types were placed in this encounter.   Patient Instructions  Medication Instructions:  1) STOP ASPIRIN 3 months from procedure (09/17/2020) *If you need a refill on your cardiac medications before your next appointment, please call your pharmacy*  Follow-Up: At [facility], you and your health needs are our priority.  As part of our continuing mission to provide you with exceptional heart care, we have created designated Provider Care Teams.  These Care Teams include your primary Cardiologist (physician) and Advanced Practice Providers (APPs -  Physician Assistants and Nurse Practitioners) who all work together to provide you with the care you need, when you need it. Your next appointment:   6 month(s) The format for your next appointment:   In Person Provider:   Richardson Dopp, PA-C   We will call you to arrange your 1 year PFO closure echo and office visit with Nell Range, [place]!     Signed, Angelena Form, PA-C  07/23/2020 2:45 PM    [facility]

## 2020-07-23 ENCOUNTER — Other Ambulatory Visit: Payer: Self-pay

## 2020-07-23 ENCOUNTER — Ambulatory Visit (INDEPENDENT_AMBULATORY_CARE_PROVIDER_SITE_OTHER): Payer: Medicare Other | Admitting: Physician Assistant

## 2020-07-23 ENCOUNTER — Encounter: Payer: Self-pay | Admitting: Physician Assistant

## 2020-07-23 VITALS — BP 120/70 | HR 69 | Ht 64.0 in | Wt 179.0 lb

## 2020-07-23 DIAGNOSIS — I10 Essential (primary) hypertension: Secondary | ICD-10-CM

## 2020-07-23 DIAGNOSIS — Z8774 Personal history of (corrected) congenital malformations of heart and circulatory system: Secondary | ICD-10-CM | POA: Diagnosis not present

## 2020-07-23 DIAGNOSIS — I480 Paroxysmal atrial fibrillation: Secondary | ICD-10-CM

## 2020-07-23 NOTE — Patient Instructions (Signed)
Medication Instructions:  1) STOP ASPIRIN 3 months from procedure (09/17/2020) *If you need a refill on your cardiac medications before your next appointment, please call your pharmacy*  Follow-Up: At [facility], you and your health needs are our priority.  As part of our continuing mission to provide you with exceptional heart care, we have created designated Provider Care Teams.  These Care Teams include your primary Cardiologist (physician) and Advanced Practice Providers (APPs -  Physician Assistants and Nurse Practitioners) who all work together to provide you with the care you need, when you need it. Your next appointment:   6 month(s) The format for your next appointment:   In Person Provider:   Richardson Dopp, PA-C   We will call you to arrange your 1 year PFO closure echo and office visit with Nell Range, [place]!

## 2020-07-29 DIAGNOSIS — M180 Bilateral primary osteoarthritis of first carpometacarpal joints: Secondary | ICD-10-CM | POA: Diagnosis not present

## 2020-07-29 DIAGNOSIS — M0579 Rheumatoid arthritis with rheumatoid factor of multiple sites without organ or systems involvement: Secondary | ICD-10-CM | POA: Diagnosis not present

## 2020-07-29 DIAGNOSIS — Z79899 Other long term (current) drug therapy: Secondary | ICD-10-CM | POA: Diagnosis not present

## 2020-07-29 DIAGNOSIS — M818 Other osteoporosis without current pathological fracture: Secondary | ICD-10-CM | POA: Diagnosis not present

## 2020-07-29 DIAGNOSIS — Z5181 Encounter for therapeutic drug level monitoring: Secondary | ICD-10-CM | POA: Diagnosis not present

## 2020-07-31 ENCOUNTER — Telehealth: Payer: Self-pay | Admitting: Internal Medicine

## 2020-07-31 NOTE — Progress Notes (Signed)
  Chronic Care Management   Outreach Note  07/31/2020 Name: Julia [name] MRN: [id number] DOB: Oct 15, 1950  Referred by: [name], MD Reason for referral : No chief complaint on file.   A second unsuccessful telephone outreach was attempted today. The patient was referred to pharmacist for assistance with care management and care coordination.  Follow Up Plan:   Carley Perdue [employer] Scheduler

## 2020-08-02 ENCOUNTER — Other Ambulatory Visit: Payer: Self-pay | Admitting: Internal Medicine

## 2020-08-03 MED ORDER — LEVOTHYROXINE SODIUM 25 MCG PO TABS: 25.0000 ug | ORAL_TABLET | Freq: Every day | ORAL | 1 refills | Status: DC

## 2020-08-07 ENCOUNTER — Telehealth: Payer: Self-pay | Admitting: Internal Medicine

## 2020-08-07 NOTE — Progress Notes (Signed)
  Chronic Care Management   Outreach Note  08/07/2020 Name: Julia [name] MRN: [id number] DOB: 08-17-1950  Referred by: [name], MD Reason for referral : No chief complaint on file.   Third unsuccessful telephone outreach was attempted today. The patient was referred to the pharmacist for assistance with care management and care coordination.   Follow Up Plan:   Carley Perdue [employer] Scheduler

## 2020-08-25 ENCOUNTER — Encounter: Payer: Self-pay | Admitting: Adult Health

## 2020-08-25 ENCOUNTER — Ambulatory Visit (INDEPENDENT_AMBULATORY_CARE_PROVIDER_SITE_OTHER): Payer: Medicare Other | Admitting: Adult Health

## 2020-08-25 VITALS — BP 128/74 | HR 65 | Ht 63.0 in | Wt 173.0 lb

## 2020-08-25 DIAGNOSIS — G459 Transient cerebral ischemic attack, unspecified: Secondary | ICD-10-CM | POA: Diagnosis not present

## 2020-08-25 DIAGNOSIS — I480 Paroxysmal atrial fibrillation: Secondary | ICD-10-CM

## 2020-08-25 DIAGNOSIS — Q211 Atrial septal defect: Secondary | ICD-10-CM

## 2020-08-25 DIAGNOSIS — Q2112 Patent foramen ovale: Secondary | ICD-10-CM

## 2020-08-25 NOTE — Progress Notes (Addendum)
[facility]    Provider:  Dr [name] Requesting Provider: [name], MD Primary Care Provider:  [name], MD   CC:  TIA f/u   HPI:   Today, 08/25/2020, Julia [name] returns for 15-month follow-up regarding likely TIA.  Since prior visit, she completed further evaluation for likely multiple TIA events with 30-day cardiac event monitor finding atrial fibrillation and 2D echo showing evidence of PFO which was confirmed on TEE by Dr. [name].  She had appropriate follow-up with cardiology and has been started on Eliquis 5 mg twice daily as well as underwent successful PFO closure by Dr. [name] on 06/19/2020 with use of aspirin for 3 months.  She denies any additional stroke/TIA symptoms Reports compliance with Eliquis 5 mg twice daily and aspirin 81mg  daily tolerating without side effects Currently on omega-3 fatty acid for cholesterol management with prior LDL 83 (08/2019) -she plans on scheduling follow-up visit with PCP in the near future for repeat lipid panel Blood pressure today 128/74   No concerns at this time     Initial consult visit 04/22/2020 Dr. [name] (copy for reference purposes only)  Julia [name] is a 69 y.o. female here as requested by [name], MD for TIA versus BPPV vs complicated migraine(patient denies having a headache that day and NO history of migraines).  Patient was seen in the emergency room February 14, 2020, presenting for headache, photophobia, lightheadedness, "everything blinking", nausea vomiting, change speech pattern, code stroke was not called, she reported becoming acutely dizzy with nausea and vomiting that morning, also vision changes which she described as flashing blinking lights, she also reported a speech abnormality, code stroke was activated, initial CT and labs were negative, MRI was ordered with no acute abnormality, diagnosis was complex migraine versus peripheral vertigo.  She reported acute onset, there were no focal  deficits in strength in the arms and legs, neurology was consulted, imaging was unremarkable, patient symptoms improved she was able to ambulate and she was discharged home.   Patient is here and states that she had a debilitating episode, lasted for many hours, she went to the emergency room, included dizziness, vomiting, slurred abnormal speech, double vision, also 2 other episodes of bouts of amnesia 24 hours each time. She does not have a history of migraines. This was something new. She could not open her eyes due to vomiting, every time she opened her eyes she vomited, Slowly became worse, she was terrified, she thought she was dieing, they called emergency, she doesn't have much recollection of being in the ambulance. No headache. Started with dizziness and had chest heaviness, the dizziness and vertigo were non stop, not associated with movement, she denies any drug use or alcohol use. She has never had a migraine, she has no significant history of headaches or migraines. She denies any inciting event, no illnesses. She has also had Transient Global Amnesia twice.   Reviewed notes, labs and imaging from outside physicians, which showed:  I reviewed MRI of the brain images February 14, 2020: Which appeared normal.  I reviewed CTA of the head and neck reports February 14, 2020: Which were also normal and negative for large vessel occlusion or significant atherosclerotic disease.  February 14, 2020: Rapid drug screen showed benzodiazepines and THC.  CMP showed elevated glucose otherwise normal.  Review of Systems: Patient complains of symptoms per HPI. Pertinent negatives and positives per HPI. All others negative.   Social History   Socioeconomic History  .  Marital status: Married    Spouse name: Not on file  . Number of children: 0  . Years of education: Not on file  . Highest education level: High school graduate  Occupational History  . 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 Smoking status: Never Smoker  . Smokeless tobacco: Never Used  Substance and Sexual Activity  . Alcohol use: Yes    Alcohol/week: 3.0 - 5.0 standard drinks    Types: 3 - 5 Glasses of wine per week    Comment: per week  . Drug use: Never  . Sexual activity: Not on file  Other Topics Concern  . Not on file  Social History Narrative   Retired 07/10/17 from [employer]   No regular exercise      Lives at home with husband   Caffeine: 2 glasses/day diet dr pepper   Social Determinants of Health   Financial Resource Strain: Not on file  Food Insecurity: Not on file  Transportation Needs: Not on file  Physical Activity: Not on file  Stress: Not on file  Social Connections: Not on file  Intimate Partner Violence: Not on file    Family History  Problem Relation Age of Onset  . Hypertension Mother   . Heart disease Mother   . Heart failure Mother   . Hypertension Father   . Ulcers Father   . Gallstones Father   . Kidney Stones Father   . Hypertension Sister   . Hypertension Brother   . Heart attack Maternal Grandmother     Past Medical History:  Diagnosis Date  . H/O breast augmentation   . History of gastric bypass   . Hypertension   . Pelvis fracture ([place])    in 30's  . Pneumonia   . RA (rheumatoid arthritis) [facility])     Patient Active Problem List   Diagnosis Date Noted  . PFO with atrial septal aneurysm 06/19/2020  . URI (upper respiratory infection) 06/02/2020  . TIA (transient ischemic attack) 04/22/2020  . Dizziness 02/24/2020  . Plant dermatitis 01/08/2020  . Anxiety 01/08/2020  . Ventral hernia without obstruction or gangrene 11/01/2019  . Right flank pain 10/29/2019  . Hypertension 08/30/2019  . Amnesia memory loss 04/08/2019  . Atypical chest pain 07/20/2018  . Chest tightness 07/17/2018  . Vitamin D deficiency 06/22/2017  . Hypothyroidism 01/14/2017  . Altered sensation, foot 01/14/2017  . History of tuberculosis exposure 01/14/2017  .  Osteoporosis 01/14/2017  . History of gastric bypass 01/14/2017  . History of colon polyps 01/14/2017  . Rheumatoid arthritis ([place]) 01/13/2017  . GERD (gastroesophageal reflux disease) 01/13/2017    Past Surgical History:  Procedure Laterality Date  . APPENDECTOMY    . BREAST ENHANCEMENT SURGERY    . BUBBLE STUDY  05/27/2020   Procedure: BUBBLE STUDY;  Surgeon: [name], MD;  Location: [place];  Service: Cardiovascular;;  . GASTRIC BYPASS    . PATENT FORAMEN OVALE(PFO) CLOSURE N/A 06/19/2020   Procedure: PATENT FORAMEN OVALE (PFO) CLOSURE;  Surgeon: [name], MD;  Location: [place] [facility];  Service: Cardiovascular;  Laterality: N/A;  . TEE WITHOUT CARDIOVERSION N/A 05/27/2020   Procedure: TRANSESOPHAGEAL ECHOCARDIOGRAM (TEE);  Surgeon: [name], MD;  Location: [place];  Service: Cardiovascular;  Laterality: N/A;  . TONSILLECTOMY      Current Outpatient Medications  Medication Sig Dispense Refill  . amLODipine (NORVASC) 2.5 MG tablet Take 1 tablet (2.5 mg total) by mouth  daily. 90 tablet 3  . apixaban (ELIQUIS) 5 MG TABS tablet Take 1 tablet (5 mg total) by mouth 2 (two) times daily. 180 tablet 2  . Ascorbic Acid (VITAMIN C PO) Take 3,200 mg by mouth daily. 1600 mg each    . aspirin EC 81 MG tablet Take 81 mg by mouth daily.    . calcium carbonate (TUMS - DOSED IN MG ELEMENTAL CALCIUM) 500 MG chewable tablet Chew 2 tablets by mouth daily as needed for indigestion or heartburn.    . Calcium Citrate-Vitamin D (CALCIUM + D PO) Take 1 capsule by mouth 2 (two) times daily.    . Cats Claw, Uncaria tomentosa, (CATS CLAW PO) Take by mouth 2 (two) times daily.    . Cholecalciferol (VITAMIN D3) 250 MCG (10000 UT) TABS Take 5,000 Units by mouth 2 (two) times daily.     . Coenzyme Q10 (COQ-10) 100 MG CAPS Take 200 mg by mouth daily.    Marland Kitchen diltiazem (CARDIZEM) 30 MG tablet Take 1 tablet every 4 hours AS NEEDED for heart rate >100 45 tablet 1  .  guaiFENesin (MUCINEX) 600 MG 12 hr tablet Take 600 mg by mouth daily.    Marland Kitchen ivermectin (STROMECTOL) 3 MG TABS tablet Take 15 mg by mouth every Sunday.    [name] Oil 1000 MG CAPS Take 1,000 mg by mouth daily.    Marland Kitchen levothyroxine (SYNTHROID) 25 MCG tablet Take 1 tablet (25 mcg total) by mouth daily. 90 tablet 1  . loratadine (CLARITIN) 10 MG tablet Take 10 mg by mouth daily.    . Misc Natural Products (CVS GLUCOS-CHONDROIT-MSM TS) TABS Take 2 tablets by mouth at bedtime.    . Misc Natural Products (ELDERBERRY ZINC/VIT C/IMMUNE MT) Use as directed 2 each in the mouth or throat daily. Elderberry 100 mg Zinc 8 mg Vitamin C 90 mg    . Multiple Vitamins-Minerals (CENTRUM SILVER PO) Take 1 tablet by mouth daily.     . Omega-3 Fatty Acids (FISH OIL) 1000 MG CAPS Take 1,000 mg by mouth daily.    Marland Kitchen orlistat (ALLI) 60 MG capsule Take 60 mg by mouth 3 (three) times daily with meals.     Marland Kitchen OVER THE COUNTER MEDICATION Take 1,040 mg by mouth daily. Liposomal otc supplement    . Probiotic Product (PROBIOTIC PO) Take 1 capsule by mouth daily.    Marland Kitchen pyridOXINE (VITAMIN B-6) 100 MG tablet Take 200 mg by mouth daily.    Marland Kitchen QUERCETIN PO Take 29 mg by mouth daily.    . riTUXimab (RITUXAN) 500 MG/50ML injection Inject 1,000 mg into the vein See admin instructions. 2 infusions two weeks apart every 6 months.    Marland Kitchen UNABLE TO FIND Take 2 capsules by mouth daily. Med Name: Monolaurin 936 mg and Zinc 15 mg    . valACYclovir (VALTREX) 500 MG tablet TAKE 1 TABLET(500 MG) BY MOUTH DAILY 90 tablet 1  . venlafaxine XR (EFFEXOR-XR) 75 MG 24 hr capsule Take 1 capsule (75 mg total) by mouth daily. 90 capsule 1  . zinc gluconate 50 MG tablet Take 50 mg by mouth every Monday, Wednesday, and Friday.     No current facility-administered medications for this visit.    Allergies as of 08/25/2020 - Review Complete 08/25/2020  Allergen Reaction Noted  . Methotrexate derivatives Itching 04/22/2020    Vitals: BP 128/74   Pulse 65   Ht  5\' 3"  (1.6 m)   Wt 173 lb (78.5 kg)   BMI 30.65  kg/m  Last Weight:  Wt Readings from Last 1 Encounters:  08/25/20 173 lb (78.5 kg)   Last Height:   Ht Readings from Last 1 Encounters:  08/25/20 5\' 3"  (1.6 m)     Physical exam: Exam: Gen: NAD, very pleasant conversant, well nourised, obese, well groomed                     CV: RRR, no MRG. No Carotid Bruits. No peripheral edema, warm, nontender Eyes: Conjunctivae clear without exudates or hemorrhage  Neuro: Detailed Neurologic Exam  Speech:    Speech is normal; fluent and spontaneous with normal comprehension.  Cognition:    The patient is oriented to person, place, and time;     recent and remote memory intact;     language fluent;     normal attention, concentration,     fund of knowledge Cranial Nerves:    The pupils are equal, round, and reactive to light. Visual fields are full to finger confrontation. Extraocular movements are intact. Trigeminal sensation is intact and the muscles of mastication are normal. The face is symmetric. The palate elevates in the midline. Hearing intact. Voice is normal. Shoulder shrug is normal. The tongue has normal motion without fasciculations.   Coordination:    No dysmetria or ataxia  Gait:    Normal native gait  Motor Observation:    No asymmetry, no atrophy, and no involuntary movements noted. Tone:    Normal muscle tone.    Posture:    Posture is normal. normal erect    Strength:    Strength is V/V in the upper and lower limbs.      Sensation: intact to LT     Reflex Exam:  DTR's:    Deep tendon reflexes in the upper and lower extremities are normal bilaterally.   Toes:    The toes are downgoing bilaterally.   Clonus:    Clonus is absent.    Assessment/Plan:  Patient presented to the ED on 02/14/2020.  With severe nausea, vomiting, non-positional vertigo, acute severe onset, dysarthria concerning for posterior circulation TIA. She has has additionally 2 episodes of  Transient Global amnesia which is very unusual.  She denies having a headache during the episode and she has never had migraines or significant headaches so I don't think this was a "complicated" migraine, doesn't sound liker BPPV.  Further stroke work-up revealed evidence of atrial fibrillation and PFO   TIA No additional TIA/stroke symptoms Continue Eliquis 5 mg twice daily and omega-3 for secondary stroke prevention Discussed secondary stroke prevention measures and importance of close PCP follow-up for aggressive stroke risk factor management including HTN with BP goal<130/90 and HLD with LDL goal<70 (prior LDL 83 08/2019)  A. Fib, New dx:  Found on cardiac monitor 05/2020.   CHA2DS2-VASc score of 5 therefore cardiology Eliquis 5 mg twice daily initiated for secondary stroke prevention   PFO S/p closure 08/21/2019 by Dr. [name] Continue aspirin 81 mg daily until 3/10 per cards (3 month post closure)    Follow-up in 1 year or call earlier if needed    CC:  [facility] provider: Dr. Arita Miss, Claudina Lick, MD   I spent 30 minutes of face-to-face and non-face-to-face time with patient.  This included previsit chart review, lab review, study review, order entry, electronic health record documentation, patient education and discussion regarding prior episodes likely TIA, new diagnosis of A. fib and PFO s/p closure, routine follow-up with PCP for aggressive stroke  risk factor management and answered all other questions to patient satisfaction  [name], [facility]  [facility] [address] [place], [place] [address]  Phone [phone] Fax [phone] Note: This document was prepared with digital dictation and possible smart phrase technology. Any transcriptional errors that result from this process are unintentional.  Made any corrections needed, and agree with history, physical, neuro exam,assessment and plan as stated.     Sarina Ill, MD [facility]

## 2020-08-25 NOTE — Patient Instructions (Signed)
Your Plan:  Continue current treatment regimen and continued follow-up with cardiology and PCP    Follow-up in 1 year or call earlier if needed     Thank you for coming to see [place] at [facility]. I hope we have been able to provide you high quality care today.  You may receive a patient satisfaction survey over the next few weeks. We would appreciate your feedback and comments so that we may continue to improve ourselves and the health of our patients.

## 2020-08-31 DIAGNOSIS — M0609 Rheumatoid arthritis without rheumatoid factor, multiple sites: Secondary | ICD-10-CM | POA: Diagnosis not present

## 2020-09-28 ENCOUNTER — Encounter: Payer: Self-pay | Admitting: Internal Medicine

## 2020-09-28 DIAGNOSIS — R739 Hyperglycemia, unspecified: Secondary | ICD-10-CM | POA: Insufficient documentation

## 2020-09-28 HISTORY — DX: Hyperglycemia, unspecified: R73.9

## 2020-09-28 NOTE — Progress Notes (Signed)
Subjective:    Patient ID: Julia [name], female    DOB: [birth year], 70 y.o.   MRN: [id number]   This visit occurred during the SARS-CoV-2 public health emergency.  Safety protocols were in place, including screening questions prior to the visit, additional usage of staff PPE, and extensive cleaning of exam room while observing appropriate contact time as indicated for disinfecting solutions.    HPI She is here for follow up of her chronic medical conditions.    She is exercising some.  She has lost a little bit of weight.  Since she was here last her husband has left her and they are in the midst of getting divorced.  She is a lot to get settled around the house.  She has noticed some memory issues, which concerns her.  She does state some depression at times and some anxiety.  Medications and allergies reviewed with patient and updated if appropriate.  Patient Active Problem List   Diagnosis Date Noted  . Hyperglycemia 09/28/2020  . PFO with atrial septal aneurysm 06/19/2020  . TIA (transient ischemic attack) 04/22/2020  . Dizziness 02/24/2020  . Plant dermatitis 01/08/2020  . Anxiety 01/08/2020  . Ventral hernia without obstruction or gangrene 11/01/2019  . Right flank pain 10/29/2019  . Hypertension 08/30/2019  . Amnesia memory loss 04/08/2019  . Atypical chest pain 07/20/2018  . Chest tightness 07/17/2018  . Vitamin D deficiency 06/22/2017  . Hypothyroidism 01/14/2017  . Altered sensation, foot 01/14/2017  . History of tuberculosis exposure 01/14/2017  . Osteoporosis 01/14/2017  . History of gastric bypass 01/14/2017  . History of colon polyps 01/14/2017  . Rheumatoid arthritis ([place]) 01/13/2017  . GERD (gastroesophageal reflux disease) 01/13/2017    Current Outpatient Medications on File Prior to Visit  Medication Sig Dispense Refill  . amLODipine (NORVASC) 2.5 MG tablet Take 1 tablet (2.5 mg total) by mouth daily. 90 tablet 3  . apixaban (ELIQUIS) 5 MG TABS  tablet Take 1 tablet (5 mg total) by mouth 2 (two) times daily. 180 tablet 2  . Ascorbic Acid (VITAMIN C PO) Take 3,200 mg by mouth daily. 1600 mg each    . calcium carbonate (OS-CAL) 1250 (500 Ca) MG chewable tablet Chew by mouth.    . calcium carbonate (TUMS - DOSED IN MG ELEMENTAL CALCIUM) 500 MG chewable tablet Chew 2 tablets by mouth daily as needed for indigestion or heartburn.    . Calcium Citrate-Vitamin D (CALCIUM + D PO) Take 1 capsule by mouth 2 (two) times daily.    . Cholecalciferol (VITAMIN D3) 250 MCG (10000 UT) TABS Take 5,000 Units by mouth 2 (two) times daily.     . Coenzyme Q10 (COQ-10) 100 MG CAPS Take 200 mg by mouth daily.    Marland Kitchen diltiazem (CARDIZEM) 30 MG tablet Take 1 tablet every 4 hours AS NEEDED for heart rate >100 45 tablet 1  . guaiFENesin (MUCINEX) 600 MG 12 hr tablet Take 600 mg by mouth daily.    Marland Kitchen ivermectin (STROMECTOL) 3 MG TABS tablet Take 15 mg by mouth every Sunday.    [name] Oil 1000 MG CAPS Take 1,000 mg by mouth daily.    Marland Kitchen levothyroxine (SYNTHROID) 25 MCG tablet Take 1 tablet (25 mcg total) by mouth daily. 90 tablet 1  . loratadine (CLARITIN) 10 MG tablet Take 10 mg by mouth daily.    . Misc Natural Products (CVS GLUCOS-CHONDROIT-MSM TS) TABS Take 2 tablets by mouth at bedtime.    . Misc Natural  Products (ELDERBERRY ZINC/VIT C/IMMUNE MT) Use as directed 2 each in the mouth or throat daily. Elderberry 100 mg Zinc 8 mg Vitamin C 90 mg    . Multiple Vitamins-Minerals (CENTRUM SILVER PO) Take 1 tablet by mouth daily.     . Omega-3 Fatty Acids (FISH OIL) 1000 MG CAPS Take 1,000 mg by mouth daily.    Marland Kitchen orlistat (ALLI) 60 MG capsule Take 60 mg by mouth 3 (three) times daily with meals.     Marland Kitchen OVER THE COUNTER MEDICATION Take 1,040 mg by mouth daily. Liposomal otc supplement    . Probiotic Product (PROBIOTIC PO) Take 1 capsule by mouth daily.    Marland Kitchen pyridOXINE (VITAMIN B-6) 100 MG tablet Take 200 mg by mouth daily.    . riTUXimab (RITUXAN) 500 MG/50ML injection  Inject 1,000 mg into the vein See admin instructions. 2 infusions two weeks apart every 6 months.    . Tixagevimab & Cilgavimab (EVUSHELD) 150 & 150 MG/1.5ML SOLN Inject into the muscle.    Marland Kitchen UNABLE TO FIND Take 2 capsules by mouth daily. Med Name: Monolaurin 936 mg and Zinc 15 mg    . valACYclovir (VALTREX) 500 MG tablet TAKE 1 TABLET(500 MG) BY MOUTH DAILY 90 tablet 1  . venlafaxine XR (EFFEXOR-XR) 75 MG 24 hr capsule Take 1 capsule (75 mg total) by mouth daily. 90 capsule 1  . zinc gluconate 50 MG tablet Take 50 mg by mouth every Monday, Wednesday, and Friday.     No current facility-administered medications on file prior to visit.    Past Medical History:  Diagnosis Date  . H/O breast augmentation   . History of gastric bypass   . Hypertension   . Pelvis fracture ([place])    in 30's  . Pneumonia   . RA (rheumatoid arthritis) ([place])     Past Surgical History:  Procedure Laterality Date  . APPENDECTOMY    . BREAST ENHANCEMENT SURGERY    . BUBBLE STUDY  05/27/2020   Procedure: BUBBLE STUDY;  Surgeon: [name], MD;  Location: [place];  Service: Cardiovascular;;  . GASTRIC BYPASS    . PATENT FORAMEN OVALE(PFO) CLOSURE N/A 06/19/2020   Procedure: PATENT FORAMEN OVALE (PFO) CLOSURE;  Surgeon: [name], MD;  Location: [place] [facility];  Service: Cardiovascular;  Laterality: N/A;  . TEE WITHOUT CARDIOVERSION N/A 05/27/2020   Procedure: TRANSESOPHAGEAL ECHOCARDIOGRAM (TEE);  Surgeon: [name], MD;  Location: [place];  Service: Cardiovascular;  Laterality: N/A;  . TONSILLECTOMY      Social History   Socioeconomic History  . Marital status: Married    Spouse name: Not on file  . Number of children: 0  . Years of education: Not on file  . Highest education level: High school graduate  Occupational History  . 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 Smoking status: Never Smoker  . Smokeless tobacco: Never Used   Substance and Sexual Activity  . Alcohol use: Yes    Alcohol/week: 3.0 - 5.0 standard drinks    Types: 3 - 5 Glasses of wine per week    Comment: per week  . Drug use: Never  . Sexual activity: Not on file  Other Topics Concern  . Not on file  Social History Narrative   Retired 07/10/17 from [employer]   No regular exercise      Lives at home with husband   Caffeine: 2 glasses/day diet dr pepper   Social Determinants  of Health   Financial Resource Strain: Not on file  Food Insecurity: Not on file  Transportation Needs: Not on file  Physical Activity: Not on file  Stress: Not on file  Social Connections: Not on file    Family History  Problem Relation Age of Onset  . Hypertension Mother   . Heart disease Mother   . Heart failure Mother   . Hypertension Father   . Ulcers Father   . Gallstones Father   . Kidney Stones Father   . Hypertension Sister   . Hypertension Brother   . Heart attack Maternal Grandmother     Review of Systems  Constitutional: Negative for chills and fever.  Respiratory: Negative for cough, shortness of breath and wheezing.   Cardiovascular: Negative for chest pain, palpitations and leg swelling.  Gastrointestinal: Negative for abdominal pain, blood in stool, constipation, diarrhea and nausea.       Occ gerd  Neurological: Negative for light-headedness and headaches.  Psychiatric/Behavioral: Positive for dysphoric mood. The patient is nervous/anxious.        Some memory issues       Objective:   Vitals:   09/29/20 1119  BP: 128/70  Pulse: 61  Temp: 98.2 F (36.8 C)  SpO2: 98%   Filed Weights   09/29/20 1119  Weight: 171 lb (77.6 kg)   Body mass index is 30.29 kg/m.  BP Readings from Last 3 Encounters:  09/29/20 128/70  08/25/20 128/74  07/23/20 120/70    Wt Readings from Last 3 Encounters:  09/29/20 171 lb (77.6 kg)  08/25/20 173 lb (78.5 kg)  07/23/20 179 lb (81.2 kg)     Physical Exam Constitutional: She appears  well-developed and well-nourished. No distress.  HENT:  Head: Normocephalic and atraumatic.  Eyes: Conjunctivae normal.  Neck: Neck supple. No tracheal deviation present. No thyromegaly present.  No carotid bruit  Cardiovascular: Normal rate, regular rhythm and normal heart sounds.   No murmur heard.  No edema. Pulmonary/Chest: Effort normal and breath sounds normal. No respiratory distress. She has no wheezes. She has no rales.  Abdominal: Soft. She exhibits no distension. There is no tenderness.  Lymphadenopathy: She has no cervical adenopathy.  Skin: Skin is warm and dry. She is not diaphoretic.  Psychiatric: She has a normal mood and affect. Her behavior is normal.        Assessment & Plan:    Reviewed HM.   See Problem List for Assessment and Plan of chronic medical problems.

## 2020-09-28 NOTE — Patient Instructions (Addendum)
  Blood work was ordered.     Medications changes include :   None    A referral was ordered for neuropsychology at [facility] for a colonoscopy.       Someone from their office will call you to schedule an appointment.    A mammogram was ordered.     Please followup in 6 months

## 2020-09-29 ENCOUNTER — Other Ambulatory Visit: Payer: Self-pay

## 2020-09-29 ENCOUNTER — Encounter: Payer: Self-pay | Admitting: Internal Medicine

## 2020-09-29 ENCOUNTER — Ambulatory Visit (INDEPENDENT_AMBULATORY_CARE_PROVIDER_SITE_OTHER): Payer: Medicare Other | Admitting: Internal Medicine

## 2020-09-29 VITALS — BP 128/70 | HR 61 | Temp 98.2°F | Ht 63.0 in | Wt 171.0 lb

## 2020-09-29 DIAGNOSIS — M810 Age-related osteoporosis without current pathological fracture: Secondary | ICD-10-CM

## 2020-09-29 DIAGNOSIS — R739 Hyperglycemia, unspecified: Secondary | ICD-10-CM | POA: Diagnosis not present

## 2020-09-29 DIAGNOSIS — Z1231 Encounter for screening mammogram for malignant neoplasm of breast: Secondary | ICD-10-CM | POA: Diagnosis not present

## 2020-09-29 DIAGNOSIS — F419 Anxiety disorder, unspecified: Secondary | ICD-10-CM

## 2020-09-29 DIAGNOSIS — Z1211 Encounter for screening for malignant neoplasm of colon: Secondary | ICD-10-CM

## 2020-09-29 DIAGNOSIS — M069 Rheumatoid arthritis, unspecified: Secondary | ICD-10-CM | POA: Diagnosis not present

## 2020-09-29 DIAGNOSIS — I10 Essential (primary) hypertension: Secondary | ICD-10-CM | POA: Diagnosis not present

## 2020-09-29 DIAGNOSIS — E039 Hypothyroidism, unspecified: Secondary | ICD-10-CM | POA: Diagnosis not present

## 2020-09-29 DIAGNOSIS — R413 Other amnesia: Secondary | ICD-10-CM

## 2020-09-29 DIAGNOSIS — E559 Vitamin D deficiency, unspecified: Secondary | ICD-10-CM

## 2020-09-29 DIAGNOSIS — I480 Paroxysmal atrial fibrillation: Secondary | ICD-10-CM

## 2020-09-29 HISTORY — DX: Paroxysmal atrial fibrillation: I48.0

## 2020-09-29 LAB — HEMOGLOBIN A1C: Hgb A1c MFr Bld: 5.3 % (ref 4.6–6.5)

## 2020-09-29 LAB — COMPREHENSIVE METABOLIC PANEL
ALT: 20 U/L (ref 0–35)
AST: 23 U/L (ref 0–37)
Albumin: 4.6 g/dL (ref 3.5–5.2)
Alkaline Phosphatase: 56 U/L (ref 39–117)
BUN: 10 mg/dL (ref 6–23)
CO2: 31 mEq/L (ref 19–32)
Calcium: 9.7 mg/dL (ref 8.4–10.5)
Chloride: 102 mEq/L (ref 96–112)
Creatinine, Ser: 0.47 mg/dL (ref 0.40–1.20)
GFR: 98.5 mL/min (ref >60.00)
Glucose, Bld: 88 mg/dL (ref 70–99)
Potassium: 3.5 mEq/L (ref 3.5–5.1)
Sodium: 142 mEq/L (ref 135–145)
Total Bilirubin: 0.4 mg/dL (ref 0.2–1.2)
Total Protein: 6.7 g/dL (ref 6.0–8.3)

## 2020-09-29 LAB — CBC WITH DIFFERENTIAL/PLATELET
Basophils Absolute: 0 10*3/uL (ref 0.0–0.1)
Basophils Relative: 1 % (ref 0.0–3.0)
Eosinophils Absolute: 0 10*3/uL (ref 0.0–0.7)
Eosinophils Relative: 0.9 % (ref 0.0–5.0)
HCT: 39.7 % (ref 36.0–46.0)
Hemoglobin: 13.4 g/dL (ref 12.0–15.0)
Lymphocytes Relative: 17.3 % (ref 12.0–46.0)
Lymphs Abs: 0.8 10*3/uL (ref 0.7–4.0)
MCHC: 33.7 g/dL (ref 30.0–36.0)
MCV: 95.2 fl (ref 78.0–100.0)
Monocytes Absolute: 0.6 10*3/uL (ref 0.1–1.0)
Monocytes Relative: 12.7 % — ABNORMAL HIGH (ref 3.0–12.0)
Neutro Abs: 3.3 10*3/uL (ref 1.4–7.7)
Neutrophils Relative %: 68.1 % (ref 43.0–77.0)
Platelets: 278 10*3/uL (ref 150.0–400.0)
RBC: 4.17 Mil/uL (ref 3.87–5.11)
RDW: 13.4 % (ref 11.5–15.5)
WBC: 4.8 10*3/uL (ref 4.0–10.5)

## 2020-09-29 LAB — LIPID PANEL
Cholesterol: 174 mg/dL (ref 0–200)
HDL: 90.5 mg/dL (ref >39.00)
LDL Cholesterol: 71 mg/dL (ref 0–99)
NonHDL: 83.14
Total CHOL/HDL Ratio: 2
Triglycerides: 61 mg/dL (ref 0.0–149.0)
VLDL: 12.2 mg/dL (ref 0.0–40.0)

## 2020-09-29 LAB — TSH: TSH: 1.13 u[IU]/mL (ref 0.35–4.50)

## 2020-09-29 LAB — VITAMIN D 25 HYDROXY (VIT D DEFICIENCY, FRACTURES): VITD: 46.18 ng/mL (ref 30.00–100.00)

## 2020-09-29 NOTE — Assessment & Plan Note (Signed)
Chronic  ?Clinically euthyroid ?Currently taking levothyroxine 25 mcg daily ?Check tsh  ?Titrate med dose if needed ?

## 2020-09-29 NOTE — Assessment & Plan Note (Signed)
Chronic Has been getting Prolia through rheumatology She is exercising-stressed regular exercise Calcium and vitamin D daily

## 2020-09-29 NOTE — Assessment & Plan Note (Signed)
Chronic Taking vitamin D daily Check vitamin D level  

## 2020-09-29 NOTE — Assessment & Plan Note (Signed)
Chronic Controlled, stable Also has some depression at times, which is understandable since she is getting divorced Continue venlafaxine 75 mg daily

## 2020-09-29 NOTE — Assessment & Plan Note (Signed)
Chronic A1c

## 2020-09-29 NOTE — Assessment & Plan Note (Signed)
Chronic BP well controlled Continue amlodipine 2.5 mg daily cmp  

## 2020-09-29 NOTE — Assessment & Plan Note (Signed)
Chronic Following with [facility] rheumatology

## 2020-09-29 NOTE — Assessment & Plan Note (Signed)
New problem At times feels like she has a memory issue, which is new She has had some increased anxiety and depression-currently in the process of getting divorced She would like to have this further evaluated-we will refer to neuropsychology for further evaluation

## 2020-09-29 NOTE — Assessment & Plan Note (Signed)
Chronic Following with cardiology-Dr. [name] In sinus rhythm here today On Eliquis 5 mg twice daily Has diltiazem 30 mg to take if needed CBC, CMP, TSH

## 2020-09-30 ENCOUNTER — Encounter: Payer: Self-pay | Admitting: Psychology

## 2020-10-15 DIAGNOSIS — H2513 Age-related nuclear cataract, bilateral: Secondary | ICD-10-CM | POA: Diagnosis not present

## 2020-10-15 DIAGNOSIS — H35363 Drusen (degenerative) of macula, bilateral: Secondary | ICD-10-CM | POA: Diagnosis not present

## 2020-10-15 DIAGNOSIS — H40013 Open angle with borderline findings, low risk, bilateral: Secondary | ICD-10-CM | POA: Diagnosis not present

## 2020-10-15 DIAGNOSIS — H40033 Anatomical narrow angle, bilateral: Secondary | ICD-10-CM | POA: Diagnosis not present

## 2020-10-16 DIAGNOSIS — Z79899 Other long term (current) drug therapy: Secondary | ICD-10-CM | POA: Diagnosis not present

## 2020-10-16 DIAGNOSIS — Z5181 Encounter for therapeutic drug level monitoring: Secondary | ICD-10-CM | POA: Diagnosis not present

## 2020-10-16 DIAGNOSIS — M0609 Rheumatoid arthritis without rheumatoid factor, multiple sites: Secondary | ICD-10-CM | POA: Diagnosis not present

## 2020-10-27 DIAGNOSIS — D225 Melanocytic nevi of trunk: Secondary | ICD-10-CM | POA: Diagnosis not present

## 2020-10-27 DIAGNOSIS — D2272 Melanocytic nevi of left lower limb, including hip: Secondary | ICD-10-CM | POA: Diagnosis not present

## 2020-10-27 DIAGNOSIS — D2262 Melanocytic nevi of left upper limb, including shoulder: Secondary | ICD-10-CM | POA: Diagnosis not present

## 2020-10-27 DIAGNOSIS — D485 Neoplasm of uncertain behavior of skin: Secondary | ICD-10-CM | POA: Diagnosis not present

## 2020-10-27 DIAGNOSIS — L570 Actinic keratosis: Secondary | ICD-10-CM | POA: Diagnosis not present

## 2020-10-27 DIAGNOSIS — L814 Other melanin hyperpigmentation: Secondary | ICD-10-CM | POA: Diagnosis not present

## 2020-10-27 DIAGNOSIS — L723 Sebaceous cyst: Secondary | ICD-10-CM | POA: Diagnosis not present

## 2020-11-17 DIAGNOSIS — Z5181 Encounter for therapeutic drug level monitoring: Secondary | ICD-10-CM | POA: Diagnosis not present

## 2020-11-17 DIAGNOSIS — M0609 Rheumatoid arthritis without rheumatoid factor, multiple sites: Secondary | ICD-10-CM | POA: Diagnosis not present

## 2020-11-17 DIAGNOSIS — Z79899 Other long term (current) drug therapy: Secondary | ICD-10-CM | POA: Diagnosis not present

## 2020-12-07 NOTE — Progress Notes (Deleted)
Cardiology Office Note:    Date:  12/07/2020   ID:  Julia [name], DOB 25-Dec-1950, MRN [id number]  PCP:  [name], MD   [facility] Providers Cardiologist:  None { Click to update primary MD,subspecialty MD or APP then REFRESH:1}  ***  Referring MD: [name], MD   Chief Complaint:  No chief complaint on file.    Patient Profile:    Julia [name] is a 70 y.o. female with:   Rheumatoid arthritis (Rx: Rituxin)  Hx of TIA  Patent Foramen Ovale s/p closure in 12/21  Paroxysmal atrial fibrillation   Hypertension   CT Cardiac 08/2018: Ca2+ score 0  Prior CV studies: *** Echocardiogram 06/19/20 1. Left ventricular ejection fraction, by estimation, is 60 to 65%. The  left ventricle has normal function.  2. PFO device intact. No residual shunt.  3. The mitral valve was not assessed. No evidence of mitral valve  regurgitation.  4. The aortic valve is normal in structure. Aortic valve regurgitation is  not visualized.   PATENT FORAMEN OVALE(PFO) CLOSURE 06/19/2020 Narrative Successful transcatheter PFO closure using intracardiac echo and fluoroscopic guidance with a 25 mm Amplatzer PFO occluder device   CARDIAC TELEMETRY MONITORING-INTERPRETATION ONLY 06/12/2020 Narrative 1. The basic rhythm is normal sinus with an average HR of 78 bpm 2. There are episodes of atrial fibrillation with RVR present, longest lasting 20 minutes, overall burden < 1% 3. No high-grade heart block or pathologic pauses 4. There are rare PVC's  CT Cardiac Scoring 09/04/2018 Coronary Ca2+ score 0  GATED SPECT MYO PERF W/LEXISCAN STRESS 1D 07/27/2018 Narrative  Nuclear stress EF: 68%. The left ventricular ejection fraction is hyperdynamic (>65%).  There was no ST segment deviation noted during stress.  The study is normal.  This is a low risk study. There is no evidence of ischemia or infarction    History of Present Illness: Ms. Cossey was last seen in the structural heart  clinic by Angelena Form, PA-C in 1/22.  She returns for gen card f/u given hx of PAF.  ***        Past Medical History:  Diagnosis Date  . H/O breast augmentation   . History of gastric bypass   . Hypertension   . Pelvis fracture ([place])    in 30's  . Pneumonia   . RA (rheumatoid arthritis) ([place])     Current Medications: No outpatient medications have been marked as taking for the 12/08/20 encounter (Appointment) with Richardson Dopp T, PA-C.     Allergies:   Methotrexate derivatives   Social History   Tobacco Use  . Smoking status: Never Smoker  . Smokeless tobacco: Never Used  Substance Use Topics  . Alcohol use: Yes    Alcohol/week: 3.0 - 5.0 standard drinks    Types: 3 - 5 Glasses of wine per week    Comment: per week  . Drug use: Never     Family Hx: The patient's family history includes Gallstones in her father; Heart attack in her maternal grandmother; Heart disease in her mother; Heart failure in her mother; Hypertension in her brother, father, mother, and sister; Kidney Stones in her father; Ulcers in her father.  ROS   EKGs/Labs/Other Test Reviewed:    EKG:  EKG is *** ordered today.  The ekg ordered today demonstrates ***  Recent Labs: 09/29/2020: ALT 20; BUN 10; Creatinine, Ser 0.47; Hemoglobin 13.4; Platelets 278.0; Potassium 3.5; Sodium 142; TSH 1.13   Recent Lipid Panel Lab Results  Component Value Date/Time   CHOL 174 09/29/2020 12:21 PM   TRIG 61.0 09/29/2020 12:21 PM   HDL 90.50 09/29/2020 12:21 PM   LDLCALC 71 09/29/2020 12:21 PM      Risk Assessment/Calculations:   {Does this patient have ATRIAL FIBRILLATION?:[phone]}  Physical Exam:    VS:  There were no vitals taken for this visit.    Wt Readings from Last 3 Encounters:  09/29/20 171 lb (77.6 kg)  08/25/20 173 lb (78.5 kg)  07/23/20 179 lb (81.2 kg)     Physical Exam ***     ASSESSMENT & PLAN:    ***  {Are you ordering a CV Procedure (e.g. stress test, cath, DCCV, TEE,  etc)?   Press F2        :[id number]}    Dispo:  No follow-ups on file.   Medication Adjustments/Labs and Tests Ordered: Current medicines are reviewed at length with the patient today.  Concerns regarding medicines are outlined above.  Tests Ordered: No orders of the defined types were placed in this encounter.  Medication Changes: No orders of the defined types were placed in this encounter.   Signed, Richardson Dopp, PA-C  12/07/2020 4:27 PM    [facility] [address], [place], [place]  [address] Phone: [phone]; Fax: [phone]

## 2020-12-08 ENCOUNTER — Ambulatory Visit: Payer: Medicare Other | Admitting: Physician Assistant

## 2020-12-08 DIAGNOSIS — I10 Essential (primary) hypertension: Secondary | ICD-10-CM

## 2020-12-08 DIAGNOSIS — I480 Paroxysmal atrial fibrillation: Secondary | ICD-10-CM

## 2020-12-08 DIAGNOSIS — Z8774 Personal history of (corrected) congenital malformations of heart and circulatory system: Secondary | ICD-10-CM

## 2021-01-18 ENCOUNTER — Ambulatory Visit (INDEPENDENT_AMBULATORY_CARE_PROVIDER_SITE_OTHER): Payer: Medicare Other | Admitting: Psychology

## 2021-01-18 ENCOUNTER — Encounter: Payer: Self-pay | Admitting: Psychology

## 2021-01-18 ENCOUNTER — Other Ambulatory Visit: Payer: Self-pay

## 2021-01-18 ENCOUNTER — Ambulatory Visit: Payer: Medicare Other | Admitting: Psychology

## 2021-01-18 DIAGNOSIS — R4189 Other symptoms and signs involving cognitive functions and awareness: Secondary | ICD-10-CM | POA: Diagnosis not present

## 2021-01-18 DIAGNOSIS — F4323 Adjustment disorder with mixed anxiety and depressed mood: Secondary | ICD-10-CM

## 2021-01-18 NOTE — Progress Notes (Signed)
   Psychometrician Note   Cognitive testing was administered to [name] by [name], B.S. (psychometrist) under the supervision of Dr. Christia Reading, Ph.D., licensed psychologist on 01/18/21. Ms. Vahey did not appear overtly distressed by the testing session per behavioral observation or responses across self-report questionnaires. Rest breaks were offered.    The battery of tests administered was selected by Dr. Christia Reading, Ph.D. with consideration to Ms. Tinkham's current level of functioning, the nature of her symptoms, emotional and behavioral responses during interview, level of literacy, observed level of motivation/effort, and the nature of the referral question. This battery was communicated to the psychometrist. Communication between Dr. Christia Reading, Ph.D. and the psychometrist was ongoing throughout the evaluation and Dr. Christia Reading, Ph.D. was immediately accessible at all times. Dr. Christia Reading, Ph.D. provided supervision to the psychometrist on the date of this service to the extent necessary to assure the quality of all services provided.    Jericca Russett will return within approximately 1-2 weeks for an interactive feedback session with Dr. Melvyn Novas at which time her test performances, clinical impressions, and treatment recommendations will be reviewed in detail. Ms. Heinrichs understands she can contact our office should she require our assistance before this time.  A total of 135 minutes of billable time were spent face-to-face with Ms. Barra by the psychometrist. This includes both test administration and scoring time. Billing for these services is reflected in the clinical report generated by Dr. Christia Reading, Ph.D.  This note reflects time spent with the psychometrician and does not include test scores or any clinical interpretations made by Dr. Melvyn Novas. The full report will follow in a separate note.

## 2021-01-18 NOTE — Progress Notes (Signed)
NEUROPSYCHOLOGICAL EVALUATION [place]. [facility] Department of Neurology  Date of Evaluation: January 18, 2021  Reason for Referral:   Julia [name] is a 70 y.o. right-handed Caucasian female referred by  [name], M.D. , to characterize her current cognitive functioning and assist with diagnostic clarity and treatment planning in the context of subjective cognitive decline.   Assessment and Plan:   Clinical Impression(s): Julia [name] pattern of performance is suggestive of neuropsychological functioning within normal limits. Performances across all cognitive domains were consistently appropriate. This included processing speed, attention/concentration, executive functioning, receptive and expressive language, visuospatial abilities, and learning and memory. No relative weaknesses were exhibited. Julia [name] denied difficulties completing instrumental activities of daily living (ADLs) independently.   Relative to subjective dysfunction, Julia [name] reported being under a significant amount of stress given her ongoing divorce and adjustment to her post-divorce life. In addition to this, she reported mild levels of acute anxiety and depression, as well as moderate sleep dysfunction. Overall, the most likely culprit for ongoing dysfunction is mild psychiatric distress coupled with various psychosocial stressors. Specific to memory, Julia [name] was able to learn novel verbal and visual information efficiently and retain this knowledge after lengthy delays. Overall, memory performance combined with intact performances across other areas of cognitive functioning is not suggestive of Alzheimer's disease. Likewise, her cognitive and behavioral profile is not suggestive of any other form of neurodegenerative illness presently.  Diagnosis: Adjustment disorder with mixed anxiety and depression mood  Recommendations: Should Julia [name]. Chambless express concerns surrounding worsening cognitive dysfunction  in the future, a repeat evaluation would be warranted at that time. The current evaluation will serve as an excellent baseline to compare any future evaluations against.   A combination of medication and psychotherapy has been shown to be most effective at treating symptoms of anxiety and depression. As such, Julia [name] is encouraged to speak with her prescribing physician regarding medication adjustments to optimally manage these symptoms.   Likewise, Julia [name] is encouraged to consider engaging in short-term psychotherapy to address symptoms of psychiatric distress. She would benefit from an active and collaborative therapeutic environment, rather than one purely supportive in nature. Recommended treatment modalities include Cognitive Behavioral Therapy (CBT) or Acceptance and Commitment Therapy (ACT).  Julia [name] is encouraged to attend to lifestyle factors for brain health (e.g., regular physical exercise, good nutrition habits, regular participation in cognitively-stimulating activities, and general stress management techniques), which are likely to have benefits for both emotional adjustment and cognition. In fact, in addition to promoting good general health, regular exercise incorporating aerobic activities (e.g., brisk walking, jogging, cycling, etc.) has been demonstrated to be a very effective treatment for depression and stress, with similar efficacy rates to both antidepressant medication and psychotherapy. Optimal control of vascular risk factors (including safe cardiovascular exercise and adherence to dietary recommendations) is encouraged. Continued participation in activities which provide mental stimulation and social interaction is also recommended.   If interested, there are some activities which have therapeutic value and can be useful in keeping her cognitively stimulated. For suggestions, Julia [name] is encouraged to go to the following website:  https://www.[facility].org/get-to-know-barrow/centers-programs/neurorehabilitation-center/neuro-rehab-apps-and-games/ which has options, categorized by level of difficulty. It should be noted that these activities should not be viewed as a substitute for therapy.  Memory can be improved using internal strategies such as rehearsal, repetition, chunking, mnemonics, association, and imagery. External strategies such as written notes in a consistently used memory journal, visual and nonverbal auditory cues such as a calendar  on the refrigerator or appointments with alarm, such as on a cell phone, can also help maximize recall.    To address problems with fluctuating attention, she may wish to consider:   -Avoiding external distractions when needing to concentrate   -Limiting exposure to fast paced environments with multiple sensory demands   -Writing down complicated information and using checklists   -Attempting and completing one task at a time (i.e., no multi-tasking)   -Verbalizing aloud each step of a task to maintain focus   -Reducing the amount of information considered at one time  Review of Records:   Julia [name] was seen by [facility] ([facility]; Sarina Ill, M.D.) on 05/10/2020 after a prior ED admission on 02/14/2020. She was seen in the ED presenting with headache, photophobia, lightheadedness, "everything blinking", nausea, vomiting,  and changed speech pattern. Stroke code was activated. Her initial CT and labs were negative. Brain MRI revealed no acute abnormality. Her diagnosis at that time was said to be complex migraine versus peripheral vertigo. No focal deficits in arm/leg strength was observed. Symptoms were said to improve and she was discharged home. While meeting with Dr. [name], she reported this event consistent with prior medical records. Dr. [name] expressed concerns surrounding a transient ischemic attack (TIA).  A heart monitor was placed and atrial fibrillation  was later detected. A patent foramen ovale (PFO) was also found. She underwent a surgical procedure related to the closure of her PFO and was discharged home without incident following this procedure on 06/19/2020.   She followed up with [facility] [name], NP) on 08/25/2020. Since undergoing her PFO procedure, Julia [name] denied any additional TIA symptoms. She reported compliance with all medications without notable side effects. Blood pressure was 128/74 and no additional concerns were reported at that time.  Julia [name] most recently met with her PCP [name], M.D.) on 09/29/2020 for follow-up. At that time, she reported a significant increase in stress surrounding ongoing divorce proceedings and legal/financial challenges surrounding this. With this ongoing, she reported observing ongoing memory concerns. She also acknowledged some symptoms of depression and anxiety. Ultimately, Julia [name] was referred for a comprehensive neuropsychological evaluation to characterize her cognitive abilities and to assist with diagnostic clarity and treatment planning.   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   Osteoporosis 01/14/2017   07/2018 - spine  -2.3,  RFN  -1.9,  LFN  -2.3   - improvement in hips dexa 07/25/17 - spine  -3.1,   RFN  -2.2,  LFN  -2.4  - started on prolia DEXA 05/13/2010 in [place], osteopenia   Spine: -2.3   LFN  -1.7   RFN:   -1.8     FRAX  15%, 1.9%    Paroxysmal atrial fibrillation 09/29/2020   PFO with atrial septal aneurysm 06/19/2020   Plant dermatitis 01/08/2020   Pneumonia  Purpura 04/18/2017   Rheumatoid arthritis 01/13/2017   Following at [facility]  Has been on Prednisone, Plaquenil, Arava, Humira, etolodac, Enbrel, Orencia, Remicade and Rituxan     Sebaceous cyst 04/18/2017   TIA (transient ischemic attack) 04/22/2020   Ventral hernia without obstruction or gangrene 11/01/2019   Noted on Ct scan 10/2019   Vitamin D deficiency 06/22/2017    Past Surgical History:  Procedure Laterality Date   APPENDECTOMY     BREAST ENHANCEMENT SURGERY     BUBBLE STUDY  05/27/2020   Procedure: BUBBLE STUDY;  Surgeon: [name], MD;  Location: [place];  Service: Cardiovascular;;   GASTRIC BYPASS     PATENT FORAMEN OVALE(PFO) CLOSURE N/A 06/19/2020   Procedure: PATENT FORAMEN OVALE (PFO) CLOSURE;  Surgeon: [name], MD;  Location: [place] [facility];  Service: Cardiovascular;  Laterality: N/A;   TEE WITHOUT CARDIOVERSION N/A 05/27/2020   Procedure: TRANSESOPHAGEAL ECHOCARDIOGRAM (TEE);  Surgeon: [name], MD;  Location: [place];  Service: Cardiovascular;  Laterality: N/A;   TONSILLECTOMY     Current Outpatient Medications:    amLODipine (NORVASC) 2.5 MG tablet, Take 1 tablet (2.5 mg total) by mouth daily., Disp: 90 tablet, Rfl: 3   apixaban (ELIQUIS) 5 MG TABS tablet, Take 1 tablet (5 mg total) by mouth 2 (two) times daily., Disp: 180 tablet, Rfl: 2   Ascorbic Acid (VITAMIN C PO), Take 3,200 mg by mouth daily. 1600 mg each, Disp: , Rfl:    calcium carbonate (OS-CAL) 1250 (500 Ca) MG chewable tablet, Chew by mouth., Disp: , Rfl:    calcium carbonate (TUMS - DOSED IN MG ELEMENTAL CALCIUM) 500 MG chewable tablet, Chew 2 tablets by mouth daily as needed for indigestion or heartburn., Disp: , Rfl:    Calcium Citrate-Vitamin D (CALCIUM + D PO), Take 1 capsule by mouth 2  (two) times daily., Disp: , Rfl:    Cholecalciferol (VITAMIN D3) 250 MCG (10000 UT) TABS, Take 5,000 Units by mouth 2 (two) times daily. , Disp: , Rfl:    Coenzyme Q10 (COQ-10) 100 MG CAPS, Take 200 mg by mouth daily., Disp: , Rfl:    diltiazem (CARDIZEM) 30 MG tablet, Take 1 tablet every 4 hours AS NEEDED for heart rate >100, Disp: 45 tablet, Rfl: 1   guaiFENesin (MUCINEX) 600 MG 12 hr tablet, Take 600 mg by mouth daily., Disp: , Rfl:    ivermectin (STROMECTOL) 3 MG TABS tablet, Take 15 mg by mouth every Sunday., Disp: , Rfl:    Krill Oil 1000 MG CAPS, Take 1,000 mg by mouth daily., Disp: , Rfl:    levothyroxine (SYNTHROID) 25 MCG tablet, Take 1 tablet (25 mcg total) by mouth daily., Disp: 90 tablet, Rfl: 1   loratadine (CLARITIN) 10 MG tablet, Take 10 mg by mouth daily., Disp: , Rfl:    Misc Natural Products ([employer] GLUCOS-CHONDROIT-MSM TS) TABS, Take 2 tablets by mouth at bedtime., Disp: , Rfl:    Misc Natural Products (ELDERBERRY ZINC/VIT C/IMMUNE MT), Use as directed 2 each in the mouth or throat daily. Elderberry 100 mg Zinc 8 mg Vitamin C 90 mg, Disp: , Rfl:    Multiple Vitamins-Minerals (CENTRUM SILVER PO), Take 1 tablet by mouth daily. , Disp: , Rfl:    Omega-3 Fatty Acids (FISH OIL) 1000 MG CAPS, Take 1,000 mg by mouth daily., Disp: , Rfl:    orlistat (ALLI) 60 MG capsule, Take 60 mg by mouth 3 (three) times daily with meals. , Disp: , Rfl:  OVER THE COUNTER MEDICATION, Take 1,040 mg by mouth daily. Liposomal otc supplement, Disp: , Rfl:    Probiotic Product (PROBIOTIC PO), Take 1 capsule by mouth daily., Disp: , Rfl:    pyridOXINE (VITAMIN B-6) 100 MG tablet, Take 200 mg by mouth daily., Disp: , Rfl:    riTUXimab (RITUXAN) 500 MG/50ML injection, Inject 1,000 mg into the vein See admin instructions. 2 infusions two weeks apart every 6 months., Disp: , Rfl:    Tixagevimab & Cilgavimab (EVUSHELD) 150 & 150 MG/1.5ML SOLN, Inject into the muscle., Disp: , Rfl:    UNABLE TO FIND, Take 2  capsules by mouth daily. Med Name: Monolaurin 936 mg and Zinc 15 mg, Disp: , Rfl:    valACYclovir (VALTREX) 500 MG tablet, TAKE 1 TABLET(500 MG) BY MOUTH DAILY, Disp: 90 tablet, Rfl: 1   venlafaxine XR (EFFEXOR-XR) 75 MG 24 hr capsule, Take 1 capsule (75 mg total) by mouth daily., Disp: 90 capsule, Rfl: 1   zinc gluconate 50 MG tablet, Take 50 mg by mouth every Monday, Wednesday, and Friday., Disp: , Rfl:   Clinical Interview:   The following information was obtained during a clinical interview with Julia [name] and her brother prior to cognitive testing.  Cognitive Symptoms: Decreased short-term memory: Endorsed. Symptoms were generalized in nature with Julia [name] primarily emphasizing that memory capabilities seem "different" than what she is used to. She alluded to some difficulties with recalling details of conversations and names; however, these were more described as sporadic rather than consistent weaknesses.  Decreased long-term memory: Denied. Decreased attention/concentration: Endorsed. She described herself as being less focused and more prone to becoming easily distracted. She also acknowledged that various psychosocial stressors (see below) have been "overwhelming," which have worsened these abilities.  Reduced processing speed: Denied. Difficulties with executive functions: Endorsed. Her biggest reported change surrounded organization, where she described greater difficulty remaining organized. She did acknowledge that she has recently taken on far greater responsibilities in her personal life, likely creating/exacerbating these difficulties. Trouble with impulsivity was denied, as were any overt personality changes.  Difficulties with emotion regulation: Denied. Difficulties with receptive language: Denied. Difficulties with word finding: Denied. Decreased visuoperceptual ability: Denied.  Trajectory of deficits: Around November 2021, she learned that her husband had met another women,  ultimately leading to him filing for divorce. Since that time, she has been dealing with various legal and financial divorce proceedings, which have been quite stressful. In addition to this, a patent foramen ovale (PFO) was detected and surgically corrected in December 2021. Memory and other cognitive dysfunction has been present since these events have transpired and remained present to present day. Her brother did not report any noticeable or significant change in Julia [name]'s cognitive abilities from his perspective.   Difficulties completing ADLs: Denied.  Additional Medical History: History of traumatic brain injury/concussion: Denied. History of stroke: Denied. However, there is a history of a prior TIA as described above. Symptoms have not been present since her PFO correction.  History of seizure activity: Denied. History of known exposure to toxins: Denied. Symptoms of chronic pain: Endorsed. She reported a history of severe rheumatoid arthritis but has noticed benefit from taking Rituxan to treat these symptoms. She did on several occasions describe these symptoms as debilitating and/or disabling however.  Experience of frequent headaches/migraines: Denied. Frequent instances of dizziness/vertigo: Denied. Her brother did remind her of an event approximately two weeks prior where she reported passing out in her yard. She noted that she had been  working in the yard on a particularly hot day and wondered if this was due to her being dehydrated or not eating well enough. She estimated being unconscious for approximately 30 minutes before being awoken by her dogs scratching on a nearby door/window. No other events like this were reported.   Sensory changes: She wears reading glasses with positive effect. She also reported potential developing cataracts but stated that these had not yet impacted her visual acuity. Other sensory changes/difficulties (e.g., hearing, taste, or smell) were denied.   Balance/coordination difficulties: Denied. She also denied any recent falls outside of her syncopal episode described above.  Other motor difficulties: Denied.  Sleep History: Estimated hours obtained each night: 6 hours.  Difficulties falling asleep: Denied. Difficulties staying asleep: Endorsed. She additionally noted that she will have some trouble falling back asleep once she awakes during the night.  Feels rested and refreshed upon awakening: Endorsed.  History of snoring: Denied. History of waking up gasping for air: Denied. Witnessed breath cessation while asleep: Denied.  History of vivid dreaming: Denied. Excessive movement while asleep: Denied. Instances of acting out her dreams: Denied.  Psychiatric/Behavioral Health History: Depression: Acutely, she described herself as "moody." When asked directly, she did acknowledge symptoms of both depression and anxiety stemming from her divorce proceedings and adjustment to being single and having to directly manage her estate so closely. Prior to surprise divorce proceedings, she denied any mental health concerns or diagnoses and described herself as "pretty even-keeled." Current or remote suicidal ideation, intent, or plan was denied.  Anxiety: Acutely, symptoms were specific to her divorce proceedings as described above. She denied being previously diagnosed with an anxiety disorder to her knowledge.  Mania: Denied. Trauma History: Denied. Visual/auditory hallucinations: Denied. Delusional thoughts: Denied.  Tobacco: Denied. Alcohol: She reported consuming a concoction consisting of some white wine, Sprite, and cranberry juice several times per week. She denied a history of problematic alcohol abuse or dependence.  Recreational drugs: Denied. Caffeine: She reported consuming near one 2-liter bottle of Diet Dr. Malachi Bonds on a daily basis.   Family History: Problem Relation Age of Onset   Hypertension Mother    Heart disease Mother     Heart failure Mother    Hypertension Father    Ulcers Father    Gallstones Father    Kidney Stones Father    Hypertension Sister    Hypertension Brother    Heart attack Maternal Grandmother    This information was confirmed by Julia [name]. Cleon Gustin.  Academic/Vocational History: Highest level of educational attainment: 12 years. She graduated from high school, describing herself as a good (mostly A) student in academic settings. No relative weaknesses were identified.  History of developmental delay: Denied. History of grade repetition: Denied. Enrollment in special education courses: Denied. History of LD/ADHD: Denied.  Employment: Retired. 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   Evaluation Results:   Behavioral Observations: Julia [name] was accompanied by her brother, arrived to her appointment on time, and was appropriately dressed and groomed. She appeared alert and oriented. Observed gait and station were within normal limits. Gross motor functioning appeared intact upon informal observation and no abnormal movements (e.g., tremors) were noted. Her affect was generally relaxed and positive. Spontaneous speech was fluent and word finding difficulties were not observed during the clinical interview. Thought processes were coherent, organized, and normal in content. Insight into her cognitive difficulties appeared adequate. During testing, sustained attention was appropriate.  Task engagement was adequate and she persisted when challenged. Overall, Julia [name] was cooperative with the clinical interview and subsequent testing procedures.   Adequacy of Effort: The validity of neuropsychological testing is limited by the extent to which the individual being tested may be assumed to have exerted adequate effort during testing. Julia [name]. Ramser expressed her intention to perform to the best of her abilities and exhibited adequate  task engagement and persistence. Scores across stand-alone and embedded performance validity measures were within expectation. As such, the results of the current evaluation are believed to be a valid representation of Julia [name]. [place]'s current cognitive functioning.  Test Results: Julia [name]. Amyx was fully oriented at the time of the current evaluation.  Intellectual abilities based upon educational and vocational attainment were estimated to be in the average to above average range. Premorbid abilities were estimated to be within the average range based upon a single-word reading test.   Processing speed was average to above average. Basic attention was well above average. More complex attention (e.g., working memory) was also well above average. Executive functioning was average to above average.  While not directly assessed, receptive language abilities were believed to be intact. Julia [name]. Bandel did not exhibit any difficulties comprehending task instructions and answered all questions asked of her appropriately. Assessed expressive language (e.g., verbal fluency and confrontation naming) was average to well above average.     Assessed visuospatial/visuoconstructional abilities were average to above average.    Learning (i.e., encoding) of novel verbal and visual information was average to exceptionally high. Spontaneous delayed recall (i.e., retrieval) of previously learned information was average to well above average. Retention rates were 91% across a story learning task, 114% across a list learning task, and 63% across a shape learning task. Performance across recognition tasks was average to above average, suggesting evidence for information consolidation.   Results of emotional screening instruments suggested that recent symptoms of generalized anxiety were in the mild range, while symptoms of depression were also within the mild range. A screening instrument assessing recent sleep quality suggested the  presence of moderate sleep dysfunction.  Tables of Scores:   Note: This summary of test scores accompanies the interpretive report and should not be considered in isolation without reference to the appropriate sections in the text. Descriptors are based on appropriate normative data and may be adjusted based on clinical judgment. Terms such as "Within Normal Limits" and "Outside Normal Limits" are used when a more specific description of the test score cannot be determined.       Percentile - Normative Descriptor > 98 - Exceptionally High 91-97 - Well Above Average 75-90 - Above Average 25-74 - Average 9-24 - Below Average 2-8 - Well Below Average < 2 - Exceptionally Low       Validity:   DESCRIPTOR       ACS Word Choice: --- --- Within Normal Limits  Dot Counting Test: --- --- Within Normal Limits  NAB EVI: --- --- Within Normal Limits  D-KEFS Color Word Effort Index: --- --- Within Normal Limits       Orientation:      Raw Score Percentile   NAB Orientation, Form 1 29/29 --- ---       Cognitive Screening:      Raw Score Percentile   SLUMS: 27/30 --- ---       Intellectual Functioning:      Standard Score Percentile   Barona Formula Estimated Premorbid IQ 107 68 Average  Standard Score Percentile   Test of Premorbid Functioning: 101 53 Average       Memory:     NAB Memory Module, Form 1: Standard Score/ T Score Percentile   Total Memory Index 110 75 Above Average  List Learning       Total Trials 1-3 25/36 (57) 75 Above Average    List B 5/12 (57) 75 Above Average    Short Delay Free Recall 7/12 (51) 54 Average    Long Delay Free Recall 8/12 (56) 73 Average    Retention Percentage 114 (56) 73 Average    Recognition Discriminability 5 (43) 25 Average  Shape Learning       Total Trials 1-3 18/27 (60) 84 Above Average    Delayed Recall 5/9 (49) 46 Average    Retention Percentage 63 (39) 14 Below Average    Recognition Discriminability 9 (62) 88 Above Average   Story Learning       Immediate Recall 53/80 (43) 25 Average    Delayed Recall 30/40 (47) 38 Average    Retention Percentage 91 (51) 54 Average  Daily Living Memory       Immediate Recall 49/51 (71) 98 Exceptionally High    Delayed Recall 17/17 (63) 91 Well Above Average    Retention Percentage 100 (59) 82 Above Average    Recognition Hits 10/10 (60) 84 Above Average       Attention/Executive Function:     Trail Making Test (TMT): Raw Score (T Score) Percentile     Part A 31 secs.,  0 errors (53) 62 Average    Part B 60 secs.,  0 errors (60) 84 Above Average         Scaled Score Percentile   WAIS-IV Coding: 13 84 Above Average       NAB Attention Module, Form 1: T Score Percentile     Digits Forward 67 96 Well Above Average    Digits Backwards 66 95 Well Above Average       D-KEFS Color-Word Interference Test: Raw Score (Scaled Score) Percentile     Color Naming 32 secs. (10) 50 Average    Word Reading 22 secs. (11) 63 Average    Inhibition 57 secs. (12) 75 Above Average      Total Errors 2 errors (10) 50 Average    Inhibition/Switching 63 secs. (12) 75 Above Average      Total Errors 3 errors (10) 50 Average       D-KEFS Verbal Fluency Test: Raw Score (Scaled Score) Percentile     Letter Total Correct 52 (15) 95 Well Above Average    Category Total Correct 38 (11) 63 Average    Category Switching Total Correct 13 (11) 63 Average    Category Switching Accuracy 12 (11) 63 Average      Total Set Loss Errors 1 (11) 63 Average      Total Repetition Errors 2 (11) 63 Average       D-KEFS 20 Questions Test: Scaled Score Percentile     Total Weighted Achievement Score 15 95 Well Above Average    Initial Abstraction Score 15 95 Well Above Average       [place]consin Card Sorting Test: Raw Score Percentile     Categories (trials) 4 (64) >16 Average    Total Errors 14 75 Above Average    Perseverative Errors 6 86 Above Average    Non-Perseverative Errors 8 42 Average    Failure  to Maintain Set 0 --- ---  Language:     Verbal Fluency Test: Raw Score (T Score) Percentile     Phonemic Fluency (FAS) 52 (61) 86 Above Average    Animal Fluency 17 (48) 42 Average        NAB Language Module, Form 1: T Score Percentile     Naming 30/31 (57) 75 Above Average       Visuospatial/Visuoconstruction:      Raw Score Percentile   Clock Drawing: 10/10 --- Within Normal Limits       NAB Spatial Module, Form 1: T Score Percentile     Figure Drawing Copy 60 84 Above Average    Figure Drawing Immediate Recall 69 97 Well Above Average        Scaled Score Percentile   WAIS-IV Block Design: 11 63 Average  WAIS-IV Matrix Reasoning: 10 50 Average       Mood and Personality:      Raw Score Percentile   Beck Depression Inventory - II: 19 --- Mild  PROMIS Anxiety Questionnaire: 18 --- Mild       Additional Questionnaires:      Raw Score Percentile   PROMIS Sleep Disturbance Questionnaire: 31 --- Moderate   Informed Consent and Coding/Compliance:   The current evaluation represents a clinical evaluation for the purposes previously outlined by the referral source and is in no way reflective of a forensic evaluation.   Julia [name]. Missey was provided with a verbal description of the nature and purpose of the present neuropsychological evaluation. Also reviewed were the foreseeable risks and/or discomforts and benefits of the procedure, limits of confidentiality, and mandatory reporting requirements of this provider. The patient was given the opportunity to ask questions and receive answers about the evaluation. Oral consent to participate was provided by the patient.   This evaluation was conducted by Christia Reading, Ph.D., licensed clinical neuropsychologist. Julia [name]. [employer] completed a clinical interview with Dr. Melvyn Novas, billed as one unit [phone], and 135 minutes of cognitive testing and scoring, billed as one unit [phone] and four additional units [phone]. Psychometrist [name], B.S.,  assisted Dr. Melvyn Novas with test administration and scoring procedures. As a separate and discrete service, Dr. Melvyn Novas spent a total of 120 minutes in interpretation and report writing billed as one unit [phone] and one unit [phone].

## 2021-01-19 ENCOUNTER — Telehealth: Payer: Self-pay | Admitting: Internal Medicine

## 2021-01-19 ENCOUNTER — Other Ambulatory Visit: Payer: Self-pay

## 2021-01-19 MED ORDER — TRIAMCINOLONE ACETONIDE 0.5 % EX OINT: 1.0000 "application " | TOPICAL_OINTMENT | Freq: Two times a day (BID) | CUTANEOUS | 2 refills | Status: DC

## 2021-01-19 NOTE — Telephone Encounter (Signed)
    Patient calling to request new order for triamcinolone ointment (KENALOG) 0.5 %  Pharmacy [place], [place]

## 2021-01-21 DIAGNOSIS — E039 Hypothyroidism, unspecified: Secondary | ICD-10-CM | POA: Diagnosis not present

## 2021-01-21 DIAGNOSIS — N951 Menopausal and female climacteric states: Secondary | ICD-10-CM | POA: Diagnosis not present

## 2021-01-21 DIAGNOSIS — M069 Rheumatoid arthritis, unspecified: Secondary | ICD-10-CM | POA: Diagnosis not present

## 2021-01-21 DIAGNOSIS — Z01419 Encounter for gynecological examination (general) (routine) without abnormal findings: Secondary | ICD-10-CM | POA: Diagnosis not present

## 2021-01-21 DIAGNOSIS — M810 Age-related osteoporosis without current pathological fracture: Secondary | ICD-10-CM | POA: Diagnosis not present

## 2021-01-21 DIAGNOSIS — Z1231 Encounter for screening mammogram for malignant neoplasm of breast: Secondary | ICD-10-CM | POA: Diagnosis not present

## 2021-01-21 LAB — HM MAMMOGRAPHY

## 2021-01-25 ENCOUNTER — Ambulatory Visit (INDEPENDENT_AMBULATORY_CARE_PROVIDER_SITE_OTHER): Payer: Medicare Other | Admitting: Psychology

## 2021-01-25 ENCOUNTER — Other Ambulatory Visit: Payer: Self-pay

## 2021-01-25 DIAGNOSIS — F4323 Adjustment disorder with mixed anxiety and depressed mood: Secondary | ICD-10-CM

## 2021-01-25 NOTE — Progress Notes (Signed)
   Neuropsychology Feedback Session Tillie Rung. [facility] Department of Neurology  Reason for Referral:   Ronnie Mallette is a 70 y.o. right-handed Caucasian female referred by  [name], M.D. , to characterize her current cognitive functioning and assist with diagnostic clarity and treatment planning in the context of subjective cognitive decline.   Feedback:   Ms. Kastens completed a comprehensive neuropsychological evaluation on 01/18/2021. Please refer to that encounter for the full report and recommendations. Briefly, results suggested neuropsychological functioning within normal limits. Performances across all cognitive domains were consistently appropriate. Relative to subjective dysfunction, Ms. Risden reported being under a significant amount of stress given her ongoing divorce and adjustment to her post-divorce life. In addition to this, she reported mild levels of acute anxiety and depression, as well as moderate sleep dysfunction. Overall, the most likely culprit for ongoing dysfunction is mild psychiatric distress coupled with various psychosocial stressors.  Ms. Heatley was unaccompanied during the current feedback session. Content of the current session focused on the results of her neuropsychological evaluation. Ms. Geil was given the opportunity to ask questions and her questions were answered. She was encouraged to reach out should additional questions arise. A copy of her report was provided at the conclusion of the visit.      Less than 16 minutes were spent conducting the current feedback session with Ms. Gamboa.

## 2021-01-26 ENCOUNTER — Telehealth: Payer: Self-pay | Admitting: Internal Medicine

## 2021-01-26 NOTE — Chronic Care Management (AMB) (Signed)
  Care Management   Follow Up Note   01/26/2021 Name: Julia [name] MRN: [id number] DOB: August 18, 1950   Referred by: [name], MD Reason for referral : No chief complaint on file.   An unsuccessful telephone outreach was attempted today. The patient was referred to the case management team for assistance with care management and care coordination.   Follow Up Plan: The care management team will reach out to the patient again over the next 7 days.   Noelle Penner Upstream Scheduler

## 2021-01-27 DIAGNOSIS — R202 Paresthesia of skin: Secondary | ICD-10-CM | POA: Diagnosis not present

## 2021-01-27 DIAGNOSIS — M0609 Rheumatoid arthritis without rheumatoid factor, multiple sites: Secondary | ICD-10-CM | POA: Diagnosis not present

## 2021-01-27 DIAGNOSIS — Z79899 Other long term (current) drug therapy: Secondary | ICD-10-CM | POA: Diagnosis not present

## 2021-01-27 DIAGNOSIS — Z5181 Encounter for therapeutic drug level monitoring: Secondary | ICD-10-CM | POA: Diagnosis not present

## 2021-02-01 ENCOUNTER — Telehealth: Payer: Self-pay | Admitting: Internal Medicine

## 2021-02-01 NOTE — Chronic Care Management (AMB) (Signed)
  Chronic Care Management   Note  02/01/2021 Name: Julia [name] MRN: [id number] DOB: 13-Jan-1951  Julia [name] is a 70 y.o. year old female who is a primary care patient of Burns, Claudina Lick, MD. I reached out to [name] by phone today in response to a referral sent by [name]'s PCP, Quay Burow, Claudina Lick, MD.   [name] was given information about Chronic Care Management services today including:  CCM service includes personalized support from designated clinical staff supervised by her physician, including individualized plan of care and coordination with other care providers 24/7 contact phone numbers for assistance for urgent and routine care needs. Service will only be billed when office clinical staff spend 20 minutes or more in a month to coordinate care. Only one practitioner may furnish and bill the service in a calendar month. The patient may stop CCM services at any time (effective at the end of the month) by phone call to the office staff.   Patient agreed to services and verbal consent obtained.   Follow up plan:   Lauretta Grill Upstream Scheduler

## 2021-02-09 ENCOUNTER — Telehealth: Payer: Self-pay | Admitting: Gastroenterology

## 2021-02-09 NOTE — Telephone Encounter (Signed)
Patient called states she is having a lot of diarrhea and abdominal pain for the last few months seeking advise.

## 2021-02-09 NOTE — Telephone Encounter (Signed)
Spoke with patient, advised that she has not been seen in 2 years and will need an appt for evaluation before we can advise. Patient is scheduled for an appt with Tye Savoy, NP on Wednesday, 03/17/21 at 11 am. Patient has been advised to reach out to her PCP in the interim. Patient verbalized understanding and had no concerns at the end of the call.

## 2021-02-15 ENCOUNTER — Other Ambulatory Visit: Payer: Self-pay | Admitting: Internal Medicine

## 2021-02-25 ENCOUNTER — Telehealth: Payer: Self-pay

## 2021-02-25 NOTE — Telephone Encounter (Signed)
pt has stated she would like to receive her Prolia injection on her next visit as she was able to get it when she went to her Rheumatoid Doctor. Pt next visit with Dr. Quay Burow is 03/22/2021

## 2021-02-27 ENCOUNTER — Other Ambulatory Visit: Payer: Self-pay | Admitting: Internal Medicine

## 2021-02-28 DIAGNOSIS — S90561A Insect bite (nonvenomous), right ankle, initial encounter: Secondary | ICD-10-CM | POA: Diagnosis not present

## 2021-03-01 ENCOUNTER — Other Ambulatory Visit (HOSPITAL_COMMUNITY): Payer: Self-pay | Admitting: *Deleted

## 2021-03-01 MED ORDER — APIXABAN 5 MG PO TABS: 5.0000 mg | ORAL_TABLET | Freq: Two times a day (BID) | ORAL | 2 refills | Status: DC

## 2021-03-11 ENCOUNTER — Telehealth: Payer: Self-pay

## 2021-03-11 NOTE — Progress Notes (Signed)
Chronic Care Management Pharmacy Assistant   Name: Julia [name]  MRN: [id number] DOB: [birth year]  Julia [name] is an 70 y.o. year old female who presents for his initial CCM visit with the clinical pharmacist.   Recent office visits:  09/29/20 Julia Gosling, MD - Seen for general follow up - labs ordered - No medication changes - Follow up in 6 months   Recent consult visits:  01/27/21 Rheumatology - No noted listed.  01/25/21 Julia Coca PhD, Neurology - Seen for neuropsychological evaluation - No follow or medication changes noted  01/18/21 Julia [name], Psychology - Seen for Cognitive testing - No follow or medication changes noted  11/17/20 [facility] - No noted listed  10/27/20 Dermatology - No noted listed  10/16/20 Rheumatology - No noted listed  10/15/20 Opthalmology - No noted listed   Hospital visits:  None in previous 6 months  Medications: Outpatient Encounter Medications as of 03/11/2021  Medication Sig   amLODipine (NORVASC) 2.5 MG tablet Take 1 tablet (2.5 mg total) by mouth daily.   apixaban (ELIQUIS) 5 MG TABS tablet Take 1 tablet (5 mg total) by mouth 2 (two) times daily.   Ascorbic Acid (VITAMIN C PO) Take 3,200 mg by mouth daily. 1600 mg each   calcium carbonate (OS-CAL) 1250 (500 Ca) MG chewable tablet Chew by mouth.   calcium carbonate (TUMS - DOSED IN MG ELEMENTAL CALCIUM) 500 MG chewable tablet Chew 2 tablets by mouth daily as needed for indigestion or heartburn.   Calcium Citrate-Vitamin D (CALCIUM + D PO) Take 1 capsule by mouth 2 (two) times daily.   Cholecalciferol (VITAMIN D3) 250 MCG (10000 UT) TABS Take 5,000 Units by mouth 2 (two) times daily.    Coenzyme Q10 (COQ-10) 100 MG CAPS Take 200 mg by mouth daily.   diltiazem (CARDIZEM) 30 MG tablet Take 1 tablet every 4 hours AS NEEDED for heart rate >100   guaiFENesin (MUCINEX) 600 MG 12 hr tablet Take 600 mg by mouth daily.   ivermectin (STROMECTOL) 3 MG TABS tablet Take 15 mg by mouth every  Sunday.   Krill Oil 1000 MG CAPS Take 1,000 mg by mouth daily.   levothyroxine (SYNTHROID) 25 MCG tablet Take 1 tablet (25 mcg total) by mouth daily.   loratadine (CLARITIN) 10 MG tablet Take 10 mg by mouth daily.   Misc Natural Products ([employer]S GLUCOS-CHONDROIT-MSM TS) TABS Take 2 tablets by mouth at bedtime.   Misc Natural Products (ELDERBERRY ZINC/VIT C/IMMUNE MT) Use as directed 2 each in the mouth or throat daily. Elderberry 100 mg Zinc 8 mg Vitamin C 90 mg   Multiple Vitamins-Minerals (CENTRUM SILVER PO) Take 1 tablet by mouth daily.    Omega-3 Fatty Acids (FISH OIL) 1000 MG CAPS Take 1,000 mg by mouth daily.   orlistat (ALLI) 60 MG capsule Take 60 mg by mouth 3 (three) times daily with meals.    OVER THE COUNTER MEDICATION Take 1,040 mg by mouth daily. Liposomal otc supplement   Probiotic Product (PROBIOTIC PO) Take 1 capsule by mouth daily.   pyridOXINE (VITAMIN B-6) 100 MG tablet Take 200 mg by mouth daily.   riTUXimab (RITUXAN) 500 MG/50ML injection Inject 1,000 mg into the vein See admin instructions. 2 infusions two weeks apart every 6 months.   Tixagevimab & Cilgavimab (EVUSHELD) 150 & 150 MG/1.5ML SOLN Inject into the muscle.   triamcinolone ointment (KENALOG) 0.5 % Apply 1 application topically 2 (two) times daily.   UNABLE TO FIND Take 2 capsules  by mouth daily. Med Name: Monolaurin 936 mg and Zinc 15 mg   valACYclovir (VALTREX) 500 MG tablet TAKE 1 TABLET(500 MG) BY MOUTH DAILY   venlafaxine XR (EFFEXOR-XR) 75 MG 24 hr capsule TAKE 1 CAPSULE(75 MG) BY MOUTH DAILY   zinc gluconate 50 MG tablet Take 50 mg by mouth every Monday, Wednesday, and Friday.   No facility-administered encounter medications on file as of 03/11/2021.   amLODipine (NORVASC) 2.5 MG tablet Last filled: 02/28/21 90 DS apixaban (ELIQUIS) 5 MG TABS tablet Last filled:03/02/10 90 DS Ascorbic Acid (VITAMIN C PO) Not listed  calcium carbonate (OS-CAL) 1250 (500 Ca) MG chewable tablet Not listed calcium carbonate (TUMS  - DOSED IN MG ELEMENTAL CALCIUM) 500 MG chewable tablet Not listed  Calcium Citrate-Vitamin D (CALCIUM + D PO) Not listed  Cholecalciferol (VITAMIN D3) 250 MCG (10000 UT) TABS Not listed  Coenzyme Q10 (COQ-10) 100 MG CAPS Not listed  diltiazem (CARDIZEM) 30 MG tablet Last filled:06/23/20 8 DS ivermectin (STROMECTOL) 3 MG TABS tablet Not listed levothyroxine (SYNTHROID) 25 MCG tablet Last filled: 02/17/21 33 DS loratadine (CLARITIN) 10 MG tablet Last filled: 07/15/19 90 DS orlistat (ALLI) 60 MG capsule not listed  pyridOXINE (VITAMIN B-6) 100 MG tablet Not listed  riTUXimab (RITUXAN) 500 MG/50ML injection Not listed.  Tixagevimab & Cilgavimab (EVUSHELD) 150 & 150 MG/1.5ML SOLN Not listed triamcinolone ointment (KENALOG) 0.5 % Last filled: 01/19/21 14 DS valACYclovir (VALTREX) 500 MG tablet Last filled: 02/15/21 90 DS venlafaxine XR (EFFEXOR-XR) 75 MG 24 hr capsule Last filled: 03/02/21 90 DS   Star Rating Drugs: None   Julia [name], CMA

## 2021-03-12 ENCOUNTER — Telehealth: Payer: Medicare Other

## 2021-03-15 NOTE — Progress Notes (Addendum)
Cardiology Office Note:    Date:  03/16/2021   ID:  Julia [name], DOB 05/12/1951, MRN [id number]  PCP:  [name], MD   [facility] Providers Cardiologist:  [name], MD      Referring MD: [name], MD   Chief Complaint:  F/u AFib    Patient Profile:    Julia [name] is a 70 y.o. female with:  Patent Foramen Ovale  S/p closure in 12/21 Paroxysmal atrial fibrillation  Rheumatoid arthritis Hx of TIA  Hypertension  Hx of bariatric surgery   Prior CV studies:  LIMITED ECHOCARDIOGRAM 07/09/2020  1. Left ventricular ejection fraction, by estimation, is 60 to 65%. The  left ventricle has normal function.   2. PFO device intact. No residual shunt.   3. The mitral valve was not assessed. No evidence of mitral valve  regurgitation.   4. The aortic valve is normal in structure. Aortic valve regurgitation is  not visualized.   PATENT FORAMEN OVALE(PFO) CLOSURE 07/09/2020 Narrative Successful transcatheter PFO closure using intracardiac echo and fluoroscopic guidance with a 25 mm Amplatzer PFO occluder device Recommend:  Resume apixaban tomorrow morning at normal dosing schedule, continue long-term in the setting of paroxysmal atrial fibrillation  Aspirin 81 mg daily x3 months  SBE prophylaxis x6 months when indicated  Same-day discharge after limited echo as long as no early complications arise  CARDIAC TELEMETRY MONITORING-INTERPRETATION ONLY 06/12/2020 Narrative 1. The basic rhythm is normal sinus with an average HR of 78 bpm 2. There are episodes of atrial fibrillation with RVR present, longest lasting 20 minutes, overall burden < 1% 3. No high-grade heart block or pathologic pauses 4. There are rare PVC's  CAC SCORE 2/25/20202 Coronary calcium score of 0.  GATED SPECT MYO PERF W/LEXISCAN STRESS 1D 07/27/2018 Narrative  Nuclear stress EF: 68%. The left ventricular ejection fraction is hyperdynamic (>65%).  There was no ST segment deviation noted  during stress.  The study is normal.  This is a low risk study. There is no evidence of ischemia or infarction   History of Present Illness: [name] was last seen by Angelena Form, PA-C in 07/2020 for post Patent Foramen Ovale closure f/u.  She returns for gen cardiology f/u on her atrial fibrillation.  She is here alone.  Overall, she has been doing well without chest pain or shortness of breath.  She was wondering if she could come off of anticoagulation.  She was not really aware that she had had atrial fibrillation identified on a previous monitor.        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   Osteoporosis 01/14/2017   07/2018 - spine  -2.3,  RFN  -1.9,  LFN  -2.3   - improvement in hips dexa 07/25/17 - spine  -3.1,   RFN  -2.2,  LFN  -2.4  - started on prolia DEXA  05/13/2010 in [place], osteopenia   Spine: -2.3   LFN  -1.7   RFN:  -1.8     FRAX  15%, 1.9%    Paroxysmal atrial fibrillation 09/29/2020   PFO with atrial septal aneurysm 06/19/2020   Plant dermatitis 01/08/2020   Pneumonia    Purpura 04/18/2017   Rheumatoid arthritis 01/13/2017   Following at [facility]ology  Has been on Prednisone, Plaquenil, Arava, Humira, etolodac, Enbrel, Orencia, Remicade and Rituxan     Sebaceous cyst 04/18/2017   TIA (transient ischemic attack) 04/22/2020   Ventral hernia without obstruction or gangrene 11/01/2019    Noted on Ct scan 10/2019   Vitamin D deficiency 06/22/2017    Current Medications: Current Meds  Medication Sig   amLODipine (NORVASC) 2.5 MG tablet Take 1 tablet (2.5 mg total) by mouth daily.   apixaban (ELIQUIS) 5 MG TABS tablet Take 1 tablet (5 mg total) by mouth 2 (two) times daily.   Ascorbic Acid (VITAMIN C PO) Take 3,200 mg by mouth daily. 1600 mg each   calcium carbonate (OS-CAL) 1250 (500 Ca) MG chewable tablet Chew by mouth.   calcium carbonate (TUMS - DOSED IN MG ELEMENTAL CALCIUM) 500 MG chewable tablet Chew 2 tablets by mouth daily as needed for indigestion or heartburn.   Calcium Citrate-Vitamin D (CALCIUM + D PO) Take 1 capsule by mouth 2 (two) times daily.   Cholecalciferol (VITAMIN D3) 250 MCG (10000 UT) TABS Take 5,000 Units by mouth 2 (two) times daily.    Coenzyme Q10 (COQ-10) 100 MG CAPS Take 200 mg by mouth daily.   guaiFENesin (MUCINEX) 600 MG 12 hr tablet Take 600 mg by mouth daily.   Krill Oil 1000 MG CAPS Take 1,000 mg by mouth daily.   levothyroxine (SYNTHROID) 25 MCG tablet Take 1 tablet (25 mcg total) by mouth daily.   loratadine (CLARITIN) 10 MG tablet Take 10 mg by mouth daily.   Misc Natural Products (CVS GLUCOS-CHONDROIT-MSM TS) TABS Take 2 tablets by mouth at bedtime.   Misc Natural Products (ELDERBERRY ZINC/VIT C/IMMUNE MT) Use as directed 2 each in the mouth or throat daily. Elderberry 100 mg Zinc 8 mg Vitamin C 90 mg   Multiple Vitamins-Minerals (CENTRUM SILVER PO) Take 1 tablet by mouth daily.    Omega-3 Fatty Acids (FISH OIL) 1000 MG CAPS Take 1,000 mg by mouth daily.   orlistat (ALLI) 60 MG capsule Take 60 mg by mouth 3 (three) times daily with meals.    OVER THE COUNTER MEDICATION Take 1,040 mg by mouth daily. Liposomal otc supplement   Probiotic Product (PROBIOTIC PO) Take 1 capsule by mouth daily.   pyridOXINE (VITAMIN B-6) 100 MG tablet Take 200 mg by mouth daily.   Tixagevimab & Cilgavimab (EVUSHELD) 150 & 150 MG/1.5ML SOLN Inject into the  muscle every 6 (six) months.   triamcinolone ointment (KENALOG) 0.5 % Apply 1 application topically 2 (two) times daily.   UNABLE TO FIND Take 2 capsules by mouth daily. Med Name: Monolaurin 936 mg and Zinc 15 mg   valACYclovir (VALTREX) 500 MG tablet TAKE 1 TABLET(500 MG) BY MOUTH DAILY   venlafaxine XR (EFFEXOR-XR) 75 MG 24 hr capsule TAKE 1 CAPSULE(75 MG) BY MOUTH DAILY   zinc gluconate 50 MG tablet Take 50 mg by mouth every Monday, Wednesday, and Friday.     Allergies:   Methotrexate derivatives   Social History   Tobacco Use   Smoking status: Never   Smokeless tobacco: Never  Substance Use Topics  Alcohol use: Yes    Alcohol/week: 3.0 - 5.0 standard drinks    Types: 3 - 5 Glasses of wine per week    Comment: per week   Drug use: Never     Family Hx: The patient's family history includes Gallstones in her father; Heart attack in her maternal grandmother; Heart disease in her mother; Heart failure in her mother; Hypertension in her brother, father, mother, and sister; Kidney Stones in her father; Ulcers in her father.  ROS   EKGs/Labs/Other Test Reviewed:    EKG:  EKG is not ordered today.  The ekg ordered today demonstrates n/a  Recent Labs: 09/29/2020: ALT 20; BUN 10; Creatinine, Ser 0.47; Hemoglobin 13.4; Platelets 278.0; Potassium 3.5; Sodium 142; TSH 1.13   Recent Lipid Panel Lab Results  Component Value Date/Time   CHOL 174 09/29/2020 12:21 PM   TRIG 61.0 09/29/2020 12:21 PM   HDL 90.50 09/29/2020 12:21 PM   LDLCALC 71 09/29/2020 12:21 PM      Risk Assessment/Calculations:    CHA2DS2-VASc Score = 5   This indicates a 7.2% annual risk of stroke. The patient's score is based upon: CHF History: No HTN History: Yes Diabetes History: No Stroke History: Yes Vascular Disease History: No Age Score: 1 Gender Score: 1         Physical Exam:    VS:  BP 122/80   Pulse 70   Ht '5\' 3"'$  (1.6 m)   Wt 171 lb 12.8 oz (77.9 kg)   SpO2 99%   BMI 30.43 kg/m      Wt Readings from Last 3 Encounters:  03/16/21 171 lb 12.8 oz (77.9 kg)  09/29/20 171 lb (77.6 kg)  08/25/20 173 lb (78.5 kg)     Physical Exam       ASSESSMENT & PLAN:    1. PAF (paroxysmal atrial fibrillation) (HCC) Maintaining sinus rhythm on exam.  She is tolerating anticoagulation.  We discussed the pathophysiology of atrial fibrillation and the reasons for her to remain on anticoagulation.  Continue apixaban at current dose.  I have given her the names of Pradaxa and Xarelto.  She can see if these are cheaper at her pharmacy.  Hemoglobin and creatinine were normal in March 2022.  Follow-up with Dr. [name] or me in 1 year.  2. PFO (patent foramen ovale) 3. S/P percutaneous patent foramen ovale closure She is now off of aspirin and remains on Apixaban.  She will follow-up with the structural heart clinic in several months.  4. Essential hypertension Well-controlled on current dose of amlodipine.  Continue current therapy.  5. History of TIA (transient ischemic attack) She is not on statin therapy.  LDL in March 2022 was 71.  Cholesterol is managed by primary care.  She has follow-up in several weeks.         Dispo:  Return in about 1 year (around 03/16/2022) for Routine Follow Up, w/ Dr. [name], or Richardson Dopp, PA-C.   Medication Adjustments/Labs and Tests Ordered: Current medicines are reviewed at length with the patient today.  Concerns regarding medicines are outlined above.  Tests Ordered: Orders Placed This Encounter  Procedures   ECHOCARDIOGRAM COMPLETE BUBBLE STUDY   ECHOCARDIOGRAM COMPLETE   ECHOCARDIOGRAM COMPLETE    Medication Changes: No orders of the defined types were placed in this encounter.   Signed, Richardson Dopp, PA-C  03/16/2021 4:57 PM    [facility] [address], [place], [place]  [address] Phone: [phone]; Fax: [phone]    

## 2021-03-16 ENCOUNTER — Encounter: Payer: Self-pay | Admitting: Physician Assistant

## 2021-03-16 ENCOUNTER — Other Ambulatory Visit: Payer: Self-pay

## 2021-03-16 ENCOUNTER — Ambulatory Visit (INDEPENDENT_AMBULATORY_CARE_PROVIDER_SITE_OTHER): Payer: Medicare Other | Admitting: Physician Assistant

## 2021-03-16 VITALS — BP 122/80 | HR 70 | Ht 63.0 in | Wt 171.8 lb

## 2021-03-16 DIAGNOSIS — I480 Paroxysmal atrial fibrillation: Secondary | ICD-10-CM | POA: Diagnosis not present

## 2021-03-16 DIAGNOSIS — Z8673 Personal history of transient ischemic attack (TIA), and cerebral infarction without residual deficits: Secondary | ICD-10-CM | POA: Diagnosis not present

## 2021-03-16 DIAGNOSIS — Q211 Atrial septal defect: Secondary | ICD-10-CM | POA: Diagnosis not present

## 2021-03-16 DIAGNOSIS — I10 Essential (primary) hypertension: Secondary | ICD-10-CM

## 2021-03-16 DIAGNOSIS — Q2112 Patent foramen ovale: Secondary | ICD-10-CM

## 2021-03-16 DIAGNOSIS — Z8774 Personal history of (corrected) congenital malformations of heart and circulatory system: Secondary | ICD-10-CM

## 2021-03-16 NOTE — Patient Instructions (Addendum)
Medication Instructions:  Continue your current medications.   *If you need a refill on your cardiac medications before your next appointment, please call your pharmacy*   Follow-Up: At [facility], you and your health needs are our priority.  As part of our continuing mission to provide you with exceptional heart care, we have created designated Provider Care Teams.  These Care Teams include your primary Cardiologist (physician) and Advanced Practice Providers (APPs -  Physician Assistants and Nurse Practitioners) who all work together to provide you with the care you need, when you need it.  We recommend signing up for the patient portal called "MyChart".  Sign up information is provided on this After Visit Summary.  MyChart is used to connect with patients for Virtual Visits (Telemedicine).  Patients are able to view lab/test results, encounter notes, upcoming appointments, etc.  Non-urgent messages can be sent to your provider as well.   To learn more about what you can do with MyChart, go to [email or web address].    Your next appointment:   1 year  The format for your next appointment:   In Person  Provider:   [name], MD or Richardson Dopp, PA-C   Other Instructions Ask you pharmacy about these 2 blood thinners to see if they are cheaper: Rivaroxaban (Xarelto) Dabigatran (Pradaxa)

## 2021-03-17 ENCOUNTER — Ambulatory Visit: Payer: Medicare Other | Admitting: Nurse Practitioner

## 2021-03-19 ENCOUNTER — Other Ambulatory Visit: Payer: Self-pay

## 2021-03-21 ENCOUNTER — Encounter: Payer: Self-pay | Admitting: Internal Medicine

## 2021-03-21 NOTE — Progress Notes (Signed)
Subjective:    Patient ID: Julia [name], female    DOB: 1950-10-01, 70 y.o.   MRN: [id number]  This visit occurred during the SARS-CoV-2 public health emergency.  Safety protocols were in place, including screening questions prior to the visit, additional usage of staff PPE, and extensive cleaning of exam room while observing appropriate contact time as indicated for disinfecting solutions.     HPI The patient is here for follow up of their chronic medical problems, including htn, afib, hypothyroidism, RA, anxiety/depression  She is experiencing some anxiety and depression.  She wonders about increasing the dose of her venlafaxine.  She is still adjusting to getting divorced, which is almost final.  She does feel overwhelmed at times with everything she needs to do.  Due for Prolia injection.  Medications and allergies reviewed with patient and updated if appropriate.  Patient Active Problem List   Diagnosis Date Noted   Paroxysmal atrial fibrillation 09/29/2020   Hyperglycemia 09/28/2020   PFO with atrial septal aneurysm 06/19/2020   TIA (transient ischemic attack) 04/22/2020   Adjustment disorder with mixed anxiety and depressed mood    Ventral hernia without obstruction or gangrene 11/01/2019   Hypertension 08/30/2019   Vitamin D deficiency 06/22/2017   Hemangioma 04/18/2017   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   Purpura 04/18/2017   Hypothyroidism 01/14/2017   History of tuberculosis exposure 01/14/2017   Osteoporosis - [facility], Dr Remer Macho 01/14/2017   History of gastric bypass 01/14/2017   History of colon polyps 01/14/2017   Rheumatoid arthritis - [facility], Dr Remer Macho 01/13/2017   GERD (gastroesophageal reflux disease) 01/13/2017    Current Outpatient Medications on File Prior to Visit  Medication Sig Dispense Refill   amLODipine (NORVASC) 2.5 MG tablet Take 1 tablet (2.5 mg total) by mouth daily. 90 tablet 3   apixaban (ELIQUIS) 5 MG TABS tablet Take 1 tablet (5  mg total) by mouth 2 (two) times daily. 180 tablet 2   Ascorbic Acid (VITAMIN C PO) Take 3,200 mg by mouth daily. 1600 mg each     calcium carbonate (OS-CAL) 1250 (500 Ca) MG chewable tablet Chew by mouth.     calcium carbonate (TUMS - DOSED IN MG ELEMENTAL CALCIUM) 500 MG chewable tablet Chew 2 tablets by mouth daily as needed for indigestion or heartburn.     Calcium Citrate-Vitamin D (CALCIUM + D PO) Take 1 capsule by mouth 2 (two) times daily.     Cholecalciferol (VITAMIN D3) 250 MCG (10000 UT) TABS Take 5,000 Units by mouth 2 (two) times daily.      Coenzyme Q10 (COQ-10) 100 MG CAPS Take 200 mg by mouth daily.     diltiazem (CARDIZEM) 30 MG tablet Take 1 tablet every 4 hours AS NEEDED for heart rate >100 45 tablet 1   guaiFENesin (MUCINEX) 600 MG 12 hr tablet Take 600 mg by mouth daily.     Krill Oil 1000 MG CAPS Take 1,000 mg by mouth daily.     levothyroxine (SYNTHROID) 25 MCG tablet Take 1 tablet (25 mcg total) by mouth daily. 90 tablet 1   loratadine (CLARITIN) 10 MG tablet Take 10 mg by mouth daily.     Misc Natural Products ([employer] GLUCOS-CHONDROIT-MSM TS) TABS Take 2 tablets by mouth at bedtime.     Misc Natural Products (ELDERBERRY ZINC/VIT C/IMMUNE MT) Use as directed 2 each in the mouth or throat daily. Elderberry 100 mg Zinc 8 mg Vitamin C 90 mg  Multiple Vitamins-Minerals (CENTRUM SILVER PO) Take 1 tablet by mouth daily.      Omega-3 Fatty Acids (FISH OIL) 1000 MG CAPS Take 1,000 mg by mouth daily.     orlistat (ALLI) 60 MG capsule Take 60 mg by mouth 3 (three) times daily with meals.      OVER THE COUNTER MEDICATION Take 1,040 mg by mouth daily. Liposomal otc supplement     Probiotic Product (PROBIOTIC PO) Take 1 capsule by mouth daily.     pyridOXINE (VITAMIN B-6) 100 MG tablet Take 200 mg by mouth daily.     riTUXimab (RITUXAN) 500 MG/50ML injection Inject 10 mg into the vein every 6 (six) months.     Tixagevimab & Cilgavimab (EVUSHELD) 150 & 150 MG/1.5ML SOLN Inject into the  muscle every 6 (six) months.     UNABLE TO FIND Take 2 capsules by mouth daily. Med Name: Monolaurin 936 mg and Zinc 15 mg     valACYclovir (VALTREX) 500 MG tablet TAKE 1 TABLET(500 MG) BY MOUTH DAILY 90 tablet 1   zinc gluconate 50 MG tablet Take 50 mg by mouth every Monday, Wednesday, and Friday.     [DISCONTINUED] venlafaxine XR (EFFEXOR-XR) 75 MG 24 hr capsule TAKE 1 CAPSULE(75 MG) BY MOUTH DAILY 90 capsule 1   No current facility-administered medications on file prior to visit.    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   Osteoporosis 01/14/2017   07/2018 - spine  -2.3,  RFN  -1.9,  LFN  -2.3   - improvement in hips dexa 07/25/17 - spine  -3.1,   RFN  -2.2,  LFN  -2.4  - started on prolia DEXA 05/13/2010 in [place], osteopenia   Spine: -2.3   LFN  -1.7   RFN:  -1.8     FRAX  15%, 1.9%    Paroxysmal atrial fibrillation 09/29/2020   PFO with atrial septal aneurysm 06/19/2020   Plant dermatitis 01/08/2020   Pneumonia    Purpura 04/18/2017   Rheumatoid arthritis 01/13/2017   Following at [facility] rheumatology  Has been on Prednisone, Plaquenil, Arava, Humira, etolodac, Enbrel, Orencia, Remicade and Rituxan     Sebaceous cyst 04/18/2017   TIA (transient ischemic attack) 04/22/2020   Ventral hernia without obstruction or gangrene 11/01/2019   Noted on Ct scan 10/2019   Vitamin D deficiency 06/22/2017    Past Surgical  History:  Procedure Laterality Date   APPENDECTOMY     BREAST ENHANCEMENT SURGERY     BUBBLE STUDY  05/27/2020   Procedure: BUBBLE STUDY;  Surgeon: [name], MD;  Location: [place];  Service: Cardiovascular;;   GASTRIC BYPASS     PATENT FORAMEN OVALE(PFO) CLOSURE N/A 06/19/2020   Procedure: PATENT FORAMEN OVALE (PFO) CLOSURE;  Surgeon: [name], MD;  Location: [facility];  Service: Cardiovascular;  Laterality: N/A;   TEE WITHOUT CARDIOVERSION N/A  05/27/2020   Procedure: TRANSESOPHAGEAL ECHOCARDIOGRAM (TEE);  Surgeon: [name], MD;  Location: [facility];  Service: Cardiovascular;  Laterality: N/A;   TONSILLECTOMY      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[employer]  Tobacco Use   Smoking status: Never   Smokeless tobacco: Never  Substance and Sexual Activity   Alcohol use: Yes    Alcohol/week: 3.0 - 5.0 standard drinks    Types: 3 - 5 Glasses of wine per week    Comment: per week   Drug use: Never   Sexual activity: Not on file  Other Topics Concern   Not on file  Social History Narrative   Retired 07/10/17 from [employer]   No regular exercise      Lives at home with husband   Caffeine: 2 glasses/day diet dr pepper   Social Determinants of Health   Financial Resource Strain: Not on file  Food Insecurity: Not on file  Transportation Needs: Not on file  Physical Activity: Not on file  Stress: Not on file  Social Connections: Not on file    Family History  Problem Relation Age of Onset   Hypertension Mother    Heart disease Mother    Heart failure Mother    Hypertension Father    Ulcers Father    Gallstones Father    Kidney Stones Father    Hypertension Sister    Hypertension Brother    Heart attack  Maternal Grandmother     Review of Systems  Constitutional:  Negative for appetite change, chills and fever.  HENT:  Positive for postnasal drip.   Respiratory:  Positive for cough (mild). Negative for shortness of breath and wheezing.   Cardiovascular:  Negative for chest pain, palpitations and leg swelling.  Gastrointestinal:  Negative for abdominal pain, constipation and diarrhea.  Neurological:  Positive for light-headedness (occ). Negative for headaches.  Psychiatric/Behavioral:  Negative for sleep disturbance.       Objective:   Vitals:   03/22/21 1112  BP: 126/72  Pulse: 63  Temp: 98.2 F (36.8 C)  SpO2: 98%   BP Readings from Last 3 Encounters:  03/22/21 126/72  03/16/21 122/80  09/29/20 128/70   Wt Readings from Last 3 Encounters:  03/22/21 171 lb (77.6 kg)  03/16/21 171 lb 12.8 oz (77.9 kg)  09/29/20 171 lb (77.6 kg)   Body mass index is 30.29 kg/m.   Physical Exam    Constitutional: Appears well-developed and well-nourished. No distress.  HENT:  Head: Normocephalic and atraumatic.  Neck: Neck supple. No tracheal deviation present. No thyromegaly present.  No cervical lymphadenopathy Cardiovascular: Normal rate, regular rhythm and normal heart sounds.   No murmur heard. No carotid bruit .  No edema Pulmonary/Chest: Effort normal and breath sounds normal. No respiratory distress. No has no wheezes. No rales. Abdomen: Soft, nontender, nondistended Skin: Skin is warm and dry. Not diaphoretic.  Psychiatric: Normal mood and affect. Behavior is normal.      Assessment & Plan:    See Problem List for Assessment and Plan of chronic medical problems.

## 2021-03-21 NOTE — Patient Instructions (Addendum)
  Blood work was ordered.     Prolia injection today.   Medications changes include :   venlafaxine to 150 mg daily  Your prescription(s) have been submitted to your pharmacy. Please take as directed and contact our office if you believe you are having problem(s) with the medication(s).    Please followup in 6 months

## 2021-03-22 ENCOUNTER — Ambulatory Visit (INDEPENDENT_AMBULATORY_CARE_PROVIDER_SITE_OTHER): Payer: Medicare Other | Admitting: Internal Medicine

## 2021-03-22 ENCOUNTER — Other Ambulatory Visit: Payer: Self-pay

## 2021-03-22 VITALS — BP 126/72 | HR 63 | Temp 98.2°F | Ht 63.0 in | Wt 171.0 lb

## 2021-03-22 DIAGNOSIS — I480 Paroxysmal atrial fibrillation: Secondary | ICD-10-CM

## 2021-03-22 DIAGNOSIS — R739 Hyperglycemia, unspecified: Secondary | ICD-10-CM

## 2021-03-22 DIAGNOSIS — Q2112 Patent foramen ovale: Secondary | ICD-10-CM

## 2021-03-22 DIAGNOSIS — M810 Age-related osteoporosis without current pathological fracture: Secondary | ICD-10-CM

## 2021-03-22 DIAGNOSIS — M069 Rheumatoid arthritis, unspecified: Secondary | ICD-10-CM

## 2021-03-22 DIAGNOSIS — E039 Hypothyroidism, unspecified: Secondary | ICD-10-CM

## 2021-03-22 DIAGNOSIS — F4323 Adjustment disorder with mixed anxiety and depressed mood: Secondary | ICD-10-CM

## 2021-03-22 DIAGNOSIS — I10 Essential (primary) hypertension: Secondary | ICD-10-CM | POA: Diagnosis not present

## 2021-03-22 DIAGNOSIS — Q211 Atrial septal defect: Secondary | ICD-10-CM

## 2021-03-22 DIAGNOSIS — Z8673 Personal history of transient ischemic attack (TIA), and cerebral infarction without residual deficits: Secondary | ICD-10-CM

## 2021-03-22 LAB — LIPID PANEL
Cholesterol: 181 mg/dL (ref 0–200)
HDL: 96.5 mg/dL (ref >39.00)
LDL Cholesterol: 73 mg/dL (ref 0–99)
NonHDL: 84.95
Total CHOL/HDL Ratio: 2
Triglycerides: 58 mg/dL (ref 0.0–149.0)
VLDL: 11.6 mg/dL (ref 0.0–40.0)

## 2021-03-22 LAB — VITAMIN D 25 HYDROXY (VIT D DEFICIENCY, FRACTURES): VITD: 60.9 ng/mL (ref 30.00–100.00)

## 2021-03-22 LAB — COMPREHENSIVE METABOLIC PANEL
ALT: 23 U/L (ref 0–35)
AST: 26 U/L (ref 0–37)
Albumin: 4.3 g/dL (ref 3.5–5.2)
Alkaline Phosphatase: 77 U/L (ref 39–117)
BUN: 11 mg/dL (ref 6–23)
CO2: 30 mEq/L (ref 19–32)
Calcium: 9.6 mg/dL (ref 8.4–10.5)
Chloride: 100 mEq/L (ref 96–112)
Creatinine, Ser: 0.49 mg/dL (ref 0.40–1.20)
GFR: 97.19 mL/min (ref >60.00)
Glucose, Bld: 83 mg/dL (ref 70–99)
Potassium: 3.6 mEq/L (ref 3.5–5.1)
Sodium: 139 mEq/L (ref 135–145)
Total Bilirubin: 0.7 mg/dL (ref 0.2–1.2)
Total Protein: 6.8 g/dL (ref 6.0–8.3)

## 2021-03-22 LAB — CBC WITH DIFFERENTIAL/PLATELET
Basophils Absolute: 0.1 10*3/uL (ref 0.0–0.1)
Basophils Relative: 1 % (ref 0.0–3.0)
Eosinophils Absolute: 0.1 10*3/uL (ref 0.0–0.7)
Eosinophils Relative: 2.3 % (ref 0.0–5.0)
HCT: 38.9 % (ref 36.0–46.0)
Hemoglobin: 12.7 g/dL (ref 12.0–15.0)
Lymphocytes Relative: 15.6 % (ref 12.0–46.0)
Lymphs Abs: 1 10*3/uL (ref 0.7–4.0)
MCHC: 32.6 g/dL (ref 30.0–36.0)
MCV: 96.6 fl (ref 78.0–100.0)
Monocytes Absolute: 0.8 10*3/uL (ref 0.1–1.0)
Monocytes Relative: 13.8 % — ABNORMAL HIGH (ref 3.0–12.0)
Neutro Abs: 4.1 10*3/uL (ref 1.4–7.7)
Neutrophils Relative %: 67.3 % (ref 43.0–77.0)
Platelets: 282 10*3/uL (ref 150.0–400.0)
RBC: 4.03 Mil/uL (ref 3.87–5.11)
RDW: 13.5 % (ref 11.5–15.5)
WBC: 6.1 10*3/uL (ref 4.0–10.5)

## 2021-03-22 LAB — TSH: TSH: 1.63 u[IU]/mL (ref 0.35–5.50)

## 2021-03-22 LAB — HEMOGLOBIN A1C: Hgb A1c MFr Bld: 5.3 % (ref 4.6–6.5)

## 2021-03-22 MED ORDER — DENOSUMAB 60 MG/ML ~~LOC~~ SOSY
60.0000 mg | PREFILLED_SYRINGE | Freq: Once | SUBCUTANEOUS | Status: AC
  Administered 2021-03-22: 60 mg via SUBCUTANEOUS

## 2021-03-22 MED ORDER — DENOSUMAB 60 MG/ML ~~LOC~~ SOSY: 60.0000 mg | PREFILLED_SYRINGE | Freq: Once | SUBCUTANEOUS | 0 refills | Status: AC

## 2021-03-22 MED ORDER — VENLAFAXINE HCL ER 150 MG PO CP24: 150.0000 mg | ORAL_CAPSULE | Freq: Every day | ORAL | 3 refills | Status: DC

## 2021-03-22 NOTE — Assessment & Plan Note (Signed)
Chronic  Clinically euthyroid Currently taking levothyroxine 25 mcg daily Check tsh  Titrate med dose if needed

## 2021-03-22 NOTE — Assessment & Plan Note (Signed)
Chronic Managed by Dr. Remer Macho at [facility] give Prolia here every 6 months and is more convenient-due for an injection today Need to continue Prolia on a every 6 month basis for osteoporosis She is very active Continue calcium, vitamin D Will check vitamin D level

## 2021-03-22 NOTE — Assessment & Plan Note (Signed)
Chronic Joint pain controlled-on half of her previous Rituxan dose and doing well Getting Evusheld every 6 months

## 2021-03-22 NOTE — Assessment & Plan Note (Signed)
Chronic Anxiety and depression not ideally controlled-this is related to her upcoming divorce Increase venlafaxine to 150 mg daily

## 2021-03-22 NOTE — Assessment & Plan Note (Signed)
Chronic Check a1c Low sugar / carb diet Stressed regular exercise  

## 2021-03-22 NOTE — Assessment & Plan Note (Signed)
Chronic Blood pressure well controlled Continue amlodipine 2.5 mg daily CMP

## 2021-03-22 NOTE — Assessment & Plan Note (Signed)
Chronic Paroxysmal-asymptomatic Following with cardiology On Eliquis 5 mg twice daily, rate controlled CBC, CMP, TSH

## 2021-03-23 ENCOUNTER — Encounter: Payer: Self-pay | Admitting: Internal Medicine

## 2021-04-06 ENCOUNTER — Other Ambulatory Visit: Payer: Self-pay | Admitting: Internal Medicine

## 2021-04-06 NOTE — Telephone Encounter (Signed)
VOB for Prolia initiated via [email or web address]

## 2021-04-08 IMAGING — CT CT RENAL STONE PROTOCOL
1 of 2 series · 14 of 32 positions shown, 19 images · non-contrast
Comparison: None.

CLINICAL DATA: Flank pain, kidney stone suspected on the right.
History of appendectomy and gastric bypass.

EXAM:
CT ABDOMEN AND PELVIS WITHOUT CONTRAST
TECHNIQUE: Multidetector CT imaging of the abdomen and pelvis was performed
following the standard protocol without IV contrast.

[Series 2: renal standard/full · axial · 0.80mm/px · z∈[-352,+48]mm · 14 of 90 slices shown, 19 images]
[im 5/90  soft-tissue]
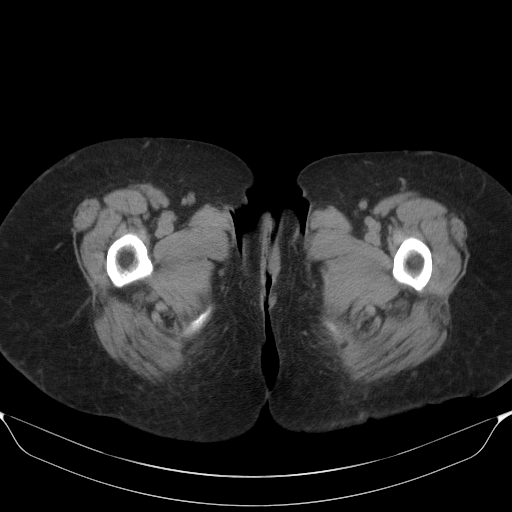
[im 5/90  bone]
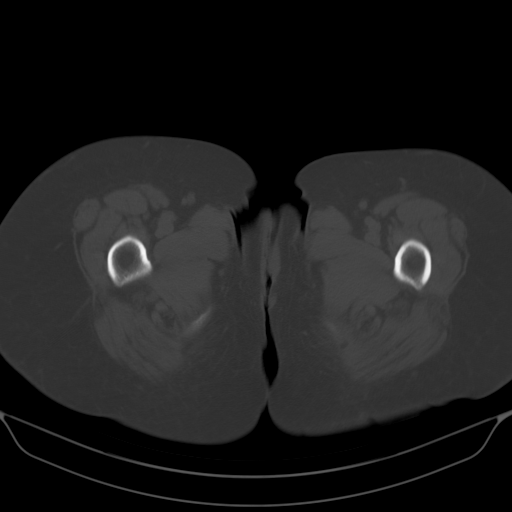
[im 13/90  soft-tissue]
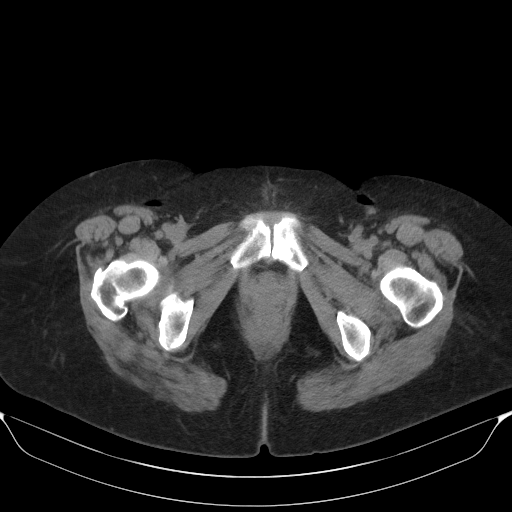
[im 17/90  soft-tissue]
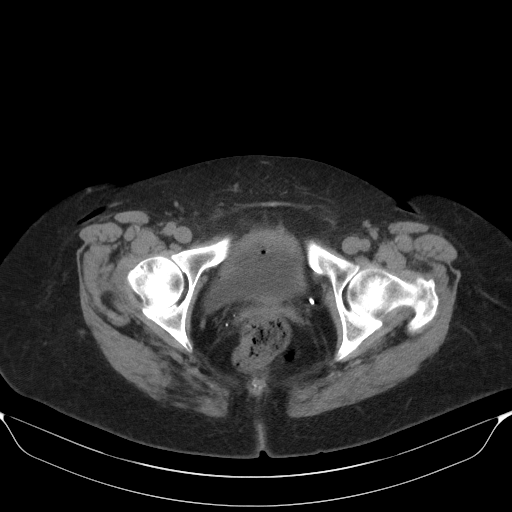
[im 26/90  soft-tissue]
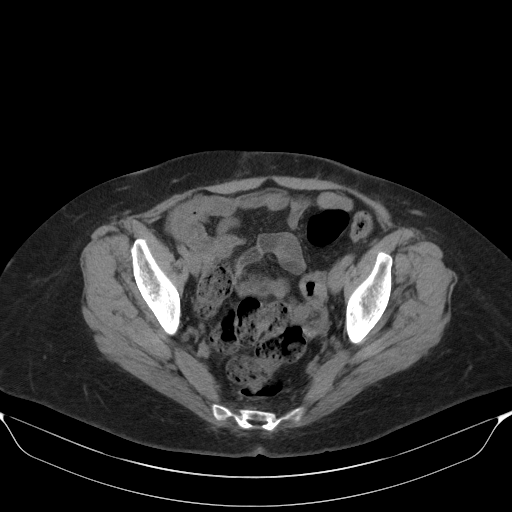
[im 30/90  soft-tissue]
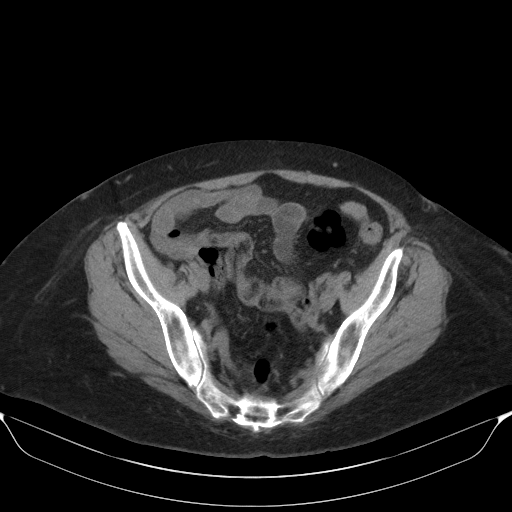
[im 39/90  soft-tissue]
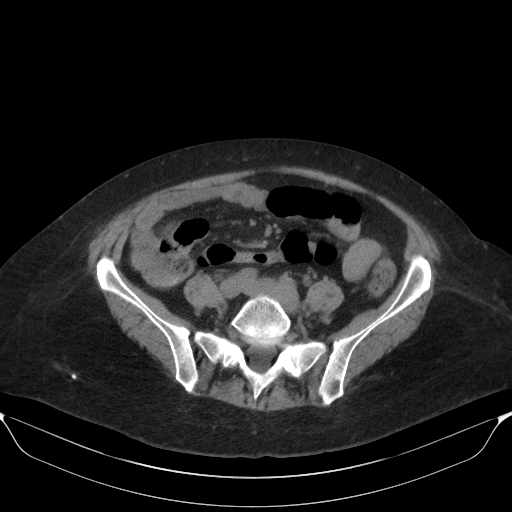
[im 47/90  soft-tissue]
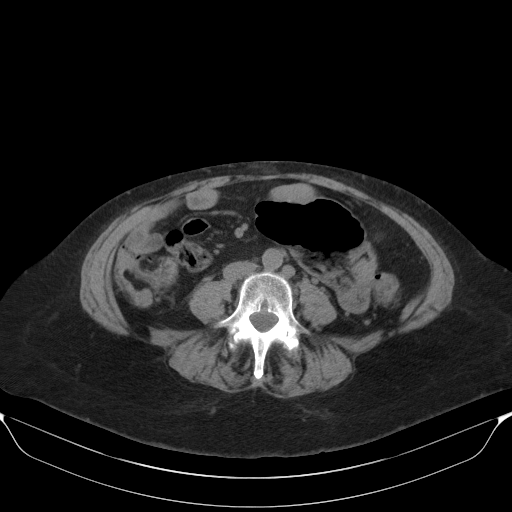
[im 51/90  soft-tissue]
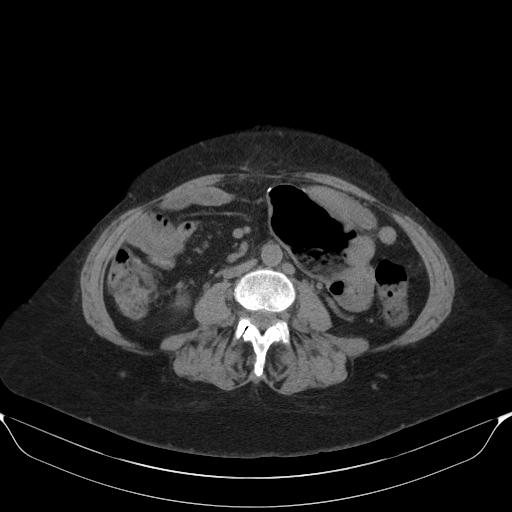
[im 60/90  soft-tissue]
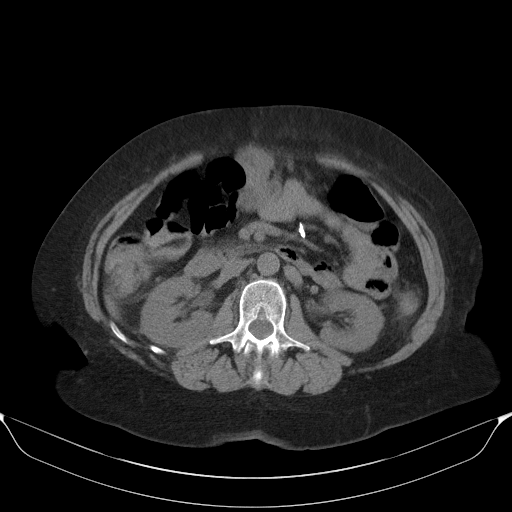
[im 60/90  bone]
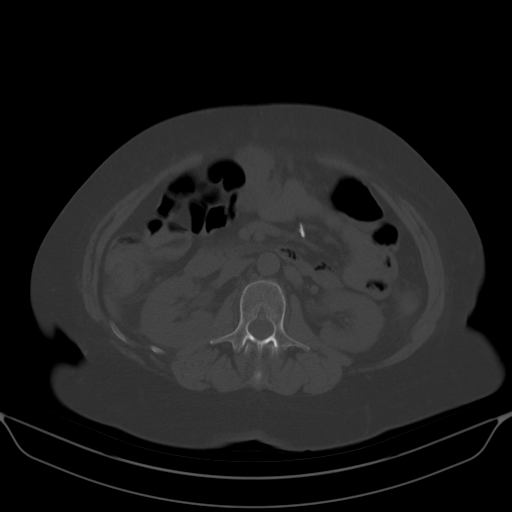
[im 64/90  soft-tissue]
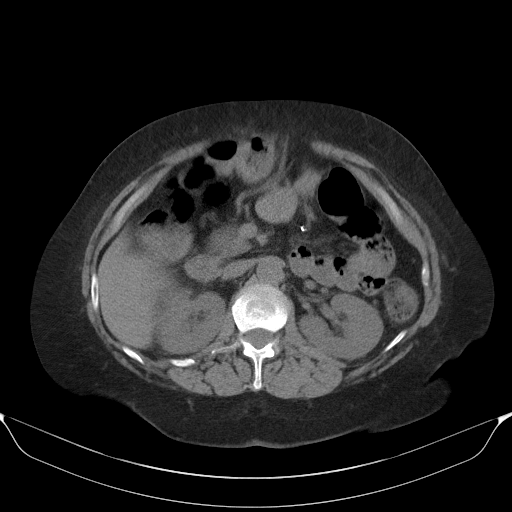
[im 73/90  soft-tissue]
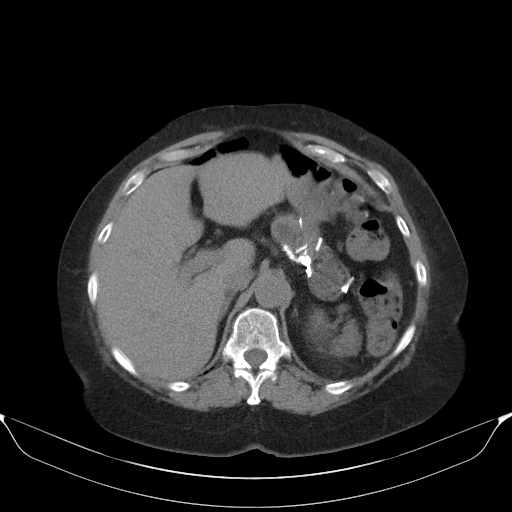
[im 73/90  lung]
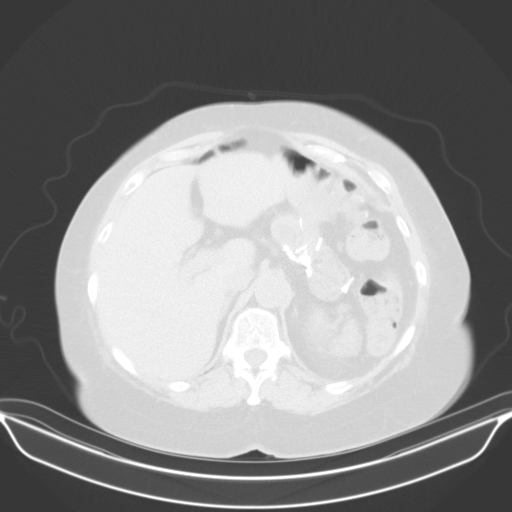
[im 77/90  soft-tissue]
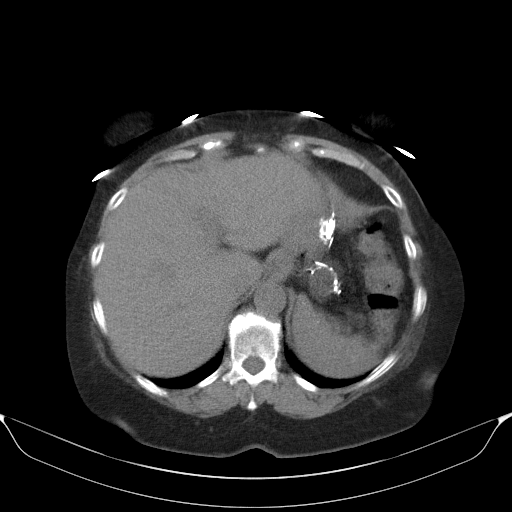
[im 77/90  lung]
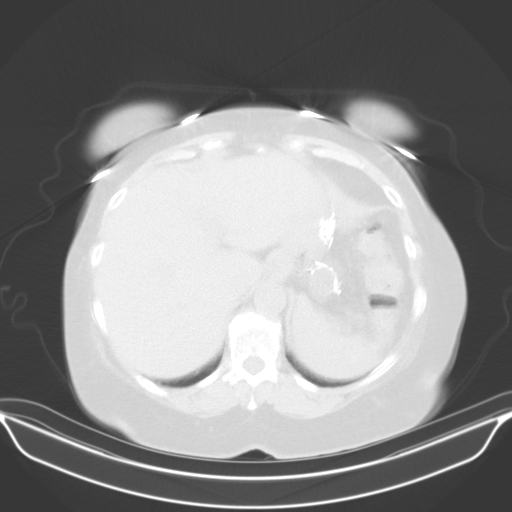
[im 81/90  lung]
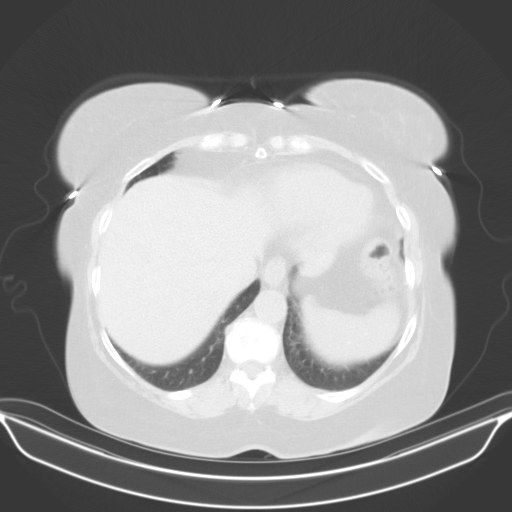
[im 85/90  soft-tissue]
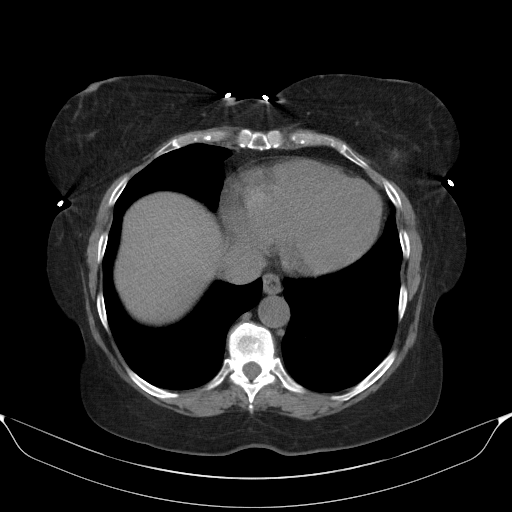
[im 85/90  lung]
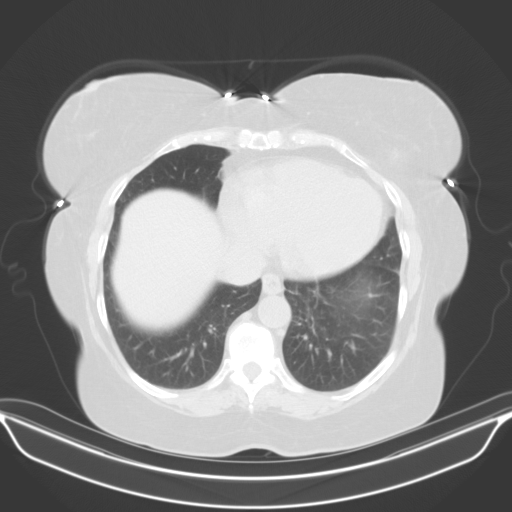

[14 of 32 positions shown; findings below may reference images not displayed]

FINDINGS: Lower chest: Partially visualized bilateral breast implants. Lung
bases are clear.

Somewhat limited evaluation of the abdominal viscera given lack of
IV contrast.

Hepatobiliary: No focal lesion identified in the liver. Status post
cholecystectomy.

Pancreas: Unremarkable.

Spleen: Normal in size. A few tiny calcifications may represent
sequela of prior granulomatous disease.

Adrenals/Urinary Tract: Adrenal glands are unremarkable. The kidneys
are symmetric in size. No large renal mass identified. No
hydronephrosis. No renal calculi are visualized. The urinary bladder
is mildly thick-walled but also decompressed. There are a few foci
of gas within the urinary bladder.

Stomach/Bowel: Surgical changes status post gastric bypass surgery.
The appendix is surgically absent. There is no evidence of bowel
obstruction or other inflammatory changes.

Vascular/Lymphatic: No significant vascular findings are present. No
enlarged abdominal or pelvic lymph nodes.

Reproductive: No adnexal masses.

Other: There is a small fat containing ventral hernia. A loop of
bowel partially enters the hernia.

Musculoskeletal: No acute or significant osseous findings.
IMPRESSION: 1. No evidence of renal calculi or hydronephrosis.
2. The urinary bladder is mildly thick-walled but also decompressed.
There are a few foci of gas within the urinary bladder. Correlate
with urinalysis to exclude cystitis.
3. Small fat containing ventral hernia. A loop of bowel partially
enters the hernia. No evidence of bowel obstruction or other
inflammatory changes.

## 2021-04-08 NOTE — Telephone Encounter (Signed)
Pt ready for scheduling on or after   Out-of-pocket cost due at time of visit: $0.00  Primary: Medicare Prolia co-insurance: 20% (approximately $255) Admin fee co-insurance: 20% (approximately $25)  Secondary: AARP Prolia co-insurance:  covers the Medicare Part B deductible, co-insurance and 100% of the excess charges Admin fee co-insurance:  covers the Medicare Part B deductible, co-insurance and 100% of the excess charges  Deductible: $233 of $233 met  Prior Auth: not required PA# Valid:   ** This summary of benefits is an estimation of the patient's out-of-pocket cost. Exact cost may vary based on individual plan coverage.

## 2021-04-08 NOTE — Telephone Encounter (Signed)
Next Prolia inj due 09/20/21

## 2021-04-19 ENCOUNTER — Ambulatory Visit: Payer: Medicare Other

## 2021-04-20 DIAGNOSIS — H40013 Open angle with borderline findings, low risk, bilateral: Secondary | ICD-10-CM | POA: Diagnosis not present

## 2021-04-20 DIAGNOSIS — H40033 Anatomical narrow angle, bilateral: Secondary | ICD-10-CM | POA: Diagnosis not present

## 2021-04-28 ENCOUNTER — Other Ambulatory Visit: Payer: Self-pay

## 2021-04-28 ENCOUNTER — Ambulatory Visit (INDEPENDENT_AMBULATORY_CARE_PROVIDER_SITE_OTHER): Payer: Medicare Other

## 2021-04-28 DIAGNOSIS — Z23 Encounter for immunization: Secondary | ICD-10-CM

## 2021-04-29 ENCOUNTER — Encounter: Payer: Self-pay | Admitting: Internal Medicine

## 2021-04-29 NOTE — Progress Notes (Signed)
Outside notes received. Information abstracted. Notes sent to scan.  

## 2021-05-04 DIAGNOSIS — Z23 Encounter for immunization: Secondary | ICD-10-CM | POA: Diagnosis not present

## 2021-05-20 ENCOUNTER — Telehealth: Payer: Self-pay | Admitting: Internal Medicine

## 2021-05-20 DIAGNOSIS — M0609 Rheumatoid arthritis without rheumatoid factor, multiple sites: Secondary | ICD-10-CM | POA: Diagnosis not present

## 2021-05-20 DIAGNOSIS — Z79899 Other long term (current) drug therapy: Secondary | ICD-10-CM | POA: Diagnosis not present

## 2021-05-20 DIAGNOSIS — Z5181 Encounter for therapeutic drug level monitoring: Secondary | ICD-10-CM | POA: Diagnosis not present

## 2021-05-20 DIAGNOSIS — D849 Immunodeficiency, unspecified: Secondary | ICD-10-CM | POA: Diagnosis not present

## 2021-05-20 NOTE — Telephone Encounter (Signed)
1.Medication Requested: amLODipine (NORVASC) 2.5 MG tablet  2. Pharmacy (Name, [place]): [facility] [phone] - [address]  3. On Med List: yes  4. Last Visit with PCP: 03-22-2021  5. Next visit date with PCP: 09-30-2021   Agent: Please be advised that RX refills may take up to 3 business days. We ask that you follow-up with your pharmacy.

## 2021-05-21 ENCOUNTER — Other Ambulatory Visit: Payer: Self-pay

## 2021-05-21 MED ORDER — AMLODIPINE BESYLATE 2.5 MG PO TABS: 2.5000 mg | ORAL_TABLET | Freq: Every day | ORAL | 3 refills | Status: DC

## 2021-05-21 NOTE — Telephone Encounter (Signed)
Sent in today 

## 2021-07-12 DIAGNOSIS — R051 Acute cough: Secondary | ICD-10-CM | POA: Diagnosis not present

## 2021-07-13 ENCOUNTER — Telehealth: Payer: Self-pay

## 2021-07-13 ENCOUNTER — Telehealth (INDEPENDENT_AMBULATORY_CARE_PROVIDER_SITE_OTHER): Payer: Medicare Other | Admitting: Family Medicine

## 2021-07-13 ENCOUNTER — Encounter: Payer: Self-pay | Admitting: Family Medicine

## 2021-07-13 ENCOUNTER — Other Ambulatory Visit: Payer: Self-pay | Admitting: Family Medicine

## 2021-07-13 ENCOUNTER — Telehealth: Payer: Medicare Other | Admitting: Internal Medicine

## 2021-07-13 ENCOUNTER — Telehealth: Payer: Self-pay | Admitting: Internal Medicine

## 2021-07-13 VITALS — Temp 97.4°F | Wt 163.0 lb

## 2021-07-13 DIAGNOSIS — R059 Cough, unspecified: Secondary | ICD-10-CM | POA: Diagnosis not present

## 2021-07-13 MED ORDER — AZITHROMYCIN 250 MG PO TABS: ORAL_TABLET | ORAL | 0 refills | Status: DC

## 2021-07-13 MED ORDER — BENZONATATE 100 MG PO CAPS: ORAL_CAPSULE | ORAL | 0 refills | Status: DC

## 2021-07-13 MED ORDER — ALBUTEROL SULFATE HFA 108 (90 BASE) MCG/ACT IN AERS: 2.0000 | INHALATION_SPRAY | Freq: Four times a day (QID) | RESPIRATORY_TRACT | 0 refills | Status: DC | PRN

## 2021-07-13 NOTE — Telephone Encounter (Signed)
Virtual apt scheduled today at 320

## 2021-07-13 NOTE — Progress Notes (Signed)
Virtual Visit via Video Note  I connected with Julia [name]  on 07/13/21 at  3:20 PM EST by a video enabled telemedicine application and verified that I am speaking with the correct person using two identifiers.  Location patient: Julia [name] Location provider:work or home office Persons participating in the virtual visit: patient, provider  I discussed the limitations and requested verbal permission for telemedicine visit. The patient expressed understanding and agreed to proceed.   HPI:  Acute telemedicine visit for cough and congestion: -Onset: about 7 days ago -niece in-law has been sick with the same and was diagnosed with URI -reports is immunocompromised -Symptoms include: cough, chest congestion, lots of sinus congestion and some sinus discomfort, some blood in the sinus drainage, this mucus that is discolored, occ SOB -she has used an inhaler in the past when sick -reports has had pneumonia multiple times -had 4 negative covid test, takes evushield -Denies: CP, SOB, NVD, inability to eat/drink/get out of bed -Has tried: OTC medications and nasal  -Pertinent past medical history: see below -Pertinent medication allergies: Allergies  Allergen Reactions   Methotrexate Derivatives Itching  -COVID-19 vaccine status:  Immunization History  Administered Date(s) Administered   Fluad Quad(high Dose 65+) 03/21/2019, 04/28/2021   Influenza, High Dose Seasonal PF 05/30/2018   Influenza-Unspecified 05/11/2017   PFIZER(Purple Top)SARS-COV-2 Vaccination 08/02/2019, 08/22/2019, 03/13/2021   PNEUMOCOCCAL CONJUGATE-20 04/28/2021   Pneumococcal Conjugate-13 07/17/2018   Pneumococcal Polysaccharide-23 04/07/2010, 10/29/2019   Tdap 10/19/2011   Zoster Recombinat (Shingrix) 07/25/2017, 10/10/2017   Zoster, Live 09/21/2005     ROS: See pertinent positives and negatives per HPI.  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   Osteoporosis 01/14/2017   07/2018 - spine  -2.3,  RFN  -1.9,  LFN  -2.3   - improvement in hips dexa 07/25/17 - spine  -3.1,   RFN  -2.2,  LFN  -2.4  - started on prolia DEXA 05/13/2010 in [place], osteopenia   Spine: -2.3   LFN  -1.7   RFN:  -1.8     FRAX  15%, 1.9%    Paroxysmal atrial fibrillation 09/29/2020   PFO with atrial septal aneurysm 06/19/2020   Plant dermatitis 01/08/2020   Pneumonia    Purpura 04/18/2017   Rheumatoid arthritis 01/13/2017   Following at [facility] rheumatology  Has been on Prednisone, Plaquenil, Arava, Humira, etolodac, Enbrel, Orencia, Remicade and Rituxan     Sebaceous cyst 04/18/2017   TIA (transient ischemic attack) 04/22/2020   Ventral hernia without obstruction or gangrene 11/01/2019   Noted on Ct scan 10/2019   Vitamin D deficiency 06/22/2017    Past Surgical History:  Procedure Laterality Date   APPENDECTOMY     BREAST ENHANCEMENT SURGERY     BUBBLE STUDY  05/27/2020   Procedure: BUBBLE STUDY;  Surgeon: [name], MD;  Location: [place];  Service: Cardiovascular;;   GASTRIC BYPASS  PATENT FORAMEN OVALE(PFO) CLOSURE N/A 06/19/2020   Procedure: PATENT FORAMEN OVALE (PFO) CLOSURE;  Surgeon: [name], MD;  Location: [place] CV LAB;  Service: Cardiovascular;  Laterality: N/A;   TEE WITHOUT CARDIOVERSION N/A 05/27/2020   Procedure: TRANSESOPHAGEAL ECHOCARDIOGRAM (TEE);  Surgeon: [name], MD;  Location: [place];  Service: Cardiovascular;  Laterality: N/A;   TONSILLECTOMY       Current Outpatient Medications:    albuterol (PROAIR HFA) 108 (90 Base) MCG/ACT inhaler, Inhale 2 puffs into the lungs every 6 (six) hours as needed for wheezing or shortness of breath., Disp: 1 each, Rfl: 0   amLODipine (NORVASC) 2.5 MG tablet, Take 1 tablet (2.5 mg total) by mouth daily., Disp: 90 tablet, Rfl: 3   apixaban (ELIQUIS) 5 MG TABS tablet, Take 1 tablet (5 mg total) by mouth 2 (two) times daily., Disp: 180 tablet, Rfl: 2   Ascorbic Acid (VITAMIN C PO), Take 3,200 mg by mouth daily. 1600 mg each, Disp: , Rfl:    azithromycin (ZITHROMAX) 250 MG tablet, 2 tabs day 1, then one tab daily, Disp: 6 tablet, Rfl: 0   benzonatate (TESSALON PERLES) 100 MG capsule, 1-2 up to twice daily as needed for cough, Disp: 30 capsule, Rfl: 0   calcium carbonate (OS-CAL) 1250 (500 Ca) MG chewable tablet, Chew by mouth., Disp: , Rfl:    calcium carbonate (TUMS - DOSED IN MG ELEMENTAL CALCIUM) 500 MG chewable tablet, Chew 2 tablets by mouth daily as needed for indigestion or heartburn., Disp: , Rfl:    Calcium Citrate-Vitamin D (CALCIUM + D PO), Take 1 capsule by mouth 2 (two) times daily., Disp: , Rfl:    Cholecalciferol (VITAMIN D3) 250 MCG (10000 UT) TABS, Take 5,000 Units by mouth 2 (two) times daily. , Disp: , Rfl:    Coenzyme Q10 (COQ-10) 100 MG CAPS, Take 200 mg by mouth daily., Disp: , Rfl:    diltiazem (CARDIZEM) 30 MG tablet, Take 1 tablet every 4 hours AS NEEDED for heart rate >100, Disp: 45 tablet, Rfl: 1   guaiFENesin (MUCINEX) 600 MG 12 hr tablet, Take 600 mg by mouth daily., Disp: , Rfl:    Krill Oil 1000 MG CAPS, Take 1,000 mg by mouth daily., Disp: , Rfl:    levothyroxine (SYNTHROID) 25 MCG tablet, TAKE 1 TABLET(25 MCG) BY MOUTH DAILY, Disp: 90 tablet, Rfl: 1   loratadine (CLARITIN) 10 MG tablet, Take 10 mg by mouth daily as needed., Disp: , Rfl:    Misc Natural Products ([employer] GLUCOS-CHONDROIT-MSM TS) TABS, Take 2  tablets by mouth at bedtime., Disp: , Rfl:    Misc Natural Products (ELDERBERRY ZINC/VIT C/IMMUNE MT), Use as directed 2 each in the mouth or throat daily. Elderberry 100 mg Zinc 8 mg Vitamin C 90 mg, Disp: , Rfl:    Multiple Vitamins-Minerals (CENTRUM SILVER PO), Take 1 tablet by mouth daily. , Disp: , Rfl:    Omega-3 Fatty Acids (FISH OIL) 1000 MG CAPS, Take 1,000 mg by mouth daily., Disp: , Rfl:    orlistat (ALLI) 60 MG capsule, Take 60 mg by mouth 3 (three) times daily with meals. , Disp: , Rfl:    OVER THE COUNTER MEDICATION, Take 1,040 mg by mouth daily. Liposomal otc supplement, Disp: , Rfl:    Probiotic Product (PROBIOTIC PO), Take 1 capsule by mouth daily., Disp: , Rfl:    pyridOXINE (VITAMIN B-6) 100 MG tablet, Take 200 mg by mouth daily., Disp: , Rfl:    riTUXimab (RITUXAN) 500  MG/50ML injection, Inject 10 mg into the vein every 6 (six) months., Disp: , Rfl:    Tixagevimab & Cilgavimab (EVUSHELD) 150 & 150 MG/1.5ML SOLN, Inject into the muscle every 6 (six) months., Disp: , Rfl:    UNABLE TO FIND, Take 2 capsules by mouth daily. Med Name: Monolaurin 936 mg and Zinc 15 mg, Disp: , Rfl:    valACYclovir (VALTREX) 500 MG tablet, TAKE 1 TABLET(500 MG) BY MOUTH DAILY, Disp: 90 tablet, Rfl: 1   venlafaxine XR (EFFEXOR-XR) 150 MG 24 hr capsule, Take 1 capsule (150 mg total) by mouth daily with breakfast., Disp: 90 capsule, Rfl: 3   zinc gluconate 50 MG tablet, Take 50 mg by mouth every Monday, Wednesday, and Friday., Disp: , Rfl:   EXAM:  VITALS per patient if applicable:  GENERAL: alert, oriented, appears well and in no acute distress  HEENT: atraumatic, conjunttiva clear, no obvious abnormalities on inspection of external nose and ears  NECK: normal movements of the head and neck  LUNGS: on inspection no signs of respiratory distress, breathing rate appears normal, no obvious gross SOB, gasping or wheezing  CV: no obvious cyanosis  MS: moves all visible extremities without  noticeable abnormality  PSYCH/NEURO: pleasant and cooperative, no obvious depression or anxiety, speech and thought processing grossly intact  ASSESSMENT AND PLAN:  Discussed the following assessment and plan:  Cough, unspecified type  -we discussed possible serious and likely etiologies, options for evaluation and workup, limitations of telemedicine visit vs in person visit, treatment, treatment risks and precautions. Pt is agreeable to treatment via telemedicine at this moment. Given worsening  with thick mucus, PMH and current overwhelmed UCCs and ER (she went to [facility] but the wait was too long) she has opted for empiric tx with Azithromycin (she prefers this abx after discussion options/risks), albuterol and a cough Rx.  Advised to seek prompt virtual visit or in person care if worsening, new symptoms arise, or if is not improving with treatment as expected per our conversation of expected course. Discussed options for follow up care. Did let this patient know that I do telemedicine on Tuesdays and Thursdays for [place] and those are the days I am logged into the system. Advised to schedule follow up visit with PCP, [place] virtual visits or UCC if any further questions or concerns to avoid delays in care.   I discussed the assessment and treatment plan with the patient. The patient was provided an opportunity to ask questions and all were answered. The patient agreed with the plan and demonstrated an understanding of the instructions.     Lucretia Kern, DO

## 2021-07-13 NOTE — Telephone Encounter (Signed)
Virtual appt was cancelled due to provider being sick,  Patient is requesting medication for an Upper respiratory Infection that has spread through 3 of her family members. They were prescribed a Zpack and prednisone and doing better. Patient has not been prescribed anything yet.  [facility] [employer]

## 2021-07-13 NOTE — Patient Instructions (Signed)
-  I sent the medication(s) we discussed to your pharmacy: Meds ordered this encounter  Medications   azithromycin (ZITHROMAX) 250 MG tablet    Sig: 2 tabs day 1, then one tab daily    Dispense:  6 tablet    Refill:  0   benzonatate (TESSALON PERLES) 100 MG capsule    Sig: 1-2 up to twice daily as needed for cough    Dispense:  30 capsule    Refill:  0   albuterol (PROAIR HFA) 108 (90 Base) MCG/ACT inhaler    Sig: Inhale 2 puffs into the lungs every 6 (six) hours as needed for wheezing or shortness of breath.    Dispense:  1 each    Refill:  0     I hope you are feeling better soon!  Seek in person care promptly if your symptoms worsen, new concerns arise or you are not improving with treatment.  It was nice to meet you today. I help [facility] out with telemedicine visits on Tuesdays and Thursdays and am happy to help if you need a virtual follow up visit on those days. Otherwise, if you have any concerns or questions following this visit please schedule a follow up visit with your Primary Care office or seek care at a local urgent care clinic to avoid delays in care

## 2021-07-13 NOTE — Telephone Encounter (Signed)
Connected to [facility] 1.2.2023.   Caller states she has a upper respiratory infection with cough with congestion in head and chest. (has had symptoms for 5 days).  Advised to see PCP within 24 hours.

## 2021-07-16 ENCOUNTER — Other Ambulatory Visit (HOSPITAL_COMMUNITY): Payer: Medicare Other

## 2021-07-16 ENCOUNTER — Encounter: Payer: Self-pay | Admitting: Physician Assistant

## 2021-07-16 ENCOUNTER — Ambulatory Visit (INDEPENDENT_AMBULATORY_CARE_PROVIDER_SITE_OTHER): Payer: Medicare Other | Admitting: Physician Assistant

## 2021-07-16 ENCOUNTER — Other Ambulatory Visit: Payer: Self-pay

## 2021-07-16 ENCOUNTER — Ambulatory Visit (HOSPITAL_COMMUNITY): Payer: Medicare Other | Attending: Cardiology

## 2021-07-16 VITALS — BP 130/80 | HR 70 | Ht 63.0 in | Wt 164.0 lb

## 2021-07-16 DIAGNOSIS — Q2112 Patent foramen ovale: Secondary | ICD-10-CM | POA: Diagnosis not present

## 2021-07-16 DIAGNOSIS — Z8774 Personal history of (corrected) congenital malformations of heart and circulatory system: Secondary | ICD-10-CM

## 2021-07-16 DIAGNOSIS — I480 Paroxysmal atrial fibrillation: Secondary | ICD-10-CM | POA: Insufficient documentation

## 2021-07-16 DIAGNOSIS — I10 Essential (primary) hypertension: Secondary | ICD-10-CM

## 2021-07-16 DIAGNOSIS — Z8673 Personal history of transient ischemic attack (TIA), and cerebral infarction without residual deficits: Secondary | ICD-10-CM | POA: Diagnosis not present

## 2021-07-16 LAB — ECHOCARDIOGRAM LIMITED BUBBLE STUDY
Area-P 1/2: 3.48 cm2
S' Lateral: 2.2 cm

## 2021-07-16 MED ORDER — SODIUM CHLORIDE 0.9% FLUSH
10.0000 mL | INTRAVENOUS | Status: AC | PRN
  Administered 2021-07-16: 20 mL via INTRAVENOUS

## 2021-07-16 MED ORDER — PERFLUTREN LIPID MICROSPHERE: 1.0000 mL | INTRAVENOUS | Status: DC | PRN

## 2021-07-16 NOTE — Patient Instructions (Signed)
Medication Instructions:  Your physician recommends that you continue on your current medications as directed. Please refer to the Current Medication list given to you today.  *If you need a refill on your cardiac medications before your next appointment, please call your pharmacy*   Lab Work: None ordered   If you have labs (blood work) drawn today and your tests are completely normal, you will receive your results only by: [place] (if you have MyChart) OR A paper copy in the mail If you have any lab test that is abnormal or we need to change your treatment, we will call you to review the results.   Testing/Procedures: None ordered    Follow-Up: At [facility], you and your health needs are our priority.  As part of our continuing mission to provide you with exceptional heart care, we have created designated Provider Care Teams.  These Care Teams include your primary Cardiologist (physician) and Advanced Practice Providers (APPs -  Physician Assistants and Nurse Practitioners) who all work together to provide you with the care you need, when you need it.  We recommend signing up for the patient portal called "MyChart".  Sign up information is provided on this After Visit Summary.  MyChart is used to connect with patients for Virtual Visits (Telemedicine).  Patients are able to view lab/test results, encounter notes, upcoming appointments, etc.  Non-urgent messages can be sent to your provider as well.   To learn more about what you can do with MyChart, go to [email or web address].    Your next appointment:   8 month(s)  The format for your next appointment:   In Person  Provider:   [name], MD     Other Instructions None

## 2021-07-16 NOTE — Progress Notes (Signed)
[facility] [place]                                     Cardiology Office Note:    Date:  07/16/2021   ID:  [name], DOB [birth year], MRN [id number]  PCP:  [name], MD  [facility] Cardiologist: Dr. Algernon [facility] Electrophysiologist:  None   Referring MD: [name], MD   1 month s/p PFO closure   History of Present Illness:    Julia [name] is a 71 y.o. female with a hx of RA on Rituxin, PAF on Eliquis, TIAs, and PFO s/p PFO closure (06/19/20) who presents to clinic for follow up.  Pt was in her usual state of health until 02/2020 when she had sudden onset of severe dizziness and expressive aphasia that lasted about 6 hours. The following month she had another amnestic event. There was concern that she is having TIA's and an echo bubble study showed evidence of right-to-left shunt. The patient had evaluation with an MRI of the brain that showed no acute abnormality.  CTA showed no large vessel atherosclerosis or obstruction. She was referred to Dr. [name] for consideration of PFO closure. Subsequent cardiac monitor showed PAF and she was started on Eliquis. TEE 05/17/20 showed an aneurysmal interatrial septum and a moderate PFO. The patient had a strong desire for PFO closure despite being diagnosed with PAF and being started on [place].   She underwent successful transcatheter PFO closure a 25 mm Amplatzer PFO occluder device on 06/19/20. Post op echo showed EF 60%, normally functioning PFO occluder with no residual shunting. She was resumed on Eliquis with the addition of a baby aspirin x 3 months.  Today she presents to clinic for follow up. No CP or SOB. No LE edema, orthopnea or PND. No dizziness or syncope. No blood in stool or urine. No palpitations.  She has felt a little depressed and lonely recently after issues with infidelity with her husband. Her divorce was finalized on 07/07/21 which brings her more peace.   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   Osteoporosis 01/14/2017   07/2018 - spine  -2.3,  RFN  -1.9,  LFN  -2.3   - improvement in hips dexa 07/25/17 - spine  -3.1,   RFN  -2.2,  LFN  -2.4  - started on prolia DEXA 05/13/2010 in [place], osteopenia   Spine: -2.3   LFN  -1.7   RFN:  -1.8     FRAX  15%, 1.9%    Paroxysmal atrial fibrillation 09/29/2020   PFO with atrial septal aneurysm 06/19/2020   Plant dermatitis 01/08/2020   Pneumonia    Purpura 04/18/2017   Rheumatoid arthritis 01/13/2017   Following at [facility]ology  Has been on Prednisone, Plaquenil, Arava, Humira, etolodac, Enbrel, Orencia, Remicade and Rituxan     Sebaceous cyst 04/18/2017   TIA (transient ischemic attack) 04/22/2020   Ventral hernia without obstruction or gangrene 11/01/2019   Noted on Ct scan 10/2019   Vitamin D deficiency 06/22/2017    Past Surgical History:  Procedure Laterality Date   APPENDECTOMY     BREAST ENHANCEMENT SURGERY     BUBBLE STUDY  05/27/2020   Procedure: BUBBLE STUDY;  Surgeon: [name], MD;  Location: [place];  Service: Cardiovascular;;   GASTRIC BYPASS     PATENT FORAMEN OVALE(PFO) CLOSURE N/A 06/19/2020   Procedure: PATENT FORAMEN OVALE (PFO) CLOSURE;  Surgeon: [name], MD;  Location: [place] [facility];  Service: Cardiovascular;  Laterality: N/A;   TEE WITHOUT CARDIOVERSION N/A 05/27/2020    Procedure: TRANSESOPHAGEAL ECHOCARDIOGRAM (TEE);  Surgeon: [name], MD;  Location: [place];  Service: Cardiovascular;  Laterality: N/A;   TONSILLECTOMY      Current Medications: Current Meds  Medication Sig   albuterol (VENTOLIN HFA) 108 (90 Base) MCG/ACT inhaler INHALE 2 PUFFS INTO THE LUNGS EVERY 6 HOURS AS NEEDED FOR WHEEZING OR SHORTNESS OF BREATH   amLODipine (NORVASC) 2.5 MG tablet Take 1 tablet (2.5 mg total) by mouth daily.   apixaban (ELIQUIS) 5 MG TABS tablet Take 1 tablet (5 mg total) by mouth 2 (two) times daily.   Ascorbic Acid (VITAMIN C PO) Take 3,200 mg by mouth daily. 1600 mg each   calcium carbonate (OS-CAL) 1250 (500 Ca) MG chewable tablet Chew by mouth.   calcium carbonate (TUMS - DOSED IN MG ELEMENTAL CALCIUM) 500 MG chewable tablet Chew 2 tablets by mouth daily as needed for indigestion or heartburn.   Calcium Citrate-Vitamin D (CALCIUM + D PO) Take 1 capsule by mouth 2 (two) times daily.   Cholecalciferol (VITAMIN D3) 250 MCG (10000 UT) TABS Take 5,000 Units by mouth 2 (two) times daily.    Coenzyme Q10 (COQ-10) 100 MG CAPS Take 200 mg by mouth daily.   diltiazem (CARDIZEM) 30 MG tablet Take 1 tablet every 4 hours AS NEEDED for heart rate >100   guaiFENesin (MUCINEX) 600 MG 12 hr tablet Take 600 mg by mouth daily.   Krill Oil 1000 MG CAPS Take 1,000 mg by mouth daily.   levothyroxine (SYNTHROID) 25 MCG tablet TAKE 1 TABLET(25 MCG) BY MOUTH DAILY   loratadine (CLARITIN) 10 MG tablet Take 10 mg by mouth daily as needed.   Misc Natural Products ([employer] GLUCOS-CHONDROIT-MSM TS) TABS Take 2 tablets by mouth at bedtime.   Misc Natural Products (ELDERBERRY ZINC/VIT C/IMMUNE MT) Use as directed 2 each in the mouth or throat daily. Elderberry 100 mg Zinc 8 mg Vitamin C 90 mg   Multiple Vitamins-Minerals (CENTRUM SILVER PO) Take 1 tablet by mouth daily.    Omega-3 Fatty Acids (FISH OIL) 1000 MG CAPS Take 1,000 mg by mouth daily.   orlistat (ALLI) 60 MG capsule  Take 60 mg by mouth 3 (three) times daily with meals.    OVER THE COUNTER MEDICATION Take 1,040 mg by mouth daily. Liposomal otc supplement   Probiotic Product (PROBIOTIC PO) Take 1 capsule by mouth daily.   pyridOXINE (VITAMIN B-6) 100 MG tablet Take 200 mg by mouth daily.   riTUXimab (RITUXAN) 500 MG/50ML injection Inject 10 mg into the vein every 6 (six) months.   Tixagevimab & Cilgavimab (EVUSHELD) 150 & 150 MG/1.5ML SOLN Inject into the muscle every 6 (six) months.  UNABLE TO FIND Take 2 capsules by mouth daily. Med Name: Monolaurin 936 mg and Zinc 15 mg   valACYclovir (VALTREX) 500 MG tablet TAKE 1 TABLET(500 MG) BY MOUTH DAILY   venlafaxine XR (EFFEXOR-XR) 150 MG 24 hr capsule Take 1 capsule (150 mg total) by mouth daily with breakfast.   zinc gluconate 50 MG tablet Take 50 mg by mouth every Monday, Wednesday, and Friday.     Allergies:   Methotrexate derivatives   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[employer]  Tobacco Use   Smoking status: Never   Smokeless tobacco: Never  Substance and Sexual Activity   Alcohol use: Yes    Alcohol/week: 3.0 - 5.0 standard drinks    Types: 3 - 5 Glasses of wine per week    Comment: per week   Drug use: Never   Sexual activity: Not on file  Other Topics Concern   Not on file  Social History Narrative   Retired 07/10/17 from [employer]   No regular exercise      Lives at home with husband   Caffeine: 2 glasses/day diet dr pepper   Social Determinants of Health   Financial Resource Strain: Not on file  Food Insecurity: Not on file  Transportation Needs: Not on file  Physical Activity: Not on file  Stress: Not on file  Social Connections: Not on file     Family History: The patient's family history includes  Gallstones in her father; Heart attack in her maternal grandmother; Heart disease in her mother; Heart failure in her mother; Hypertension in her brother, father, mother, and sister; Kidney Stones in her father; Ulcers in her father.  ROS:   Please see the history of present illness.    All other systems reviewed and are negative.  EKGs/Labs/Other Studies Reviewed:    The following studies were reviewed today:  06/19/20 PATENT FORAMEN OVALE (PFO) CLOSURE  Conclusion Successful transcatheter PFO closure using intracardiac echo and fluoroscopic guidance with a 25 mm Amplatzer PFO occluder device   Recommend: Resume apixaban tomorrow morning at normal dosing schedule, continue long-term in the setting of paroxysmal atrial fibrillation Aspirin 81 mg daily x3 months SBE prophylaxis x6 months when indicated Same-day discharge after limited echo as long as no early complications arise  ___________________  06/19/20 IMPRESSIONS   1. Left ventricular ejection fraction, by estimation, is 60 to 65%. The  left ventricle has normal function.   2. PFO device intact. No residual shunt.   3. The mitral valve was not assessed. No evidence of mitral valve  regurgitation.   4. The aortic valve is normal in structure. Aortic valve regurgitation is  not visualized.   _____________________  Echo bubble 07/16/21 IMPRESSION  1. Patient is s/p PFO closure with 79mm Amplatzer PFO occluder device on 06/19/2020. There is no residual shunting on agitated saline contrast study.  2. Left ventricular ejection fraction, by estimation, is 60 to 65%. Left ventricular ejection fraction by 3D volume is 62 %. The left ventricle has normal function. The left ventricle has no regional wall motion abnormalities. Left ventricular diastolic  parameters are consistent with Grade I diastolic dysfunction (impaired relaxation). The average left ventricular global longitudinal strain is -26.2 %. The global longitudinal strain  is normal.  3. Right ventricular systolic function is normal. The right ventricular size is normal.  4. The mitral valve is normal in structure. Trivial mitral valve regurgitation.  5. The inferior vena cava is normal in size with greater than 50% respiratory variability, suggesting right atrial pressure of 3 mmHg.  6. Agitated saline contrast bubble study was negative, with no evidence of any interatrial shunt.   Comparison(s): Compared to prior TTE on 06/19/20, the patient is s/p PFO closure with negative bubble study currently.  EKG:  EKG is ordered today and demonstrates sinus HR 70  Recent Labs: 03/22/2021: ALT 23; BUN 11; Creatinine, Ser 0.49; Hemoglobin 12.7; Platelets 282.0; Potassium 3.6; Sodium 139; TSH 1.63  Recent Lipid Panel    Component Value Date/Time   CHOL 181 03/22/2021 1210   TRIG 58.0 03/22/2021 1210   HDL 96.50 03/22/2021 1210   CHOLHDL 2 03/22/2021 1210   VLDL 11.6 03/22/2021 1210   LDLCALC 73 03/22/2021 1210     Risk Assessment/Calculations:     CHA2DS2-VASc Score = 5  This indicates a 7.2% annual risk of stroke. The patient's score is based upon: CHF History: 0 HTN History: 1 Diabetes History: 0 Stroke History: 2 Vascular Disease History: 0 Age Score: 1 Gender Score: 1    Physical Exam:    VS:  BP 130/80 (BP Location: Left Arm, Patient Position: Sitting, Cuff Size: Normal)    Pulse 70    Ht 5\' 3"  (1.6 m)    Wt 164 lb (74.4 kg)    SpO2 97%    BMI 29.05 kg/m     Wt Readings from Last 3 Encounters:  07/16/21 164 lb (74.4 kg)  07/13/21 163 lb (73.9 kg)  03/22/21 171 lb (77.6 kg)     GEN:  Well nourished, well developed in no acute distress HEENT: Normal NECK: No JVD; No carotid bruits LYMPHATICS: No lymphadenopathy CARDIAC: RRR, no murmurs, rubs, gallops RESPIRATORY:  Clear to auscultation without rales, wheezing or rhonchi  ABDOMEN: Soft, non-tender, non-distended MUSCULOSKELETAL:  No edema; No deformity  SKIN: Warm and dry NEUROLOGIC:   Alert and oriented x 3 PSYCHIATRIC:  Normal affect   ASSESSMENT:    1. S/P percutaneous patent foramen ovale closure   2. Essential hypertension   3. PAF (paroxysmal atrial fibrillation) (HCC)     PLAN:    In order of problems listed above:  PFO s/p percutaneous PFO closure: echo today shows EF 60-65%, s/p PFO closure with 85mm Amplatzer PFO occluder device with no residual shunting on agitated saline contrast study. She will continue on Eliquis 5mg  BID.  HTN: Bp well controlled. No changes made.   PAF: ECG shows sinus today. Continue on Eliquis 5mg  bid.    Medication Adjustments/Labs and Tests Ordered: Current medicines are reviewed at length with the patient today.  Concerns regarding medicines are outlined above.  Orders Placed This Encounter  Procedures   EKG 12-Lead   No orders of the defined types were placed in this encounter.   Patient Instructions  Medication Instructions:  Your physician recommends that you continue on your current medications as directed. Please refer to the Current Medication list given to you today.  *If you need a refill on your cardiac medications before your next appointment, please call your pharmacy*   Lab Work: None ordered   If you have labs (blood work) drawn today and your tests are completely normal, you will receive your results only by: [place] (if you have MyChart) OR A  paper copy in the mail If you have any lab test that is abnormal or we need to change your treatment, we will call you to review the results.   Testing/Procedures: None ordered    Follow-Up: At [facility], you and your health needs are our priority.  As part of our continuing mission to provide you with exceptional heart care, we have created designated Provider Care Teams.  These Care Teams include your primary Cardiologist (physician) and Advanced Practice Providers (APPs -  Physician Assistants and Nurse Practitioners) who all work together to  provide you with the care you need, when you need it.  We recommend signing up for the patient portal called "MyChart".  Sign up information is provided on this After Visit Summary.  MyChart is used to connect with patients for Virtual Visits (Telemedicine).  Patients are able to view lab/test results, encounter notes, upcoming appointments, etc.  Non-urgent messages can be sent to your provider as well.   To learn more about what you can do with MyChart, go to [email or web address].    Your next appointment:   8 month(s)  The format for your next appointment:   In Person  Provider:   [name], MD     Other Instructions None     Signed, Angelena Form, PA-C  07/16/2021 2:06 PM    [place]

## 2021-08-09 ENCOUNTER — Telehealth: Payer: Self-pay

## 2021-08-09 ENCOUNTER — Other Ambulatory Visit: Payer: Self-pay

## 2021-08-09 MED ORDER — VALACYCLOVIR HCL 500 MG PO TABS: ORAL_TABLET | ORAL | 1 refills | Status: DC

## 2021-08-09 NOTE — Telephone Encounter (Signed)
Pt calling in to requesting a refill on: valACYclovir (VALTREX) 500 MG tablet  Pharmacy: [facility] [place], [place] - [address] AT [address]  LOV 03/22/21  Pt CB [phone]

## 2021-08-19 ENCOUNTER — Telehealth: Payer: Self-pay | Admitting: Adult Health

## 2021-08-19 NOTE — Telephone Encounter (Signed)
..   Pt understands that although there may be some limitations with this type of visit, we will take all precautions to reduce any security or privacy concerns.  Pt understands that this will be treated like an in office visit and we will file with pt's insurance, and there may be a patient responsible charge related to this service. ? ?

## 2021-08-23 NOTE — Telephone Encounter (Signed)
VOB initiated for Julia [name]

## 2021-08-24 ENCOUNTER — Telehealth (INDEPENDENT_AMBULATORY_CARE_PROVIDER_SITE_OTHER): Payer: Medicare Other | Admitting: Adult Health

## 2021-08-24 ENCOUNTER — Encounter: Payer: Self-pay | Admitting: Adult Health

## 2021-08-24 DIAGNOSIS — Q2112 Patent foramen ovale: Secondary | ICD-10-CM

## 2021-08-24 DIAGNOSIS — G459 Transient cerebral ischemic attack, unspecified: Secondary | ICD-10-CM

## 2021-08-24 DIAGNOSIS — I480 Paroxysmal atrial fibrillation: Secondary | ICD-10-CM | POA: Diagnosis not present

## 2021-08-24 NOTE — Patient Instructions (Signed)
Continue Eliquis (apixaban) daily  and omega 3  for secondary stroke prevention  Continue to follow up with PCP regarding cholesterol and blood pressure management  Maintain strict control of hypertension with blood pressure goal below 130/90 and cholesterol with LDL cholesterol (bad cholesterol) goal below 70 mg/dL.   Signs of a Stroke? Follow the BEFAST method:  Balance Watch for a sudden loss of balance, trouble with coordination or vertigo Eyes Is there a sudden loss of vision in one or both eyes? Or double vision?  Face: Ask the person to smile. Does one side of the face droop or is it numb?  Arms: Ask the person to raise both arms. Does one arm drift downward? Is there weakness or numbness of a leg? Speech: Ask the person to repeat a simple phrase. Does the speech sound slurred/strange? Is the person confused ? Time: If you observe any of these signs, call 911.      Thank you for coming to see [place] at [facility]. I hope we have been able to provide you high quality care today.  You may receive a patient satisfaction survey over the next few weeks. We would appreciate your feedback and comments so that we may continue to improve ourselves and the health of our patients.

## 2021-08-24 NOTE — Progress Notes (Signed)
[id number]    Provider:  Dr [name] Requesting Provider: [name], MD Primary Care Provider:  [name], MD   Virtual Visit via Video Note  Virtual visit completed through MyChart, a video enabled telemedicine application. Due to national recommendations of social distancing due to COVID-19, a virtual visit is felt to be most appropriate for this patient at this time. Reviewed limitations, risks, security and privacy concerns of performing a virtual visit and the availability of in person appointments. I also reviewed that there may be a patient responsible charge related to this service. The patient agreed to proceed.    Patient location: home Provider location: in office, [facility] Persons participating in this virtual visit: patient and provider       CC:  TIA f/u   HPI:   Update 08/24/2021 JM: Returns for 1 year follow-up regarding TIA via MyChart video visit.  Overall stable from stroke standpoint without new or reoccurring stroke/TIA symptoms.  Compliant on Eliquis and omega-3 without side effects.  Blood pressure not monitored at home but stable at appointments. Recently had f/u with cardiology with repeat echo which showed no residual shunting from prior PFO closure. Scheduled with Dr. Quay Burow for annual exam and repeat labs next month. No new concerns at this time.     History provided for reference purposes only Update 08/25/2020 JM: Julia [name] returns for 58-month follow-up regarding likely TIA.  Since prior visit, she completed further evaluation for likely multiple TIA events with 30-day cardiac event monitor finding atrial fibrillation and 2D echo showing evidence of PFO which was confirmed on TEE by Dr. [name].  She had appropriate follow-up with cardiology and has been started on Eliquis 5 mg twice daily as well as underwent successful PFO closure by Dr. [name] on 06/19/2020 with use of aspirin for 3 months.  She denies  any additional stroke/TIA symptoms Reports compliance with Eliquis 5 mg twice daily and aspirin 81mg  daily tolerating without side effects Currently on omega-3 fatty acid for cholesterol management with prior LDL 83 (08/2019) -she plans on scheduling follow-up visit with PCP in the near future for repeat lipid panel Blood pressure today 128/74   No concerns at this time  Initial consult visit 04/22/2020 Dr. [name] (copy for reference purposes only)  Julia [name] is a 71 y.o. female here as requested by [name], MD for TIA versus BPPV vs complicated migraine(patient denies having a headache that day and NO history of migraines).  Patient was seen in the emergency room February 14, 2020, presenting for headache, photophobia, lightheadedness, "everything blinking", nausea vomiting, change speech pattern, code stroke was not called, she reported becoming acutely dizzy with nausea and vomiting that morning, also vision changes which she described as flashing blinking lights, she also reported a speech abnormality, code stroke was activated, initial CT and labs were negative, MRI was ordered with no acute abnormality, diagnosis was complex migraine versus peripheral vertigo.  She reported acute onset, there were no focal deficits in strength in the arms and legs, neurology was consulted, imaging was unremarkable, patient symptoms improved she was able to ambulate and she was discharged home.   Patient is here and states that she had a debilitating episode, lasted for many hours, she went to the emergency room, included dizziness, vomiting, slurred abnormal speech, double vision, also 2 other episodes of bouts of amnesia 24 hours each time. She does not have a history of migraines. This was something new. She could  not open her eyes due to vomiting, every time she opened her eyes she vomited, Slowly became worse, she was terrified, she thought she was dieing, they called emergency, she doesn't have much  recollection of being in the ambulance. No headache. Started with dizziness and had chest heaviness, the dizziness and vertigo were non stop, not associated with movement, she denies any drug use or alcohol use. She has never had a migraine, she has no significant history of headaches or migraines. She denies any inciting event, no illnesses. She has also had Transient Global Amnesia twice.   Reviewed notes, labs and imaging from outside physicians, which showed:  I reviewed MRI of the brain images February 14, 2020: Which appeared normal.  I reviewed CTA of the head and neck reports February 14, 2020: Which were also normal and negative for large vessel occlusion or significant atherosclerotic disease.  February 14, 2020: Rapid drug screen showed benzodiazepines and THC.  CMP showed elevated glucose otherwise normal.  Review of Systems: Patient complains of symptoms per HPI. Pertinent negatives and positives per HPI. All others negative.   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[employer]  Tobacco Use   Smoking status: Never   Smokeless tobacco: Never  Substance and Sexual Activity   Alcohol use: Yes    Alcohol/week: 3.0 - 5.0 standard drinks    Types: 3 - 5 Glasses of wine per week    Comment: per week   Drug use: Never   Sexual activity: Not on file  Other Topics Concern   Not on file  Social History Narrative   Retired 07/10/17 from [employer]   No regular exercise      Lives at home with husband   Caffeine: 2 glasses/day diet dr pepper   Social Determinants of Health   Financial Resource Strain: Not on file  Food Insecurity: Not on file  Transportation Needs: Not on file  Physical Activity: Not on file  Stress: Not on file  Social Connections: Not on file  Intimate  Partner Violence: Not on file    Family History  Problem Relation Age of Onset   Hypertension Mother    Heart disease Mother    Heart failure Mother    Hypertension Father    Ulcers Father    Gallstones Father    Kidney Stones Father    Hypertension Sister    Hypertension Brother    Heart attack Maternal Grandmother     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   Osteoporosis 01/14/2017   07/2018 - spine  -2.3,  RFN  -1.9,  LFN  -2.3   -  improvement in hips dexa 07/25/17 - spine  -3.1,   RFN  -2.2,  LFN  -2.4  - started on prolia DEXA 05/13/2010 in [place], osteopenia   Spine: -2.3   LFN  -1.7   RFN:  -1.8     FRAX  15%, 1.9%    Paroxysmal atrial fibrillation 09/29/2020   PFO with atrial septal aneurysm 06/19/2020   Plant dermatitis 01/08/2020   Pneumonia    Purpura 04/18/2017   Rheumatoid arthritis 01/13/2017   Following at [facility]ology  Has been on Prednisone, Plaquenil, Arava, Humira, etolodac, Enbrel, Orencia, Remicade and Rituxan     Sebaceous cyst 04/18/2017   TIA (transient ischemic attack) 04/22/2020   Ventral hernia without obstruction or gangrene 11/01/2019   Noted on Ct scan 10/2019   Vitamin D deficiency 06/22/2017    Patient Active Problem List   Diagnosis Date Noted   Paroxysmal atrial fibrillation 09/29/2020   Hyperglycemia 09/28/2020   PFO with atrial septal aneurysm s/p closure  06/19/2020   TIA (transient ischemic attack) 04/22/2020   Adjustment disorder with mixed anxiety and depressed mood    Ventral hernia without obstruction or gangrene 11/01/2019   Hypertension 08/30/2019   Vitamin D deficiency 06/22/2017   Hemangioma 04/18/2017   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   Purpura 04/18/2017   Hypothyroidism 01/14/2017   History of tuberculosis exposure 01/14/2017   Osteoporosis - [facility], Dr Remer Macho 01/14/2017   History of gastric bypass 01/14/2017   History of colon polyps 01/14/2017   Rheumatoid arthritis - [facility], Dr Remer Macho 01/13/2017   GERD (gastroesophageal reflux disease) 01/13/2017    Past Surgical History:  Procedure Laterality Date   APPENDECTOMY     BREAST ENHANCEMENT SURGERY     BUBBLE STUDY  05/27/2020   Procedure: BUBBLE STUDY;  Surgeon: [name], MD;  Location: [place];  Service: Cardiovascular;;   GASTRIC BYPASS     PATENT FORAMEN OVALE(PFO) CLOSURE N/A 06/19/2020   Procedure: PATENT FORAMEN OVALE (PFO) CLOSURE;  Surgeon: [name], MD;  Location: [facility];  Service: Cardiovascular;  Laterality: N/A;   TEE WITHOUT CARDIOVERSION N/A 05/27/2020   Procedure: TRANSESOPHAGEAL ECHOCARDIOGRAM (TEE);  Surgeon: [name], MD;  Location: [facility] ENDOSCOPY;  Service: Cardiovascular;  Laterality: N/A;   TONSILLECTOMY      Current Outpatient Medications  Medication Sig Dispense Refill   albuterol (VENTOLIN HFA) 108 (90 Base) MCG/ACT inhaler INHALE 2 PUFFS INTO THE LUNGS EVERY 6 HOURS AS NEEDED FOR WHEEZING OR SHORTNESS OF BREATH 20.1 g 0   amLODipine (NORVASC) 2.5 MG tablet Take 1 tablet (2.5 mg total) by mouth daily. 90 tablet 3   apixaban (ELIQUIS) 5 MG TABS tablet Take 1 tablet (5 mg total) by mouth 2 (two) times daily. 180 tablet 2   Ascorbic Acid (VITAMIN C PO) Take 3,200 mg by mouth daily. 1600 mg each     calcium carbonate (OS-CAL) 1250 (500 Ca) MG chewable tablet Chew by mouth.     calcium  carbonate (TUMS - DOSED IN MG ELEMENTAL CALCIUM) 500 MG chewable tablet Chew 2 tablets by mouth daily as needed for indigestion or heartburn.     Calcium Citrate-Vitamin D (CALCIUM + D PO) Take 1 capsule by mouth 2 (two) times daily.     Cholecalciferol (VITAMIN D3) 250 MCG (10000 UT) TABS Take 5,000 Units by mouth 2 (two) times daily.      Coenzyme Q10 (COQ-10) 100 MG CAPS Take 200 mg by mouth daily.  diltiazem (CARDIZEM) 30 MG tablet Take 1 tablet every 4 hours AS NEEDED for heart rate >100 45 tablet 1   guaiFENesin (MUCINEX) 600 MG 12 hr tablet Take 600 mg by mouth daily.     Krill Oil 1000 MG CAPS Take 1,000 mg by mouth daily.     levothyroxine (SYNTHROID) 25 MCG tablet TAKE 1 TABLET(25 MCG) BY MOUTH DAILY 90 tablet 1   loratadine (CLARITIN) 10 MG tablet Take 10 mg by mouth daily as needed.     Misc Natural Products (CVS GLUCOS-CHONDROIT-MSM TS) TABS Take 2 tablets by mouth at bedtime.     Misc Natural Products (ELDERBERRY ZINC/VIT C/IMMUNE MT) Use as directed 2 each in the mouth or throat daily. Elderberry 100 mg Zinc 8 mg Vitamin C 90 mg     Multiple Vitamins-Minerals (CENTRUM SILVER PO) Take 1 tablet by mouth daily.      Omega-3 Fatty Acids (FISH OIL) 1000 MG CAPS Take 1,000 mg by mouth daily.     orlistat (ALLI) 60 MG capsule Take 60 mg by mouth 3 (three) times daily with meals.      OVER THE COUNTER MEDICATION Take 1,040 mg by mouth daily. Liposomal otc supplement     Probiotic Product (PROBIOTIC PO) Take 1 capsule by mouth daily.     pyridOXINE (VITAMIN B-6) 100 MG tablet Take 200 mg by mouth daily.     riTUXimab (RITUXAN) 500 MG/50ML injection Inject 10 mg into the vein every 6 (six) months.     Tixagevimab & Cilgavimab (EVUSHELD) 150 & 150 MG/1.5ML SOLN Inject into the muscle every 6 (six) months.     UNABLE TO FIND Take 2 capsules by mouth daily. Med Name: Monolaurin 936 mg and Zinc 15 mg     valACYclovir (VALTREX) 500 MG tablet TAKE 1 TABLET(500 MG) BY MOUTH DAILY 90 tablet 1    venlafaxine XR (EFFEXOR-XR) 150 MG 24 hr capsule Take 1 capsule (150 mg total) by mouth daily with breakfast. 90 capsule 3   zinc gluconate 50 MG tablet Take 50 mg by mouth every Monday, Wednesday, and Friday.     No current facility-administered medications for this visit.   Facility-Administered Medications Ordered in Other Visits  Medication Dose Route Frequency Provider Last Rate Last Admin   sodium chloride flush (NS) 0.9 % injection 10 mL  10 mL Intravenous PRN Angelena Form R, PA-C   20 mL at 07/16/21 1025    Allergies as of 08/24/2021 - Review Complete 07/16/2021  Allergen Reaction Noted   Methotrexate derivatives Itching 04/22/2020    Vitals: There were no vitals taken for this visit. Last Weight:  Wt Readings from Last 1 Encounters:  07/16/21 164 lb (74.4 kg)   Last Height:   Ht Readings from Last 1 Encounters:  07/16/21 5\' 3"  (1.6 m)     Physical exam: General: well developed, well nourished, very pleasant elderly Caucasian female, seated, in no evident distress Head: head normocephalic and atraumatic.    Neurologic Exam Mental Status: Awake and fully alert.  Fluent speech and language.  Oriented to place and time. Recent and remote memory intact. Attention span, concentration and fund of knowledge appropriate. Mood and affect appropriate.  Cranial Nerves: Extraocular movements full without nystagmus. Hearing intact to voice. Facial sensation intact. Face, tongue, palate moves normally and symmetrically.  Shoulder shrug symmetric. Motor: No evidence of weakness per drift assessment Coordination: Rapid alternating movements normal in all extremities. Finger-to-nose and heel-to-shin performed accurately bilaterally.      Assessment/Plan:  Patient presented to the ED on 02/14/2020 with severe nausea, vomiting, non-positional vertigo, acute severe onset, dysarthria concerning for posterior circulation TIA. She has has additionally 2 episodes of Transient Global  amnesia which is very unusual.  She denied having a headache during the episode and she has never had migraines or significant headaches so I don't think this was a "complicated" migraine, and doesn't sound liker BPPV.  Further stroke work-up revealed evidence of atrial fibrillation and PFO     TIA No additional TIA/stroke symptoms Continue Eliquis 5 mg twice daily and omega-3 for secondary stroke prevention Discussed secondary stroke prevention measures and importance of close PCP follow-up for aggressive stroke risk factor management including HTN with BP goal<130/90 and HLD with LDL goal<70   A. Fib:  Found on cardiac monitor 05/2020.   CHA2DS2-VASc score of 5 therefore cardiology Eliquis 5 mg twice daily which they continue to manage    PFO S/p closure 08/21/2019 by Dr. [name] Continue aspirin 81 mg daily until 3/10 per cards (3 month post closure)      Doing well from stroke standpoint and risk factors are managed by PCP. She may follow up PRN, as usual for our patients who are strictly being followed for stroke. If any new neurological issues should arise, request PCP place referral for evaluation by one of our neurologists. Thank you.      CC:  [name], MD   I spent 26 minutes of face-to-face and non-face-to-face time with patient.  This included previsit chart review, lab review, study review, order entry, electronic health record documentation, patient education and discussion regarding prior episodes likely TIA, A. fib and PFO s/p closure, routine follow-up with PCP for aggressive stroke risk factor management and answered all other questions to patient satisfaction   [name], AGNP-BC  [facility] [address] [place], [place] [address]  Phone [phone] Fax [phone] Note: This document was prepared with digital dictation and possible smart phrase technology. Any transcriptional errors that result from this process  are unintentional.

## 2021-08-25 ENCOUNTER — Ambulatory Visit: Payer: Medicare Other | Admitting: Adult Health

## 2021-08-26 ENCOUNTER — Other Ambulatory Visit (HOSPITAL_COMMUNITY): Payer: Self-pay | Admitting: *Deleted

## 2021-08-26 MED ORDER — APIXABAN 5 MG PO TABS: 5.0000 mg | ORAL_TABLET | Freq: Two times a day (BID) | ORAL | 2 refills | Status: DC

## 2021-09-01 DIAGNOSIS — Z5181 Encounter for therapeutic drug level monitoring: Secondary | ICD-10-CM | POA: Diagnosis not present

## 2021-09-01 DIAGNOSIS — Z79899 Other long term (current) drug therapy: Secondary | ICD-10-CM | POA: Diagnosis not present

## 2021-09-01 DIAGNOSIS — M818 Other osteoporosis without current pathological fracture: Secondary | ICD-10-CM | POA: Diagnosis not present

## 2021-09-01 DIAGNOSIS — M0609 Rheumatoid arthritis without rheumatoid factor, multiple sites: Secondary | ICD-10-CM | POA: Diagnosis not present

## 2021-09-07 NOTE — Telephone Encounter (Signed)
Pt ready for scheduling on or after 09/20/21  Out-of-pocket cost due at time of visit: $0  Primary: Medicare Prolia co-insurance: 20% (approximately $276) Admin fee co-insurance: 20% (approximately $25)  Secondary: AARP Medicare Supp Prolia co-insurance: Covers Medicare Part B co-insurance and deductible.  Admin fee co-insurance: Covers Medicare Part B co-insurance and deductible.   Deductible: covered by secondary  Prior Auth: not required PA# Valid:   ** This summary of benefits is an estimation of the patient's out-of-pocket cost. Exact cost may vary based on individual plan coverage.

## 2021-09-19 ENCOUNTER — Encounter: Payer: Self-pay | Admitting: Internal Medicine

## 2021-09-19 NOTE — Patient Instructions (Addendum)
? ? ? ?  Blood work was ordered.   ? ? ?Medications changes include :   naltrexone 50 mg daily ? ? ?Your prescription(s) have been sent to your pharmacy.  ? ? ?A diagnostic mammogram was ordered. ? ? ?Return in about 4 weeks (around 10/18/2021) for follow up, Schedule DEXA-[facility]. ? ?

## 2021-09-19 NOTE — Progress Notes (Signed)
? ? ? ? ?Subjective:  ? ? Patient ID: Julia [name], female    DOB: 06-18-1951, 71 y.o.   MRN: [id number] ? ?This visit occurred during the SARS-CoV-2 public health emergency.  Safety protocols were in place, including screening questions prior to the visit, additional usage of staff PPE, and extensive cleaning of exam room while observing appropriate contact time as indicated for disinfecting solutions.   ? ? ?HPI ?Julia [name] is here for follow up of her chronic medical problems, including htn, afib, hypothyroid, RA, anxiety, depression, OP ? ? ?Mammo - has knot in right breast, has implant.  The area is mildly tender.  She does plan on having the implants removed, but still has several months before she is able to see a doctor who would do this.  She wondered if she needed a mammogram. ? ?Has a lot of diarrhea - wonders if she needs a colonoscopy.  Not eating consistently.  ? ?Has been drinking more than she should.  6-8 months.  Dinks bottle of wine most nights, but not all nights.  She is trying to cut back, but has not been able.  She wonders about any medication such as naltrexone or Campral that would help deter her from some of the alcohol.  At this point she is not sure if she wants to give it up completely or just decreased.  The alcohol does make her feel better.  She does have some depression and anxiety which she realizes.  She is still dealing with the divorce issues surrounding that. ? ?Medications and allergies reviewed with patient and updated if appropriate. ? ?Current Outpatient Medications on File Prior to Visit  ?Medication Sig Dispense Refill  ? amLODipine (NORVASC) 2.5 MG tablet Take 1 tablet (2.5 mg total) by mouth daily. 90 tablet 3  ? apixaban (ELIQUIS) 5 MG TABS tablet Take 1 tablet (5 mg total) by mouth 2 (two) times daily. 180 tablet 2  ? Ascorbic Acid (VITAMIN C PO) Take 3,200 mg by mouth daily. 1600 mg each    ? calcium carbonate (TUMS - DOSED IN MG ELEMENTAL CALCIUM) 500 MG chewable tablet  Chew 2 tablets by mouth daily as needed for indigestion or heartburn.    ? Calcium Citrate-Vitamin D (CALCIUM + D PO) Take 1 capsule by mouth 2 (two) times daily.    ? Cholecalciferol (VITAMIN D3) 250 MCG (10000 UT) TABS Take 5,000 Units by mouth 2 (two) times daily.     ? Coenzyme Q10 (COQ-10) 100 MG CAPS Take 200 mg by mouth daily.    ? guaiFENesin (MUCINEX) 600 MG 12 hr tablet Take 600 mg by mouth daily.    ? Krill Oil 1000 MG CAPS Take 1,000 mg by mouth daily.    ? levothyroxine (SYNTHROID) 25 MCG tablet TAKE 1 TABLET(25 MCG) BY MOUTH DAILY 90 tablet 1  ? loratadine (CLARITIN) 10 MG tablet Take 10 mg by mouth daily as needed.    ? Misc Natural Products (CVS GLUCOS-CHONDROIT-MSM TS) TABS Take 2 tablets by mouth at bedtime.    ? Misc Natural Products (ELDERBERRY ZINC/VIT C/IMMUNE MT) Use as directed 2 each in the mouth or throat daily. Elderberry 100 mg Zinc 8 mg Vitamin C 90 mg    ? Multiple Vitamins-Minerals (CENTRUM SILVER PO) Take 1 tablet by mouth daily.     ? Omega-3 Fatty Acids (FISH OIL) 1000 MG CAPS Take 1,000 mg by mouth daily.    ? orlistat (ALLI) 60 MG capsule Take 60 mg by mouth  3 (three) times daily with meals.     ? OVER THE COUNTER MEDICATION Take 1,040 mg by mouth daily. Liposomal otc supplement    ? Probiotic Product (PROBIOTIC PO) Take 1 capsule by mouth daily.    ? pyridOXINE (VITAMIN B-6) 100 MG tablet Take 200 mg by mouth daily.    ? riTUXimab (RITUXAN) 500 MG/50ML injection Inject 10 mg into the vein every 6 (six) months.    ? UNABLE TO FIND Take 2 capsules by mouth daily. Med Name: Monolaurin 936 mg and Zinc 15 mg    ? valACYclovir (VALTREX) 500 MG tablet TAKE 1 TABLET(500 MG) BY MOUTH DAILY 90 tablet 1  ? venlafaxine XR (EFFEXOR-XR) 150 MG 24 hr capsule Take 1 capsule (150 mg total) by mouth daily with breakfast. 90 capsule 3  ? zinc gluconate 50 MG tablet Take 50 mg by mouth every Monday, Wednesday, and Friday.    ? ?Current Facility-Administered Medications on File Prior to Visit   ?Medication Dose Route Frequency Provider Last Rate Last Admin  ? sodium chloride flush (NS) 0.9 % injection 10 mL  10 mL Intravenous PRN Angelena Form R, PA-C   20 mL at 07/16/21 1025  ? ? ? ?Review of Systems  ?Constitutional:  Negative for fever.  ?Respiratory:  Negative for cough, shortness of breath and wheezing.   ?Cardiovascular:  Negative for chest pain, palpitations and leg swelling.  ?Gastrointestinal:  Positive for diarrhea. Negative for abdominal pain, blood in stool and nausea.  ?Neurological:  Negative for light-headedness and headaches.  ?Psychiatric/Behavioral:  Positive for dysphoric mood and sleep disturbance. The patient is nervous/anxious.   ? ?   ?Objective:  ? ?Vitals:  ? 09/20/21 1109  ?BP: 130/88  ?Pulse: 72  ?Temp: 98.2 ?F (36.8 ?C)  ?SpO2: 99%  ? ?BP Readings from Last 3 Encounters:  ?09/20/21 130/88  ?07/16/21 130/80  ?03/22/21 126/72  ? ?Wt Readings from Last 3 Encounters:  ?09/20/21 166 lb 6.4 oz (75.5 kg)  ?07/16/21 164 lb (74.4 kg)  ?07/13/21 163 lb (73.9 kg)  ? ?Body mass index is 29.48 kg/m?. ? ?  ?Physical Exam ?Constitutional:   ?   General: She is not in acute distress. ?   Appearance: Normal appearance.  ?HENT:  ?   Head: Normocephalic and atraumatic.  ?Eyes:  ?   Conjunctiva/sclera: Conjunctivae normal.  ?Cardiovascular:  ?   Rate and Rhythm: Normal rate and regular rhythm.  ?   Heart sounds: Normal heart sounds. No murmur heard. ?Pulmonary:  ?   Effort: Pulmonary effort is normal. No respiratory distress.  ?   Breath sounds: Normal breath sounds. No wheezing.  ?Musculoskeletal:  ?   Cervical back: Neck supple.  ?   Right lower leg: No edema.  ?   Left lower leg: No edema.  ?Lymphadenopathy:  ?   Cervical: No cervical adenopathy.  ?Skin: ?   Findings: No rash.  ?Neurological:  ?   Mental Status: She is alert. Mental status is at baseline.  ?Psychiatric:     ?   Mood and Affect: Mood normal.     ?   Behavior: Behavior normal.  ? ?   ? ?Lab Results  ?Component Value Date  ?  WBC 6.1 03/22/2021  ? HGB 12.7 03/22/2021  ? HCT 38.9 03/22/2021  ? PLT 282.0 03/22/2021  ? GLUCOSE 83 03/22/2021  ? CHOL 181 03/22/2021  ? TRIG 58.0 03/22/2021  ? HDL 96.50 03/22/2021  ? [place] 73 03/22/2021  ? ALT  23 03/22/2021  ? AST 26 03/22/2021  ? NA 139 03/22/2021  ? K 3.6 03/22/2021  ? CL 100 03/22/2021  ? CREATININE 0.49 03/22/2021  ? BUN 11 03/22/2021  ? CO2 30 03/22/2021  ? TSH 1.63 03/22/2021  ? INR 1.0 02/14/2020  ? HGBA1C 5.3 03/22/2021  ? ? ? ?Assessment & Plan:  ? ? ?See Problem List for Assessment and Plan of chronic medical problems.  ? ? ?

## 2021-09-20 ENCOUNTER — Other Ambulatory Visit: Payer: Self-pay

## 2021-09-20 ENCOUNTER — Ambulatory Visit (INDEPENDENT_AMBULATORY_CARE_PROVIDER_SITE_OTHER): Payer: Medicare Other | Admitting: Internal Medicine

## 2021-09-20 VITALS — BP 130/88 | HR 72 | Temp 98.2°F | Ht 63.0 in | Wt 166.4 lb

## 2021-09-20 DIAGNOSIS — M069 Rheumatoid arthritis, unspecified: Secondary | ICD-10-CM | POA: Diagnosis not present

## 2021-09-20 DIAGNOSIS — I480 Paroxysmal atrial fibrillation: Secondary | ICD-10-CM

## 2021-09-20 DIAGNOSIS — N6315 Unspecified lump in the right breast, overlapping quadrants: Secondary | ICD-10-CM

## 2021-09-20 DIAGNOSIS — F4323 Adjustment disorder with mixed anxiety and depressed mood: Secondary | ICD-10-CM

## 2021-09-20 DIAGNOSIS — E039 Hypothyroidism, unspecified: Secondary | ICD-10-CM

## 2021-09-20 DIAGNOSIS — I10 Essential (primary) hypertension: Secondary | ICD-10-CM

## 2021-09-20 DIAGNOSIS — M810 Age-related osteoporosis without current pathological fracture: Secondary | ICD-10-CM

## 2021-09-20 DIAGNOSIS — E559 Vitamin D deficiency, unspecified: Secondary | ICD-10-CM

## 2021-09-20 DIAGNOSIS — F1010 Alcohol abuse, uncomplicated: Secondary | ICD-10-CM

## 2021-09-20 LAB — CBC WITH DIFFERENTIAL/PLATELET
Basophils Absolute: 0.1 10*3/uL (ref 0.0–0.1)
Basophils Relative: 1 % (ref 0.0–3.0)
Eosinophils Absolute: 0.1 10*3/uL (ref 0.0–0.7)
Eosinophils Relative: 1.7 % (ref 0.0–5.0)
HCT: 38.3 % (ref 36.0–46.0)
Hemoglobin: 12.7 g/dL (ref 12.0–15.0)
Lymphocytes Relative: 18.6 % (ref 12.0–46.0)
Lymphs Abs: 1.1 10*3/uL (ref 0.7–4.0)
MCHC: 33.2 g/dL (ref 30.0–36.0)
MCV: 97.8 fl (ref 78.0–100.0)
Monocytes Absolute: 0.8 10*3/uL (ref 0.1–1.0)
Monocytes Relative: 13.2 % — ABNORMAL HIGH (ref 3.0–12.0)
Neutro Abs: 4 10*3/uL (ref 1.4–7.7)
Neutrophils Relative %: 65.5 % (ref 43.0–77.0)
Platelets: 257 10*3/uL (ref 150.0–400.0)
RBC: 3.92 Mil/uL (ref 3.87–5.11)
RDW: 13.3 % (ref 11.5–15.5)
WBC: 6.1 10*3/uL (ref 4.0–10.5)

## 2021-09-20 LAB — COMPREHENSIVE METABOLIC PANEL
ALT: 46 U/L — ABNORMAL HIGH (ref 0–35)
AST: 40 U/L — ABNORMAL HIGH (ref 0–37)
Albumin: 4.6 g/dL (ref 3.5–5.2)
Alkaline Phosphatase: 53 U/L (ref 39–117)
BUN: 13 mg/dL (ref 6–23)
CO2: 23 mEq/L (ref 19–32)
Calcium: 9.1 mg/dL (ref 8.4–10.5)
Chloride: 104 mEq/L (ref 96–112)
Creatinine, Ser: 0.51 mg/dL (ref 0.40–1.20)
GFR: 95.92 mL/min (ref >60.00)
Glucose, Bld: 85 mg/dL (ref 70–99)
Potassium: 3.2 mEq/L — ABNORMAL LOW (ref 3.5–5.1)
Sodium: 138 mEq/L (ref 135–145)
Total Bilirubin: 0.5 mg/dL (ref 0.2–1.2)
Total Protein: 6.7 g/dL (ref 6.0–8.3)

## 2021-09-20 LAB — TSH: TSH: 1.28 u[IU]/mL (ref 0.35–5.50)

## 2021-09-20 LAB — VITAMIN D 25 HYDROXY (VIT D DEFICIENCY, FRACTURES): VITD: 50.54 ng/mL (ref 30.00–100.00)

## 2021-09-20 MED ORDER — NALTREXONE HCL 50 MG PO TABS: 50.0000 mg | ORAL_TABLET | Freq: Every day | ORAL | 5 refills | Status: DC

## 2021-09-20 MED ORDER — DENOSUMAB 60 MG/ML ~~LOC~~ SOSY
60.0000 mg | PREFILLED_SYRINGE | Freq: Once | SUBCUTANEOUS | Status: AC
  Administered 2021-09-20: 60 mg via SUBCUTANEOUS

## 2021-09-20 NOTE — Assessment & Plan Note (Signed)
Chronic Blood pressure well controlled CMP Continue amlodipine 2.5 mg daily 

## 2021-09-20 NOTE — Assessment & Plan Note (Signed)
Chronic  ?Clinically euthyroid ?Currently taking levothyroxine 25 mcg daily ?Check tsh  ?Titrate med dose if needed ?

## 2021-09-20 NOTE — Assessment & Plan Note (Signed)
Chronic ?Monitored by her rheumatologist at [facility] ?Receiving Prolia here every 6 months-due for injection today-given ?DEXA due-ordered ?Check vitamin D level ?Stressed regular exercise ?

## 2021-09-20 NOTE — Assessment & Plan Note (Signed)
New ?For 6-8 months has been drinking more alcohol and would like to cut back ?Has some underlying anxiety, depression, lonliness - she is aware that this driving some of her alcohol abuse ?Deferred seeing a therapist/psychiatrist at this time ?Will try naltrexone 50 mg daily - she is aware of the side effects ?F/u in one month ?

## 2021-09-20 NOTE — Assessment & Plan Note (Signed)
Chronic ?Paroxysmal ?Following with GI ?Diltiazem 30 mg every 4 hours as needed ?CBC, CMP, TSH ?

## 2021-09-20 NOTE — Assessment & Plan Note (Addendum)
Chronic ?Management per [facility] rheumatology ?On rituxan ?

## 2021-09-20 NOTE — Assessment & Plan Note (Signed)
Chronic Controlled, Stable Continue Effexor 150 mg daily 

## 2021-09-20 NOTE — Assessment & Plan Note (Signed)
Chronic Taking vitamin D daily Check vitamin D level  

## 2021-09-20 NOTE — Progress Notes (Signed)
Patient given Prolia injection during OV. Given in Left arm Subcutaneous . Patient tolerated well. ? ? ?Please co sign  ?

## 2021-09-21 ENCOUNTER — Other Ambulatory Visit (HOSPITAL_BASED_OUTPATIENT_CLINIC_OR_DEPARTMENT_OTHER): Payer: Medicare Other

## 2021-09-23 ENCOUNTER — Encounter: Payer: Self-pay | Admitting: Internal Medicine

## 2021-10-07 ENCOUNTER — Ambulatory Visit: Payer: Medicare Other

## 2021-10-16 ENCOUNTER — Other Ambulatory Visit: Payer: Self-pay | Admitting: Internal Medicine

## 2021-10-20 DIAGNOSIS — H25813 Combined forms of age-related cataract, bilateral: Secondary | ICD-10-CM | POA: Diagnosis not present

## 2021-10-20 DIAGNOSIS — H35363 Drusen (degenerative) of macula, bilateral: Secondary | ICD-10-CM | POA: Diagnosis not present

## 2021-10-20 DIAGNOSIS — H04123 Dry eye syndrome of bilateral lacrimal glands: Secondary | ICD-10-CM | POA: Diagnosis not present

## 2021-10-20 DIAGNOSIS — H40013 Open angle with borderline findings, low risk, bilateral: Secondary | ICD-10-CM | POA: Diagnosis not present

## 2021-10-21 ENCOUNTER — Encounter: Payer: Self-pay | Admitting: Internal Medicine

## 2021-10-21 NOTE — Progress Notes (Signed)
? ? ? ? ?Subjective:  ? ? Patient ID: Julia [name], female    DOB: 06-Oct-1950, 71 y.o.   MRN: [id number] ? ?This visit occurred during the SARS-CoV-2 public health emergency.  Safety protocols were in place, including screening questions prior to the visit, additional usage of staff PPE, and extensive cleaning of exam room while observing appropriate contact time as indicated for disinfecting solutions.   ? ? ?HPI ?Deari is here for follow up of her abuse of alcohol ? ?Has been on naltrexone 50 mg daily x 1 month.  She has started taking 25 mg twice daily.  She has not seen much improvement or drive to drink less alcohol.  Not seeing a therapist.  She knows she should talk to someone, but really does not want to.  She knows she has a lot going on and there is some depression and anxiety.  She knows that at the corner of some of the drinking in addition to to be alone at home and restricted to going out ? ? ? ? ?Medications and allergies reviewed with patient and updated if appropriate. ? ?Current Outpatient Medications on File Prior to Visit  ?Medication Sig Dispense Refill  ? amLODipine (NORVASC) 2.5 MG tablet Take 1 tablet (2.5 mg total) by mouth daily. 90 tablet 3  ? apixaban (ELIQUIS) 5 MG TABS tablet Take 1 tablet (5 mg total) by mouth 2 (two) times daily. 180 tablet 2  ? Ascorbic Acid (VITAMIN C PO) Take 3,200 mg by mouth daily. 1600 mg each    ? calcium carbonate (TUMS - DOSED IN MG ELEMENTAL CALCIUM) 500 MG chewable tablet Chew 2 tablets by mouth daily as needed for indigestion or heartburn.    ? Calcium Citrate-Vitamin D (CALCIUM + D PO) Take 1 capsule by mouth 2 (two) times daily.    ? Cholecalciferol (VITAMIN D3) 250 MCG (10000 UT) TABS Take 5,000 Units by mouth 2 (two) times daily.     ? Coenzyme Q10 (COQ-10) 100 MG CAPS Take 200 mg by mouth daily.    ? guaiFENesin (MUCINEX) 600 MG 12 hr tablet Take 600 mg by mouth daily.    ? Krill Oil 1000 MG CAPS Take 1,000 mg by mouth daily.    ? loratadine (CLARITIN)  10 MG tablet Take 10 mg by mouth daily as needed.    ? Misc Natural Products (CVS GLUCOS-CHONDROIT-MSM TS) TABS Take 2 tablets by mouth at bedtime.    ? Misc Natural Products (ELDERBERRY ZINC/VIT C/IMMUNE MT) Use as directed 2 each in the mouth or throat daily. Elderberry 100 mg Zinc 8 mg Vitamin C 90 mg    ? Multiple Vitamins-Minerals (CENTRUM SILVER PO) Take 1 tablet by mouth daily.     ? naltrexone (DEPADE) 50 MG tablet Take 1 tablet (50 mg total) by mouth daily. 30 tablet 5  ? Omega-3 Fatty Acids (FISH OIL) 1000 MG CAPS Take 1,000 mg by mouth daily.    ? orlistat (ALLI) 60 MG capsule Take 60 mg by mouth 3 (three) times daily with meals.     ? OVER THE COUNTER MEDICATION Take 1,040 mg by mouth daily. Liposomal otc supplement    ? Probiotic Product (PROBIOTIC PO) Take 1 capsule by mouth daily.    ? pyridOXINE (VITAMIN B-6) 100 MG tablet Take 200 mg by mouth daily.    ? riTUXimab (RITUXAN) 500 MG/50ML injection Inject 10 mg into the vein every 6 (six) months.    ? UNABLE TO FIND Take 2 capsules  by mouth daily. Med Name: Monolaurin 936 mg and Zinc 15 mg    ? valACYclovir (VALTREX) 500 MG tablet TAKE 1 TABLET(500 MG) BY MOUTH DAILY 90 tablet 1  ? venlafaxine XR (EFFEXOR-XR) 150 MG 24 hr capsule Take 1 capsule (150 mg total) by mouth daily with breakfast. 90 capsule 3  ? zinc gluconate 50 MG tablet Take 50 mg by mouth every Monday, Wednesday, and Friday.    ? ?Current Facility-Administered Medications on File Prior to Visit  ?Medication Dose Route Frequency Provider Last Rate Last Admin  ? sodium chloride flush (NS) 0.9 % injection 10 mL  10 mL Intravenous PRN Angelena Form R, PA-C   20 mL at 07/16/21 1025  ? ? ? ?Review of Systems ? ?   ?Objective:  ? ?Vitals:  ? 10/22/21 1322  ?BP: 122/74  ?Pulse: 80  ?Temp: 98 ?F (36.7 ?C)  ?SpO2: 99%  ? ?BP Readings from Last 3 Encounters:  ?10/22/21 122/74  ?09/20/21 130/88  ?07/16/21 130/80  ? ?Wt Readings from Last 3 Encounters:  ?10/22/21 165 lb (74.8 kg)  ?09/20/21 166 lb  6.4 oz (75.5 kg)  ?07/16/21 164 lb (74.4 kg)  ? ?Body mass index is 29.23 kg/m?. ? ?  ?Physical Exam ?Constitutional:   ?   General: She is not in acute distress. ?   Appearance: Normal appearance.  ?HENT:  ?   Head: Normocephalic.  ?Neurological:  ?   Mental Status: She is alert. Mental status is at baseline.  ?Psychiatric:     ?   Mood and Affect: Mood normal.     ?   Behavior: Behavior normal.     ?   Thought Content: Thought content normal.     ?   Judgment: Judgment normal.  ? ?   ? ?Lab Results  ?Component Value Date  ? WBC 6.1 09/20/2021  ? HGB 12.7 09/20/2021  ? HCT 38.3 09/20/2021  ? PLT 257.0 09/20/2021  ? GLUCOSE 85 09/20/2021  ? CHOL 181 03/22/2021  ? TRIG 58.0 03/22/2021  ? HDL 96.50 03/22/2021  ? [place] 73 03/22/2021  ? ALT 46 (H) 09/20/2021  ? AST 40 (H) 09/20/2021  ? NA 138 09/20/2021  ? K 3.2 (L) 09/20/2021  ? CL 104 09/20/2021  ? CREATININE 0.51 09/20/2021  ? BUN 13 09/20/2021  ? CO2 23 09/20/2021  ? TSH 1.28 09/20/2021  ? INR 1.0 02/14/2020  ? HGBA1C 5.3 03/22/2021  ? ? ? ?Assessment & Plan:  ? ? ?See Problem List for Assessment and Plan of chronic medical problems.  ? ? ?

## 2021-10-22 ENCOUNTER — Ambulatory Visit (INDEPENDENT_AMBULATORY_CARE_PROVIDER_SITE_OTHER): Payer: Medicare Other | Admitting: Internal Medicine

## 2021-10-22 ENCOUNTER — Encounter: Payer: Self-pay | Admitting: Internal Medicine

## 2021-10-22 ENCOUNTER — Other Ambulatory Visit: Payer: Self-pay | Admitting: Internal Medicine

## 2021-10-22 ENCOUNTER — Ambulatory Visit (INDEPENDENT_AMBULATORY_CARE_PROVIDER_SITE_OTHER)
Admission: RE | Admit: 2021-10-22 | Discharge: 2021-10-22 | Disposition: A | Discharge status: Home / Self Care | Payer: Medicare Other | Source: Ambulatory Visit | Attending: Internal Medicine | Admitting: Internal Medicine

## 2021-10-22 VITALS — BP 122/74 | HR 80 | Temp 98.0°F | Ht 63.0 in | Wt 165.0 lb

## 2021-10-22 DIAGNOSIS — M810 Age-related osteoporosis without current pathological fracture: Secondary | ICD-10-CM | POA: Diagnosis not present

## 2021-10-22 DIAGNOSIS — F1010 Alcohol abuse, uncomplicated: Secondary | ICD-10-CM | POA: Diagnosis not present

## 2021-10-22 DIAGNOSIS — I10 Essential (primary) hypertension: Secondary | ICD-10-CM

## 2021-10-22 NOTE — Patient Instructions (Addendum)
? ? ?  Medications changes include :   none- you can consider stopping the naltrexone. ? ? ? ?I will send you a list of counselors through [facility].  ? ? ? ?

## 2021-10-22 NOTE — Assessment & Plan Note (Signed)
Chronic Blood pressure well-controlled Continue amlodipine 2.5 mg daily 

## 2021-10-22 NOTE — Assessment & Plan Note (Signed)
Subacute ?Started several months ago and has been drinking approximately 1 bottle of wine at night ?She admits to underlying anxiety, depression, loneliness and at times feeling overwhelmed ?I did agree to let her try naltrexone to see if that would help, but it has not and I as I explained to her she really needs counseling more than medication ?She would like to continue the naltrexone-advised to take 50 mg once a day ?Stressed that she needs professional help with trying to cut back on alcohol from addiction specialist ?I will give her names and numbers for her to reach out to someone ?

## 2021-10-26 NOTE — Telephone Encounter (Signed)
Last Prolia inj 09/20/21 ?Next Prolia inj due 03/24/22 ?

## 2021-11-17 DIAGNOSIS — M0609 Rheumatoid arthritis without rheumatoid factor, multiple sites: Secondary | ICD-10-CM | POA: Diagnosis not present

## 2021-11-17 DIAGNOSIS — Z79899 Other long term (current) drug therapy: Secondary | ICD-10-CM | POA: Diagnosis not present

## 2021-11-17 DIAGNOSIS — Z5181 Encounter for therapeutic drug level monitoring: Secondary | ICD-10-CM | POA: Diagnosis not present

## 2021-12-13 ENCOUNTER — Encounter: Payer: Self-pay | Admitting: Internal Medicine

## 2021-12-14 DIAGNOSIS — M9901 Segmental and somatic dysfunction of cervical region: Secondary | ICD-10-CM | POA: Diagnosis not present

## 2021-12-14 DIAGNOSIS — M4728 Other spondylosis with radiculopathy, sacral and sacrococcygeal region: Secondary | ICD-10-CM | POA: Diagnosis not present

## 2021-12-14 DIAGNOSIS — M47814 Spondylosis without myelopathy or radiculopathy, thoracic region: Secondary | ICD-10-CM | POA: Diagnosis not present

## 2021-12-14 DIAGNOSIS — M9902 Segmental and somatic dysfunction of thoracic region: Secondary | ICD-10-CM | POA: Diagnosis not present

## 2021-12-14 DIAGNOSIS — M9904 Segmental and somatic dysfunction of sacral region: Secondary | ICD-10-CM | POA: Diagnosis not present

## 2021-12-14 DIAGNOSIS — M47812 Spondylosis without myelopathy or radiculopathy, cervical region: Secondary | ICD-10-CM | POA: Diagnosis not present

## 2021-12-14 DIAGNOSIS — M9903 Segmental and somatic dysfunction of lumbar region: Secondary | ICD-10-CM | POA: Diagnosis not present

## 2021-12-14 DIAGNOSIS — M47816 Spondylosis without myelopathy or radiculopathy, lumbar region: Secondary | ICD-10-CM | POA: Diagnosis not present

## 2021-12-20 DIAGNOSIS — M9903 Segmental and somatic dysfunction of lumbar region: Secondary | ICD-10-CM | POA: Diagnosis not present

## 2021-12-20 DIAGNOSIS — M47812 Spondylosis without myelopathy or radiculopathy, cervical region: Secondary | ICD-10-CM | POA: Diagnosis not present

## 2021-12-20 DIAGNOSIS — M4728 Other spondylosis with radiculopathy, sacral and sacrococcygeal region: Secondary | ICD-10-CM | POA: Diagnosis not present

## 2021-12-20 DIAGNOSIS — M9902 Segmental and somatic dysfunction of thoracic region: Secondary | ICD-10-CM | POA: Diagnosis not present

## 2021-12-20 DIAGNOSIS — M9904 Segmental and somatic dysfunction of sacral region: Secondary | ICD-10-CM | POA: Diagnosis not present

## 2021-12-20 DIAGNOSIS — M9901 Segmental and somatic dysfunction of cervical region: Secondary | ICD-10-CM | POA: Diagnosis not present

## 2021-12-20 DIAGNOSIS — M47814 Spondylosis without myelopathy or radiculopathy, thoracic region: Secondary | ICD-10-CM | POA: Diagnosis not present

## 2021-12-20 DIAGNOSIS — M47816 Spondylosis without myelopathy or radiculopathy, lumbar region: Secondary | ICD-10-CM | POA: Diagnosis not present

## 2021-12-21 ENCOUNTER — Encounter: Payer: Self-pay | Admitting: Internal Medicine

## 2021-12-21 DIAGNOSIS — I4891 Unspecified atrial fibrillation: Secondary | ICD-10-CM | POA: Diagnosis not present

## 2021-12-21 DIAGNOSIS — Z9882 Breast implant status: Secondary | ICD-10-CM | POA: Diagnosis not present

## 2021-12-21 DIAGNOSIS — Z7901 Long term (current) use of anticoagulants: Secondary | ICD-10-CM | POA: Diagnosis not present

## 2021-12-21 DIAGNOSIS — Z8673 Personal history of transient ischemic attack (TIA), and cerebral infarction without residual deficits: Secondary | ICD-10-CM | POA: Diagnosis not present

## 2021-12-21 DIAGNOSIS — F419 Anxiety disorder, unspecified: Secondary | ICD-10-CM | POA: Diagnosis not present

## 2021-12-21 DIAGNOSIS — Z9884 Bariatric surgery status: Secondary | ICD-10-CM | POA: Diagnosis not present

## 2021-12-21 DIAGNOSIS — Z45811 Encounter for adjustment or removal of right breast implant: Secondary | ICD-10-CM | POA: Diagnosis not present

## 2021-12-21 NOTE — Progress Notes (Unsigned)
Subjective:    Patient ID: Julia [name], female    DOB: 22-Nov-1950, 71 y.o.   MRN: [id number]      HPI [name] is here for No chief complaint on file.   Blood in stool -    Last colonoscopy 11/017 - normal - repeat in 10 years.   Medications and allergies reviewed with patient and updated if appropriate.  Current Outpatient Medications on File Prior to Visit  Medication Sig Dispense Refill   amLODipine (NORVASC) 2.5 MG tablet Take 1 tablet (2.5 mg total) by mouth daily. 90 tablet 3   apixaban (ELIQUIS) 5 MG TABS tablet Take 1 tablet (5 mg total) by mouth 2 (two) times daily. 180 tablet 2   Ascorbic Acid (VITAMIN C PO) Take 3,200 mg by mouth daily. 1600 mg each     calcium carbonate (TUMS - DOSED IN MG ELEMENTAL CALCIUM) 500 MG chewable tablet Chew 2 tablets by mouth daily as needed for indigestion or heartburn.     Calcium Citrate-Vitamin D (CALCIUM + D PO) Take 1 capsule by mouth 2 (two) times daily.     Cholecalciferol (VITAMIN D3) 250 MCG (10000 UT) TABS Take 5,000 Units by mouth 2 (two) times daily.      Coenzyme Q10 (COQ-10) 100 MG CAPS Take 200 mg by mouth daily.     guaiFENesin (MUCINEX) 600 MG 12 hr tablet Take 600 mg by mouth daily.     Krill Oil 1000 MG CAPS Take 1,000 mg by mouth daily.     levothyroxine (SYNTHROID) 25 MCG tablet TAKE 1 TABLET(25 MCG) BY MOUTH DAILY 90 tablet 3   loratadine (CLARITIN) 10 MG tablet Take 10 mg by mouth daily as needed.     Misc Natural Products (CVS GLUCOS-CHONDROIT-MSM TS) TABS Take 2 tablets by mouth at bedtime.     Misc Natural Products (ELDERBERRY ZINC/VIT C/IMMUNE MT) Use as directed 2 each in the mouth or throat daily. Elderberry 100 mg Zinc 8 mg Vitamin C 90 mg     Multiple Vitamins-Minerals (CENTRUM SILVER PO) Take 1 tablet by mouth daily.      naltrexone (DEPADE) 50 MG tablet Take 1 tablet (50 mg total) by mouth daily. 30 tablet 5   Omega-3 Fatty Acids (FISH OIL) 1000 MG CAPS Take 1,000 mg by mouth daily.     orlistat (ALLI)  60 MG capsule Take 60 mg by mouth 3 (three) times daily with meals.      OVER THE COUNTER MEDICATION Take 1,040 mg by mouth daily. Liposomal otc supplement     Probiotic Product (PROBIOTIC PO) Take 1 capsule by mouth daily.     pyridOXINE (VITAMIN B-6) 100 MG tablet Take 200 mg by mouth daily.     riTUXimab (RITUXAN) 500 MG/50ML injection Inject 10 mg into the vein every 6 (six) months.     UNABLE TO FIND Take 2 capsules by mouth daily. Med Name: Monolaurin 936 mg and Zinc 15 mg     valACYclovir (VALTREX) 500 MG tablet TAKE 1 TABLET(500 MG) BY MOUTH DAILY 90 tablet 1   venlafaxine XR (EFFEXOR-XR) 150 MG 24 hr capsule Take 1 capsule (150 mg total) by mouth daily with breakfast. 90 capsule 3   zinc gluconate 50 MG tablet Take 50 mg by mouth every Monday, Wednesday, and Friday.     Current Facility-Administered Medications on File Prior to Visit  Medication Dose Route Frequency Provider Last Rate Last Admin   sodium chloride flush (NS) 0.9 % injection 10 mL  10 mL Intravenous PRN Eileen Stanford, PA-C   20 mL at 07/16/21 1025    Review of Systems     Objective:  There were no vitals filed for this visit. BP Readings from Last 3 Encounters:  10/22/21 122/74  09/20/21 130/88  07/16/21 130/80   Wt Readings from Last 3 Encounters:  10/22/21 165 lb (74.8 kg)  09/20/21 166 lb 6.4 oz (75.5 kg)  07/16/21 164 lb (74.4 kg)   There is no height or weight on file to calculate BMI.    Physical Exam         Assessment & Plan:    See Problem List for Assessment and Plan of chronic medical problems.

## 2021-12-22 ENCOUNTER — Ambulatory Visit
Admission: RE | Admit: 2021-12-22 | Discharge: 2021-12-22 | Disposition: A | Discharge status: Home / Self Care | Payer: Medicare Other | Source: Ambulatory Visit | Attending: Internal Medicine | Admitting: Internal Medicine

## 2021-12-22 ENCOUNTER — Ambulatory Visit (INDEPENDENT_AMBULATORY_CARE_PROVIDER_SITE_OTHER): Payer: Medicare Other | Admitting: Internal Medicine

## 2021-12-22 VITALS — BP 128/82 | HR 67 | Temp 98.2°F | Ht 63.0 in | Wt 161.0 lb

## 2021-12-22 DIAGNOSIS — M9904 Segmental and somatic dysfunction of sacral region: Secondary | ICD-10-CM | POA: Diagnosis not present

## 2021-12-22 DIAGNOSIS — K5900 Constipation, unspecified: Secondary | ICD-10-CM | POA: Diagnosis not present

## 2021-12-22 DIAGNOSIS — K921 Melena: Secondary | ICD-10-CM | POA: Diagnosis not present

## 2021-12-22 DIAGNOSIS — K6389 Other specified diseases of intestine: Secondary | ICD-10-CM | POA: Diagnosis not present

## 2021-12-22 DIAGNOSIS — D739 Disease of spleen, unspecified: Secondary | ICD-10-CM | POA: Diagnosis not present

## 2021-12-22 DIAGNOSIS — M47816 Spondylosis without myelopathy or radiculopathy, lumbar region: Secondary | ICD-10-CM | POA: Diagnosis not present

## 2021-12-22 DIAGNOSIS — M47814 Spondylosis without myelopathy or radiculopathy, thoracic region: Secondary | ICD-10-CM | POA: Diagnosis not present

## 2021-12-22 DIAGNOSIS — N8189 Other female genital prolapse: Secondary | ICD-10-CM | POA: Diagnosis not present

## 2021-12-22 DIAGNOSIS — M9902 Segmental and somatic dysfunction of thoracic region: Secondary | ICD-10-CM | POA: Diagnosis not present

## 2021-12-22 DIAGNOSIS — M9901 Segmental and somatic dysfunction of cervical region: Secondary | ICD-10-CM | POA: Diagnosis not present

## 2021-12-22 DIAGNOSIS — M47812 Spondylosis without myelopathy or radiculopathy, cervical region: Secondary | ICD-10-CM | POA: Diagnosis not present

## 2021-12-22 DIAGNOSIS — M9903 Segmental and somatic dysfunction of lumbar region: Secondary | ICD-10-CM | POA: Diagnosis not present

## 2021-12-22 DIAGNOSIS — I10 Essential (primary) hypertension: Secondary | ICD-10-CM

## 2021-12-22 DIAGNOSIS — M4728 Other spondylosis with radiculopathy, sacral and sacrococcygeal region: Secondary | ICD-10-CM | POA: Diagnosis not present

## 2021-12-22 DIAGNOSIS — R1031 Right lower quadrant pain: Secondary | ICD-10-CM

## 2021-12-22 DIAGNOSIS — K439 Ventral hernia without obstruction or gangrene: Secondary | ICD-10-CM | POA: Diagnosis not present

## 2021-12-22 HISTORY — DX: Constipation, unspecified: K59.00

## 2021-12-22 MED ORDER — IOPAMIDOL (ISOVUE-300) INJECTION 61%
100.0000 mL | Freq: Once | INTRAVENOUS | Status: AC | PRN
  Administered 2021-12-22: 100 mL via INTRAVENOUS

## 2021-12-22 NOTE — Patient Instructions (Signed)
     Medications changes include :   none   A Ct scan was ordered.  Someone will call you to schedule this.    A referral was ordered for Gastroenterology     Someone from that office will call you to schedule an appointment.

## 2021-12-22 NOTE — Assessment & Plan Note (Signed)
Subacute Right lower quadrant > left lower quadrant pain started about a month ago-she is not exactly sure how long ago it started.  It occurred during a time that she was very stressed and was really not paying attention to this pain In the past 3 weeks she has had constipation which she has never had before that was fairly painful and blood with dark red blood and fresh blood. She is taking a stool softener regularly now, but still seeing dark and fresh blood She is tender on exam Blood work done at [facility] a month ago was normal-she would like to hold off on additional blood work at this time We will check CT of the abdomen pelvis given the tenderness on exam Will refer to GI Last colonoscopy was 2017 and was normal.  She is on Eliquis

## 2021-12-22 NOTE — Assessment & Plan Note (Addendum)
Chronic Blood pressure well-controlled Continue amlodipine 2.5 mg daily 

## 2021-12-22 NOTE — Assessment & Plan Note (Addendum)
New 3 weeks ago she came very constipated and started observing her stool which she has not done in the past She did have dark blood and fresh blood in the stool She has continued to see blood since then despite taking a stool softeners and having more normal bowel movements She is on Eliquis No history of diverticulosis and last colonoscopy 2017 in [place] was normal She is tender right lower quadrant more than left lower quadrant CT scan abdomen pelvis ordered Referral ordered for GI We will hold off on blood work per her preference-last CBC, CMP normal at [facility] in May Bleeding does not sound like it is a lot so we will continue her Eliquis as is

## 2021-12-22 NOTE — Assessment & Plan Note (Signed)
Acute Started about 3 weeks ago She has never had constipation in the past and feels it was related to eating different foods that she really should not be eating Has been taking a stool softener and bowel movements have been more normal Has had some associated lower abdominal pain and dark and fresh blood in the stool Referral order for GI CT of the abdomen pelvis ordered

## 2021-12-24 ENCOUNTER — Encounter: Payer: Self-pay | Admitting: Physician Assistant

## 2021-12-24 ENCOUNTER — Encounter: Payer: Self-pay | Admitting: Internal Medicine

## 2022-01-06 DIAGNOSIS — M9903 Segmental and somatic dysfunction of lumbar region: Secondary | ICD-10-CM | POA: Diagnosis not present

## 2022-01-06 DIAGNOSIS — M4728 Other spondylosis with radiculopathy, sacral and sacrococcygeal region: Secondary | ICD-10-CM | POA: Diagnosis not present

## 2022-01-06 DIAGNOSIS — M9904 Segmental and somatic dysfunction of sacral region: Secondary | ICD-10-CM | POA: Diagnosis not present

## 2022-01-06 DIAGNOSIS — M47816 Spondylosis without myelopathy or radiculopathy, lumbar region: Secondary | ICD-10-CM | POA: Diagnosis not present

## 2022-01-06 DIAGNOSIS — M9902 Segmental and somatic dysfunction of thoracic region: Secondary | ICD-10-CM | POA: Diagnosis not present

## 2022-01-06 DIAGNOSIS — M47812 Spondylosis without myelopathy or radiculopathy, cervical region: Secondary | ICD-10-CM | POA: Diagnosis not present

## 2022-01-06 DIAGNOSIS — M9901 Segmental and somatic dysfunction of cervical region: Secondary | ICD-10-CM | POA: Diagnosis not present

## 2022-01-06 DIAGNOSIS — M47814 Spondylosis without myelopathy or radiculopathy, thoracic region: Secondary | ICD-10-CM | POA: Diagnosis not present

## 2022-01-15 ENCOUNTER — Other Ambulatory Visit: Payer: Self-pay | Admitting: Internal Medicine

## 2022-01-17 NOTE — Progress Notes (Unsigned)
01/18/2022 Julia [name] [id number] January 13, 1951  Referring provider: [name], MD Primary GI doctor: Dr. Luvenia Starch  ASSESSMENT AND PLAN:   Acute constipation with dark red stools and BRB, unremarkable CT other than pelvic laxity and constipation Last colon 2017 unremarkable, due 2027 No family history of colon cancer but states siblings with polyps.  Most likely this is from constipation/ pelvic floor dysfunction, will do bowel purge and continue on miralax,  ER precautions given.  Long discuss with patient, schedule colonoscopy to rule out polyps, colitis, etc.  Warned of ozempic and worsening constipation. (only a week) If negative, consider linzess  Paroxysmal atrial fibrillation Patient told to hold her Eliquis for 2 days prior to time of procedure. Will instruct when and how to resume after procedure. We will communicate with her prescribing physician Dr. [name] to ensure that holding his Eliquis is acceptable. We discussed the risk, benefits and alternatives to colonoscopy/endoscopy.  We also discussed the low but real risk of cardiovascular event such as heart attack, stroke, embolism, thrombosis or ischemia/infarct of other organs off Eliquis and explained the need to seek urgent help if this occurs.  She is agreeable and wishes to proceed.  GERD she reports symptoms are currently well controlled. Lifestyle changes discussed, avoid NSAIDS Continue current medications    Patient Care Team: [name], MD as PCP - General (Internal Medicine) [name], MD as PCP - Cardiology (Cardiology) Szabat, Darnelle Maffucci, [facility] (Inactive) as Pharmacist (Pharmacist)  HISTORY OF PRESENT ILLNESS: 71 y.o. female with a past medical history of gastric bypass, rheumatoid arthritis on biologic therapy, GERD, hypothyroidism, TIA, atrial fibrillation, PFO status postclosure with Dr. [name] 06/2020, has been on Eliquis twice daily and others listed below presents for evaluation of  AB pain and rectal bleeding.  05/2016 Colonoscopy normal repeat 10 years (04/2026) 09/06/2018 upper endoscopy for epigastric discomfort, noncardiac chest pain normal esophagus, Roux-en-Y gastrojejunostomy with anastomosis characterized by visible suture which was removed, gastric pouch erosion biopsied, normal small bowel limb.   Biopsies were negative for Barrett's esophagus, negative for H. pylori gastritis  12/22/2021 CT abdomen pelvis with contrast for right lower quadrant and left lower quadrant pain for weeks, blood in the stool  showed constipation, status post gastrojejunostomy with antecolic Roux loop similar appearance of dilated loops small bowel within the central lower abdomen likely due to postoperative atony no bowel obstruction, pelvic floor laxity, duplicated IVC.  She states 6-8 weeks ago she had complete constipation which she had never experienced before. She was passing gas.  Started to take stool softeners every other day, has BM every day and soft. And can have another later in the day.  She had a lot of straining and continues to have dark red blood and bright red blood. Denies melena.  She has lower AB pain, cramping pain, intermittent can last several hours, nothing better. She denies pepto/iron.  She has been having worsening reflux, can wake her up at night. No epigastric pain. Denies nausea, vomiting.   Just started ozempic x 1 week but no other new medications/supplements.   [place], [place] 2018, divorced and lost 2 of her dogs recently. Has some depression.     Current Medications:   Current Outpatient Medications (Endocrine & Metabolic):    levothyroxine (SYNTHROID) 25 MCG tablet, TAKE 1 TABLET(25 MCG) BY MOUTH DAILY   OZEMPIC, 2 MG/DOSE, 8 MG/3ML SOPN, Inject 2 mg into the skin once a week.   Current Outpatient Medications (  Cardiovascular):    amLODipine (NORVASC) 2.5 MG tablet, Take 1 tablet (2.5 mg total) by mouth daily.   Current  Outpatient Medications (Respiratory):    loratadine (CLARITIN) 10 MG tablet, Take 10 mg by mouth daily as needed.     Current Outpatient Medications (Hematological):    apixaban (ELIQUIS) 5 MG TABS tablet, Take 1 tablet (5 mg total) by mouth 2 (two) times daily.   Current Outpatient Medications (Other):    Ascorbic Acid (VITAMIN C PO), Take 3,200 mg by mouth daily. 1600 mg each   calcium carbonate (TUMS - DOSED IN MG ELEMENTAL CALCIUM) 500 MG chewable tablet, Chew 2 tablets by mouth daily as needed for indigestion or heartburn.   Calcium Citrate-Vitamin D (CALCIUM + D PO), Take 1 capsule by mouth 2 (two) times daily.   Cholecalciferol (VITAMIN D3) 250 MCG (10000 UT) TABS, Take 5,000 Units by mouth 2 (two) times daily.    Coenzyme Q10 (COQ-10) 100 MG CAPS, Take 200 mg by mouth daily.   Krill Oil 1000 MG CAPS, Take 1,000 mg by mouth daily.   Misc Natural Products (CVS GLUCOS-CHONDROIT-MSM TS) TABS, Take 2 tablets by mouth at bedtime.   Misc Natural Products (ELDERBERRY ZINC/VIT C/IMMUNE MT), Use as directed 2 each in the mouth or throat daily. Elderberry 100 mg Zinc 8 mg Vitamin C 90 mg   Multiple Vitamins-Minerals (CENTRUM SILVER PO), Take 1 tablet by mouth daily.    naltrexone (DEPADE) 50 MG tablet, Take 1 tablet (50 mg total) by mouth daily.   Omega-3 Fatty Acids (FISH OIL) 1000 MG CAPS, Take 1,000 mg by mouth daily.   orlistat (ALLI) 60 MG capsule, Take 60 mg by mouth 3 (three) times daily with meals.    OVER THE COUNTER MEDICATION, Take 1,040 mg by mouth daily. Liposomal otc supplement   Probiotic Product (PROBIOTIC PO), Take 1 capsule by mouth daily.   pyridOXINE (VITAMIN B-6) 100 MG tablet, Take 200 mg by mouth daily.   riTUXimab (RITUXAN) 500 MG/50ML injection, Inject 10 mg into the vein every 6 (six) months.   UNABLE TO FIND, Take 2 capsules by mouth daily. Med Name: Monolaurin 936 mg and Zinc 15 mg   valACYclovir (VALTREX) 500 MG tablet, TAKE 1 TABLET(500 MG) BY MOUTH DAILY    venlafaxine XR (EFFEXOR-XR) 150 MG 24 hr capsule, Take 1 capsule (150 mg total) by mouth daily with breakfast.   zinc gluconate 50 MG tablet, Take 50 mg by mouth every Monday, Wednesday, and Friday.   Facility-Administered Medications Ordered in Other Visits (Other):    sodium chloride flush (NS) 0.9 % injection 10 mL No current facility-administered medications for this visit.  Medical History:  Past Medical History:  Diagnosis Date   Adjustment disorder with mixed anxiety and depressed mood    Arthritis    Depression    GERD (gastroesophageal reflux disease) 01/13/2017   Hemangioma 04/18/2017   History of breast augmentation    History of colon polyps 01/14/2017   Last colonoscopy 2018 - no polyps - advised 5 years   History of gastric bypass    History of pelvis fracture    in 30's   History of tuberculosis exposure 01/14/2017   Hx of latent TB, treated for 9 months   Hyperglycemia 09/28/2020   Hypertension    Hypothyroidism 01/14/2017   Melanocytic nevi, unspecified 04/18/2017   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   Obesity    Osteoporosis 01/14/2017   07/2018 - spine  -2.3,  RFN  -1.9,  LFN  -2.3   - improvement in hips dexa 07/25/17 - spine  -3.1,   RFN  -2.2,  LFN  -2.4  - started on prolia DEXA 05/13/2010 in [place], osteopenia   Spine: -2.3   LFN  -1.7   RFN:  -1.8     FRAX  15%, 1.9%    Paroxysmal atrial fibrillation 09/29/2020   PFO with atrial septal aneurysm 06/19/2020   Plant dermatitis 01/08/2020   Pneumonia    Purpura 04/18/2017   Rheumatoid arthritis 01/13/2017   Following at [facility]  Has been on Prednisone, Plaquenil, Arava, Humira, etolodac, Enbrel, Orencia, Remicade and Rituxan     Sebaceous cyst 04/18/2017   TIA (transient ischemic attack) 04/22/2020   Ventral hernia without obstruction or gangrene 11/01/2019   Noted on Ct scan 10/2019   Vitamin D deficiency 06/22/2017   Allergies:  Allergies  Allergen Reactions   Methotrexate  Derivatives Itching     Surgical History:  She  has a past surgical history that includes Appendectomy; Tonsillectomy; Gastric bypass; Breast enhancement surgery; TEE without cardioversion (N/A, 05/27/2020); Bubble study (05/27/2020); and PATENT FORAMEN OVALE(PFO) CLOSURE (N/A, 06/19/2020). Family History:  Her family history includes Gallstones in her father; Heart attack in her maternal grandmother; Heart disease in her mother; Heart failure in her mother; Hypertension in her brother, father, mother, and sister; Kidney Stones in her father; Ulcers in her father. Social History:   reports that she has never smoked. She has never used smokeless tobacco. She reports current alcohol use of about 3.0 - 5.0 standard drinks of alcohol per week. She reports that she does not use drugs.  REVIEW OF SYSTEMS  : All other systems reviewed and negative except where noted in the History of Present Illness.   PHYSICAL EXAM: BP 134/84   Pulse 90   Ht '5\' 3"'$  (1.6 m)   Wt 160 lb (72.6 kg)   BMI 28.34 kg/m  General:   Pleasant, well developed female in no acute distress Head:   Normocephalic and atraumatic. Eyes:  sclerae anicteric,conjunctive pink  Heart:   regular rate and rhythm Pulm:  Clear anteriorly; no wheezing Abdomen:   Soft, Obese AB, Active bowel sounds. mild tenderness in the LLQ. Without guarding and Without rebound, No organomegaly appreciated. Rectal: Normal external rectal exam, decreased rectal tone with very cavernous rectal vault, appreciated internal hemorrhoids, non-tender, no masses, hard large volume brown stool, hemoccult Negative Extremities:  Without edema. Msk: Symmetrical without gross deformities. Peripheral pulses intact.  Neurologic:  Alert and  oriented x4;  No focal deficits.  Skin:   Dry and intact without significant lesions or rashes. Psychiatric:  Cooperative. Normal mood and affect.    Vladimir Crofts, PA-C 11:44 AM

## 2022-01-18 ENCOUNTER — Encounter: Payer: Self-pay | Admitting: Physician Assistant

## 2022-01-18 ENCOUNTER — Other Ambulatory Visit (INDEPENDENT_AMBULATORY_CARE_PROVIDER_SITE_OTHER): Payer: Medicare Other

## 2022-01-18 ENCOUNTER — Telehealth: Payer: Self-pay | Admitting: *Deleted

## 2022-01-18 ENCOUNTER — Ambulatory Visit (INDEPENDENT_AMBULATORY_CARE_PROVIDER_SITE_OTHER): Payer: Medicare Other | Admitting: Physician Assistant

## 2022-01-18 VITALS — BP 134/84 | HR 90 | Ht 63.0 in | Wt 160.0 lb

## 2022-01-18 DIAGNOSIS — K625 Hemorrhage of anus and rectum: Secondary | ICD-10-CM

## 2022-01-18 DIAGNOSIS — K219 Gastro-esophageal reflux disease without esophagitis: Secondary | ICD-10-CM | POA: Diagnosis not present

## 2022-01-18 DIAGNOSIS — I480 Paroxysmal atrial fibrillation: Secondary | ICD-10-CM

## 2022-01-18 DIAGNOSIS — K5904 Chronic idiopathic constipation: Secondary | ICD-10-CM

## 2022-01-18 DIAGNOSIS — I700 Atherosclerosis of aorta: Secondary | ICD-10-CM

## 2022-01-18 LAB — CBC WITH DIFFERENTIAL/PLATELET
Basophils Absolute: 0.1 10*3/uL (ref 0.0–0.1)
Basophils Relative: 1.1 % (ref 0.0–3.0)
Eosinophils Absolute: 0.1 10*3/uL (ref 0.0–0.7)
Eosinophils Relative: 1.8 % (ref 0.0–5.0)
HCT: 40 % (ref 36.0–46.0)
Hemoglobin: 13 g/dL (ref 12.0–15.0)
Lymphocytes Relative: 13 % (ref 12.0–46.0)
Lymphs Abs: 0.8 10*3/uL (ref 0.7–4.0)
MCHC: 32.6 g/dL (ref 30.0–36.0)
MCV: 97.7 fl (ref 78.0–100.0)
Monocytes Absolute: 0.8 10*3/uL (ref 0.1–1.0)
Monocytes Relative: 13.2 % — ABNORMAL HIGH (ref 3.0–12.0)
Neutro Abs: 4.5 10*3/uL (ref 1.4–7.7)
Neutrophils Relative %: 70.9 % (ref 43.0–77.0)
Platelets: 251 10*3/uL (ref 150.0–400.0)
RBC: 4.09 Mil/uL (ref 3.87–5.11)
RDW: 13.1 % (ref 11.5–15.5)
WBC: 6.4 10*3/uL (ref 4.0–10.5)

## 2022-01-18 NOTE — Telephone Encounter (Signed)
[facility] Pre-operative Risk Assessment     Request for surgical clearance:     Endoscopy Procedure  What type of surgery is being performed?     colonoscopy  When is this surgery scheduled?     8/4  What type of clearance is required ?   Pharmacy  Are there any medications that need to be held prior to surgery and how long? ELIQUIS 2 days   Practice name and name of physician performing surgery?      [facility]  What is your office phone and fax number?      Phone- [phone]  [phone]  Anesthesia type (None, local, MAC, general) ?       MAC

## 2022-01-18 NOTE — Patient Instructions (Addendum)
Please do the following: Purchase a bottle of Miralax over the counter as well as a box of 5 mg dulcolax tablets. Take 4 dulcolax tablets. Wait 1 hour. You will then drink 6-8 capfuls of Miralax mixed in an adequate amount of water/juice/gatorade (you may choose which of these liquids to drink) over the next 2-3 hours. You should expect results within 1 to 6 hours after completing the bowel purge. Go to the er if you have severe AB pain, can not pass gas or stool in over 12 hours, can not hold down any food.   Recommend starting on a fiber supplement, can try metamucil first but if this causes gas/bloating switch to benefiber or citracel, these do not cause gas.  Take with fiber with with a full 8 oz glass of water once a day. This can take 1 month to start helping, so try for at least one month.  Recommend increasing water and physical activity.  Get a squatty potty to use at home or try a stool, goal is to get your knees above your hips during a bowel movement.  - Drink at least 64-80 ounces of water/liquid per day. - Establish a time to try to move your bowels every day.  For many people, this is after a cup of coffee or after a meal such as breakfast. - Sit all of the way back on the toilet keeping your back fairly straight and while sitting up, try to rest the tops of your forearms on your upper thighs.   - Raising your feet with a step stool/squatty potty can be helpful to improve the angle that allows your stool to pass through the rectum. - Relax the rectum feeling it bulge toward the toilet water.  If you feel your rectum raising toward your body, you are contracting rather than relaxing. - Breathe in and slowly exhale. "Belly breath" by expanding your belly towards your belly button. Keep belly expanded as you gently direct pressure down and back to the anus.  A low pitched GRRR sound can assist with increasing intra-abdominal pressure.  - Repeat 3-4 times. If unsuccessful, contract the  pelvic floor to restore normal tone and get off the toilet.  Avoid excessive straining. - To reduce excessive wiping by teaching your anus to normally contract, place hands on outer aspect of knees and resist knee movement outward.  Hold 5-10 second then place hands just inside of knees and resist inward movement of knees.  Hold 5 seconds.  Repeat a few times each way.  Go to the ER if unable to pass gas, severe AB pain, unable to hold down food, any shortness of breath of chest pain.  Your provider has requested that you go to the basement level for lab work before leaving today. Press "B" on the elevator. The lab is located at the first door on the left as you exit the elevator.   Due to recent changes in healthcare laws, you may see the results of your imaging and laboratory studies on MyChart before your provider has had a chance to review them.  We understand that in some cases there may be results that are confusing or concerning to you. Not all laboratory results come back in the same time frame and the provider may be waiting for multiple results in order to interpret others.  Please give [place] 48 hours in order for your provider to thoroughly review all the results before contacting the office for clarification of your results.  You will be contacted by our office prior to your procedure for directions on holding your ELIQUIS.  If you do not hear from our office 1 week prior to your scheduled procedure, please call [phone] to discuss.     Here some information about pelvic floor dysfunction. We could also refer to pelvic floor physical therapy.   Pelvic Floor Dysfunction, Female Pelvic floor dysfunction (PFD) is a condition that results when the group of muscles and connective tissues that support the organs in the pelvis (pelvic floor muscles) do not work well. These muscles and their connections form a sling that supports the colon and bladder. In women, they also support the  uterus. PFD causes pelvic floor muscles to be too weak, too tight, or both. In PFD, muscle movements are not coordinated. This may cause bowel or bladder problems. It may also cause pain. What are the causes? This condition may be caused by an injury to the pelvic area or by a weakening of pelvic muscles. This often results from pregnancy and childbirth or other types of strain. In many cases, the exact cause is not known. What increases the risk? The following factors may make you more likely to develop this condition: Having chronic bladder tissue inflammation (interstitial cystitis). Being an older person. Being overweight. History of radiation treatment for cancer in the pelvic region. Previous pelvic surgery, such as removal of the uterus (hysterectomy). What are the signs or symptoms? Symptoms of this condition vary and may include: Bladder symptoms, such as: Trouble starting urination and emptying the bladder. Frequent urinary tract infections. Leaking urine when coughing, laughing, or exercising (stress incontinence). Having to pass urine urgently or frequently. Pain when passing urine. Bowel symptoms, such as: Constipation. Urgent or frequent bowel movements. Incomplete bowel movements. Painful bowel movements. Leaking stool or gas. Unexplained genital or rectal pain. Genital or rectal muscle spasms. Low back pain. Other symptoms may include: A heavy, full, or aching feeling in the vagina. A bulge that protrudes into the vagina. Pain during or after sex. How is this diagnosed? This condition may be diagnosed based on: Your symptoms and medical history. A physical exam. During the exam, your health care provider may check your pelvic muscles for tightness, spasm, pain, or weakness. This may include a rectal exam and a pelvic exam. In some cases, you may have diagnostic tests, such as: Electrical muscle function tests. Urine flow testing. X-ray tests of bowel  function. Ultrasound of the pelvic organs. How is this treated? Treatment for this condition depends on the symptoms. Treatment options include: Physical therapy. This may include Kegel exercises to help relax or strengthen the pelvic floor muscles. Biofeedback. This type of therapy provides feedback on how tight your pelvic floor muscles are so that you can learn to control them. Internal or external massage therapy. A treatment that involves electrical stimulation of the pelvic floor muscles to help control pain (transcutaneous electrical nerve stimulation, or TENS). Sound wave therapy (ultrasound) to reduce muscle spasms. Medicines, such as: Muscle relaxants. Bladder control medicines. Surgery to reconstruct or support pelvic floor muscles may be an option if other treatments do not help. Follow these instructions at home: Activity Do your usual activities as told by your health care provider. Ask your health care provider if you should modify any activities. Do pelvic floor strengthening or relaxing exercises at home as told by your physical therapist. Lifestyle Maintain a healthy weight. Eat foods that are high in fiber, such as beans, whole grains, and fresh  fruits and vegetables. Limit foods that are high in fat and processed sugars, such as fried or sweet foods. Manage stress with relaxation techniques such as yoga or meditation. General instructions If you have problems with leakage: Use absorbable pads or wear padded underwear. Wash frequently with mild soap. Keep your genital and anal area as clean and dry as possible. Ask your health care provider if you should try a barrier cream to prevent skin irritation. Take warm baths to relieve pelvic muscle tension or spasms. Take over-the-counter and prescription medicines only as told by your health care provider. Keep all follow-up visits. How is this prevented? The cause of PFD is not always known, but there are a few things  you can do to reduce the risk of developing this condition, including: Staying at a healthy weight. Getting regular exercise. Managing stress. Contact a health care provider if: Your symptoms are not improving with home care. You have signs or symptoms of PFD that get worse at home. You develop new signs or symptoms. You have signs of a urinary tract infection, such as: Fever. Chills. Increased urinary frequency. A burning feeling when urinating. You have not had a bowel movement in 3 days (constipation). Summary Pelvic floor dysfunction results when the muscles and connective tissues in your pelvic floor do not work well. These muscles and their connections form a sling that supports your colon and bladder. In women, they also support the uterus. PFD may be caused by an injury to the pelvic area or by a weakening of pelvic muscles. PFD causes pelvic floor muscles to be too weak, too tight, or a combination of both. Symptoms may vary from person to person. In most cases, PFD can be treated with physical therapies and medicines. Surgery may be an option if other treatments do not help. This information is not intended to replace advice given to you by your health care provider. Make sure you discuss any questions you have with your health care provider. Document Revised: 11/04/2020 Document Reviewed: 11/04/2020 Elsevier Patient Education  2022 [employer].  I appreciate the  opportunity to care for you  Thank You   [facility]

## 2022-01-19 DIAGNOSIS — M9901 Segmental and somatic dysfunction of cervical region: Secondary | ICD-10-CM | POA: Diagnosis not present

## 2022-01-19 DIAGNOSIS — M9904 Segmental and somatic dysfunction of sacral region: Secondary | ICD-10-CM | POA: Diagnosis not present

## 2022-01-19 DIAGNOSIS — M9903 Segmental and somatic dysfunction of lumbar region: Secondary | ICD-10-CM | POA: Diagnosis not present

## 2022-01-19 DIAGNOSIS — M9902 Segmental and somatic dysfunction of thoracic region: Secondary | ICD-10-CM | POA: Diagnosis not present

## 2022-01-19 DIAGNOSIS — M47812 Spondylosis without myelopathy or radiculopathy, cervical region: Secondary | ICD-10-CM | POA: Diagnosis not present

## 2022-01-19 DIAGNOSIS — M47814 Spondylosis without myelopathy or radiculopathy, thoracic region: Secondary | ICD-10-CM | POA: Diagnosis not present

## 2022-01-19 DIAGNOSIS — M4728 Other spondylosis with radiculopathy, sacral and sacrococcygeal region: Secondary | ICD-10-CM | POA: Diagnosis not present

## 2022-01-19 DIAGNOSIS — M47816 Spondylosis without myelopathy or radiculopathy, lumbar region: Secondary | ICD-10-CM | POA: Diagnosis not present

## 2022-01-20 DIAGNOSIS — I700 Atherosclerosis of aorta: Secondary | ICD-10-CM | POA: Insufficient documentation

## 2022-01-20 HISTORY — DX: Atherosclerosis of aorta: I70.0

## 2022-01-20 NOTE — Telephone Encounter (Signed)
Patient with diagnosis of afib on Eliquis for anticoagulation.    Procedure: colonoscopy Date of procedure: 02/11/22  CHA2DS2-VASc Score = 6  This indicates a 9.7% annual risk of stroke. The patient's score is based upon: CHF History: 0 HTN History: 1 Diabetes History: 0 Stroke History: 2 Vascular Disease History: 1 Age Score: 1 Gender Score: 1  Aortic atherosclerosis noted on abdominal CT 12/2021, PMH updated.  CrCl >124m/min Platelet count 251K  Per office protocol, recommend that pt only hold Eliquis for 1 day prior to procedure due to hx of recurrent TIAs. Resume as soon as safely possible after procedure due to elevated CV risk off of anticoagulation.  **This guidance is not considered finalized until pre-operative APP has relayed final recommendations.**

## 2022-01-22 NOTE — Progress Notes (Signed)
Agree with assessment and plan as outlined.  

## 2022-01-24 DIAGNOSIS — M47814 Spondylosis without myelopathy or radiculopathy, thoracic region: Secondary | ICD-10-CM | POA: Diagnosis not present

## 2022-01-24 DIAGNOSIS — M47812 Spondylosis without myelopathy or radiculopathy, cervical region: Secondary | ICD-10-CM | POA: Diagnosis not present

## 2022-01-24 DIAGNOSIS — M47816 Spondylosis without myelopathy or radiculopathy, lumbar region: Secondary | ICD-10-CM | POA: Diagnosis not present

## 2022-01-24 DIAGNOSIS — M9901 Segmental and somatic dysfunction of cervical region: Secondary | ICD-10-CM | POA: Diagnosis not present

## 2022-01-24 DIAGNOSIS — M9903 Segmental and somatic dysfunction of lumbar region: Secondary | ICD-10-CM | POA: Diagnosis not present

## 2022-01-24 DIAGNOSIS — M9902 Segmental and somatic dysfunction of thoracic region: Secondary | ICD-10-CM | POA: Diagnosis not present

## 2022-01-24 DIAGNOSIS — M4728 Other spondylosis with radiculopathy, sacral and sacrococcygeal region: Secondary | ICD-10-CM | POA: Diagnosis not present

## 2022-01-24 DIAGNOSIS — M9904 Segmental and somatic dysfunction of sacral region: Secondary | ICD-10-CM | POA: Diagnosis not present

## 2022-01-24 NOTE — Telephone Encounter (Signed)
Called patient to make sure she is aware about her Eliquis hold per cardiology office protocol

## 2022-01-24 NOTE — Telephone Encounter (Signed)
Agree with recommendations.  

## 2022-02-07 ENCOUNTER — Telehealth: Payer: Self-pay | Admitting: Gastroenterology

## 2022-02-07 NOTE — Telephone Encounter (Signed)
Called patient to inform her to hold 1 full day prior to her procedure as in previous phone note. I had spoke to the patient on 7-17 but she was making sure    Dr Havery Moros, just making sure you aware this patients cardiologist is only holding her Eliquis 1 day. I told her to hold the whole day before until after her procedure. You will let her know when to start back

## 2022-02-08 NOTE — Telephone Encounter (Signed)
Thanks Shirlean Mylar, appreciate the follow up. Yes if holding only one day, will need to be held the entire day prior to the procedure. Thanks

## 2022-02-08 NOTE — Telephone Encounter (Signed)
Prolia VOB initiated via [email or web address]  Last Prolia inj 09/20/21 Next Prolia inj due 03/24/22

## 2022-02-11 ENCOUNTER — Encounter: Payer: Self-pay | Admitting: Gastroenterology

## 2022-02-11 ENCOUNTER — Ambulatory Visit (AMBULATORY_SURGERY_CENTER): Payer: Medicare Other | Admitting: Gastroenterology

## 2022-02-11 VITALS — BP 131/70 | HR 73 | Temp 97.8°F | Resp 14 | Ht 63.0 in | Wt 160.0 lb

## 2022-02-11 DIAGNOSIS — K649 Unspecified hemorrhoids: Secondary | ICD-10-CM

## 2022-02-11 DIAGNOSIS — K648 Other hemorrhoids: Secondary | ICD-10-CM | POA: Diagnosis not present

## 2022-02-11 DIAGNOSIS — K625 Hemorrhage of anus and rectum: Secondary | ICD-10-CM | POA: Diagnosis not present

## 2022-02-11 DIAGNOSIS — K5900 Constipation, unspecified: Secondary | ICD-10-CM | POA: Diagnosis not present

## 2022-02-11 MED ORDER — SODIUM CHLORIDE 0.9 % IV SOLN: 500.0000 mL | INTRAVENOUS | Status: DC

## 2022-02-11 NOTE — Patient Instructions (Addendum)
YOU HAD AN ENDOSCOPIC PROCEDURE TODAY AT [facility]:   Refer to the procedure report that was given to you for any specific questions about what was found during the examination.  If the procedure report does not answer your questions, please call your gastroenterologist to clarify.  If you requested that your care partner not be given the details of your procedure findings, then the procedure report has been included in a sealed envelope for you to review at your convenience later.  YOU SHOULD EXPECT: Some feelings of bloating in the abdomen. Passage of more gas than usual.  Walking can help get rid of the air that was put into your GI tract during the procedure and reduce the bloating. If you had a lower endoscopy (such as a colonoscopy or flexible sigmoidoscopy) you may notice spotting of blood in your stool or on the toilet paper. If you underwent a bowel prep for your procedure, you may not have a normal bowel movement for a few days.  Please Note:  You might notice some irritation and congestion in your nose or some drainage.  This is from the oxygen used during your procedure.  There is no need for concern and it should clear up in a day or so.  SYMPTOMS TO REPORT IMMEDIATELY:  Following lower endoscopy (colonoscopy or flexible sigmoidoscopy):  Excessive amounts of blood in the stool  Significant tenderness or worsening of abdominal pains  Swelling of the abdomen that is new, acute  Fever of 100F or higher   For urgent or emergent issues, a gastroenterologist can be reached at any hour by calling [phone]. Do not use MyChart messaging for urgent concerns.    DIET:  We do recommend a small meal at first, but then you may proceed to your regular diet.  Drink plenty of fluids but you should avoid alcoholic beverages for 24 hours.  MEDICATIONS: Continue present medications. Resume you Eliquis this evening.  Please see handouts given to you by your recovery  nurse.  Thank you for allowing [place] to provide for your healthcare needs today.  ACTIVITY:  You should plan to take it easy for the rest of today and you should NOT DRIVE or use heavy machinery until tomorrow (because of the sedation medicines used during the test).    FOLLOW UP: Our staff will call the number listed on your records the next business day following your procedure.  We will call around 7:15- 8:00 am to check on you and address any questions or concerns that you may have regarding the information given to you following your procedure. If we do not reach you, we will leave a message.  If you develop any symptoms (ie: fever, flu-like symptoms, shortness of breath, cough etc.) before then, please call [phone].  If you test positive for Covid 19 in the 2 weeks post procedure, please call and report this information to [place].    If any biopsies were taken you will be contacted by phone or by letter within the next 1-3 weeks.  Please call us at [phone] if you have not heard about the biopsies in 3 weeks.    SIGNATURES/CONFIDENTIALITY: You and/or your care partner have signed paperwork which will be entered into your electronic medical record.  These signatures attest to the fact that that the information above on your After Visit Summary has been reviewed and is understood.  Full responsibility of the confidentiality of this discharge information lies with you and/or your  care-partner.  

## 2022-02-11 NOTE — Progress Notes (Signed)
PT taken to PACU. Monitors in place. VSS. Report given to RN. 

## 2022-02-11 NOTE — Progress Notes (Signed)
History and Physical Interval Note: Patient seen 01/18/22 - no interval changes. History of colonoscopy 2017 [place] which was normal. Takes Eliquis, had rectal bleeding in the setting of constipation which has since stopped. Constipation resolved. Colonoscopy to further evaluate. Eliquis held 1 day prior to this exam.   02/11/2022 2:25 PM  Julia [name]  has presented today for endoscopic procedure(s), with the diagnosis of  Encounter Diagnosis  Name Primary?   Rectum bleeding Yes  .  The various methods of evaluation and treatment have been discussed with the patient and/or family. After consideration of risks, benefits and other options for treatment, the patient has consented to  the endoscopic procedure(s).   The patient's history has been reviewed, patient examined, no change in status, stable for surgery.  I have reviewed the patient's chart and labs.  Questions were answered to the patient's satisfaction.    Jolly Mango, MD [facility]

## 2022-02-11 NOTE — Progress Notes (Signed)
Pt's states no medical or surgical changes since previsit or office visit. 

## 2022-02-11 NOTE — Op Note (Addendum)
[facility] Patient Name: Julia [name] Procedure Date: 02/11/2022 2:20 PM MRN: [id number] Endoscopist: Remo Lipps P. Havery Moros , MD Age: 71 Referring MD:  Date of Birth: December 24, 1950 Gender: Female Account #: [id number] Procedure:                Colonoscopy Indications:              Rectal bleeding - in recent months in the setting                            of constipation, has not recurred Medicines:                Monitored Anesthesia Care Procedure:                Pre-Anesthesia Assessment:                           - Prior to the procedure, a History and Physical                            was performed, and patient medications and                            allergies were reviewed. The patient's tolerance of                            previous anesthesia was also reviewed. The risks                            and benefits of the procedure and the sedation                            options and risks were discussed with the patient.                            All questions were answered, and informed consent                            was obtained. Prior Anticoagulants: The patient has                            taken Eliquis (apixaban), last dose was 1 day prior                            to procedure. ASA Grade Assessment: II - A patient                            with mild systemic disease. After reviewing the                            risks and benefits, the patient was deemed in                            satisfactory condition to undergo the procedure.  After obtaining informed consent, the colonoscope                            was passed under direct vision. Throughout the                            procedure, the patient's blood pressure, pulse, and                            oxygen saturations were monitored continuously. The                            Olympus PCF-H190DL (#[id number]) Colonoscope was                            introduced  through the anus and advanced to the the                            cecum, identified by the ileocecal valve. The                            colonoscopy was technically difficult and complex                            due to unsatisfactory bowel prep. The patient                            tolerated the procedure well. The quality of the                            bowel preparation was fair. The ileocecal valve,                            appendiceal orifice, and rectum were photographed. Scope In: 2:32:41 PM Scope Out: 2:59:25 PM Scope Withdrawal Time: 0 hours 15 minutes 12 seconds  Total Procedure Duration: 0 hours 26 minutes 44 seconds  Findings:                 The perianal and digital rectal examinations were                            normal.                           A large amount of liquid stool was found in the                            entire colon, making visualization difficult.                            Lavage of the colon was performed using copious                            amounts of water, resulting in clearance with  mostly adequate visualization. The cecal cap could                            not be visualized due to stool burden that could                            not be cleared. Most of the rest of the colon could                            be viewed adequately.                           Internal hemorrhoids were found during retroflexion.                           The exam was otherwise without abnormality. Complications:            No immediate complications. Estimated blood loss:                            None. Estimated Blood Loss:     Estimated blood loss: none. Impression:               - Preparation of the colon was fair, worst in the                            right colon - could not clear cecal cap, but                            remainder of colon had mostly adequate views.                           - Internal hemorrhoids.                            - The examination was otherwise normal.                           Suspect the patient had hemorrhoidal bleeding in                            the setting of constipation, no concerning lesions                            on this exam otherwise, but could not visualize                            entire cecum. Recommendation:           - Patient has a contact number available for                            emergencies. The signs and symptoms of potential  delayed complications were discussed with the                            patient. Return to normal activities tomorrow.                            Written discharge instructions were provided to the                            patient.                           - Resume previous diet.                           - Continue present medications.                           - Resume Eliquis today                           - Continue bowel regimen, avoid constipation.                            Contact us if recurrent bleeding                           - Repeat colonoscopy in 2027 (last full screening                            exam in 2017) for screening purposes with double                            prep, if not sooner with any concerning symptoms Remo Lipps P. Labaron Digirolamo, MD 02/11/2022 3:06:46 PM This report has been signed electronically.

## 2022-02-13 ENCOUNTER — Encounter: Payer: Self-pay | Admitting: Internal Medicine

## 2022-02-13 NOTE — Patient Instructions (Addendum)
     Blood work was ordered.     Medications changes include :   none    Return in about 1 year (around 02/15/2023) for follow up.   Therapists --  Dr Mel Almond - [phone] Dr Terrance Mass   [phone] Dr Ernestene Mention   [phone]  Sandford Craze - clinical social worker/therapist -  [phone]   [address] [phone]  [facility] and [place] [place] [phone] Office  [facility]            [place]

## 2022-02-13 NOTE — Progress Notes (Signed)
Subjective:    Patient ID: Julia [name], female    DOB: 03-Oct-1950, 71 y.o.   MRN: [id number]      HPI Julia [name] is for pre-operative clearance at the request of Dr Lilia Argue for removal of B/l breast implants scheduled for 03/11/2022   She denies any personal or family history of problems with anesthesia or bleeding/blood clot problems.    She has no concerns.  She is taking all her medication as prescribed.   She is exercising regularly.  With her daily activities she denies chest pain, palpitations, SOB and lightheadedness.     She is drinking less and doing better.  She did see a therapist and that has helped - trying to connect with him again.   [name] at [facility]  phone [phone] - surgical coordinator.    Medications and allergies reviewed with patient and updated if appropriate.  Current Outpatient Medications on File Prior to Visit  Medication Sig Dispense Refill   amLODipine (NORVASC) 2.5 MG tablet Take 1 tablet (2.5 mg total) by mouth daily. 90 tablet 3   apixaban (ELIQUIS) 5 MG TABS tablet Take 1 tablet (5 mg total) by mouth 2 (two) times daily. 180 tablet 2   Ascorbic Acid (VITAMIN C PO) Take 3,200 mg by mouth daily. 1600 mg each     Calcium Citrate-Vitamin D (CALCIUM + D PO) Take 1 capsule by mouth 2 (two) times daily.     Cholecalciferol (VITAMIN D3) 250 MCG (10000 UT) TABS Take 5,000 Units by mouth 2 (two) times daily.      Coenzyme Q10 (COQ-10) 100 MG CAPS Take 200 mg by mouth daily.     Krill Oil 1000 MG CAPS Take 1,000 mg by mouth daily.     levothyroxine (SYNTHROID) 25 MCG tablet TAKE 1 TABLET(25 MCG) BY MOUTH DAILY 90 tablet 3   loratadine (CLARITIN) 10 MG tablet Take 1 tablet by mouth daily.     Misc Natural Products (CVS GLUCOS-CHONDROIT-MSM TS) TABS Take 2 tablets by mouth at bedtime.     Multiple Vitamins-Minerals (CENTRUM SILVER PO) Take 1 tablet by mouth daily.      Omega-3 Fatty Acids (FISH OIL) 1000 MG CAPS Take 1,000 mg by mouth daily.      orlistat (ALLI) 60 MG capsule Take 60 mg by mouth 3 (three) times daily with meals.      OVER THE COUNTER MEDICATION Take 1,040 mg by mouth daily. Liposomal otc supplement     OZEMPIC, 2 MG/DOSE, 8 MG/3ML SOPN Inject 2 mg into the skin once a week.     Probiotic Product (PROBIOTIC PO) Take 1 capsule by mouth daily.     pyridOXINE (VITAMIN B-6) 100 MG tablet Take 200 mg by mouth daily.     riTUXimab (RITUXAN) 500 MG/50ML injection Inject 10 mg into the vein every 6 (six) months.     UNABLE TO FIND Take 2 capsules by mouth daily. Med Name: Monolaurin 936 mg and Zinc 15 mg     valACYclovir (VALTREX) 500 MG tablet TAKE 1 TABLET(500 MG) BY MOUTH DAILY 90 tablet 1   venlafaxine XR (EFFEXOR-XR) 150 MG 24 hr capsule Take 1 capsule (150 mg total) by mouth daily with breakfast. 90 capsule 3   zinc gluconate 50 MG tablet Take 50 mg by mouth every Monday, Wednesday, and Friday.     Current Facility-Administered Medications on File Prior to Visit  Medication Dose Route Frequency Provider Last Rate Last Admin   sodium chloride flush (NS) 0.9 %  injection 10 mL  10 mL Intravenous PRN Eileen Stanford, PA-C   20 mL at 07/16/21 1025    Review of Systems  Constitutional:  Negative for chills and fever.  Eyes:  Negative for visual disturbance.  Respiratory:  Negative for cough, shortness of breath and wheezing.   Cardiovascular:  Negative for chest pain, palpitations and leg swelling.  Gastrointestinal:  Negative for abdominal pain, blood in stool, constipation, diarrhea and nausea.       No gerd  Genitourinary:  Negative for dysuria.  Musculoskeletal:  Positive for arthralgias (mild). Negative for back pain.  Skin:  Negative for rash.  Neurological:  Negative for light-headedness and headaches.  Psychiatric/Behavioral:  Positive for dysphoric mood. The patient is nervous/anxious.        Objective:   Vitals:   02/14/22 1044  BP: 128/80  Pulse: 63  Temp: 98.3 F (36.8 C)  SpO2: 97%   Filed  Weights   02/14/22 1044  Weight: 160 lb (72.6 kg)   Body mass index is 28.34 kg/m.  BP Readings from Last 3 Encounters:  02/14/22 128/80  02/11/22 131/70  01/18/22 134/84    Wt Readings from Last 3 Encounters:  02/14/22 160 lb (72.6 kg)  02/11/22 160 lb (72.6 kg)  01/18/22 160 lb (72.6 kg)       Physical Exam Constitutional: She appears well-developed and well-nourished. No distress.  HENT:  Head: Normocephalic and atraumatic.  Right Ear: External ear normal. Normal ear canal and TM Left Ear: External ear normal.  Normal ear canal and TM Mouth/Throat: Oropharynx is clear and moist.  Eyes: Conjunctivae normal.  Neck: Neck supple. No tracheal deviation present. No thyromegaly present.  No carotid bruit  Cardiovascular: Normal rate, regular rhythm and normal heart sounds.   No murmur heard.  No edema. Pulmonary/Chest: Effort normal and breath sounds normal. No respiratory distress. She has no wheezes. She has no rales.  Abdominal: Soft. She exhibits no distension. There is no tenderness.  Lymphadenopathy: She has no cervical adenopathy.  Skin: Skin is warm and dry. She is not diaphoretic.  Psychiatric: She has a normal mood and affect. Her behavior is normal.     Lab Results  Component Value Date   WBC 6.4 01/18/2022   HGB 13.0 01/18/2022   HCT 40.0 01/18/2022   PLT 251.0 01/18/2022   GLUCOSE 85 09/20/2021   CHOL 181 03/22/2021   TRIG 58.0 03/22/2021   HDL 96.50 03/22/2021   LDLCALC 73 03/22/2021   ALT 46 (H) 09/20/2021   AST 40 (H) 09/20/2021   NA 138 09/20/2021   K 3.2 (L) 09/20/2021   CL 104 09/20/2021   CREATININE 0.51 09/20/2021   BUN 13 09/20/2021   CO2 23 09/20/2021   TSH 1.28 09/20/2021   INR 1.0 02/14/2020   HGBA1C 5.3 03/22/2021         Assessment & Plan:      See Problem List for Assessment and Plan of chronic medical problems.

## 2022-02-14 ENCOUNTER — Ambulatory Visit (INDEPENDENT_AMBULATORY_CARE_PROVIDER_SITE_OTHER): Payer: Medicare Other | Admitting: Internal Medicine

## 2022-02-14 ENCOUNTER — Ambulatory Visit (INDEPENDENT_AMBULATORY_CARE_PROVIDER_SITE_OTHER): Payer: Medicare Other

## 2022-02-14 ENCOUNTER — Telehealth: Payer: Self-pay | Admitting: *Deleted

## 2022-02-14 ENCOUNTER — Other Ambulatory Visit: Payer: Self-pay

## 2022-02-14 VITALS — BP 128/80 | HR 63 | Temp 98.3°F | Ht 63.0 in | Wt 160.0 lb

## 2022-02-14 DIAGNOSIS — I480 Paroxysmal atrial fibrillation: Secondary | ICD-10-CM | POA: Diagnosis not present

## 2022-02-14 DIAGNOSIS — Z0000 Encounter for general adult medical examination without abnormal findings: Secondary | ICD-10-CM | POA: Diagnosis not present

## 2022-02-14 DIAGNOSIS — Z01818 Encounter for other preprocedural examination: Secondary | ICD-10-CM | POA: Diagnosis not present

## 2022-02-14 DIAGNOSIS — K219 Gastro-esophageal reflux disease without esophagitis: Secondary | ICD-10-CM

## 2022-02-14 DIAGNOSIS — F1010 Alcohol abuse, uncomplicated: Secondary | ICD-10-CM

## 2022-02-14 DIAGNOSIS — Z01419 Encounter for gynecological examination (general) (routine) without abnormal findings: Secondary | ICD-10-CM | POA: Diagnosis not present

## 2022-02-14 DIAGNOSIS — I10 Essential (primary) hypertension: Secondary | ICD-10-CM

## 2022-02-14 DIAGNOSIS — R739 Hyperglycemia, unspecified: Secondary | ICD-10-CM

## 2022-02-14 DIAGNOSIS — Z1231 Encounter for screening mammogram for malignant neoplasm of breast: Secondary | ICD-10-CM | POA: Diagnosis not present

## 2022-02-14 DIAGNOSIS — M069 Rheumatoid arthritis, unspecified: Secondary | ICD-10-CM

## 2022-02-14 DIAGNOSIS — E039 Hypothyroidism, unspecified: Secondary | ICD-10-CM

## 2022-02-14 DIAGNOSIS — F4323 Adjustment disorder with mixed anxiety and depressed mood: Secondary | ICD-10-CM

## 2022-02-14 LAB — COMPREHENSIVE METABOLIC PANEL
ALT: 17 U/L (ref 0–35)
AST: 24 U/L (ref 0–37)
Albumin: 4.6 g/dL (ref 3.5–5.2)
Alkaline Phosphatase: 56 U/L (ref 39–117)
BUN: 10 mg/dL (ref 6–23)
CO2: 31 mEq/L (ref 19–32)
Calcium: 10.3 mg/dL (ref 8.4–10.5)
Chloride: 94 mEq/L — ABNORMAL LOW (ref 96–112)
Creatinine, Ser: 0.62 mg/dL (ref 0.40–1.20)
GFR: 91.25 mL/min (ref >60.00)
Glucose, Bld: 82 mg/dL (ref 70–99)
Potassium: 3.9 mEq/L (ref 3.5–5.1)
Sodium: 137 mEq/L (ref 135–145)
Total Bilirubin: 0.5 mg/dL (ref 0.2–1.2)
Total Protein: 7 g/dL (ref 6.0–8.3)

## 2022-02-14 MED ORDER — TRIAMCINOLONE ACETONIDE 0.5 % EX OINT
1.0000 | TOPICAL_OINTMENT | Freq: Two times a day (BID) | CUTANEOUS | 0 refills | Status: DC
Start: 2022-02-14 — End: 2022-12-26

## 2022-02-14 NOTE — Assessment & Plan Note (Addendum)
Chronic Paroxysmal Following with cardiology On Eliquis 5 mg twice daily Rate controlled CMP

## 2022-02-14 NOTE — Telephone Encounter (Signed)
  Follow up Call-     02/11/2022    1:58 PM  Call back number  Post procedure Call Back phone  # [phone]  Permission to leave phone message Yes     Patient questions:  Do you have a fever, pain , or abdominal swelling? No. Pain Score  0 *  Have you tolerated food without any problems? Yes.    Have you been able to return to your normal activities? Yes.    Do you have any questions about your discharge instructions: Diet   No. Medications  No. Follow up visit  No.  Do you have questions or concerns about your Care? No.  Actions: * If pain score is 4 or above: No action needed, pain <4.

## 2022-02-14 NOTE — Assessment & Plan Note (Addendum)
Chronic Fairly controlled-still dealing with the loss of her 2 dogs in the divorce Continue Effexor 150 mg daily Has done some therapy and will try to reestablish with her therapist

## 2022-02-14 NOTE — Patient Instructions (Addendum)
It was great speaking with you today!!  As discussed, please send or bring [place] a copy of your living will/power of attorney. If you decide to mail it, our address is  [address] [place], [place] [address]  We will also remind you the week of October 16th to see if you would like for [place] to send in your Tdap/Tetanus vaccine to your pharmacy.    Please schedule your next Medicare Wellness Visit with your Nurse Health Advisor in 1 year by calling [phone].

## 2022-02-14 NOTE — Progress Notes (Signed)
Subjective:   Julia [name] is a 71 y.o. female who presents for an Initial Medicare Annual Wellness Visit.  Review of Systems    No ROS. Medicare Wellness Visit. Additional risk factors are reflected in social history. Cardiac Risk Factors include: advanced age (>64mn, >>69women)     Objective:    Today's Vitals   02/14/22 1132  BP: 128/80  Pulse: 63  Temp: 98.3 F (36.8 C)  TempSrc: Oral  SpO2: 97%  Weight: 160 lb (72.6 kg)  Height: '5\' 3"'$  (1.6 m)   Body mass index is 28.34 kg/m.     02/14/2022   11:37 AM 06/19/2020    8:00 AM 05/27/2020   10:56 AM  Advanced Directives  Does Patient Have a Medical Advance Directive? Yes Yes Yes  Type of Advance Directive Living will;Healthcare Power of Attorney Living will;Healthcare Power of Attorney Living will  Does patient want to make changes to medical advance directive? No - Patient declined    Copy of [place]? No - copy requested      Current Medications (verified) Outpatient Encounter Medications as of 02/14/2022  Medication Sig   amLODipine (NORVASC) 2.5 MG tablet Take 1 tablet (2.5 mg total) by mouth daily.   apixaban (ELIQUIS) 5 MG TABS tablet Take 1 tablet (5 mg total) by mouth 2 (two) times daily.   Ascorbic Acid (VITAMIN C PO) Take 3,200 mg by mouth daily. 1600 mg each   Calcium Citrate-Vitamin D (CALCIUM + D PO) Take 1 capsule by mouth 2 (two) times daily.   Cholecalciferol (VITAMIN D3) 250 MCG (10000 UT) TABS Take 5,000 Units by mouth 2 (two) times daily.    Coenzyme Q10 (COQ-10) 100 MG CAPS Take 200 mg by mouth daily.   Krill Oil 1000 MG CAPS Take 1,000 mg by mouth daily.   levothyroxine (SYNTHROID) 25 MCG tablet TAKE 1 TABLET(25 MCG) BY MOUTH DAILY   loratadine (CLARITIN) 10 MG tablet Take 1 tablet by mouth daily.   Misc Natural Products (CVS GLUCOS-CHONDROIT-MSM TS) TABS Take 2 tablets by mouth at bedtime.   Multiple Vitamins-Minerals (CENTRUM SILVER PO) Take 1 tablet by mouth daily.     Omega-3 Fatty Acids (FISH OIL) 1000 MG CAPS Take 1,000 mg by mouth daily.   orlistat (ALLI) 60 MG capsule Take 60 mg by mouth 3 (three) times daily with meals.    OVER THE COUNTER MEDICATION Take 1,040 mg by mouth daily. Liposomal otc supplement   OZEMPIC, 2 MG/DOSE, 8 MG/3ML SOPN Inject 2 mg into the skin once a week.   Probiotic Product (PROBIOTIC PO) Take 1 capsule by mouth daily.   pyridOXINE (VITAMIN B-6) 100 MG tablet Take 200 mg by mouth daily.   riTUXimab (RITUXAN) 500 MG/50ML injection Inject 10 mg into the vein every 6 (six) months.   triamcinolone ointment (KENALOG) 0.5 % Apply 1 Application topically 2 (two) times daily.   UNABLE TO FIND Take 2 capsules by mouth daily. Med Name: Monolaurin 936 mg and Zinc 15 mg   valACYclovir (VALTREX) 500 MG tablet TAKE 1 TABLET(500 MG) BY MOUTH DAILY   venlafaxine XR (EFFEXOR-XR) 150 MG 24 hr capsule Take 1 capsule (150 mg total) by mouth daily with breakfast.   zinc gluconate 50 MG tablet Take 50 mg by mouth every Monday, Wednesday, and Friday.   Facility-Administered Encounter Medications as of 02/14/2022  Medication   sodium chloride flush (NS) 0.9 % injection 10 mL    Allergies (verified) Methotrexate derivatives  History: Past Medical History:  Diagnosis Date   Adjustment disorder with mixed anxiety and depressed mood    Arthritis    Depression    GERD (gastroesophageal reflux disease) 01/13/2017   Hemangioma 04/18/2017   History of breast augmentation    History of colon polyps 01/14/2017   Last colonoscopy 2018 - no polyps - advised 5 years   History of gastric bypass    History of pelvis fracture    in 30's   History of tuberculosis exposure 01/14/2017   Hx of latent TB, treated for 9 months   Hyperglycemia 09/28/2020   Hypertension    Hypothyroidism 01/14/2017   Melanocytic nevi, unspecified 04/18/2017   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   Obesity    Osteoporosis 01/14/2017   07/2018 - spine  -2.3,  RFN  -1.9,   LFN  -2.3   - improvement in hips dexa 07/25/17 - spine  -3.1,   RFN  -2.2,  LFN  -2.4  - started on prolia DEXA 05/13/2010 in [place], osteopenia   Spine: -2.3   LFN  -1.7   RFN:  -1.8     FRAX  15%, 1.9%    Paroxysmal atrial fibrillation 09/29/2020   PFO with atrial septal aneurysm 06/19/2020   Plant dermatitis 01/08/2020   Pneumonia    Purpura 04/18/2017   Rheumatoid arthritis 01/13/2017   Following at [facility] rheumatology  Has been on Prednisone, Plaquenil, Arava, Humira, etolodac, Enbrel, Orencia, Remicade and Rituxan     Sebaceous cyst 04/18/2017   TIA (transient ischemic attack) 04/22/2020   Ventral hernia without obstruction or gangrene 11/01/2019   Noted on Ct scan 10/2019   Vitamin D deficiency 06/22/2017   Past Surgical History:  Procedure Laterality Date   APPENDECTOMY     BREAST ENHANCEMENT SURGERY     BUBBLE STUDY  05/27/2020   Procedure: BUBBLE STUDY;  Surgeon: [name], MD;  Location: [place];  Service: Cardiovascular;;   GASTRIC BYPASS     PATENT FORAMEN OVALE(PFO) CLOSURE N/A 06/19/2020   Procedure: PATENT FORAMEN OVALE (PFO) CLOSURE;  Surgeon: [name], MD;  Location: [place] [facility];  Service: Cardiovascular;  Laterality: N/A;   TEE WITHOUT CARDIOVERSION N/A 05/27/2020   Procedure: TRANSESOPHAGEAL ECHOCARDIOGRAM (TEE);  Surgeon: [name], MD;  Location: [place];  Service: Cardiovascular;  Laterality: N/A;   TONSILLECTOMY     Family History  Problem Relation Age of Onset   Hypertension Mother    Heart disease Mother    Heart failure Mother    Hypertension Father    Ulcers Father    Gallstones Father    Kidney Stones Father    Hypertension Sister    Hypertension Brother    Heart attack Maternal Grandmother    Colon cancer Neg Hx    Stomach cancer Neg Hx    Esophageal cancer Neg Hx    Social History   Socioeconomic History   Marital status: Divorced    Spouse name: Not on file   Number of children: 0   Years  of education: 12   Highest education level: High school graduate  Occupational History   Occupation: retired    Comment: president of [employer]  Tobacco Use   Smoking status: Never   Smokeless tobacco: Never  Vaping Use   Vaping Use: Never used  Substance and Sexual Activity   Alcohol use: Yes    Alcohol/week: 3.0 - 5.0 standard drinks of alcohol    Types: 3 - 5 Glasses of  wine per week    Comment: per week   Drug use: Never   Sexual activity: Not on file  Other Topics Concern   Not on file  Social History Narrative   Retired 07/10/17 from [employer]   No regular exercise      Lives at home with husband   Caffeine: 2 glasses/day diet dr pepper   Social Determinants of Health   Financial Resource Strain: Low Risk  (02/14/2022)   Overall Financial Resource Strain (CARDIA)    Difficulty of Paying Living Expenses: Not hard at all  Food Insecurity: No Food Insecurity (02/14/2022)   Hunger Vital Sign    Worried About Running Out of Food in the Last Year: Never true    Ran Out of Food in the Last Year: Never true  Transportation Needs: No Transportation Needs (02/14/2022)   PRAPARE - Hydrologist (Medical): No    Lack of Transportation (Non-Medical): No  Physical Activity: Insufficiently Active (02/14/2022)   Exercise Vital Sign    Days of Exercise per Week: 3 days    Minutes of Exercise per Session: 30 min  Stress: Stress Concern Present (02/14/2022)   [place]    Feeling of Stress : To some extent  Social Connections: Socially Isolated (02/14/2022)   Social Connection and Isolation Panel [NHANES]    Frequency of Communication with Friends and Family: More than three times a week    Frequency of Social Gatherings with Friends and Family: Twice a week    Attends Religious Services: Never    Marine scientist or Organizations: No    Attends Music therapist: Never    Marital Status:  Divorced    Tobacco Counseling N/A   Clinical Intake:  Pre-visit preparation completed: Yes  Pain : No/denies pain    Nutritional Risks: None Diabetes: No  How often do you need to have someone help you when you read instructions, pamphlets, or other written materials from your doctor or pharmacy?: 1 - Never What is the last grade level you completed in school?: 12th grade  Diabetic?no  Interpreter Needed?: No  Information entered by :: Jillene Bucks, [place]   Activities of Daily Living    02/14/2022   11:38 AM  In your present state of health, do you have any difficulty performing the following activities:  Hearing? 0  Vision? 0  Difficulty concentrating or making decisions? 0  Walking or climbing stairs? 0  Dressing or bathing? 0  Doing errands, shopping? 0  Preparing Food and eating ? N  Using the Toilet? N  In the past six months, have you accidently leaked urine? N  Do you have problems with loss of bowel control? N  Managing your Medications? N  Managing your Finances? N  Housekeeping or managing your Housekeeping? N    Patient Care Team: [name], MD as PCP - General (Internal Medicine) [name], MD as PCP - Cardiology (Cardiology) [place], Darnelle Maffucci, [facility] (Inactive) as Pharmacist (Pharmacist)  Indicate any recent Medical Services you may have received from other than [facility] providers in the past year (date may be approximate).     Assessment:   This is a routine wellness examination for Jewelz.  Hearing/Vision screen Patient denied any hearing difficulty. No hearing aids. Patient uses reading glasses.   Dietary issues and exercise activities discussed: Current Exercise Habits: Home exercise routine, Type of exercise: treadmill, Time (Minutes): 30, Frequency (  Times/Week): 3, Weekly Exercise (Minutes/Week): 90, Intensity: Mild, Exercise limited by: None identified   Goals Addressed               This Visit's Progress     Patient Stated  (pt-stated)        Patient states she has a goal of completing 10,000 steps a day.       Depression Screen    02/14/2022   11:34 AM 12/22/2021   10:09 AM 09/20/2021    3:06 PM 09/02/2019    9:50 AM 06/22/2017    8:25 AM  PHQ 2/9 Scores  PHQ - 2 Score 1 2 0 0 0  PHQ- 9 Score  6       Fall Risk    02/14/2022   11:37 AM 12/22/2021   10:08 AM 09/20/2021    3:05 PM 09/29/2020   11:22 AM 09/02/2019    9:49 AM  Fall Risk   Falls in the past year? 0 1 0 0 0  Number falls in past yr: 0 1 0 0 0  Injury with Fall? 0 0 0 0 0  Risk for fall due to : No Fall Risks No Fall Risks No Fall Risks No Fall Risks   Follow up Falls evaluation completed Falls evaluation completed Falls evaluation completed Falls evaluation completed     FALL RISK PREVENTION PERTAINING TO THE HOME:  Any stairs in or around the home? Yes  If so, are there any without handrails? No  Home free of loose throw rugs in walkways, pet beds, electrical cords, etc? Yes  Adequate lighting in your home to reduce risk of falls? Yes   ASSISTIVE DEVICES UTILIZED TO PREVENT FALLS:  Life alert? Yes has an Alexa Use of a cane, walker or w/c? No  Grab bars in the bathroom? Yes  Shower chair or bench in shower? Yes  Elevated toilet seat or a handicapped toilet? No   TIMED UP AND GO:  Was the test performed? No .  Length of time to ambulate 10 feet: N/A sec.   Patient stated that she has no issues with gait or balance; does not use any assistive devices.  Cognitive Function:  Patient is cogitatively intact.      02/14/2022   11:38 AM  6CIT Screen  What Year? 0 points  What month? 0 points  What time? 0 points  Count back from 20 0 points  Months in reverse 0 points  Repeat phrase 0 points  Total Score 0 points    Immunizations Immunization History  Administered Date(s) Administered   Fluad Quad(high Dose 65+) 03/21/2019, 04/28/2021   Influenza, High Dose Seasonal PF 05/30/2018   Influenza-Unspecified 05/11/2017    PFIZER(Purple Top)SARS-COV-2 Vaccination 08/02/2019, 08/22/2019, 03/13/2021   PNEUMOCOCCAL CONJUGATE-20 04/28/2021   Pneumococcal Conjugate-13 07/17/2018   Pneumococcal Polysaccharide-23 04/07/2010, 10/29/2019   Tdap 10/19/2011   Zoster Recombinat (Shingrix) 07/25/2017, 10/10/2017   Zoster, Live 09/21/2005    TDAP status: Due, Education has been provided regarding the importance of this vaccine. Advised may receive this vaccine at local pharmacy or Health Dept. Aware to provide a copy of the vaccination record if obtained from local pharmacy or Health Dept. Verbalized acceptance and understanding. Patient wishes to receive this the week of October 16th due to her injections that she receives.   Flu Vaccine status: Due, Education has been provided regarding the importance of this vaccine. Advised may receive this vaccine at local pharmacy or Health Dept. Aware to provide a copy  of the vaccination record if obtained from local pharmacy or Health Dept. Verbalized acceptance and understanding.  Pneumococcal vaccine status: Up to date  Covid-19 vaccine status: Completed vaccines  Qualifies for Shingles Vaccine? Yes   Zostavax completed No   Shingrix Completed?: Yes  Screening Tests Health Maintenance  Topic Date Due   INFLUENZA VACCINE  02/08/2022   COVID-19 Vaccine (4 - Booster for Pfizer series) 03/02/2022 (Originally 05/08/2021)   TETANUS/TDAP  02/15/2023 (Originally 10/18/2021)   MAMMOGRAM  01/22/2023   DEXA SCAN  10/23/2023   COLONOSCOPY (Pts 45-94yr Insurance coverage will need to be confirmed)  02/12/2032   Pneumonia Vaccine 71 Years old  Completed   Hepatitis C Screening  Completed   Zoster Vaccines- Shingrix  Completed   HPV VACCINES  Aged Out    Health Maintenance  Health Maintenance Due  Topic Date Due   INFLUENZA VACCINE  02/08/2022    Colorectal cancer screening: Type of screening: Colonoscopy. Completed 02/11/22. Repeat every 10 years  Mammogram status: Ordered  09/20/21. Pt provided with contact info and advised to call to schedule appt.   Bone Density status: Completed 10/22/21. Results reflect: Bone density results: NORMAL. Repeat every 2 years.  Lung Cancer Screening: (Low Dose CT Chest recommended if Age 69-80years, 30 pack-year currently smoking OR have quit w/in 15years.) does not qualify.   Lung Cancer Screening Referral: n/a  Additional Screening:  Hepatitis C Screening: does not qualify; Completed 02/25/2016  Vision Screening: Recommended annual ophthalmology exams for early detection of glaucoma and other disorders of the eye. Is the patient up to date with their annual eye exam?  Yes  Who is the provider or what is the name of the office in which the patient attends annual eye exams? [facility] If pt is not established with a provider, would they like to be referred to a provider to establish care? No .   Dental Screening: Recommended annual dental exams for proper oral hygiene  Community Resource Referral / Chronic Care Management: CRR required this visit?  No   CCM required this visit?  No      Plan:     I have personally reviewed and noted the following in the patient's chart:   Medical and social history Use of alcohol, tobacco or illicit drugs  Current medications and supplements including opioid prescriptions. Patient is not currently taking opioid prescriptions. Functional ability and status Nutritional status Physical activity Advanced directives List of other physicians Hospitalizations, surgeries, and ER visits in previous 12 months Vitals Screenings to include cognitive, depression, and falls Referrals and appointments  In addition, I have reviewed and discussed with patient certain preventive protocols, quality metrics, and best practice recommendations. A written personalized care plan for preventive services as well as general preventive health recommendations were provided to patient.     HRossie Muskrat  CMA   02/14/2022   Nurse Notes:   Patient wishes to wait to receive her Tdap vaccine until the week of October 16th when her immunity is at it's peak due to her injections.   Discussed with patient that she will get [place] copy of her Living Will and Power of Attorney.

## 2022-02-14 NOTE — Assessment & Plan Note (Signed)
Chronic Controlled Management per [facility] rheumatology

## 2022-02-14 NOTE — Assessment & Plan Note (Addendum)
Chronic Last A1c was very normal Low sugar / carb diet Stressed regular exercise

## 2022-02-14 NOTE — Assessment & Plan Note (Signed)
Improved She has cut down significantly and is much more conscious of this She is trying to do some the depression and grieving that she has been going through

## 2022-02-14 NOTE — Assessment & Plan Note (Signed)
Pre-op for removal of b/l breast implants Chronic medical problems stable Hypertension controlled, sugars controlled To see cardio for clearance - on eliquis No concerning symptoms suggestive of CAD, respiratory illness  Labs today - cmp  Low risk for low risk surgery - cleared for surgery.

## 2022-02-14 NOTE — Assessment & Plan Note (Addendum)
Chronic  Clinically euthyroid Continue taking levothyroxine  25 mcg daily

## 2022-02-14 NOTE — Assessment & Plan Note (Signed)
Chronic Blood pressure well controlled CMP Continue amlodipine 2.5 mg daily 

## 2022-02-15 ENCOUNTER — Encounter: Payer: Self-pay | Admitting: Cardiovascular Disease

## 2022-02-15 ENCOUNTER — Ambulatory Visit (INDEPENDENT_AMBULATORY_CARE_PROVIDER_SITE_OTHER): Payer: Medicare Other | Admitting: Cardiovascular Disease

## 2022-02-15 ENCOUNTER — Telehealth: Payer: Self-pay

## 2022-02-15 VITALS — BP 112/78 | HR 81 | Ht 63.0 in | Wt 160.0 lb

## 2022-02-15 DIAGNOSIS — I480 Paroxysmal atrial fibrillation: Secondary | ICD-10-CM | POA: Diagnosis not present

## 2022-02-15 DIAGNOSIS — I10 Essential (primary) hypertension: Secondary | ICD-10-CM | POA: Diagnosis not present

## 2022-02-15 DIAGNOSIS — Z01810 Encounter for preprocedural cardiovascular examination: Secondary | ICD-10-CM

## 2022-02-15 DIAGNOSIS — Q2112 Patent foramen ovale: Secondary | ICD-10-CM

## 2022-02-15 NOTE — Progress Notes (Signed)
Cardiology Office Note:    Date:  02/15/2022   ID:  Julia [name], DOB March 23, 1951, MRN [id number]  PCP:  [name], MD   [place] Providers Cardiologist:  [name], MD     Referring MD: [name], MD   Chief Complaint  Patient presents with   pfo    History of Present Illness:    Julia [name] is a 71 y.o. female with a hx of: Patent Foramen Ovale  S/p closure in 12/21 Paroxysmal atrial fibrillation on chronic [place] with apixaban Rheumatoid arthritis Hx of TIA  Hypertension  Hx of bariatric surgery   The patient is here alone today.  She is doing well from a cardiac perspective and denies chest pain, chest pressure, shortness of breath, or palpitations.  She continues on apixaban for anticoagulation and reports no specific bleeding problems.  She has an upcoming surgery with plans to remove all breast implants.  Her surgery will be performed at [facility].  She requires preoperative cardiac clearance today.  Past Medical History:  Diagnosis Date   Adjustment disorder with mixed anxiety and depressed mood    Arthritis    Depression    GERD (gastroesophageal reflux disease) 01/13/2017   Hemangioma 04/18/2017   History of breast augmentation    History of colon polyps 01/14/2017   Last colonoscopy 2018 - no polyps - advised 5 years   History of gastric bypass    History of pelvis fracture    in 30's   History of tuberculosis exposure 01/14/2017   Hx of latent TB, treated for 9 months   Hyperglycemia 09/28/2020   Hypertension    Hypothyroidism 01/14/2017   Melanocytic nevi, unspecified 04/18/2017   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   Obesity    Osteoporosis 01/14/2017   07/2018 - spine  -2.3,  RFN  -1.9,  LFN  -2.3   - improvement in hips dexa 07/25/17 - spine  -3.1,   RFN  -2.2,  LFN  -2.4  - started on prolia DEXA 05/13/2010 in [place], osteopenia   Spine: -2.3   LFN  -1.7   RFN:  -1.8     FRAX  15%, 1.9%    Paroxysmal atrial fibrillation  09/29/2020   PFO with atrial septal aneurysm 06/19/2020   Plant dermatitis 01/08/2020   Pneumonia    Purpura 04/18/2017   Rheumatoid arthritis 01/13/2017   Following at [facility]ology  Has been on Prednisone, Plaquenil, Arava, Humira, etolodac, Enbrel, Orencia, Remicade and Rituxan     Sebaceous cyst 04/18/2017   TIA (transient ischemic attack) 04/22/2020   Ventral hernia without obstruction or gangrene 11/01/2019   Noted on Ct scan 10/2019   Vitamin D deficiency 06/22/2017    Past Surgical History:  Procedure Laterality Date   APPENDECTOMY     BREAST ENHANCEMENT SURGERY     BUBBLE STUDY  05/27/2020   Procedure: BUBBLE STUDY;  Surgeon: [name], MD;  Location: [place];  Service: Cardiovascular;;   GASTRIC BYPASS     PATENT FORAMEN OVALE(PFO) CLOSURE N/A 06/19/2020   Procedure: PATENT FORAMEN OVALE (PFO) CLOSURE;  Surgeon: [name], MD;  Location: [place] [facility];  Service: Cardiovascular;  Laterality: N/A;   TEE WITHOUT CARDIOVERSION N/A 05/27/2020   Procedure: TRANSESOPHAGEAL ECHOCARDIOGRAM (TEE);  Surgeon: [name], MD;  Location: [place];  Service: Cardiovascular;  Laterality: N/A;   TONSILLECTOMY      Current Medications: Current Meds  Medication Sig   amLODipine (NORVASC) 2.5 MG tablet  Take 1 tablet (2.5 mg total) by mouth daily.   apixaban (ELIQUIS) 5 MG TABS tablet Take 1 tablet (5 mg total) by mouth 2 (two) times daily.   Ascorbic Acid (VITAMIN C PO) Take 3,200 mg by mouth daily. 1600 mg each   Calcium Citrate-Vitamin D (CALCIUM + D PO) Take 1 capsule by mouth 2 (two) times daily.   Cholecalciferol (VITAMIN D3) 250 MCG (10000 UT) TABS Take 5,000 Units by mouth 2 (two) times daily.    Coenzyme Q10 (COQ-10) 100 MG CAPS Take 200 mg by mouth daily.   Krill Oil 1000 MG CAPS Take 1,000 mg by mouth daily.   levothyroxine (SYNTHROID) 25 MCG tablet TAKE 1 TABLET(25 MCG) BY MOUTH DAILY   loratadine (CLARITIN) 10 MG tablet Take 1  tablet by mouth daily.   Misc Natural Products ([employer] GLUCOS-CHONDROIT-MSM TS) TABS Take 2 tablets by mouth at bedtime.   Multiple Vitamins-Minerals (CENTRUM SILVER PO) Take 1 tablet by mouth daily.    Omega-3 Fatty Acids (FISH OIL) 1000 MG CAPS Take 1,000 mg by mouth daily.   orlistat (ALLI) 60 MG capsule Take 60 mg by mouth 3 (three) times daily with meals.    OVER THE COUNTER MEDICATION Take 1,040 mg by mouth daily. Liposomal otc supplement   OZEMPIC, 2 MG/DOSE, 8 MG/3ML SOPN Inject 2 mg into the skin once a week.   Probiotic Product (PROBIOTIC PO) Take 1 capsule by mouth daily.   pyridOXINE (VITAMIN B-6) 100 MG tablet Take 200 mg by mouth daily.   riTUXimab (RITUXAN) 500 MG/50ML injection Inject 10 mg into the vein every 6 (six) months.   triamcinolone ointment (KENALOG) 0.5 % Apply 1 Application topically 2 (two) times daily.   UNABLE TO FIND Take 2 capsules by mouth daily. Med Name: Monolaurin 936 mg and Zinc 15 mg   valACYclovir (VALTREX) 500 MG tablet TAKE 1 TABLET(500 MG) BY MOUTH DAILY   venlafaxine XR (EFFEXOR-XR) 150 MG 24 hr capsule Take 1 capsule (150 mg total) by mouth daily with breakfast.   zinc gluconate 50 MG tablet Take 50 mg by mouth every Monday, Wednesday, and Friday.     Allergies:   Methotrexate derivatives   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[employer]  Tobacco Use   Smoking status: Never   Smokeless tobacco: Never  Vaping Use   Vaping Use: Never used  Substance and Sexual Activity   Alcohol use: Yes    Alcohol/week: 3.0 - 5.0 standard drinks of alcohol    Types: 3 - 5 Glasses of wine per week    Comment: per week   Drug use: Never   Sexual activity: Not on file  Other Topics Concern   Not on file  Social History Narrative   Retired  07/10/17 from [employer]   No regular exercise      Lives at home with husband   Caffeine: 2 glasses/day diet dr pepper   Social Determinants of Health   Financial Resource Strain: Low Risk  (02/14/2022)   Overall Financial Resource Strain (CARDIA)    Difficulty of Paying Living Expenses: Not hard at all  Food Insecurity: No Food Insecurity (02/14/2022)   Hunger Vital Sign    Worried About Running Out of Food  in the Last Year: Never true    [place] in the Last Year: Never true  Transportation Needs: No Transportation Needs (02/14/2022)   PRAPARE - Hydrologist (Medical): No    Lack of Transportation (Non-Medical): No  Physical Activity: Insufficiently Active (02/14/2022)   Exercise Vital Sign    Days of Exercise per Week: 3 days    Minutes of Exercise per Session: 30 min  Stress: Stress Concern Present (02/14/2022)   [place]    Feeling of Stress : To some extent  Social Connections: Socially Isolated (02/14/2022)   Social Connection and Isolation Panel [NHANES]    Frequency of Communication with Friends and Family: More than three times a week    Frequency of Social Gatherings with Friends and Family: Twice a week    Attends Religious Services: Never    Marine scientist or Organizations: No    Attends Music therapist: Never    Marital Status: Divorced     Family History: The patient's family history includes Gallstones in her father; Heart attack in her maternal grandmother; Heart disease in her mother; Heart failure in her mother; Hypertension in her brother, father, mother, and sister; Kidney Stones in her father; Ulcers in her father. There is no history of Colon cancer, Stomach cancer, or Esophageal cancer.  ROS:   Please see the history of present illness.    All other systems reviewed and are negative.  EKGs/Labs/Other Studies Reviewed:    The following studies  were reviewed today: Limited Echo 07/16/2021:  1. Patient is s/p PFO closure with 6m Amplatzer PFO occluder device on  06/19/2020. There is no residual shunting on agitated saline contrast  study.   2. Left ventricular ejection fraction, by estimation, is 60 to 65%. Left  ventricular ejection fraction by 3D volume is 62 %. The left ventricle has  normal function. The left ventricle has no regional wall motion  abnormalities. Left ventricular diastolic   parameters are consistent with Grade I diastolic dysfunction (impaired  relaxation). The average left ventricular global longitudinal strain is  -26.2 %. The global longitudinal strain is normal.   3. Right ventricular systolic function is normal. The right ventricular  size is normal.   4. The mitral valve is normal in structure. Trivial mitral valve  regurgitation.   5. The inferior vena cava is normal in size with greater than 50%  respiratory variability, suggesting right atrial pressure of 3 mmHg.   6. Agitated saline contrast bubble study was negative, with no evidence  of any interatrial shunt.   Comparison(s): Compared to prior TTE on 06/19/20, the patient is s/p PFO  closure with negative bubble study currently.   EKG:  EKG is ordered today.  The ekg ordered today demonstrates normal sinus rhythm 75 bpm, voltage criteria for LVH may be normal variant, otherwise normal tracing  Recent Labs: 09/20/2021: TSH 1.28 01/18/2022: Hemoglobin 13.0; Platelets 251.0 02/14/2022: ALT 17; BUN 10; Creatinine, Ser 0.62; Potassium 3.9; Sodium 137  Recent Lipid Panel    Component Value Date/Time   CHOL 181 03/22/2021 1210   TRIG 58.0 03/22/2021 1210   HDL 96.50 03/22/2021 1210   CHOLHDL 2 03/22/2021 1210   VLDL 11.6 03/22/2021 1210   LDLCALC 73 03/22/2021 1210     Risk Assessment/Calculations:    CHA2DS2-VASc Score = 5   This indicates a 7.2% annual risk of stroke. The patient's score  is based upon: CHF History: 0 HTN History:  1 Diabetes History: 0 Stroke History: 2 Vascular Disease History: 0 Age Score: 1 Gender Score: 1          Physical Exam:    VS:  BP 112/78   Pulse 81   Ht '5\' 3"'$  (1.6 m)   Wt 160 lb (72.6 kg)   SpO2 97%   BMI 28.34 kg/m     Wt Readings from Last 3 Encounters:  02/15/22 160 lb (72.6 kg)  02/14/22 160 lb (72.6 kg)  02/14/22 160 lb (72.6 kg)     GEN:  Well nourished, well developed in no acute distress HEENT: Normal NECK: No JVD; No carotid bruits LYMPHATICS: No lymphadenopathy CARDIAC: RRR, no murmurs, rubs, gallops RESPIRATORY:  Clear to auscultation without rales, wheezing or rhonchi  ABDOMEN: Soft, non-tender, non-distended MUSCULOSKELETAL:  No edema; No deformity  SKIN: Warm and dry NEUROLOGIC:  Alert and oriented x 3 PSYCHIATRIC:  Normal affect   ASSESSMENT:    1. PFO (patent foramen ovale)   2. PAF (paroxysmal atrial fibrillation) ([place])   3. Primary hypertension   4. Preop cardiovascular exam    PLAN:    In order of problems listed above:  I reviewed the patient's echo images from January and her PFO occluder device is in stable position.  The patient's agitated saline study is negative for any residual right to left shunt. The patient has a very low burden of atrial fibrillation based on previous monitoring.  She has no symptoms of heart palpitations.  A-fib burden was less than 1%.  She continues on apixaban with no bleeding problems. Blood pressure well-controlled on amlodipine 2.5 mg daily. The patient reports no cardiac symptoms at a good workload, clearly able to achieve well above 4 metabolic equivalents with no angina, shortness of breath, or symptoms of arrhythmia.  She is at appropriate risk of holding apixaban 48 hours before surgery or 72 hours if felt to be at excessive bleeding risk. She should resume apixaban when felt to be safe postoperatively based on surgical recommendations.  Otherwise she can proceed without further cardiac testing at this  time.  I will see her back in 1 year for follow-up evaluation.     Medication Adjustments/Labs and Tests Ordered: Current medicines are reviewed at length with the patient today.  Concerns regarding medicines are outlined above.  No orders of the defined types were placed in this encounter.  No orders of the defined types were placed in this encounter.   Patient Instructions  Medication Instructions:  Your physician recommends that you continue on your current medications as directed. Please refer to the Current Medication list given to you today.  *If you need a refill on your cardiac medications before your next appointment, please call your pharmacy*   Lab Work: None ordered  If you have labs (blood work) drawn today and your tests are completely normal, you will receive your results only by: [place] (if you have MyChart) OR A paper copy in the mail If you have any lab test that is abnormal or we need to change your treatment, we will call you to review the results.   Testing/Procedures: None ordered   Follow-Up: At [facility], you and your health needs are our priority.  As part of our continuing mission to provide you with exceptional heart care, we have created designated Provider Care Teams.  These Care Teams include your primary Cardiologist (physician) and Advanced Practice Providers (APPs -  Physician Assistants and Nurse Practitioners) who all work together to provide you with the care you need, when you need it.  We recommend signing up for the patient portal called "MyChart".  Sign up information is provided on this After Visit Summary.  MyChart is used to connect with patients for Virtual Visits (Telemedicine).  Patients are able to view lab/test results, encounter notes, upcoming appointments, etc.  Non-urgent messages can be sent to your provider as well.   To learn more about what you can do with MyChart, go to [email or web address].    Your next  appointment:   1 month(s)  The format for your next appointment:   In Person  Provider:   [name], MD     Other Instructions   Important Information About Sugar         Signed, [name], MD  02/15/2022 10:30 AM    [place]

## 2022-02-15 NOTE — Telephone Encounter (Signed)
Called today and left message today regarding clearance.  [phone] Lenis Noon)

## 2022-02-15 NOTE — Patient Instructions (Signed)
Medication Instructions:  Your physician recommends that you continue on your current medications as directed. Please refer to the Current Medication list given to you today.  *If you need a refill on your cardiac medications before your next appointment, please call your pharmacy*   Lab Work: None ordered  If you have labs (blood work) drawn today and your tests are completely normal, you will receive your results only by: [place] (if you have MyChart) OR A paper copy in the mail If you have any lab test that is abnormal or we need to change your treatment, we will call you to review the results.   Testing/Procedures: None ordered   Follow-Up: At [facility], you and your health needs are our priority.  As part of our continuing mission to provide you with exceptional heart care, we have created designated Provider Care Teams.  These Care Teams include your primary Cardiologist (physician) and Advanced Practice Providers (APPs -  Physician Assistants and Nurse Practitioners) who all work together to provide you with the care you need, when you need it.  We recommend signing up for the patient portal called "MyChart".  Sign up information is provided on this After Visit Summary.  MyChart is used to connect with patients for Virtual Visits (Telemedicine).  Patients are able to view lab/test results, encounter notes, upcoming appointments, etc.  Non-urgent messages can be sent to your provider as well.   To learn more about what you can do with MyChart, go to [email or web address].    Your next appointment:   1 month(s)  The format for your next appointment:   In Person  Provider:   [name], MD     Other Instructions   Important Information About Sugar

## 2022-02-16 DIAGNOSIS — M9902 Segmental and somatic dysfunction of thoracic region: Secondary | ICD-10-CM | POA: Diagnosis not present

## 2022-02-16 DIAGNOSIS — M9901 Segmental and somatic dysfunction of cervical region: Secondary | ICD-10-CM | POA: Diagnosis not present

## 2022-02-16 DIAGNOSIS — M47814 Spondylosis without myelopathy or radiculopathy, thoracic region: Secondary | ICD-10-CM | POA: Diagnosis not present

## 2022-02-16 DIAGNOSIS — M47812 Spondylosis without myelopathy or radiculopathy, cervical region: Secondary | ICD-10-CM | POA: Diagnosis not present

## 2022-02-16 DIAGNOSIS — M9903 Segmental and somatic dysfunction of lumbar region: Secondary | ICD-10-CM | POA: Diagnosis not present

## 2022-02-16 DIAGNOSIS — M9904 Segmental and somatic dysfunction of sacral region: Secondary | ICD-10-CM | POA: Diagnosis not present

## 2022-02-16 DIAGNOSIS — M4728 Other spondylosis with radiculopathy, sacral and sacrococcygeal region: Secondary | ICD-10-CM | POA: Diagnosis not present

## 2022-02-16 DIAGNOSIS — M47816 Spondylosis without myelopathy or radiculopathy, lumbar region: Secondary | ICD-10-CM | POA: Diagnosis not present

## 2022-02-16 NOTE — Addendum Note (Signed)
Addended by: Janan Halter F on: 02/16/2022 12:20 PM   Modules accepted: Orders

## 2022-02-17 NOTE — Telephone Encounter (Signed)
Pt ready for scheduling on or after 03/24/22   Out-of-pocket cost due at time of visit: $0   Primary: Medicare [place] co-insurance: 20% (approximately $276) Admin fee co-insurance: 20% (approximately $25)   Secondary: U[employer] AARP Medicare Supp [place] co-insurance: Covers Medicare Part B co-insurance and deductible.  Admin fee co-insurance: Covers Medicare Part B co-insurance and deductible.    Deductible: covered by secondary   Prior Auth: not required PA# Valid:    ** This summary of benefits is an estimation of the patient's out-of-pocket cost. Exact cost may vary based on individual plan coverage.

## 2022-02-21 DIAGNOSIS — M47816 Spondylosis without myelopathy or radiculopathy, lumbar region: Secondary | ICD-10-CM | POA: Diagnosis not present

## 2022-02-21 DIAGNOSIS — M9901 Segmental and somatic dysfunction of cervical region: Secondary | ICD-10-CM | POA: Diagnosis not present

## 2022-02-21 DIAGNOSIS — M47814 Spondylosis without myelopathy or radiculopathy, thoracic region: Secondary | ICD-10-CM | POA: Diagnosis not present

## 2022-02-21 DIAGNOSIS — M47812 Spondylosis without myelopathy or radiculopathy, cervical region: Secondary | ICD-10-CM | POA: Diagnosis not present

## 2022-02-21 DIAGNOSIS — M9902 Segmental and somatic dysfunction of thoracic region: Secondary | ICD-10-CM | POA: Diagnosis not present

## 2022-02-21 DIAGNOSIS — M9903 Segmental and somatic dysfunction of lumbar region: Secondary | ICD-10-CM | POA: Diagnosis not present

## 2022-02-21 DIAGNOSIS — M4728 Other spondylosis with radiculopathy, sacral and sacrococcygeal region: Secondary | ICD-10-CM | POA: Diagnosis not present

## 2022-02-21 DIAGNOSIS — M9904 Segmental and somatic dysfunction of sacral region: Secondary | ICD-10-CM | POA: Diagnosis not present

## 2022-02-28 DIAGNOSIS — M818 Other osteoporosis without current pathological fracture: Secondary | ICD-10-CM | POA: Diagnosis not present

## 2022-02-28 DIAGNOSIS — Z79899 Other long term (current) drug therapy: Secondary | ICD-10-CM | POA: Diagnosis not present

## 2022-02-28 DIAGNOSIS — Z5181 Encounter for therapeutic drug level monitoring: Secondary | ICD-10-CM | POA: Diagnosis not present

## 2022-02-28 DIAGNOSIS — M0609 Rheumatoid arthritis without rheumatoid factor, multiple sites: Secondary | ICD-10-CM | POA: Diagnosis not present

## 2022-03-02 DIAGNOSIS — L821 Other seborrheic keratosis: Secondary | ICD-10-CM | POA: Diagnosis not present

## 2022-03-02 DIAGNOSIS — D225 Melanocytic nevi of trunk: Secondary | ICD-10-CM | POA: Diagnosis not present

## 2022-03-02 DIAGNOSIS — D2272 Melanocytic nevi of left lower limb, including hip: Secondary | ICD-10-CM | POA: Diagnosis not present

## 2022-03-05 ENCOUNTER — Emergency Department (HOSPITAL_BASED_OUTPATIENT_CLINIC_OR_DEPARTMENT_OTHER): Payer: Medicare Other

## 2022-03-05 ENCOUNTER — Other Ambulatory Visit: Payer: Self-pay

## 2022-03-05 ENCOUNTER — Emergency Department (HOSPITAL_BASED_OUTPATIENT_CLINIC_OR_DEPARTMENT_OTHER)
Admission: EM | Admit: 2022-03-05 | Discharge: 2022-03-05 | Disposition: A | Discharge status: Home / Self Care | Payer: Medicare Other | Attending: Emergency Medicine | Admitting: Emergency Medicine

## 2022-03-05 ENCOUNTER — Encounter (HOSPITAL_BASED_OUTPATIENT_CLINIC_OR_DEPARTMENT_OTHER): Payer: Self-pay | Admitting: Emergency Medicine

## 2022-03-05 DIAGNOSIS — S0990XA Unspecified injury of head, initial encounter: Secondary | ICD-10-CM | POA: Diagnosis not present

## 2022-03-05 DIAGNOSIS — M79601 Pain in right arm: Secondary | ICD-10-CM | POA: Insufficient documentation

## 2022-03-05 DIAGNOSIS — Z7901 Long term (current) use of anticoagulants: Secondary | ICD-10-CM | POA: Diagnosis not present

## 2022-03-05 DIAGNOSIS — S59911A Unspecified injury of right forearm, initial encounter: Secondary | ICD-10-CM | POA: Diagnosis present

## 2022-03-05 DIAGNOSIS — S52571A Other intraarticular fracture of lower end of right radius, initial encounter for closed fracture: Secondary | ICD-10-CM | POA: Diagnosis not present

## 2022-03-05 DIAGNOSIS — G319 Degenerative disease of nervous system, unspecified: Secondary | ICD-10-CM | POA: Diagnosis not present

## 2022-03-05 DIAGNOSIS — S52501A Unspecified fracture of the lower end of right radius, initial encounter for closed fracture: Secondary | ICD-10-CM

## 2022-03-05 DIAGNOSIS — M7989 Other specified soft tissue disorders: Secondary | ICD-10-CM | POA: Diagnosis not present

## 2022-03-05 DIAGNOSIS — I6782 Cerebral ischemia: Secondary | ICD-10-CM | POA: Insufficient documentation

## 2022-03-05 DIAGNOSIS — M47816 Spondylosis without myelopathy or radiculopathy, lumbar region: Secondary | ICD-10-CM | POA: Diagnosis not present

## 2022-03-05 DIAGNOSIS — M545 Low back pain, unspecified: Secondary | ICD-10-CM | POA: Diagnosis not present

## 2022-03-05 DIAGNOSIS — W108XXA Fall (on) (from) other stairs and steps, initial encounter: Secondary | ICD-10-CM | POA: Insufficient documentation

## 2022-03-05 DIAGNOSIS — F329 Major depressive disorder, single episode, unspecified: Secondary | ICD-10-CM | POA: Diagnosis not present

## 2022-03-05 DIAGNOSIS — W19XXXA Unspecified fall, initial encounter: Secondary | ICD-10-CM

## 2022-03-05 DIAGNOSIS — M4316 Spondylolisthesis, lumbar region: Secondary | ICD-10-CM | POA: Diagnosis not present

## 2022-03-05 DIAGNOSIS — M8588 Other specified disorders of bone density and structure, other site: Secondary | ICD-10-CM | POA: Diagnosis not present

## 2022-03-05 MED ORDER — HYDROCODONE-ACETAMINOPHEN 5-325 MG PO TABS: 1.0000 | ORAL_TABLET | ORAL | 0 refills | Status: DC | PRN

## 2022-03-05 NOTE — ED Notes (Signed)
Short arm splint applied to Right wrist by ED MD

## 2022-03-05 NOTE — ED Provider Notes (Addendum)
[facility]PART[facility] Provider Note   CSN: [id number] Arrival date & time: 03/05/22  3154     History  Chief Complaint  Patient presents with   Julia [name] is a 71 y.o. female.  Pt is a 71 yo female with a pmhx significant for gastric bypass, afib on Eliquis, pfo (s/p repair), TIA, GERD, multiple lentigines syn, osteoporosis, RA, and depression.  Pt lives alone and slipped on some water this am.  She fell on her right wrist.  She thinks she did hit the back of her head and her lower back.  She is right handed.  No loc.        Home Medications Prior to Admission medications   Medication Sig Start Date End Date Taking? Authorizing Provider  HYDROcodone-acetaminophen (NORCO/VICODIN) 5-325 MG tablet Take 1 tablet by mouth every 4 (four) hours as needed. 03/05/22  Yes Isla Pence, MD  amLODipine (NORVASC) 2.5 MG tablet Take 1 tablet (2.5 mg total) by mouth daily. 05/21/21   [name], MD  apixaban (ELIQUIS) 5 MG TABS tablet Take 1 tablet (5 mg total) by mouth 2 (two) times daily. 08/26/21   Sherran Needs, NP  Ascorbic Acid (VITAMIN C PO) Take 3,200 mg by mouth daily. 1600 mg each    [provider]  Calcium Citrate-Vitamin D (CALCIUM + D PO) Take 1 capsule by mouth 2 (two) times daily.    [provider]  Cholecalciferol (VITAMIN D3) 250 MCG (10000 UT) TABS Take 5,000 Units by mouth 2 (two) times daily.     [provider]  Coenzyme Q10 (COQ-10) 100 MG CAPS Take 200 mg by mouth daily.    [provider]  [name] Oil 1000 MG CAPS Take 1,000 mg by mouth daily.    [provider]  levothyroxine (SYNTHROID) 25 MCG tablet TAKE 1 TABLET(25 MCG) BY MOUTH DAILY 01/17/22   [name], MD  loratadine (CLARITIN) 10 MG tablet Take 1 tablet by mouth daily.    [provider]  Misc Natural Products ([employer] GLUCOS-CHONDROIT-MSM TS) TABS Take 2 tablets by mouth at bedtime.    [provider]  Multiple  Vitamins-Minerals (CENTRUM SILVER PO) Take 1 tablet by mouth daily.     [provider]  Omega-3 Fatty Acids (FISH OIL) 1000 MG CAPS Take 1,000 mg by mouth daily.    [provider]  orlistat (ALLI) 60 MG capsule Take 60 mg by mouth 3 (three) times daily with meals.     [provider]  OVER THE COUNTER MEDICATION Take 1,040 mg by mouth daily. Liposomal otc supplement    [provider]  OZEMPIC, 2 MG/DOSE, 8 MG/3ML SOPN Inject 2 mg into the skin once a week. 01/12/22   [provider]  Probiotic Product (PROBIOTIC PO) Take 1 capsule by mouth daily.    [provider]  pyridOXINE (VITAMIN B-6) 100 MG tablet Take 200 mg by mouth daily.    [provider]  riTUXimab (RITUXAN) 500 MG/50ML injection Inject 10 mg into the vein every 6 (six) months.    [provider]  triamcinolone ointment (KENALOG) 0.5 % Apply 1 Application topically 2 (two) times daily. 02/14/22   [name], MD  UNABLE TO FIND Take 2 capsules by mouth daily. Med Name: Monolaurin 936 mg and Zinc 15 mg    [provider]  valACYclovir (VALTREX) 500 MG tablet TAKE 1 TABLET(500 MG) BY MOUTH DAILY 08/09/21  [name], MD  venlafaxine XR (EFFEXOR-XR) 150 MG 24 hr capsule Take 1 capsule (150 mg total) by mouth daily with breakfast. 03/22/21   [name], MD  zinc gluconate 50 MG tablet Take 50 mg by mouth every Monday, Wednesday, and Friday.    [provider]      Allergies    Methotrexate derivatives    Review of Systems   Review of Systems  Musculoskeletal:        Right wrist pain, low back pain  All other systems reviewed and are negative.   Physical Exam Updated Vital Signs BP 129/86 (BP Location: Left Arm)   Pulse 75   Temp 98 F (36.7 C) (Oral)   Resp 18   Ht '5\' 3"'$  (1.6 m)   Wt 69.9 kg   SpO2 100%   BMI 27.28 kg/m  Physical Exam Vitals and nursing note reviewed.  Constitutional:      Appearance: Normal appearance.   HENT:     Head: Normocephalic and atraumatic.     Right Ear: External ear normal.     Left Ear: External ear normal.     Nose: Nose normal.     Mouth/Throat:     Mouth: Mucous membranes are moist.     Pharynx: Oropharynx is clear.  Eyes:     Extraocular Movements: Extraocular movements intact.     Conjunctiva/sclera: Conjunctivae normal.     Pupils: Pupils are equal, round, and reactive to light.  Cardiovascular:     Rate and Rhythm: Normal rate and regular rhythm.     Pulses: Normal pulses.     Heart sounds: Normal heart sounds.  Pulmonary:     Effort: Pulmonary effort is normal.     Breath sounds: Normal breath sounds.  Abdominal:     General: Abdomen is flat. Bowel sounds are normal.     Palpations: Abdomen is soft.  Musculoskeletal:     Right wrist: Tenderness present. Decreased range of motion.     Cervical back: Normal range of motion and neck supple.  Skin:    General: Skin is warm.     Capillary Refill: Capillary refill takes less than 2 seconds.  Neurological:     General: No focal deficit present.     Mental Status: She is alert and oriented to person, place, and time.  Psychiatric:        Mood and Affect: Mood normal.        Behavior: Behavior normal.     ED Results / Procedures / Treatments   Labs (all labs ordered are listed, but only abnormal results are displayed) Labs Reviewed - No data to display  EKG None  Radiology DG Wrist Complete Right  Result Date: 03/05/2022 CLINICAL DATA:  Fall. EXAM: RIGHT WRIST - COMPLETE 3+ VIEW COMPARISON:  None Available. FINDINGS: Generalized osteopenia. Acute transverse fracture through the distal radius with intra-articular extension seen on the oblique view. No displacement. Located wrist joint. Posterior soft tissue swelling. IMPRESSION: Nondisplaced distal radius fracture. Electronically Signed   By: Jorje Guild M.D.   On: 03/05/2022 09:22   CT Head Wo Contrast  Result Date: 03/05/2022 CLINICAL DATA:   71 year old female with history of trauma from a fall. EXAM: CT HEAD WITHOUT CONTRAST TECHNIQUE: Contiguous axial images were obtained from the base of the skull through the vertex without intravenous contrast. RADIATION DOSE REDUCTION: This exam was performed according to the departmental dose-optimization program which includes automated exposure control, adjustment of the mA  and/or kV according to patient size and/or use of iterative reconstruction technique. COMPARISON:  Head CT 02/14/2020. FINDINGS: Brain: Mild cerebral atrophy. Patchy and confluent areas of decreased attenuation are noted throughout the deep and periventricular white matter of the cerebral hemispheres bilaterally, compatible with chronic microvascular ischemic disease. No evidence of acute infarction, hemorrhage, hydrocephalus, extra-axial collection or mass lesion/mass effect. Vascular: No hyperdense vessel or unexpected calcification. Skull: Normal. Negative for fracture or focal lesion. Sinuses/Orbits: No acute finding. Other: None. IMPRESSION: 1. No evidence of significant acute traumatic injury to the skull or brain. 2. Mild cerebral atrophy with mild chronic microvascular ischemic changes in the cerebral white matter, as above. Electronically Signed   By: Vinnie Langton M.D.   On: 03/05/2022 09:18   DG Lumbar Spine Complete  Result Date: 03/05/2022 CLINICAL DATA:  Fall down stairs today.  Low back pain. EXAM: LUMBAR SPINE - COMPLETE 4+ VIEW COMPARISON:  None Available. FINDINGS: There is no evidence of lumbar spine fracture. Moderate degenerative disc disease is seen at L4-5. Severe facet DJD is seen bilaterally at L4-5 and L5-S1. Grade 1 anterolisthesis is seen at L4-5 measuring 8 mm, which is attributable to degenerative changes seen at this level. No focal lytic or sclerotic bone lesions identified. Generalized osteopenia noted. IMPRESSION: No acute findings. Degenerative spondylosis at L4-5 and L5-S1, with grade 1  anterolisthesis at L4-5. Osteopenia. Electronically Signed   By: Marlaine Hind M.D.   On: 03/05/2022 09:13    Procedures .Ortho Injury Treatment  Date/Time: 03/05/2022 10:05 AM  Performed by: Isla Pence, MD Authorized by: Isla Pence, MD   Consent:    Consent obtained:  VerbalInjury location: wrist Location details: right wrist Injury type: fracture Fracture type: distal radius Pre-procedure neurovascular assessment: neurovascularly intact Pre-procedure distal perfusion: normal Pre-procedure neurological function: normal Pre-procedure range of motion: normal Manipulation performed: no Immobilization: splint Splint type: volar short arm Splint Applied by: ED Provider Supplies used: cotton padding, elastic bandage and Ortho-Glass Post-procedure neurovascular assessment: post-procedure neurovascularly intact Post-procedure distal perfusion: normal Post-procedure neurological function: normal Post-procedure range of motion: normal       Medications Ordered in ED Medications - No data to display  ED Course/ Medical Decision Making/ A&P                           Medical Decision Making Amount and/or Complexity of Data Reviewed Radiology: ordered.  Risk Prescription drug management.   This patient presents to the ED for concern of fall, this involves an extensive number of treatment options, and is a complaint that carries with it a high risk of complications and morbidity.  The differential diagnosis includes multiple trauma   Co morbidities that complicate the patient evaluation   gastric bypass, afib on Eliquis, pfo (s/p repair), TIA, GERD, multiple lentigines syn, osteoporosis, RA, and depression   Additional history obtained:  Additional history obtained from epic chart review   Imaging Studies ordered:  I ordered imaging studies including ct head, lumbar spine, right wrist  I independently visualized and interpreted imaging which showed  Wrist  x-ray IMPRESSION:  Nondisplaced distal radius fracture.  Lumbar x-ray: IMPRESSION:  No acute findings.    Degenerative spondylosis at L4-5 and L5-S1, with grade 1  anterolisthesis at L4-5.    Osteopenia.  CT head: IMPRESSION:  1. No evidence of significant acute traumatic injury to the skull or  brain.  2. Mild cerebral atrophy with mild chronic microvascular ischemic  changes in  the cerebral white matter, as above.   I agree with the radiologist interpretation  Medicines ordered and prescription drug management:  I have reviewed the patients home medicines and have made adjustments as needed   Test Considered:  Ct head   Critical Interventions:  splint   Problem List / ED Course:  Nondisplaced, distal radius fx on right.  Pt placed in a volar splint.  She did not want anything for pain here, but wants some for home.  She is to f/u with hand.  Return if worse.    Reevaluation:  After the interventions noted above, I reevaluated the patient and found that they have :improved   Social Determinants of Health:  Lives alone   Dispostion:  After consideration of the diagnostic results and the patients response to treatment, I feel that the patent would benefit from discharge with outpatient f/u.          Final Clinical Impression(s) / ED Diagnoses Final diagnoses:  Fall, initial encounter  Closed fracture of distal end of right radius, unspecified fracture morphology, initial encounter    Rx / DC Orders ED Discharge Orders          Ordered    HYDROcodone-acetaminophen (NORCO/VICODIN) 5-325 MG tablet  Every 4 hours PRN        03/05/22 0934              Isla Pence, MD 03/05/22 1517    Isla Pence, MD 03/05/22 1006

## 2022-03-05 NOTE — ED Triage Notes (Signed)
States fell on the stairs around 0600, slipped on a wet area. No LOC, pain to right wrist and lower back pain

## 2022-03-08 DIAGNOSIS — S52501A Unspecified fracture of the lower end of right radius, initial encounter for closed fracture: Secondary | ICD-10-CM | POA: Diagnosis not present

## 2022-03-11 ENCOUNTER — Other Ambulatory Visit: Payer: Self-pay | Admitting: Physician Assistant

## 2022-03-11 DIAGNOSIS — Q2112 Patent foramen ovale: Secondary | ICD-10-CM

## 2022-03-11 DIAGNOSIS — T8544XA Capsular contracture of breast implant, initial encounter: Secondary | ICD-10-CM | POA: Diagnosis not present

## 2022-03-11 DIAGNOSIS — N6481 Ptosis of breast: Secondary | ICD-10-CM | POA: Diagnosis not present

## 2022-03-11 DIAGNOSIS — Z888 Allergy status to other drugs, medicaments and biological substances status: Secondary | ICD-10-CM | POA: Diagnosis not present

## 2022-03-11 DIAGNOSIS — Z9882 Breast implant status: Secondary | ICD-10-CM | POA: Diagnosis not present

## 2022-03-11 DIAGNOSIS — T8549XA Other mechanical complication of breast prosthesis and implant, initial encounter: Secondary | ICD-10-CM | POA: Diagnosis not present

## 2022-03-11 DIAGNOSIS — Z8774 Personal history of (corrected) congenital malformations of heart and circulatory system: Secondary | ICD-10-CM

## 2022-03-11 DIAGNOSIS — I480 Paroxysmal atrial fibrillation: Secondary | ICD-10-CM

## 2022-03-11 DIAGNOSIS — I10 Essential (primary) hypertension: Secondary | ICD-10-CM

## 2022-03-11 DIAGNOSIS — Z8673 Personal history of transient ischemic attack (TIA), and cerebral infarction without residual deficits: Secondary | ICD-10-CM

## 2022-03-11 HISTORY — PX: OTHER SURGICAL HISTORY: SHX169

## 2022-03-14 ENCOUNTER — Other Ambulatory Visit: Payer: Self-pay | Admitting: Internal Medicine

## 2022-03-18 ENCOUNTER — Ambulatory Visit: Payer: Medicare Other | Admitting: Cardiovascular Disease

## 2022-03-22 DIAGNOSIS — S52501D Unspecified fracture of the lower end of right radius, subsequent encounter for closed fracture with routine healing: Secondary | ICD-10-CM | POA: Diagnosis not present

## 2022-03-30 ENCOUNTER — Ambulatory Visit: Payer: Medicare Other | Admitting: Cardiovascular Disease

## 2022-03-31 NOTE — Telephone Encounter (Signed)
Patient is scheduled   

## 2022-04-11 ENCOUNTER — Ambulatory Visit (INDEPENDENT_AMBULATORY_CARE_PROVIDER_SITE_OTHER): Payer: Medicare Other

## 2022-04-11 DIAGNOSIS — E559 Vitamin D deficiency, unspecified: Secondary | ICD-10-CM

## 2022-04-11 MED ORDER — DENOSUMAB 60 MG/ML ~~LOC~~ SOSY
60.0000 mg | PREFILLED_SYRINGE | Freq: Once | SUBCUTANEOUS | Status: AC
  Administered 2022-04-11: 60 mg via SUBCUTANEOUS

## 2022-04-11 NOTE — Progress Notes (Signed)
Pt received injection and tolerated it well.

## 2022-04-13 DIAGNOSIS — Z23 Encounter for immunization: Secondary | ICD-10-CM | POA: Diagnosis not present

## 2022-04-20 DIAGNOSIS — S52551D Other extraarticular fracture of lower end of right radius, subsequent encounter for closed fracture with routine healing: Secondary | ICD-10-CM | POA: Diagnosis not present

## 2022-04-26 ENCOUNTER — Telehealth: Payer: Self-pay

## 2022-04-26 ENCOUNTER — Other Ambulatory Visit: Payer: Self-pay | Admitting: Internal Medicine

## 2022-04-26 NOTE — Telephone Encounter (Signed)
Patient wanted to inform Dr.Burns that she received her flu shot and most recent covid vaccine on 04/13/22.

## 2022-04-27 ENCOUNTER — Ambulatory Visit: Payer: Medicare Other | Admitting: Internal Medicine

## 2022-04-27 NOTE — Telephone Encounter (Signed)
Info updated

## 2022-05-01 NOTE — Progress Notes (Unsigned)
Subjective:    Patient ID: Julia [name], female    DOB: [birth year], 71 y.o.   MRN: [id number]      HPI Julia [name] is here for No chief complaint on file.    Disorientation when walking up -     Medications and allergies reviewed with patient and updated if appropriate.  Current Outpatient Medications on File Prior to Visit  Medication Sig Dispense Refill   amLODipine (NORVASC) 2.5 MG tablet TAKE 1 TABLET(2.5 MG) BY MOUTH DAILY 90 tablet 3   apixaban (ELIQUIS) 5 MG TABS tablet Take 1 tablet (5 mg total) by mouth 2 (two) times daily. 180 tablet 2   Ascorbic Acid (VITAMIN C PO) Take 3,200 mg by mouth daily. 1600 mg each     Calcium Citrate-Vitamin D (CALCIUM + D PO) Take 1 capsule by mouth 2 (two) times daily.     Cholecalciferol (VITAMIN D3) 250 MCG (10000 UT) TABS Take 5,000 Units by mouth 2 (two) times daily.      Coenzyme Q10 (COQ-10) 100 MG CAPS Take 200 mg by mouth daily.     HYDROcodone-acetaminophen (NORCO/VICODIN) 5-325 MG tablet Take 1 tablet by mouth every 4 (four) hours as needed. 15 tablet 0   Krill Oil 1000 MG CAPS Take 1,000 mg by mouth daily.     levothyroxine (SYNTHROID) 25 MCG tablet TAKE 1 TABLET(25 MCG) BY MOUTH DAILY 90 tablet 3   loratadine (CLARITIN) 10 MG tablet Take 1 tablet by mouth daily.     Misc Natural Products ([employer] GLUCOS-CHONDROIT-MSM TS) TABS Take 2 tablets by mouth at bedtime.     Multiple Vitamins-Minerals (CENTRUM SILVER PO) Take 1 tablet by mouth daily.      Omega-3 Fatty Acids (FISH OIL) 1000 MG CAPS Take 1,000 mg by mouth daily.     orlistat (ALLI) 60 MG capsule Take 60 mg by mouth 3 (three) times daily with meals.      OVER THE COUNTER MEDICATION Take 1,040 mg by mouth daily. Liposomal otc supplement     OZEMPIC, 2 MG/DOSE, 8 MG/3ML SOPN Inject 2 mg into the skin once a week.     Probiotic Product (PROBIOTIC PO) Take 1 capsule by mouth daily.     pyridOXINE (VITAMIN B-6) 100 MG tablet Take 200 mg by mouth daily.     riTUXimab (RITUXAN) 500  MG/50ML injection Inject 10 mg into the vein every 6 (six) months.     triamcinolone ointment (KENALOG) 0.5 % Apply 1 Application topically 2 (two) times daily. 30 g 0   UNABLE TO FIND Take 2 capsules by mouth daily. Med Name: Monolaurin 936 mg and Zinc 15 mg     valACYclovir (VALTREX) 500 MG tablet TAKE 1 TABLET(500 MG) BY MOUTH DAILY 90 tablet 1   venlafaxine XR (EFFEXOR-XR) 150 MG 24 hr capsule Take 1 capsule (150 mg total) by mouth daily with breakfast. 90 capsule 3   zinc gluconate 50 MG tablet Take 50 mg by mouth every Monday, Wednesday, and Friday.     Current Facility-Administered Medications on File Prior to Visit  Medication Dose Route Frequency Provider Last Rate Last Admin   sodium chloride flush (NS) 0.9 % injection 10 mL  10 mL Intravenous PRN Angelena Form R, PA-C   20 mL at 07/16/21 1025    Review of Systems     Objective:  There were no vitals filed for this visit. BP Readings from Last 3 Encounters:  03/05/22 129/86  02/15/22 112/78  02/14/22 128/80  Wt Readings from Last 3 Encounters:  03/05/22 154 lb (69.9 kg)  02/15/22 160 lb (72.6 kg)  02/14/22 160 lb (72.6 kg)   There is no height or weight on file to calculate BMI.    Physical Exam         Assessment & Plan:    See Problem List for Assessment and Plan of chronic medical problems.

## 2022-05-02 ENCOUNTER — Ambulatory Visit (INDEPENDENT_AMBULATORY_CARE_PROVIDER_SITE_OTHER): Payer: Medicare Other | Admitting: Internal Medicine

## 2022-05-02 ENCOUNTER — Encounter: Payer: Self-pay | Admitting: Internal Medicine

## 2022-05-02 VITALS — BP 128/74 | HR 82 | Temp 98.2°F | Ht 63.0 in | Wt 154.0 lb

## 2022-05-02 DIAGNOSIS — R410 Disorientation, unspecified: Secondary | ICD-10-CM | POA: Diagnosis not present

## 2022-05-02 DIAGNOSIS — I10 Essential (primary) hypertension: Secondary | ICD-10-CM

## 2022-05-02 NOTE — Patient Instructions (Signed)
     Medications changes include :  none     A referral was ordered for Dr [name].     Someone will call you to schedule an appointment.

## 2022-05-02 NOTE — Assessment & Plan Note (Signed)
2 episodes over the past year waking up feeling disorientated/confused-not knowing what day it was or what she was supposed to do that day.  Also mood was monotone/flat She did have a friend with her one of the episodes there is no neurological deficit Symptoms slowly improved throughout the day and then the next day she was back to normal She has been taking Eliquis daily as prescribed No new medications, supplements or anything different about the night prior to these episodes Had CT of the head after she fell which was a few months after the first episode, which was unchanged so we will hold off on any imaging ?  TIA, seizure Will refer to neurology for further evaluation

## 2022-05-02 NOTE — Assessment & Plan Note (Signed)
Chronic Blood pressure well-controlled Continue amlodipine 2.5 mg daily 

## 2022-05-08 NOTE — Telephone Encounter (Signed)
Last Prolia inj 04/11/22 Next Prolia inj due 10/12/22

## 2022-05-09 ENCOUNTER — Encounter: Payer: Self-pay | Admitting: Internal Medicine

## 2022-05-17 ENCOUNTER — Other Ambulatory Visit: Payer: Self-pay | Admitting: Internal Medicine

## 2022-05-17 DIAGNOSIS — S52551D Other extraarticular fracture of lower end of right radius, subsequent encounter for closed fracture with routine healing: Secondary | ICD-10-CM | POA: Diagnosis not present

## 2022-05-19 DIAGNOSIS — Z5181 Encounter for therapeutic drug level monitoring: Secondary | ICD-10-CM | POA: Diagnosis not present

## 2022-05-19 DIAGNOSIS — Z79899 Other long term (current) drug therapy: Secondary | ICD-10-CM | POA: Diagnosis not present

## 2022-05-19 DIAGNOSIS — M0609 Rheumatoid arthritis without rheumatoid factor, multiple sites: Secondary | ICD-10-CM | POA: Diagnosis not present

## 2022-06-05 ENCOUNTER — Encounter: Payer: Self-pay | Admitting: Neurology

## 2022-06-06 ENCOUNTER — Other Ambulatory Visit: Payer: Self-pay | Admitting: Neurology

## 2022-06-06 ENCOUNTER — Encounter: Payer: Self-pay | Admitting: Internal Medicine

## 2022-06-06 ENCOUNTER — Telehealth: Payer: Self-pay | Admitting: Neurology

## 2022-06-06 ENCOUNTER — Institutional Professional Consult (permissible substitution): Payer: Medicare Other | Admitting: Neurology

## 2022-06-06 ENCOUNTER — Telehealth (INDEPENDENT_AMBULATORY_CARE_PROVIDER_SITE_OTHER): Payer: Medicare Other | Admitting: Internal Medicine

## 2022-06-06 DIAGNOSIS — G4734 Idiopathic sleep related nonobstructive alveolar hypoventilation: Secondary | ICD-10-CM

## 2022-06-06 DIAGNOSIS — R410 Disorientation, unspecified: Secondary | ICD-10-CM

## 2022-06-06 DIAGNOSIS — J019 Acute sinusitis, unspecified: Secondary | ICD-10-CM | POA: Diagnosis not present

## 2022-06-06 DIAGNOSIS — R4189 Other symptoms and signs involving cognitive functions and awareness: Secondary | ICD-10-CM

## 2022-06-06 DIAGNOSIS — G459 Transient cerebral ischemic attack, unspecified: Secondary | ICD-10-CM

## 2022-06-06 MED ORDER — AMOXICILLIN-POT CLAVULANATE 875-125 MG PO TABS: 1.0000 | ORAL_TABLET | Freq: Two times a day (BID) | ORAL | 0 refills | Status: DC

## 2022-06-06 NOTE — Telephone Encounter (Addendum)
ONO order sent to [facility]. Julia [name] w/ [facility] confirmed receipt of order. There are options for mailing equipment vs pickup that the scheduling team will go over with the patient.

## 2022-06-06 NOTE — Telephone Encounter (Signed)
Spoke to patient rescheduled appointment for 06/17/2022 . Pt not feeling well. Pt thanked me for calling

## 2022-06-06 NOTE — Progress Notes (Deleted)
[facility]    Provider:  Dr [name] Requesting Provider: [name], MD Primary Care Provider:  [name], MD    CC:  TIA f/u, new complaint "confusion"   HPI: Follow up with Dr. [name] 06/06/2022: Patient is here for follow-up, past medical history TIAs, migraines, gastric bypass, A-fib on Eliquis, PFO status postrepair, TIA, GERD, multiple lentiginous syndrome, osteoporosis, RA and depression.  Last time she was seen was in February 2023, she was seen regarding TIA via MyChart message.  She saw [name], NP, at that time she was stable from a stroke standpoint without new or recurring stroke TIA symptoms, she was compliant on Eliquis, following with cardiology with repeat echo which showed no residual shunting from prior PFO closure.  At that time she had no concerns.  In the past she had extensive workup, for likely multiple TIA events with 30-day cardiac monitor finding A-fib and 2D echo showing evidence of PFO which was confirmed on TEE by Dr. [name].  She did have PFO closure and as above is on Eliquis.  We initially saw her for TIA versus complicated migraine several years ago and workup as above found a PFO and atrial fibrillation.  MRI at the time was negative.  Today here for an episode of confusion.  I reviewed Dr. [name] notes, last seen 05/03/2019 2:23 episodes of disorientation.  Most recent was 2 weeks prior.  She had 2 episodes of waking up feeling disoriented or confused.  The first episode was at the beginning of the year, she woke up did not have any recollection of what day it was, she asked what her ex-husband was, she is unsure how long the symptoms lasted but she thinks they slowly got better throughout the day.  The next day she woke up normal.  The last episode that happened 2 weeks prior had a friend staying with her she woke up in the morning and did not know what day it was or what she was supposed to do that day, nothing out of the  ordinary or inciting events.  She is cut back on alcohol.  Significant amount of stress over the past couple of years.  From a review of notes, she was also seen in the emergency room October 26 for a fall which seem mechanical she slipped on some water fell on her right wrist.  At that time in August she was given hydrocodone.  I review of her meds also show she was on naltrexone in March 2023 and in August 2023.  She was also on hydrocodone August 2026 2023.   Reviewed images: CT 03/05/2022: Other: None.   IMPRESSION: 1. No evidence of significant acute traumatic injury to the skull or brain. 2. Mild cerebral atrophy with mild chronic microvascular ischemic changes in the cerebral white matter, as above.    Update 08/24/2021 JM: Returns for 1 year follow-up regarding TIA via MyChart video visit.  Overall stable from stroke standpoint without new or reoccurring stroke/TIA symptoms.  Compliant on Eliquis and omega-3 without side effects.  Blood pressure not monitored at home but stable at appointments. Recently had f/u with cardiology with repeat echo which showed no residual shunting from prior PFO closure. Scheduled with Dr. Quay Burow for annual exam and repeat labs next month. No new concerns at this time.     History provided for reference purposes only Update 08/25/2020 JM: Ms. Adcox returns for 45-monthfollow-up regarding likely TIA.  Since prior visit, she completed  further evaluation for likely multiple TIA events with 30-day cardiac event monitor finding atrial fibrillation and 2D echo showing evidence of PFO which was confirmed on TEE by Dr. [name].  She had appropriate follow-up with cardiology and has been started on Eliquis 5 mg twice daily as well as underwent successful PFO closure by Dr. [name] on 06/19/2020 with use of aspirin for 3 months.  She denies any additional stroke/TIA symptoms Reports compliance with Eliquis 5 mg twice daily and aspirin '81mg'$  daily tolerating without side  effects Currently on omega-3 fatty acid for cholesterol management with prior LDL 83 (08/2019) -she plans on scheduling follow-up visit with PCP in the near future for repeat lipid panel Blood pressure today 128/74   No concerns at this time  Initial consult visit 04/22/2020 Dr. [name] (copy for reference purposes only)  Penny Barsamian is a 71 y.o. female here as requested by [name], MD for TIA versus BPPV vs complicated migraine(patient denies having a headache that day and NO history of migraines).  Patient was seen in the emergency room February 14, 2020, presenting for headache, photophobia, lightheadedness, "everything blinking", nausea vomiting, change speech pattern, code stroke was not called, she reported becoming acutely dizzy with nausea and vomiting that morning, also vision changes which she described as flashing blinking lights, she also reported a speech abnormality, code stroke was activated, initial CT and labs were negative, MRI was ordered with no acute abnormality, diagnosis was complex migraine versus peripheral vertigo.  She reported acute onset, there were no focal deficits in strength in the arms and legs, neurology was consulted, imaging was unremarkable, patient symptoms improved she was able to ambulate and she was discharged home.   Patient is here and states that she had a debilitating episode, lasted for many hours, she went to the emergency room, included dizziness, vomiting, slurred abnormal speech, double vision, also 2 other episodes of bouts of amnesia 24 hours each time. She does not have a history of migraines. This was something new. She could not open her eyes due to vomiting, every time she opened her eyes she vomited, Slowly became worse, she was terrified, she thought she was dieing, they called emergency, she doesn't have much recollection of being in the ambulance. No headache. Started with dizziness and had chest heaviness, the dizziness and vertigo were non  stop, not associated with movement, she denies any drug use or alcohol use. She has never had a migraine, she has no significant history of headaches or migraines. She denies any inciting event, no illnesses. She has also had Transient Global Amnesia twice.   Reviewed notes, labs and imaging from outside physicians, which showed:  I reviewed MRI of the brain images February 14, 2020: Which appeared normal.  I reviewed CTA of the head and neck reports February 14, 2020: Which were also normal and negative for large vessel occlusion or significant atherosclerotic disease.  February 14, 2020: Rapid drug screen showed benzodiazepines and THC.  CMP showed elevated glucose otherwise normal.  Review of Systems: Patient complains of symptoms per HPI. Pertinent negatives and positives per HPI. All others negative.   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[employer]  Tobacco Use   Smoking status: Never  Smokeless tobacco: Never  Vaping Use   Vaping Use: Never used  Substance and Sexual Activity   Alcohol use: Yes    Alcohol/week: 3.0 - 5.0 standard drinks of alcohol    Types: 3 - 5 Glasses of wine per week    Comment: per week   Drug use: Never   Sexual activity: Not on file  Other Topics Concern   Not on file  Social History Narrative   Retired 07/10/17 from [employer]   No regular exercise      Lives at home with husband   Caffeine: 2 glasses/day diet dr pepper   Social Determinants of Health   Financial Resource Strain: Low Risk  (02/14/2022)   Overall Financial Resource Strain (CARDIA)    Difficulty of Paying Living Expenses: Not hard at all  Food Insecurity: No Food Insecurity (02/14/2022)   Hunger Vital Sign    Worried About Running Out of Food in the Last Year: Never true     Ran Out of Food in the Last Year: Never true  Transportation Needs: No Transportation Needs (02/14/2022)   PRAPARE - Hydrologist (Medical): No    Lack of Transportation (Non-Medical): No  Physical Activity: Insufficiently Active (02/14/2022)   Exercise Vital Sign    Days of Exercise per Week: 3 days    Minutes of Exercise per Session: 30 min  Stress: Stress Concern Present (02/14/2022)   [place]    Feeling of Stress : To some extent  Social Connections: Socially Isolated (02/14/2022)   Social Connection and Isolation Panel [NHANES]    Frequency of Communication with Friends and Family: More than three times a week    Frequency of Social Gatherings with Friends and Family: Twice a week    Attends Religious Services: Never    Marine scientist or Organizations: No    Attends Archivist Meetings: Never    Marital Status: Divorced  Human resources officer Violence: Not At Risk (02/14/2022)   Humiliation, Afraid, Rape, and Kick questionnaire    Fear of Current or Ex-Partner: No    Emotionally Abused: No    Physically Abused: No    Sexually Abused: No    Family History  Problem Relation Age of Onset   Hypertension Mother    Heart disease Mother    Heart failure Mother    Hypertension Father    Ulcers Father    Gallstones Father    Kidney Stones Father    Hypertension Sister    Hypertension Brother    Heart attack Maternal Grandmother    Colon cancer Neg Hx    Stomach cancer Neg Hx    Esophageal cancer Neg Hx     Past Medical History:  Diagnosis Date   Adjustment disorder with mixed anxiety and depressed mood    Arthritis    Depression    GERD (gastroesophageal reflux disease) 01/13/2017   Hemangioma 04/18/2017   History of breast augmentation    History of colon polyps 01/14/2017   Last colonoscopy 2018 - no polyps - advised 5 years   History of gastric bypass    History  of pelvis fracture    in 30's   History of tuberculosis exposure 01/14/2017   Hx of latent TB, treated for 9 months   Hyperglycemia 09/28/2020   Hypertension    Hypothyroidism 01/14/2017   Melanocytic nevi, unspecified 04/18/2017   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   Obesity  Osteoporosis 01/14/2017   07/2018 - spine  -2.3,  RFN  -1.9,  LFN  -2.3   - improvement in hips dexa 07/25/17 - spine  -3.1,   RFN  -2.2,  LFN  -2.4  - started on prolia DEXA 05/13/2010 in [place], osteopenia   Spine: -2.3   LFN  -1.7   RFN:  -1.8     FRAX  15%, 1.9%    Paroxysmal atrial fibrillation 09/29/2020   PFO with atrial septal aneurysm 06/19/2020   Plant dermatitis 01/08/2020   Pneumonia    Purpura 04/18/2017   Rheumatoid arthritis 01/13/2017   Following at [facility] 2 rheumatology  Has been on Prednisone, Plaquenil, Arava, Humira, etolodac, Enbrel, Orencia, Remicade and Rituxan     Sebaceous cyst 04/18/2017   TIA (transient ischemic attack) 04/22/2020   Ventral hernia without obstruction or gangrene 11/01/2019   Noted on Ct scan 10/2019   Vitamin D deficiency 06/22/2017    Patient Active Problem List   Diagnosis Date Noted   Confusion 05/02/2022   Preop examination 02/14/2022   Aortic atherosclerosis ([place]) 01/20/2022   Right lower quadrant abdominal pain 12/22/2021   Constipation 12/22/2021   Blood in stool 12/22/2021   Alcohol abuse 09/20/2021   Paroxysmal atrial fibrillation 09/29/2020   Hyperglycemia 09/28/2020   PFO with atrial septal aneurysm s/p closure 06/19/2020   TIA (transient ischemic attack) 04/22/2020   Adjustment disorder with mixed anxiety and depressed mood    Ventral hernia without obstruction or gangrene 11/01/2019   Hypertension 08/30/2019   Vitamin D deficiency 06/22/2017   Hemangioma 04/18/2017   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   Purpura 04/18/2017   Hypothyroidism 01/14/2017   History of tuberculosis exposure 01/14/2017   Osteoporosis - [facility], Dr Remer Macho  01/14/2017   History of gastric bypass 01/14/2017   History of colon polyps 01/14/2017   Rheumatoid arthritis - [facility], Dr Remer Macho 01/13/2017   GERD (gastroesophageal reflux disease) 01/13/2017    Past Surgical History:  Procedure Laterality Date   APPENDECTOMY     BREAST ENHANCEMENT SURGERY     BUBBLE STUDY  05/27/2020   Procedure: BUBBLE STUDY;  Surgeon: [name], MD;  Location: [place];  Service: Cardiovascular;;   GASTRIC BYPASS     PATENT FORAMEN OVALE(PFO) CLOSURE N/A 06/19/2020   Procedure: PATENT FORAMEN OVALE (PFO) CLOSURE;  Surgeon: [name], MD;  Location: [place] [facility];  Service: Cardiovascular;  Laterality: N/A;   TEE WITHOUT CARDIOVERSION N/A 05/27/2020   Procedure: TRANSESOPHAGEAL ECHOCARDIOGRAM (TEE);  Surgeon: [name], MD;  Location: [place];  Service: Cardiovascular;  Laterality: N/A;   TONSILLECTOMY      Current Outpatient Medications  Medication Sig Dispense Refill   amLODipine (NORVASC) 2.5 MG tablet TAKE 1 TABLET(2.5 MG) BY MOUTH DAILY 90 tablet 3   apixaban (ELIQUIS) 5 MG TABS tablet Take 1 tablet (5 mg total) by mouth 2 (two) times daily. 180 tablet 2   Ascorbic Acid (VITAMIN C PO) Take 3,200 mg by mouth daily. 1600 mg each     Calcium Citrate-Vitamin D (CALCIUM + D PO) Take 1 capsule by mouth 2 (two) times daily.     Cholecalciferol (VITAMIN D3) 250 MCG (10000 UT) TABS Take 5,000 Units by mouth 2 (two) times daily.      Coenzyme Q10 (COQ-10) 100 MG CAPS Take 200 mg by mouth daily.     Krill Oil 1000 MG CAPS Take 1,000 mg by mouth daily.     levothyroxine (SYNTHROID) 25 MCG tablet  TAKE 1 TABLET(25 MCG) BY MOUTH DAILY 90 tablet 3   Misc Natural Products ([employer] GLUCOS-CHONDROIT-MSM TS) TABS Take 2 tablets by mouth at bedtime.     Multiple Vitamins-Minerals (CENTRUM SILVER PO) Take 1 tablet by mouth daily.      Omega-3 Fatty Acids (FISH OIL) 1000 MG CAPS Take 1,000 mg by mouth daily.     orlistat (ALLI) 60 MG capsule  Take 60 mg by mouth 3 (three) times daily with meals.      OVER THE COUNTER MEDICATION Take 1,040 mg by mouth daily. Liposomal otc supplement     OZEMPIC, 2 MG/DOSE, 8 MG/3ML SOPN Inject 2 mg into the skin once a week.     Probiotic Product (PROBIOTIC PO) Take 1 capsule by mouth daily.     pyridOXINE (VITAMIN B-6) 100 MG tablet Take 200 mg by mouth daily.     riTUXimab (RITUXAN) 500 MG/50ML injection Inject 10 mg into the vein every 6 (six) months.     triamcinolone ointment (KENALOG) 0.5 % Apply 1 Application topically 2 (two) times daily. 30 g 0   UNABLE TO FIND Take 2 capsules by mouth daily. Med Name: Monolaurin 936 mg and Zinc 15 mg     valACYclovir (VALTREX) 500 MG tablet TAKE 1 TABLET(500 MG) BY MOUTH DAILY 90 tablet 1   venlafaxine XR (EFFEXOR-XR) 150 MG 24 hr capsule TAKE 1 CAPSULE(150 MG) BY MOUTH DAILY WITH BREAKFAST 90 capsule 3   zinc gluconate 50 MG tablet Take 50 mg by mouth every Monday, Wednesday, and Friday.     No current facility-administered medications for this visit.   Facility-Administered Medications Ordered in Other Visits  Medication Dose Route Frequency Provider Last Rate Last Admin   sodium chloride flush (NS) 0.9 % injection 10 mL  10 mL Intravenous PRN Angelena Form R, PA-C   20 mL at 07/16/21 1025    Allergies as of 06/06/2022 - Review Complete 05/02/2022  Allergen Reaction Noted   Methotrexate derivatives Itching 04/22/2020    Vitals: There were no vitals taken for this visit. Last Weight:  Wt Readings from Last 1 Encounters:  05/02/22 154 lb (69.9 kg)   Last Height:   Ht Readings from Last 1 Encounters:  05/02/22 '5\' 3"'$  (1.6 m)     Physical exam: General: well developed, well nourished, very pleasant elderly Caucasian female, seated, in no evident distress Head: head normocephalic and atraumatic.    Neurologic Exam Mental Status: Awake and fully alert.  Fluent speech and language.  Oriented to place and time. Recent and remote memory  intact. Attention span, concentration and fund of knowledge appropriate. Mood and affect appropriate.  Cranial Nerves: Extraocular movements full without nystagmus. Hearing intact to voice. Facial sensation intact. Face, tongue, palate moves normally and symmetrically.  Shoulder shrug symmetric. Motor: No evidence of weakness per drift assessment Coordination: Rapid alternating movements normal in all extremities. Finger-to-nose and heel-to-shin performed accurately bilaterally.      Assessment/Plan:  Patient presented to the ED on 02/14/2020 with severe nausea, vomiting, non-positional vertigo, acute severe onset, dysarthria concerning for posterior circulation TIA. She has has additionally 2 episodes of Transient Global amnesia which is very unusual.  She denied having a headache during the episode and she has never had migraines or significant headaches so I don't think this was a "complicated" migraine, and doesn't sound liker BPPV.  Further stroke work-up revealed evidence of atrial fibrillation and PFO     TIA No additional TIA/stroke symptoms Continue Eliquis 5 mg  twice daily and omega-3 for secondary stroke prevention Discussed secondary stroke prevention measures and importance of close PCP follow-up for aggressive stroke risk factor management including HTN with BP goal<130/90 and HLD with LDL goal<70   A. Fib:  Found on cardiac monitor 05/2020.   CHA2DS2-VASc score of 5 therefore cardiology Eliquis 5 mg twice daily which they continue to manage    PFO S/p closure 08/21/2019 by Dr. [name] Continue aspirin 81 mg daily until 3/10 per cards (3 month post closure)      Doing well from stroke standpoint and risk factors are managed by PCP. She may follow up PRN, as usual for our patients who are strictly being followed for stroke. If any new neurological issues should arise, request PCP place referral for evaluation by one of our neurologists. Thank you.      CC:  [name], MD   I spent 26 minutes of face-to-face and non-face-to-face time with patient.  This included previsit chart review, lab review, study review, order entry, electronic health record documentation, patient education and discussion regarding prior episodes likely TIA, A. fib and PFO s/p closure, routine follow-up with PCP for aggressive stroke risk factor management and answered all other questions to patient satisfaction   [name], AGNP-BC  [facility] [address] [place], [place] [address]  Phone [phone] Fax [phone] Note: This document was prepared with digital dictation and possible smart phrase technology. Any transcriptional errors that result from this process are unintentional.

## 2022-06-06 NOTE — Progress Notes (Signed)
Virtual Visit via Video Note  I connected with Julia [name] on 06/06/22 at 10:20 AM EST by a video enabled telemedicine application and verified that I am speaking with the correct person using two identifiers.   I discussed the limitations of evaluation and management by telemedicine and the availability of in person appointments. The patient expressed understanding and agreed to proceed.  Present for the visit:  Myself, Dr [name], Julia [name].  The patient is currently at home and I am in the office.    No referring provider.    History of Present Illness: She is here for an acute visit for cough and congestion..   She had a head cold last week.  She thought it was just a head cold.  She has tested neg for covid x 2.  Symptoms worse the end of last week.  Symptoms include fatigue, significant congestion, sinus pressure, sore throat that has resolved, runny nose, hoarseness, postnasal drip, hoarseness, cough, nausea and some mild achiness.  She has not had any fevers that she is aware of, shortness of breath or wheezing.  She is taking coricidin every 4 hrs, halls, otc cold medications, vitamins.    Review of Systems  Constitutional:  Positive for malaise/fatigue. Negative for fever.  HENT:  Positive for congestion, sinus pain and sore throat (resolved). Negative for ear pain.        Runny nose, PND, hoarseness  Respiratory:  Positive for cough. Negative for shortness of breath and wheezing.   Gastrointestinal:  Positive for nausea. Negative for diarrhea.  Musculoskeletal:  Negative for myalgias.       She is achy  Neurological:  Negative for dizziness and headaches.      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[employer]  Tobacco Use   Smoking status: Never    Smokeless tobacco: Never  Vaping Use   Vaping Use: Never used  Substance and Sexual Activity   Alcohol use: Yes    Alcohol/week: 3.0 - 5.0 standard drinks of alcohol    Types: 3 - 5 Glasses of wine per week    Comment: per week   Drug use: Never   Sexual activity: Not on file  Other Topics Concern   Not on file  Social History Narrative   Retired 07/10/17 from [employer]   No regular exercise      Lives at home with husband   Caffeine: 2 glasses/day diet dr pepper   Social Determinants of Health   Financial Resource Strain: Low Risk  (02/14/2022)   Overall Financial Resource Strain (CARDIA)    Difficulty of Paying Living Expenses: Not hard at all  Food Insecurity: No Food Insecurity (02/14/2022)   Hunger Vital Sign    Worried About Running Out of Food in the Last Year: Never true    Ran Out of Food in the Last Year: Never true  Transportation Needs: No Transportation Needs (02/14/2022)   PRAPARE - Hydrologist (Medical): No    Lack of Transportation (Non-Medical): No  Physical Activity: Insufficiently Active (02/14/2022)   Exercise Vital Sign    Days of Exercise per Week: 3 days    Minutes of Exercise per Session: 30 min  Stress: Stress  Concern Present (02/14/2022)   [place]    Feeling of Stress : To some extent  Social Connections: Socially Isolated (02/14/2022)   Social Connection and Isolation Panel [NHANES]    Frequency of Communication with Friends and Family: More than three times a week    Frequency of Social Gatherings with Friends and Family: Twice a week    Attends Religious Services: Never    Marine scientist or Organizations: No    Attends Music therapist: Never    Marital Status: Divorced     Observations/Objective: Appears well in NAD Hoarseness, congested Breathing normally, speaking in full sentences  Assessment and Plan:  See Problem List for  Assessment and Plan of chronic medical problems.   Follow Up Instructions:    I discussed the assessment and treatment plan with the patient. The patient was provided an opportunity to ask questions and all were answered. The patient agreed with the plan and demonstrated an understanding of the instructions.   The patient was advised to call back or seek an in-person evaluation if the symptoms worsen or if the condition fails to improve as anticipated.    [name], MD

## 2022-06-06 NOTE — Telephone Encounter (Signed)
LVM asking pt to call back to schedule EEG

## 2022-06-06 NOTE — Assessment & Plan Note (Signed)
Acute She is immunocompromised and concern for possible bacterial cause  Start Augmentin 875-125 mg BID x 10 day otc cold medications Rest, fluid Call if no improvement

## 2022-06-06 NOTE — Addendum Note (Signed)
Addended by: Gildardo Griffes on: 06/06/2022 10:01 AM   Modules accepted: Orders

## 2022-06-06 NOTE — Progress Notes (Signed)
Couldn;t make it today due to illness. Will porder eeg and overnight pulse ox in the meantime due to morning confusion,  CC:  TIA f/u, new complaint "confusion"   HPI: Follow up with Dr. [name] 06/06/2022: Patient is here for follow-up, past medical history TIAs, migraines, gastric bypass, A-fib on Eliquis, PFO status postrepair, TIA, GERD, multiple lentiginous syndrome, osteoporosis, RA and depression.  Last time she was seen was in February 2023, she was seen regarding TIA via MyChart message.  She saw [name], NP, at that time she was stable from a stroke standpoint without new or recurring stroke TIA symptoms, she was compliant on Eliquis, following with cardiology with repeat echo which showed no residual shunting from prior PFO closure.  At that time she had no concerns.  In the past she had extensive workup, for likely multiple TIA events with 30-day cardiac monitor finding A-fib and 2D echo showing evidence of PFO which was confirmed on TEE by Dr. [name].  She did have PFO closure and as above is on Eliquis.  We initially saw her for TIA versus complicated migraine several years ago and workup as above found a PFO and atrial fibrillation.  MRI at the time was negative.  Today here for an episode of confusion.  I reviewed Dr. [name] notes, last seen 05/03/2019 2:23 episodes of disorientation.  Most recent was 2 weeks prior.  She had 2 episodes of waking up feeling disoriented or confused.  The first episode was at the beginning of the year, she woke up did not have any recollection of what day it was, she asked what her ex-husband was, she is unsure how long the symptoms lasted but she thinks they slowly got better throughout the day.  The next day she woke up normal.  The last episode that happened 2 weeks prior had a friend staying with her she woke up in the morning and did not know what day it was or what she was supposed to do that day, nothing out of the ordinary or inciting events.   She is cut back on alcohol.  Significant amount of stress over the past couple of years.  From a review of notes, she was also seen in the emergency room October 26 for a fall which seem mechanical she slipped on some water fell on her right wrist.  At that time in August she was given hydrocodone.  I review of her meds also show she was on naltrexone in March 2023 and in August 2023.  She was also on hydrocodone August 2026 2023.   Reviewed images: CT 03/05/2022: Other: None.   IMPRESSION: 1. No evidence of significant acute traumatic injury to the skull or brain. 2. Mild cerebral atrophy with mild chronic microvascular ischemic changes in the cerebral white matter, as above.

## 2022-06-06 NOTE — Telephone Encounter (Signed)
Patient sick today. Waking up with AMS. Check eeg and order overnight pulse ox for her (unless she has cpap). Jillian or angel can you call and schedule eeg? Pod 4 order overnight pulseox unless she already wears a cpap if so please get me her report.  CC:  TIA f/u, new complaint "confusion"   HPI: Follow up with Dr. [name] 06/06/2022: Patient is here for follow-up, past medical history TIAs, migraines, gastric bypass, A-fib on Eliquis, PFO status postrepair, TIA, GERD, multiple lentiginous syndrome, osteoporosis, RA and depression.  Last time she was seen was in February 2023, she was seen regarding TIA via MyChart message.  She saw [name], NP, at that time she was stable from a stroke standpoint without new or recurring stroke TIA symptoms, she was compliant on Eliquis, following with cardiology with repeat echo which showed no residual shunting from prior PFO closure.  At that time she had no concerns.  In the past she had extensive workup, for likely multiple TIA events with 30-day cardiac monitor finding A-fib and 2D echo showing evidence of PFO which was confirmed on TEE by Dr. [name].  She did have PFO closure and as above is on Eliquis.  We initially saw her for TIA versus complicated migraine several years ago and workup as above found a PFO and atrial fibrillation.  MRI at the time was negative.  Today here for an episode of confusion.  I reviewed Dr. [name] notes, last seen 05/03/2019 2:23 episodes of disorientation.  Most recent was 2 weeks prior.  She had 2 episodes of waking up feeling disoriented or confused.  The first episode was at the beginning of the year, she woke up did not have any recollection of what day it was, she asked what her ex-husband was, she is unsure how long the symptoms lasted but she thinks they slowly got better throughout the day.  The next day she woke up normal.  The last episode that happened 2 weeks prior had a friend staying with her she woke up in the  morning and did not know what day it was or what she was supposed to do that day, nothing out of the ordinary or inciting events.  She is cut back on alcohol.  Significant amount of stress over the past couple of years.  From a review of notes, she was also seen in the emergency room October 26 for a fall which seem mechanical she slipped on some water fell on her right wrist.  At that time in August she was given hydrocodone.  I review of her meds also show she was on naltrexone in March 2023 and in August 2023.  She was also on hydrocodone August 2026 2023.   Reviewed images: CT 03/05/2022: Other: None.   IMPRESSION: 1. No evidence of significant acute traumatic injury to the skull or brain. 2. Mild cerebral atrophy with mild chronic microvascular ischemic changes in the cerebral white matter, as above.

## 2022-06-09 NOTE — Addendum Note (Signed)
Addended by: Gildardo Griffes on: 06/09/2022 10:59 AM   Modules accepted: Orders

## 2022-06-09 NOTE — Telephone Encounter (Signed)
Thanks so much for the update, no worries! I let [employer] know to do the ONO on room air.

## 2022-06-09 NOTE — Telephone Encounter (Signed)
Julia [name]-I spoke with patient when she called in to schedule her EEG. She said she does not wear oxygen and has never used a CPAP before. She wanted to apologize for not getting back with you as well.

## 2022-06-17 ENCOUNTER — Telehealth: Payer: Self-pay | Admitting: Internal Medicine

## 2022-06-17 ENCOUNTER — Ambulatory Visit (INDEPENDENT_AMBULATORY_CARE_PROVIDER_SITE_OTHER): Payer: Medicare Other | Admitting: Neurology

## 2022-06-17 VITALS — BP 141/84 | HR 77 | Ht 63.0 in | Wt 156.2 lb

## 2022-06-17 DIAGNOSIS — R519 Headache, unspecified: Secondary | ICD-10-CM | POA: Diagnosis not present

## 2022-06-17 DIAGNOSIS — E519 Thiamine deficiency, unspecified: Secondary | ICD-10-CM

## 2022-06-17 DIAGNOSIS — R404 Transient alteration of awareness: Secondary | ICD-10-CM

## 2022-06-17 DIAGNOSIS — E538 Deficiency of other specified B group vitamins: Secondary | ICD-10-CM

## 2022-06-17 DIAGNOSIS — G4719 Other hypersomnia: Secondary | ICD-10-CM | POA: Diagnosis not present

## 2022-06-17 DIAGNOSIS — R410 Disorientation, unspecified: Secondary | ICD-10-CM | POA: Diagnosis not present

## 2022-06-17 MED ORDER — DOXYCYCLINE HYCLATE 100 MG PO TABS: 100.0000 mg | ORAL_TABLET | Freq: Two times a day (BID) | ORAL | 0 refills | Status: AC

## 2022-06-17 NOTE — Patient Instructions (Addendum)
EEG in January Overnight pulse ox Order sleep doctor appointment to evaluate for an overnight sleep test with eeg montage 14 day monitor for nocturnal afib or nocturnal abnormal heart rhythms  In the future after workup could also consider longer heart monitoring. MRI brain with seizure and stroke protocol If another episode I would order inpatient EEG monitoring Discuss at next appointment repeat neurocog testing and other tests we now have available  Seizure, Adult A seizure is a sudden burst of abnormal electrical and chemical activity in the brain. Seizures usually last from 30 seconds to 2 minutes. The abnormal activity temporarily interrupts normal brain function. Many types of seizures can affect adults. A seizure can cause many different symptoms depending on where in the brain it starts. What are the causes? Common causes of this condition include: Fever or infection. Brain injury, head trauma, bleeding in the brain, or a brain tumor. Low levels of blood sugar or salt (sodium). Kidney problems or liver problems. Metabolic disorders or other conditions that are passed from parent to child (are inherited). Reaction to a substance, such as a drug or a medicine, or suddenly stopping the use of a substance (withdrawal). A stroke. Developmental disorders such as autism spectrum disorder or cerebral palsy. In some cases, the cause of a seizure may not be known. Some people who have a seizure never have another one. A person who has repeated seizures over time without a clear cause has a condition called epilepsy. What increases the risk? You are more likely to develop this condition if: You have a family history of epilepsy. You have had a tonic-clonic seizure before. This type of seizure causes tightening (contraction) of the muscles of the whole body and loss of consciousness. You have a history of head trauma, lack of oxygen at birth, or strokes. What are the signs or  symptoms? There are many different types of seizures. The symptoms vary depending on the type of seizure you have. Symptoms occur during the seizure. They may also occur before a seizure (aura) and after a seizure (postictal). Symptoms may include the following: Symptoms during a seizure Uncontrollable shaking (convulsions) with fast, jerky movements of muscles. Stiffening of the body. Breathing problems. Confusion, staring, or unresponsiveness. Head nodding, eye blinking or fluttering, or rapid eye movements. Drooling, grunting, or making clicking sounds with your mouth. Loss of bladder control and bowel control. Symptoms before a seizure Fear or anxiety. Nausea. Vertigo. This is a feeling like: You are moving when you are not. Your surroundings are moving when they are not. Dj vu. This is a feeling of having seen or heard something before. Odd tastes or smells. Changes in vision, such as seeing flashing lights or spots. Symptoms after a seizure Confusion. Sleepiness. Headache. Sore muscles. How is this diagnosed? This condition may be diagnosed based on: A description of your symptoms. Video of your seizures can be helpful. Your medical history. A physical exam. You may also have tests, including: Blood tests. CT scan. MRI. Electroencephalogram (EEG). This test measures electrical activity in the brain. An EEG can predict whether seizures will return. A spinal tap, also called a lumbar puncture. This is the removal and testing of fluid that surrounds the brain and spinal cord. How is this treated? Most seizures will stop on their own in less than 5 minutes, and no treatment is needed. Seizures that last longer than 5 minutes will usually need treatment. Seizures may be treated with: Medicines given through an IV. Avoiding known triggers,  such as medicines that you take for another condition. Medicines to control seizures or prevent future seizures (antiepileptics), if  epilepsy caused your seizures. Medical devices to prevent and control seizures. Surgery to stop seizures or to reduce how often seizures happen, if you have epilepsy that does not respond to medicines. A diet low in carbohydrates and high in fat (ketogenic diet). Follow these instructions at home: Medicines Take over-the-counter and prescription medicines only as told by your health care provider. Avoid any substances that may prevent your medicine from working properly, such as alcohol. Activity Follow instructions about activities, such as driving or swimming, that would be dangerous if you had another seizure. Wait until your health care provider says it is safe to do them. If you live in the [place] check with your local department of motor vehicles [facility]) to find out about local driving laws. Each state has specific rules about when you can legally drive again. Get enough rest. Lack of sleep can make seizures more likely to occur. Educating others  Teach friends and family what to do if you have a seizure. They should: Help you get down to the ground, to prevent a fall. Cushion your head and move items away from your body. Loosen any tight clothing around your neck. Turn you on your side. If you vomit, this helps keep your airway clear. Know whether or not you need emergency care. Stay with you until you recover. Also, tell them what not to do if you have a seizure. Tell them: They should not hold you down. Holding you down will not stop the seizure. They should not put anything in your mouth. General instructions Avoid anything that has ever triggered a seizure for you. Keep a seizure diary. Record what you remember about each seizure, especially anything that might have triggered it. Keep all follow-up visits. This is important. Contact a health care provider if: You have another seizure or seizures. Call each time you have a seizure. Your seizure pattern changes. You continue to  have seizures with treatment. You have symptoms of an infection or illness. Either of these might increase your risk of having a seizure. You are unable to take your medicine. Get help right away if: You have: A seizure that does not stop after 5 minutes. Several seizures in a row without a complete recovery between seizures. A seizure that makes it harder to breathe. A seizure that leaves you unable to speak or use a part of your body. You do not wake up right away after a seizure. You injure yourself during a seizure. You have confusion or pain right after a seizure. These symptoms may represent a serious problem that is an emergency. Do not wait to see if the symptoms will go away. Get medical help right away. Call your local emergency services (911 in the [place] Do not drive yourself to the hospital. Summary Seizures are caused by abnormal electrical and chemical activity in the brain. The activity disrupts normal brain function and can cause various symptoms. Seizures have many causes, including illness, head injuries, low levels of blood sugar or salt, and certain conditions. Most seizures will stop on their own in less than 5 minutes. Seizures that last longer than 5 minutes are a medical emergency and need treatment right away. Many medicines are used to treat seizures. Take over-the-counter and prescription medicines only as told by your health care provider. This information is not intended to replace advice given to you by your health  care provider. Make sure you discuss any questions you have with your health care provider. Document Revised: 01/03/2020 Document Reviewed: 01/03/2020 Elsevier Patient Education  [place].   Sleep Apnea Sleep apnea is a condition in which breathing pauses or becomes shallow during sleep. People with sleep apnea usually snore loudly. They may have times when they gasp and stop breathing for 10 seconds or more during sleep. This may happen  many times during the night. Sleep apnea disrupts your sleep and keeps your body from getting the rest that it needs. This condition can increase your risk of certain health problems, including: Heart attack. Stroke. Obesity. Type 2 diabetes. Heart failure. Irregular heartbeat. High blood pressure. The goal of treatment is to help you breathe normally again. What are the causes?  The most common cause of sleep apnea is a collapsed or blocked airway. There are three kinds of sleep apnea: Obstructive sleep apnea. This kind is caused by a blocked or collapsed airway. Central sleep apnea. This kind happens when the part of the brain that controls breathing does not send the correct signals to the muscles that control breathing. Mixed sleep apnea. This is a combination of obstructive and central sleep apnea. What increases the risk? You are more likely to develop this condition if you: Are overweight. Smoke. Have a smaller than normal airway. Are older. Are female. Drink alcohol. Take sedatives or tranquilizers. Have a family history of sleep apnea. Have a tongue or tonsils that are larger than normal. What are the signs or symptoms? Symptoms of this condition include: Trouble staying asleep. Loud snoring. Morning headaches. Waking up gasping. Dry mouth or sore throat in the morning. Daytime sleepiness and tiredness. If you have daytime fatigue because of sleep apnea, you may be more likely to have: Trouble concentrating. Forgetfulness. Irritability or mood swings. Personality changes. Feelings of depression. Sexual dysfunction. This may include loss of interest if you are female, or erectile dysfunction if you are female. How is this diagnosed? This condition may be diagnosed with: A medical history. A physical exam. A series of tests that are done while you are sleeping (sleep study). These tests are usually done in a sleep lab, but they may also be done at home. How is this  treated? Treatment for this condition aims to restore normal breathing and to ease symptoms during sleep. It may involve managing health issues that can affect breathing, such as high blood pressure or obesity. Treatment may include: Sleeping on your side. Using a decongestant if you have nasal congestion. Avoiding the use of depressants, including alcohol, sedatives, and narcotics. Losing weight if you are overweight. Making changes to your diet. Quitting smoking. Using a device to open your airway while you sleep, such as: An oral appliance. This is a custom-made mouthpiece that shifts your lower jaw forward. A continuous positive airway pressure (CPAP) device. This device blows air through a mask when you breathe out (exhale). A nasal expiratory positive airway pressure (EPAP) device. This device has valves that you put into each nostril. A bi-level positive airway pressure (BIPAP) device. This device blows air through a mask when you breathe in (inhale) and breathe out (exhale). Having surgery if other treatments do not work. During surgery, excess tissue is removed to create a wider airway. Follow these instructions at home: Lifestyle Make any lifestyle changes that your health care provider recommends. Eat a healthy, well-balanced diet. Take steps to lose weight if you are overweight. Avoid using depressants, including alcohol,  sedatives, and narcotics. Do not use any products that contain nicotine or tobacco. These products include cigarettes, chewing tobacco, and vaping devices, such as e-cigarettes. If you need help quitting, ask your health care provider. General instructions Take over-the-counter and prescription medicines only as told by your health care provider. If you were given a device to open your airway while you sleep, use it only as told by your health care provider. If you are having surgery, make sure to tell your health care provider you have sleep apnea. You may need  to bring your device with you. Keep all follow-up visits. This is important. Contact a health care provider if: The device that you received to open your airway during sleep is uncomfortable or does not seem to be working. Your symptoms do not improve. Your symptoms get worse. Get help right away if: You develop: Chest pain. Shortness of breath. Discomfort in your back, arms, or stomach. You have: Trouble speaking. Weakness on one side of your body. Drooping in your face. These symptoms may represent a serious problem that is an emergency. Do not wait to see if the symptoms will go away. Get medical help right away. Call your local emergency services (911 in the [place] Do not drive yourself to the hospital. Summary Sleep apnea is a condition in which breathing pauses or becomes shallow during sleep. The most common cause is a collapsed or blocked airway. The goal of treatment is to restore normal breathing and to ease symptoms during sleep. This information is not intended to replace advice given to you by your health care provider. Make sure you discuss any questions you have with your health care provider. Document Revised: 02/03/2021 Document Reviewed: 06/05/2020 Elsevier Patient Education  [place].

## 2022-06-17 NOTE — Telephone Encounter (Signed)
We can try another abx - sent to [facility]

## 2022-06-17 NOTE — Progress Notes (Signed)
[name]    Provider:  Dr Julia [name] [name] Requesting Provider: [name], MD Primary Care Provider:  [name], MD    CC:  TIA f/u, new complaint "confusion"  HPI: Follow up with Julia. Jaynee [name] 06/06/2022: Patient is here for follow-up, Patient wa supposed to be her on 11/27 and couldn;t make it but at that time we ordered eeg and overbight ONO. Eeg scheduled 1/9/2023past medical history TIAs, migraines, gastric bypass, A-fib on Eliquis, PFO status postrepair, TIA, GERD, multiple lentiginous syndrome, osteoporosis, RA and depression.  Last time she was seen was in February 2023, she was seen regarding TIA via MyChart message.  She saw Julia [name] Rider, NP, at that time she was stable from a stroke standpoint without new or recurring stroke TIA symptoms, she was compliant on Eliquis, following with cardiology with repeat echo which showed no residual shunting from prior PFO closure.  At that time she had no concerns.  In the past she had extensive workup, for likely multiple TIA events with 30-day cardiac monitor finding A-fib and 2D echo showing evidence of PFO which was confirmed on TEE by Julia. Burt [name].  She did have PFO closure and as above is on Eliquis.  We initially saw her for TIA versus complicated migraine several years ago and workup as above found a PFO and atrial fibrillation.  MRI at the time was negative.  Today here for an episode of confusion.  I reviewed Julia. Billey [name] notes, last seen 05/03/2019 2:23 episodes of disorientation.  Most recent was 2 weeks prior.  She had 2 episodes of waking up feeling disoriented or confused.  The first episode was at the beginning of the year, she woke up did not have any recollection of what day it was, she asked what her ex-husband was, she is unsure how long the symptoms lasted but she thinks they slowly got better throughout the day.  The next day she woke up normal.  The last episode that happened 2 weeks prior had a friend staying  with her she woke up in the morning and did not know what day it was or what she was supposed to do that day, nothing out of the ordinary or inciting events.  She is cut back on alcohol.  Significant amount of stress over the past couple of years.  She has the eeg scheduled. She woke up twice with severe confusion. She didn;t know the say, date, asked where her ex husband was, unsure how long the symptoms lasted, hasn't happened since then, EEG Jan 9th and if it happens again send eeg home for 2-3 days monitor you continuously or sleep study. She sleeps alone so not sure if she snores, she wakes up often during the night, wakes up with dry mouth keeps water by the bed, no headaches, not tired during the day or napping, she feels refreeshed in the morning. (Will ask sleep doctor) she wakes up 6-7 times a night unclear why not to go to the bathroom, may need a sleep study with eeg montage. Will refer to Julia [name] [name] in the meantime fo eeg and ONO. Hasn't gotten the ONO. Repeat MRI brain to evaluate for strokes or seizures. She is on eliquis, closed the pfo in 2021. Morning headache. She was resident of [facility] so she is highly functional. Split with her husband 2 years ago.   From a review of notes, she was also seen in the emergency room October 26 for a fall which seem mechanical she  slipped on some water fell on her right wrist.  At that time in August she was given hydrocodone.  I review of her meds also show she was on naltrexone in March 2023 and in August 2023.  She was also on hydrocodone August 2026 2023.   Reviewed images: CT 03/05/2022: Other: None.   IMPRESSION: 1. No evidence of significant acute traumatic injury to the skull or brain. 2. Mild cerebral atrophy with mild chronic microvascular ischemic changes in the cerebral white matter, as above.    Update 08/24/2021 JM: Returns for 1 year follow-up regarding TIA via MyChart video visit.  Overall stable from stroke standpoint without new or  reoccurring stroke/TIA symptoms.  Compliant on Eliquis and omega-3 without side effects.  Blood pressure not monitored at home but stable at appointments. Recently had f/u with cardiology with repeat echo which showed no residual shunting from prior PFO closure. Scheduled with Julia. Quay [name] for annual exam and repeat labs next month. No new concerns at this time.     History provided for reference purposes only Update 08/25/2020 JM: Julia [name] [name] returns for 87-monthfollow-up regarding likely TIA.  Since prior visit, she completed further evaluation for likely multiple TIA events with 30-day cardiac event monitor finding atrial fibrillation and 2D echo showing evidence of PFO which was confirmed on TEE by Julia. CBurt [name]  She had appropriate follow-up with cardiology and has been started on Eliquis 5 mg twice daily as well as underwent successful PFO closure by Julia. CBurt Knackon 06/19/2020 with use of aspirin for 3 months.  She denies any additional stroke/TIA symptoms Reports compliance with Eliquis 5 mg twice daily and aspirin '81mg'$  daily tolerating without side effects Currently on omega-3 fatty acid for cholesterol management with prior LDL 83 (08/2019) -she plans on scheduling follow-up visit with PCP in the near future for repeat lipid panel Blood pressure today 128/74   No concerns at this time  Initial consult visit 04/22/2020 Julia. AJaynee [name](copy for reference purposes only)  Julia [name] RSergentis a 71y.o. female here as requested by BBinnie Rail MD for TIA versus BPPV vs complicated migraine(patient denies having a headache that day and NO history of migraines).  Patient was seen in the emergency room February 14, 2020, presenting for headache, photophobia, lightheadedness, "everything blinking", nausea vomiting, change speech pattern, code stroke was not called, she reported becoming acutely dizzy with nausea and vomiting that morning, also vision changes which she described as flashing blinking lights, she also  reported a speech abnormality, code stroke was activated, initial CT and labs were negative, MRI was ordered with no acute abnormality, diagnosis was complex migraine versus peripheral vertigo.  She reported acute onset, there were no focal deficits in strength in the arms and legs, neurology was consulted, imaging was unremarkable, patient symptoms improved she was able to ambulate and she was discharged home.   Patient is here and states that she had a debilitating episode, lasted for many hours, she went to the emergency room, included dizziness, vomiting, slurred abnormal speech, double vision, also 2 other episodes of bouts of amnesia 24 hours each time. She does not have a history of migraines. This was something new. She could not open her eyes due to vomiting, every time she opened her eyes she vomited, Slowly became worse, she was terrified, she thought she was dieing, they called emergency, she doesn't have much recollection of being in the ambulance. No headache. Started with dizziness and had chest heaviness, the dizziness and  vertigo were non stop, not associated with movement, she denies any drug use or alcohol use. She has never had a migraine, she has no significant history of headaches or migraines. She denies any inciting event, no illnesses. She has also had Transient Global Amnesia twice.   Reviewed notes, labs and imaging from outside physicians, which showed:  I reviewed MRI of the brain images February 14, 2020: Which appeared normal.  I reviewed CTA of the head and neck reports February 14, 2020: Which were also normal and negative for large vessel occlusion or significant atherosclerotic disease.  February 14, 2020: Rapid drug screen showed benzodiazepines and THC.  CMP showed elevated glucose otherwise normal.  Review of Systems: Patient complains of symptoms per HPI. Pertinent negatives and positives per HPI. All others negative.   Social History   Socioeconomic History   Marital  status: Divorced    Spouse name: Not on file   Number of children: 0   Years of education: 12   Highest education level: High school graduate  Occupational History   Occupation: retired    Comment: president of [employer]  Tobacco Use   Smoking status: Never   Smokeless tobacco: Never  Vaping Use   Vaping Use: Never used  Substance and Sexual Activity   Alcohol use: Yes    Alcohol/week: 3.0 - 5.0 standard drinks of alcohol    Types: 3 - 5 Glasses of wine per week    Comment: per week   Drug use: Never   Sexual activity: Not on file  Other Topics Concern   Not on file  Social History Narrative   Retired 07/10/17 from [employer]   No regular exercise      Lives at home with husband   Caffeine: 2 glasses/day diet Julia pepper   Social Determinants of Health   Financial Resource Strain: Low Risk  (02/14/2022)   Overall Financial Resource Strain (CARDIA)    Difficulty of Paying Living Expenses: Not hard at all  Food Insecurity: No Food Insecurity (02/14/2022)   Hunger Vital Sign    Worried About Running Out of Food in the Last Year: Never true    Ran Out of Food in the Last Year: Never true  Transportation Needs: No Transportation Needs (02/14/2022)   PRAPARE - Hydrologist (Medical): No    Lack of Transportation (Non-Medical): No  Physical Activity: Insufficiently Active (02/14/2022)   Exercise Vital Sign    Days of Exercise per Week: 3 days    Minutes of Exercise per Session: 30 min  Stress: Stress Concern Present (02/14/2022)   [place]    Feeling of Stress : To some extent  Social Connections: Socially Isolated (02/14/2022)   Social Connection and Isolation Panel [NHANES]    Frequency of Communication with Friends and Family: More than three times a week    Frequency of Social Gatherings with Friends and Family: Twice a week    Attends Religious Services: Never    Marine scientist or  Organizations: No    Attends Archivist Meetings: Never    Marital Status: Divorced  Human resources officer Violence: Not At Risk (02/14/2022)   Humiliation, Afraid, Rape, and Kick questionnaire    Fear of Current or Ex-Partner: No    Emotionally Abused: No    Physically Abused: No    Sexually Abused: No    Family History  Problem Relation Age of Onset  Hypertension Mother    Heart disease Mother    Heart failure Mother    Hypertension Father    Ulcers Father    Gallstones Father    Kidney Stones Father    Hypertension Sister    Hypertension Brother    Heart attack Maternal Grandmother    Colon cancer Neg Hx    Stomach cancer Neg Hx    Esophageal cancer Neg Hx     Past Medical History:  Diagnosis Date   Adjustment disorder with mixed anxiety and depressed mood    Arthritis    Depression    GERD (gastroesophageal reflux disease) 01/13/2017   Hemangioma 04/18/2017   History of breast augmentation    History of colon polyps 01/14/2017   Last colonoscopy 2018 - no polyps - advised 5 years   History of gastric bypass    History of pelvis fracture    in 30's   History of tuberculosis exposure 01/14/2017   Hx of latent TB, treated for 9 months   Hyperglycemia 09/28/2020   Hypertension    Hypothyroidism 01/14/2017   Melanocytic nevi, unspecified 04/18/2017   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   Obesity    Osteoporosis 01/14/2017   07/2018 - spine  -2.3,  RFN  -1.9,  LFN  -2.3   - improvement in hips dexa 07/25/17 - spine  -3.1,   RFN  -2.2,  LFN  -2.4  - started on prolia DEXA 05/13/2010 in [place], osteopenia   Spine: -2.3   LFN  -1.7   RFN:  -1.8     FRAX  15%, 1.9%    Paroxysmal atrial fibrillation 09/29/2020   PFO with atrial septal aneurysm 06/19/2020   Plant dermatitis 01/08/2020   Pneumonia    Purpura 04/18/2017   Rheumatoid arthritis 01/13/2017   Following at [facility]ology  Has been on Prednisone, Plaquenil, Arava, Humira, etolodac, Enbrel, Orencia,  Remicade and Rituxan     Sebaceous cyst 04/18/2017   TIA (transient ischemic attack) 04/22/2020   Ventral hernia without obstruction or gangrene 11/01/2019   Noted on Ct scan 10/2019   Vitamin D deficiency 06/22/2017    Patient Active Problem List   Diagnosis Date Noted   Acute sinus infection 06/06/2022   Confusion 05/02/2022   Preop examination 02/14/2022   Aortic atherosclerosis ([place]) 01/20/2022   Right lower quadrant abdominal pain 12/22/2021   Constipation 12/22/2021   Blood in stool 12/22/2021   Alcohol abuse 09/20/2021   Paroxysmal atrial fibrillation 09/29/2020   Hyperglycemia 09/28/2020   PFO with atrial septal aneurysm s/p closure 06/19/2020   TIA (transient ischemic attack) 04/22/2020   Adjustment disorder with mixed anxiety and depressed mood    Ventral hernia without obstruction or gangrene 11/01/2019   Hypertension 08/30/2019   Vitamin D deficiency 06/22/2017   Hemangioma 04/18/2017   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   Purpura 04/18/2017   Hypothyroidism 01/14/2017   History of tuberculosis exposure 01/14/2017   Osteoporosis - [facility], Julia Remer Macho 01/14/2017   History of gastric bypass 01/14/2017   History of colon polyps 01/14/2017   Rheumatoid arthritis - [facility], Julia Remer Macho 01/13/2017   GERD (gastroesophageal reflux disease) 01/13/2017    Past Surgical History:  Procedure Laterality Date   APPENDECTOMY     BREAST ENHANCEMENT SURGERY     BUBBLE STUDY  05/27/2020   Procedure: BUBBLE STUDY;  Surgeon: [name], MD;  Location: [place];  Service: Cardiovascular;;   GASTRIC BYPASS     PATENT FORAMEN OVALE(PFO) CLOSURE  N/A 06/19/2020   Procedure: PATENT FORAMEN OVALE (PFO) CLOSURE;  Surgeon: [name], MD;  Location: [place] [facility];  Service: Cardiovascular;  Laterality: N/A;   TEE WITHOUT CARDIOVERSION N/A 05/27/2020   Procedure: TRANSESOPHAGEAL ECHOCARDIOGRAM (TEE);  Surgeon: [name], MD;  Location: [place];   Service: Cardiovascular;  Laterality: N/A;   TONSILLECTOMY      Current Outpatient Medications  Medication Sig Dispense Refill   amLODipine (NORVASC) 2.5 MG tablet TAKE 1 TABLET(2.5 MG) BY MOUTH DAILY 90 tablet 3   apixaban (ELIQUIS) 5 MG TABS tablet Take 1 tablet (5 mg total) by mouth 2 (two) times daily. 180 tablet 2   Ascorbic Acid (VITAMIN C PO) Take 3,200 mg by mouth daily. 1600 mg each     Calcium Citrate-Vitamin D (CALCIUM + D PO) Take 1 capsule by mouth 2 (two) times daily.     Cholecalciferol (VITAMIN D3) 250 MCG (10000 UT) TABS Take 5,000 Units by mouth 2 (two) times daily.      Coenzyme Q10 (COQ-10) 100 MG CAPS Take 200 mg by mouth daily.     Krill Oil 1000 MG CAPS Take 1,000 mg by mouth daily.     levothyroxine (SYNTHROID) 25 MCG tablet TAKE 1 TABLET(25 MCG) BY MOUTH DAILY 90 tablet 3   Misc Natural Products ([employer] GLUCOS-CHONDROIT-MSM TS) TABS Take 2 tablets by mouth at bedtime.     Multiple Vitamins-Minerals (CENTRUM SILVER PO) Take 1 tablet by mouth daily.      Omega-3 Fatty Acids (FISH OIL) 1000 MG CAPS Take 1,000 mg by mouth daily.     orlistat (ALLI) 60 MG capsule Take 60 mg by mouth 3 (three) times daily with meals.      OVER THE COUNTER MEDICATION Take 1,040 mg by mouth daily. Liposomal otc supplement     OZEMPIC, 2 MG/DOSE, 8 MG/3ML SOPN Inject 2 mg into the skin once a week.     Probiotic Product (PROBIOTIC PO) Take 1 capsule by mouth daily.     pyridOXINE (VITAMIN B-6) 100 MG tablet Take 200 mg by mouth daily.     riTUXimab (RITUXAN) 500 MG/50ML injection Inject 10 mg into the vein every 6 (six) months.     triamcinolone ointment (KENALOG) 0.5 % Apply 1 Application topically 2 (two) times daily. 30 g 0   UNABLE TO FIND Take 2 capsules by mouth daily. Med Name: Monolaurin 936 mg and Zinc 15 mg     valACYclovir (VALTREX) 500 MG tablet TAKE 1 TABLET(500 MG) BY MOUTH DAILY 90 tablet 1   venlafaxine XR (EFFEXOR-XR) 150 MG 24 hr capsule TAKE 1 CAPSULE(150 MG) BY MOUTH DAILY  WITH BREAKFAST 90 capsule 3   zinc gluconate 50 MG tablet Take 50 mg by mouth every Monday, Wednesday, and Friday.     doxycycline (VIBRA-TABS) 100 MG tablet Take 1 tablet (100 mg total) by mouth 2 (two) times daily for 10 days. 20 tablet 0   No current facility-administered medications for this visit.   Facility-Administered Medications Ordered in Other Visits  Medication Dose Route Frequency Provider Last Rate Last Admin   sodium chloride flush (NS) 0.9 % injection 10 mL  10 mL Intravenous PRN Angelena Form R, PA-C   20 mL at 07/16/21 1025    Allergies as of 06/17/2022 - Review Complete 06/17/2022  Allergen Reaction Noted   Methotrexate derivatives Itching 04/22/2020    Vitals: BP (!) 141/84   Pulse 77   Ht '5\' 3"'$  (1.6 m)   Wt 156  lb 3 oz (70.8 kg)   BMI 27.67 kg/m  Last Weight:  Wt Readings from Last 1 Encounters:  06/17/22 156 lb 3 oz (70.8 kg)   Last Height:   Ht Readings from Last 1 Encounters:  06/17/22 '5\' 3"'$  (1.6 m)   Exam: NAD, pleasant                  Speech:    Speech is normal; fluent and spontaneous with normal comprehension.  Cognition:    The patient is oriented to person, place, and time;     recent and remote memory intact;     language fluent;    Cranial Nerves:    The pupils are equal, round, and reactive to light.Trigeminal sensation is intact and the muscles of mastication are normal. The face is symmetric. The palate elevates in the midline. Hearing intact. Voice is normal. Shoulder shrug is normal. The tongue has normal motion without fasciculations.   Coordination:  No dysmetria  Motor Observation:    No asymmetry, no atrophy, and no involuntary movements noted. Tone:    Normal muscle tone.     Strength:    Strength is V/V in the upper and lower limbs.      Sensation: intact to LT    Assessment/Plan:  episodes of confusion, more often in the morning  EEG in January pending Overnight pulse ox - ordered Order sleep doctor  appointment to evaluate for an overnight sleep test with eeg montage ESS 14, morning headaches, excess daytime fatigue, morning confusion 14 day monitor for nocturnal afib or nocturnal abnormal heart rhythms  In the future after workup could also consider longer heart monitoring. MRI brain with seizure and stroke protocol If another episode I would order inpatient EEG monitoring Discuss at next appointment repeat neurocog testing and other tests we now have available, will order labs Blood work today  TIA No additional TIA/stroke symptoms Continue Eliquis 5 mg twice daily and omega-3 for secondary stroke prevention Discussed secondary stroke prevention measures and importance of close PCP follow-up for aggressive stroke risk factor management including HTN with BP goal<130/90 and HLD with LDL goal<70   A. Fib:  Found on cardiac monitor 05/2020.   CHA2DS2-VASc score of 5 therefore cardiology Eliquis 5 mg twice daily which they continue to manage    PFO S/p closure 08/21/2019 by Julia. Burt [name] Continue aspirin 81 mg daily until 3/10 per cards (3 month post closure)     Orders Placed This Encounter  Procedures   MR BRAIN W WO CONTRAST   TSH Rfx on Abnormal to Free T4   Vitamin B1   B12 and Folate Panel   Methylmalonic acid, serum   Ammonia   Comprehensive metabolic panel   Ambulatory referral to Sleep Studies   Cardiac event monitor     CC:  Julia [name] Rail, MD   I spent over 40 minutes of face-to-face and non-face-to-face time with patient on the  1. Episode of confusion   2. Morning headache   3. Excessive daytime sleepiness   4. Transient alteration of awareness    diagnosis.  This included previsit chart review, lab review, study review, order entry, electronic health record documentation, patient education on the different diagnostic and therapeutic options, counseling and coordination of care, risks and benefits of management, compliance, or risk factor  reduction   [facility] [address] [place], [place] [address]  Phone [phone] Fax [phone] Note: This document was prepared with digital dictation  and possible smart Company secretary. Any transcriptional errors that result from this process are unintentional.

## 2022-06-17 NOTE — Telephone Encounter (Signed)
Can you get an update on her symptoms-if the cough still productive or is it more dry?  She may need a few days of a steroid versus an antibiotic-depending on her symptoms.

## 2022-06-17 NOTE — Telephone Encounter (Signed)
Spoke with patient today. 

## 2022-06-17 NOTE — Telephone Encounter (Signed)
Patient is still coughing and congested and would like a stronger antibiotic called in.  Please advise  [phone]

## 2022-06-19 NOTE — Addendum Note (Signed)
Addended by: Sarina Ill B on: 06/19/2022 09:01 PM   Modules accepted: Orders

## 2022-06-20 ENCOUNTER — Other Ambulatory Visit (INDEPENDENT_AMBULATORY_CARE_PROVIDER_SITE_OTHER): Payer: Medicare Other

## 2022-06-20 ENCOUNTER — Other Ambulatory Visit: Payer: Self-pay | Admitting: Neurology

## 2022-06-20 ENCOUNTER — Telehealth: Payer: Self-pay | Admitting: Neurology

## 2022-06-20 DIAGNOSIS — Z8673 Personal history of transient ischemic attack (TIA), and cerebral infarction without residual deficits: Secondary | ICD-10-CM

## 2022-06-20 DIAGNOSIS — R410 Disorientation, unspecified: Secondary | ICD-10-CM

## 2022-06-20 DIAGNOSIS — I480 Paroxysmal atrial fibrillation: Secondary | ICD-10-CM

## 2022-06-20 DIAGNOSIS — R404 Transient alteration of awareness: Secondary | ICD-10-CM

## 2022-06-20 DIAGNOSIS — R519 Headache, unspecified: Secondary | ICD-10-CM

## 2022-06-20 DIAGNOSIS — G4719 Other hypersomnia: Secondary | ICD-10-CM

## 2022-06-20 DIAGNOSIS — E538 Deficiency of other specified B group vitamins: Secondary | ICD-10-CM

## 2022-06-20 DIAGNOSIS — E519 Thiamine deficiency, unspecified: Secondary | ICD-10-CM

## 2022-06-20 NOTE — Telephone Encounter (Signed)
Sent to GI for scheduling, NPR for Medicare/AARP.

## 2022-06-20 NOTE — Progress Notes (Unsigned)
Enrolled for [employer] to mail a ZIO AT Live Telemetry monitor to patients address on file.   Dr. [name] to read.

## 2022-06-21 DIAGNOSIS — Z9882 Breast implant status: Secondary | ICD-10-CM | POA: Diagnosis not present

## 2022-06-21 DIAGNOSIS — Z4889 Encounter for other specified surgical aftercare: Secondary | ICD-10-CM | POA: Diagnosis not present

## 2022-06-22 ENCOUNTER — Other Ambulatory Visit (INDEPENDENT_AMBULATORY_CARE_PROVIDER_SITE_OTHER): Payer: Self-pay

## 2022-06-22 DIAGNOSIS — R519 Headache, unspecified: Secondary | ICD-10-CM | POA: Diagnosis not present

## 2022-06-22 DIAGNOSIS — Z0289 Encounter for other administrative examinations: Secondary | ICD-10-CM

## 2022-06-22 DIAGNOSIS — E519 Thiamine deficiency, unspecified: Secondary | ICD-10-CM

## 2022-06-22 DIAGNOSIS — E538 Deficiency of other specified B group vitamins: Secondary | ICD-10-CM | POA: Diagnosis not present

## 2022-06-22 DIAGNOSIS — R410 Disorientation, unspecified: Secondary | ICD-10-CM

## 2022-06-22 DIAGNOSIS — G4719 Other hypersomnia: Secondary | ICD-10-CM | POA: Diagnosis not present

## 2022-06-22 DIAGNOSIS — R404 Transient alteration of awareness: Secondary | ICD-10-CM

## 2022-06-23 DIAGNOSIS — S52551D Other extraarticular fracture of lower end of right radius, subsequent encounter for closed fracture with routine healing: Secondary | ICD-10-CM | POA: Diagnosis not present

## 2022-06-23 DIAGNOSIS — R200 Anesthesia of skin: Secondary | ICD-10-CM | POA: Diagnosis not present

## 2022-06-27 ENCOUNTER — Encounter: Payer: Self-pay | Admitting: Neurology

## 2022-06-27 ENCOUNTER — Telehealth: Payer: Self-pay | Admitting: Cardiovascular Disease

## 2022-06-27 NOTE — Telephone Encounter (Signed)
Calling to make provider aware that pt has not yet applied Zio Monitor and have not been able to contact pt. Please advise

## 2022-06-27 NOTE — Telephone Encounter (Signed)
Per MyChart messages, pt has MRI scheduled for next week and as instructed is holding the zio until MRI has been completed. (See message below)  Julia [name]  to [facility] [facility] (supporting Jennefer Bravo)      06/27/22 11:09 AM Hi there! I have received this, thank you.  They also called to schedule my MRI for Thursday next week. They asked that I not start the 2 week heart monitor until after next week's MRI. Please let Dr [name] know. Thank you!

## 2022-06-29 ENCOUNTER — Encounter: Payer: Self-pay | Admitting: Internal Medicine

## 2022-06-29 DIAGNOSIS — E871 Hypo-osmolality and hyponatremia: Secondary | ICD-10-CM

## 2022-06-29 LAB — COMPREHENSIVE METABOLIC PANEL
ALT: 40 IU/L — ABNORMAL HIGH (ref 0–32)
AST: 41 IU/L — ABNORMAL HIGH (ref 0–40)
Albumin/Globulin Ratio: 2.5 — ABNORMAL HIGH (ref 1.2–2.2)
Albumin: 4.7 g/dL (ref 3.9–4.9)
Alkaline Phosphatase: 64 IU/L (ref 44–121)
BUN/Creatinine Ratio: 22 (ref 12–28)
BUN: 13 mg/dL (ref 8–27)
Bilirubin Total: 0.6 mg/dL (ref 0.0–1.2)
CO2: 23 mmol/L (ref 20–29)
Calcium: 10.5 mg/dL — ABNORMAL HIGH (ref 8.7–10.3)
Chloride: 90 mmol/L — ABNORMAL LOW (ref 96–106)
Creatinine, Ser: 0.6 mg/dL (ref 0.57–1.00)
Globulin, Total: 1.9 g/dL (ref 1.5–4.5)
Glucose: 82 mg/dL (ref 70–99)
Potassium: 4.7 mmol/L (ref 3.5–5.2)
Sodium: 130 mmol/L — ABNORMAL LOW (ref 134–144)
Total Protein: 6.6 g/dL (ref 6.0–8.5)
eGFR: 97 mL/min/{1.73_m2} (ref >59)

## 2022-06-29 LAB — B12 AND FOLATE PANEL
Folate: >20 ng/mL (ref >3.0)
Vitamin B-12: >2000 pg/mL — ABNORMAL HIGH (ref 232–1245)

## 2022-06-29 LAB — TSH RFX ON ABNORMAL TO FREE T4: TSH: 2.07 u[IU]/mL (ref 0.450–4.500)

## 2022-06-29 LAB — VITAMIN B1: Thiamine: 175.1 nmol/L (ref 66.5–200.0)

## 2022-06-29 LAB — METHYLMALONIC ACID, SERUM: Methylmalonic Acid: 95 nmol/L (ref 0–378)

## 2022-06-29 LAB — AMMONIA: Ammonia: 41 ug/dL (ref 34–178)

## 2022-07-06 NOTE — Telephone Encounter (Signed)
Spoke with the pt about the results and have already set-up a visit with PCP as pt wanted to know if Dr. Quay Burow wanted her to do anything about the abnormal numbers on some of the labs that was taken by Dr. [name] before her visit on 07/15/2022?  **Pt was informed that Dr. Quay Burow is out of the office due she stated that is okay.

## 2022-07-07 ENCOUNTER — Ambulatory Visit
Admission: RE | Admit: 2022-07-07 | Discharge: 2022-07-07 | Disposition: A | Discharge status: Home / Self Care | Payer: Medicare Other | Source: Ambulatory Visit | Attending: Neurology | Admitting: Neurology

## 2022-07-07 DIAGNOSIS — R404 Transient alteration of awareness: Secondary | ICD-10-CM

## 2022-07-07 DIAGNOSIS — R410 Disorientation, unspecified: Secondary | ICD-10-CM

## 2022-07-07 MED ORDER — GADOPICLENOL 0.5 MMOL/ML IV SOLN
7.0000 mL | Freq: Once | INTRAVENOUS | Status: AC | PRN
  Administered 2022-07-07: 7 mL via INTRAVENOUS

## 2022-07-08 DIAGNOSIS — I480 Paroxysmal atrial fibrillation: Secondary | ICD-10-CM

## 2022-07-08 DIAGNOSIS — R404 Transient alteration of awareness: Secondary | ICD-10-CM

## 2022-07-08 DIAGNOSIS — Z8673 Personal history of transient ischemic attack (TIA), and cerebral infarction without residual deficits: Secondary | ICD-10-CM | POA: Diagnosis not present

## 2022-07-08 DIAGNOSIS — R410 Disorientation, unspecified: Secondary | ICD-10-CM

## 2022-07-09 DIAGNOSIS — R410 Disorientation, unspecified: Secondary | ICD-10-CM | POA: Diagnosis not present

## 2022-07-09 DIAGNOSIS — I480 Paroxysmal atrial fibrillation: Secondary | ICD-10-CM | POA: Diagnosis not present

## 2022-07-12 ENCOUNTER — Other Ambulatory Visit (INDEPENDENT_AMBULATORY_CARE_PROVIDER_SITE_OTHER): Payer: Medicare Other

## 2022-07-12 DIAGNOSIS — E871 Hypo-osmolality and hyponatremia: Secondary | ICD-10-CM

## 2022-07-12 LAB — COMPREHENSIVE METABOLIC PANEL
ALT: 22 U/L (ref 0–35)
AST: 26 U/L (ref 0–37)
Albumin: 4.5 g/dL (ref 3.5–5.2)
Alkaline Phosphatase: 49 U/L (ref 39–117)
BUN: 15 mg/dL (ref 6–23)
CO2: 29 mEq/L (ref 19–32)
Calcium: 9.7 mg/dL (ref 8.4–10.5)
Chloride: 96 mEq/L (ref 96–112)
Creatinine, Ser: 0.57 mg/dL (ref 0.40–1.20)
GFR: 92.85 mL/min (ref >60.00)
Glucose, Bld: 126 mg/dL — ABNORMAL HIGH (ref 70–99)
Potassium: 3.9 mEq/L (ref 3.5–5.1)
Sodium: 134 mEq/L — ABNORMAL LOW (ref 135–145)
Total Bilirubin: 0.5 mg/dL (ref 0.2–1.2)
Total Protein: 6.7 g/dL (ref 6.0–8.3)

## 2022-07-14 NOTE — Progress Notes (Signed)
Subjective:    Patient ID: Julia [name], female    DOB: 1951/05/31, 72 y.o.   MRN: [id number]     HPI Sabrinna is here for follow up of her recent blood work.    End of November had sinus infection - needed two rounds of abx and did take a couple of prednisone pills she had a home.  This was around the time that she had the blood work in the middle of December.  Repeat blood work looks good.  Still undergoing workup for her episodes of confusion.  She did have another episode that occurred when she first woke up.  She is unsure how long it lasted.  Every episode has occurred upon wakening.  Medications and allergies reviewed with patient and updated if appropriate.  Current Outpatient Medications on File Prior to Visit  Medication Sig Dispense Refill   amLODipine (NORVASC) 2.5 MG tablet TAKE 1 TABLET(2.5 MG) BY MOUTH DAILY 90 tablet 3   apixaban (ELIQUIS) 5 MG TABS tablet Take 1 tablet (5 mg total) by mouth 2 (two) times daily. 180 tablet 2   Ascorbic Acid (VITAMIN C PO) Take 3,200 mg by mouth daily. 1600 mg each     Calcium Citrate-Vitamin D (CALCIUM + D PO) Take 1 capsule by mouth 1 day or 1 dose.     Cholecalciferol (VITAMIN D3) 250 MCG (10000 UT) TABS Take 5,000 Units by mouth 2 (two) times daily.      Coenzyme Q10 (COQ-10) 100 MG CAPS Take 200 mg by mouth daily.     Krill Oil 1000 MG CAPS Take 1,000 mg by mouth daily.     levothyroxine (SYNTHROID) 25 MCG tablet TAKE 1 TABLET(25 MCG) BY MOUTH DAILY 90 tablet 3   Misc Natural Products ([employer] GLUCOS-CHONDROIT-MSM TS) TABS Take 2 tablets by mouth at bedtime.     Multiple Vitamins-Minerals (CENTRUM SILVER PO) Take 1 tablet by mouth daily.      Omega-3 Fatty Acids (FISH OIL) 1000 MG CAPS Take 1,000 mg by mouth daily.     orlistat (ALLI) 60 MG capsule Take 60 mg by mouth 3 (three) times daily with meals.      OVER THE COUNTER MEDICATION Take 1,040 mg by mouth daily. Liposomal otc supplement     OZEMPIC, 2 MG/DOSE, 8 MG/3ML SOPN  Inject 2 mg into the skin once a week.     Probiotic Product (PROBIOTIC PO) Take 1 capsule by mouth daily.     riTUXimab (RITUXAN) 500 MG/50ML injection Inject 10 mg into the vein every 6 (six) months.     triamcinolone ointment (KENALOG) 0.5 % Apply 1 Application topically 2 (two) times daily. (Patient taking differently: Apply 1 Application topically as needed.) 30 g 0   UNABLE TO FIND Take 2 capsules by mouth daily. Med Name: Monolaurin 936 mg and Zinc 15 mg     valACYclovir (VALTREX) 500 MG tablet TAKE 1 TABLET(500 MG) BY MOUTH DAILY 90 tablet 1   venlafaxine XR (EFFEXOR-XR) 150 MG 24 hr capsule TAKE 1 CAPSULE(150 MG) BY MOUTH DAILY WITH BREAKFAST 90 capsule 3   zinc gluconate 50 MG tablet Take 50 mg by mouth every Monday, Wednesday, and Friday.     pyridOXINE (VITAMIN B-6) 100 MG tablet Take 200 mg by mouth daily. (Patient not taking: Reported on 07/15/2022)     Current Facility-Administered Medications on File Prior to Visit  Medication Dose Route Frequency Provider Last Rate Last Admin   sodium chloride flush (NS) 0.9 %  injection 10 mL  10 mL Intravenous PRN Eileen Stanford, PA-C   20 mL at 07/16/21 1025     Review of Systems     Objective:   Vitals:   07/15/22 1052  BP: 134/80  Pulse: 80  Temp: 97.9 F (36.6 C)  SpO2: 97%   BP Readings from Last 3 Encounters:  07/15/22 134/80  06/17/22 (!) 141/84  05/02/22 128/74   Wt Readings from Last 3 Encounters:  07/15/22 150 lb (68 kg)  06/17/22 156 lb 3 oz (70.8 kg)  05/02/22 154 lb (69.9 kg)   Body mass index is 26.57 kg/m.    Physical Exam     Lab Results  Component Value Date   WBC 6.4 01/18/2022   HGB 13.0 01/18/2022   HCT 40.0 01/18/2022   PLT 251.0 01/18/2022   GLUCOSE 126 (H) 07/12/2022   CHOL 181 03/22/2021   TRIG 58.0 03/22/2021   HDL 96.50 03/22/2021   LDLCALC 73 03/22/2021   ALT 22 07/12/2022   AST 26 07/12/2022   NA 134 (L) 07/12/2022   K 3.9 07/12/2022   CL 96 07/12/2022   CREATININE 0.57  07/12/2022   BUN 15 07/12/2022   CO2 29 07/12/2022   TSH 2.070 06/22/2022   INR 1.0 02/14/2020   HGBA1C 5.3 03/22/2021     Assessment & Plan:    See Problem List for Assessment and Plan of chronic medical problems.

## 2022-07-15 ENCOUNTER — Encounter: Payer: Self-pay | Admitting: Internal Medicine

## 2022-07-15 ENCOUNTER — Ambulatory Visit (INDEPENDENT_AMBULATORY_CARE_PROVIDER_SITE_OTHER): Payer: Medicare Other | Admitting: Internal Medicine

## 2022-07-15 VITALS — BP 134/80 | HR 80 | Temp 97.9°F | Ht 63.0 in | Wt 150.0 lb

## 2022-07-15 DIAGNOSIS — E871 Hypo-osmolality and hyponatremia: Secondary | ICD-10-CM | POA: Diagnosis not present

## 2022-07-15 DIAGNOSIS — R7989 Other specified abnormal findings of blood chemistry: Secondary | ICD-10-CM

## 2022-07-15 DIAGNOSIS — I10 Essential (primary) hypertension: Secondary | ICD-10-CM | POA: Diagnosis not present

## 2022-07-15 HISTORY — DX: Hypo-osmolality and hyponatremia: E87.1

## 2022-07-15 HISTORY — DX: Other specified abnormal findings of blood chemistry: R79.89

## 2022-07-15 NOTE — Patient Instructions (Addendum)
       Medications changes include :   none     Return in about 6 months (around 01/13/2023) for follow up.

## 2022-07-15 NOTE — Assessment & Plan Note (Signed)
Transient Repeat is normal Likely circumstantial-related to being sick, antibiotics, steroids Will monitor, but since normal no further evaluation needed

## 2022-07-15 NOTE — Assessment & Plan Note (Signed)
Chronic Blood pressure controlled Continue amlodipine 2.5 mg daily

## 2022-07-15 NOTE — Assessment & Plan Note (Signed)
Resolved Occurred in the mid December and likely in the setting of diarrhea/loose stools, being on antibiotics and being sick Recent repeat sodium was 134 and discussed with her I think the lower sodium was just circumstantial Will repeat in 6 months at follow-up visit

## 2022-07-19 ENCOUNTER — Other Ambulatory Visit: Payer: Medicare Other | Admitting: *Deleted

## 2022-07-19 ENCOUNTER — Encounter: Payer: Self-pay | Admitting: Neurology

## 2022-07-28 ENCOUNTER — Telehealth: Payer: Self-pay | Admitting: Internal Medicine

## 2022-07-28 ENCOUNTER — Ambulatory Visit (INDEPENDENT_AMBULATORY_CARE_PROVIDER_SITE_OTHER): Payer: Medicare Other | Admitting: Neurology

## 2022-07-28 ENCOUNTER — Encounter: Payer: Self-pay | Admitting: Internal Medicine

## 2022-07-28 DIAGNOSIS — R410 Disorientation, unspecified: Secondary | ICD-10-CM

## 2022-07-28 NOTE — Telephone Encounter (Signed)
PT calls back today regarding these symptoms emerging again within the last couple of days. PT has already tested for Covid with a negative result. I informed PT that we would prefer seeing them in office before prescribing anything but she stated Dr.Burns was already aware of the issue. I let her know we would get this back to the provider and would figure out next plan of action.  PT also stated that she is having her "EEG" today and would not be available from 9:00-10:00.  If something was to be prescribed out again she would like this sent to the [facility] in [place] that is on file.  CB: [phone]

## 2022-07-28 NOTE — Telephone Encounter (Signed)
She has had 2 antibiotics obviously this is not a bacterial infection.  It is likely viral in nature.  Ideally If she came to the office we could reevaluate.  I am not sure what to give her at this point to help the symptoms without going through all of them together.

## 2022-07-28 NOTE — Telephone Encounter (Signed)
Patient called and said the respiratory infection she had in December has come back and she wanted to know if the same antibiotic can be called in again, please advise

## 2022-07-28 NOTE — Procedures (Signed)
    History:  72 year old woman with episode of confusion   EEG classification: Awake and drowsy  Description of the recording: The background rhythms of this recording consists of a fairly well modulated medium amplitude alpha rhythm of 10-11 Hz that is reactive to eye opening and closure. Present in the anterior head region is a 15-20 Hz beta activity. Photic stimulation was performed, did not show any abnormalities. Hyperventilation was also performed, did not show any abnormalities. Drowsiness was manifested by background fragmentation. No abnormal epileptiform discharges seen during this recording. There was no focal slowing. There were no electrographic seizure identified.   Abnormality: None   Impression: This is a normal EEG recorded while drowsy and awake. No evidence of interictal epileptiform discharges. Normal EEGs, however, do not rule out epilepsy.    Alric Ran, MD [facility]

## 2022-07-28 NOTE — Progress Notes (Signed)
Subjective:    Patient ID: Julia [name], female    DOB: 23-Nov-1950, 72 y.o.   MRN: [id number]      HPI Julia [name] is here for  Chief Complaint  Patient presents with   Nasal Congestion    Re-occurring  symptoms from December: Congestion, cough, mucus and nausea (from coughing) and soft stoools; Patient had MRI, sent back heart monitor and had EEG yesterday (FYI); Neg COVID test at home.    Cold symptoms - did augmentin then doxycyline and felt better.  The symptoms started just 2 days ago.  She is not sure if it is the same thing or something different.   Symptoms started 2 days ago.  She states nasal congestion, postnasal drip, runny nose, sneezing, cough that is productive at times and she has seen brown mucus, nausea, diarrhea, body aches and dizziness.  She has not had any fevers, shortness of breath or wheezing.   Taking coricidin severe flu, prednisone she had at home.  She is taking throat lozenges.      Medications and allergies reviewed with patient and updated if appropriate.  Current Outpatient Medications on File Prior to Visit  Medication Sig Dispense Refill   amLODipine (NORVASC) 2.5 MG tablet TAKE 1 TABLET(2.5 MG) BY MOUTH DAILY 90 tablet 3   apixaban (ELIQUIS) 5 MG TABS tablet Take 1 tablet (5 mg total) by mouth 2 (two) times daily. 180 tablet 2   Ascorbic Acid (VITAMIN C PO) Take 3,200 mg by mouth daily. 1600 mg each     Calcium Citrate-Vitamin D (CALCIUM + D PO) Take 1 capsule by mouth 1 day or 1 dose.     Cholecalciferol (VITAMIN D3) 250 MCG (10000 UT) TABS Take 5,000 Units by mouth 2 (two) times daily.      Coenzyme Q10 (COQ-10) 100 MG CAPS Take 200 mg by mouth daily.     Krill Oil 1000 MG CAPS Take 1,000 mg by mouth daily.     levothyroxine (SYNTHROID) 25 MCG tablet TAKE 1 TABLET(25 MCG) BY MOUTH DAILY 90 tablet 3   Misc Natural Products ([employer]S GLUCOS-CHONDROIT-MSM TS) TABS Take 2 tablets by mouth at bedtime.     Multiple Vitamins-Minerals (CENTRUM SILVER PO)  Take 1 tablet by mouth daily.      Omega-3 Fatty Acids (FISH OIL) 1000 MG CAPS Take 1,000 mg by mouth daily.     orlistat (ALLI) 60 MG capsule Take 60 mg by mouth 3 (three) times daily with meals.      OVER THE COUNTER MEDICATION Take 1,040 mg by mouth daily. Liposomal otc supplement     OZEMPIC, 2 MG/DOSE, 8 MG/3ML SOPN Inject 2 mg into the skin once a week.     Probiotic Product (PROBIOTIC PO) Take 1 capsule by mouth daily.     riTUXimab (RITUXAN) 500 MG/50ML injection Inject 10 mg into the vein every 6 (six) months.     triamcinolone ointment (KENALOG) 0.5 % Apply 1 Application topically 2 (two) times daily. (Patient taking differently: Apply 1 Application topically as needed.) 30 g 0   UNABLE TO FIND Take 2 capsules by mouth daily. Med Name: Monolaurin 936 mg and Zinc 15 mg     valACYclovir (VALTREX) 500 MG tablet TAKE 1 TABLET(500 MG) BY MOUTH DAILY 90 tablet 1   venlafaxine XR (EFFEXOR-XR) 150 MG 24 hr capsule TAKE 1 CAPSULE(150 MG) BY MOUTH DAILY WITH BREAKFAST 90 capsule 3   zinc gluconate 50 MG tablet Take 50 mg by mouth every Monday,  Wednesday, and Friday.     Current Facility-Administered Medications on File Prior to Visit  Medication Dose Route Frequency Provider Last Rate Last Admin   sodium chloride flush (NS) 0.9 % injection 10 mL  10 mL Intravenous PRN Angelena Form R, PA-C   20 mL at 07/16/21 1025    Review of Systems  Constitutional:  Negative for fever (low grade fever).  HENT:  Positive for congestion, postnasal drip, rhinorrhea and sneezing. Negative for ear pain, sinus pressure, sinus pain and sore throat.   Respiratory:  Positive for cough (productive of brown sputum). Negative for chest tightness, shortness of breath and wheezing.   Gastrointestinal:  Positive for diarrhea and nausea.  Musculoskeletal:  Positive for myalgias.  Neurological:  Positive for dizziness. Negative for light-headedness and headaches.       Objective:   Vitals:   07/29/22 1409  BP:  (!) 140/88  Pulse: 80  Temp: 98.3 F (36.8 C)  SpO2: 99%   BP Readings from Last 3 Encounters:  07/29/22 (!) 140/88  07/15/22 134/80  06/17/22 (!) 141/84   Wt Readings from Last 3 Encounters:  07/29/22 151 lb (68.5 kg)  07/15/22 150 lb (68 kg)  06/17/22 156 lb 3 oz (70.8 kg)   Body mass index is 26.75 kg/m.    Physical Exam Constitutional:      General: She is not in acute distress.    Appearance: Normal appearance. She is not ill-appearing.  HENT:     Head: Normocephalic and atraumatic.     Right Ear: Tympanic membrane, ear canal and external ear normal.     Left Ear: Tympanic membrane, ear canal and external ear normal.     Mouth/Throat:     Mouth: Mucous membranes are moist.     Pharynx: No oropharyngeal exudate or posterior oropharyngeal erythema.  Eyes:     Conjunctiva/sclera: Conjunctivae normal.  Cardiovascular:     Rate and Rhythm: Normal rate and regular rhythm.  Pulmonary:     Effort: Pulmonary effort is normal. No respiratory distress.     Breath sounds: Normal breath sounds. No wheezing or rales.  Musculoskeletal:     Cervical back: Neck supple. No tenderness.  Lymphadenopathy:     Cervical: No cervical adenopathy.  Skin:    General: Skin is warm and dry.  Neurological:     Mental Status: She is alert.            Assessment & Plan:    See Problem List for Assessment and Plan of chronic medical problems.

## 2022-07-28 NOTE — Telephone Encounter (Signed)
Message left for patient to return call to clinic to be scheduled.  If she calls back please scheduled appointment as Dr. Quay Burow has advised.

## 2022-07-28 NOTE — Telephone Encounter (Signed)
Message left for patient to return call to clinic to be scheduled.   If she calls back please scheduled appointment as Dr. Quay Burow has advised.

## 2022-07-29 ENCOUNTER — Ambulatory Visit (INDEPENDENT_AMBULATORY_CARE_PROVIDER_SITE_OTHER): Payer: Medicare Other | Admitting: Internal Medicine

## 2022-07-29 VITALS — BP 124/76 | HR 80 | Temp 98.3°F | Ht 63.0 in | Wt 151.0 lb

## 2022-07-29 DIAGNOSIS — J069 Acute upper respiratory infection, unspecified: Secondary | ICD-10-CM

## 2022-07-29 DIAGNOSIS — I10 Essential (primary) hypertension: Secondary | ICD-10-CM

## 2022-07-29 DIAGNOSIS — R0981 Nasal congestion: Secondary | ICD-10-CM

## 2022-07-29 LAB — POCT RESPIRATORY SYNCYTIAL VIRUS: RSV Rapid Ag: NEGATIVE

## 2022-07-29 LAB — POC INFLUENZA A&B (BINAX/QUICKVUE)
Influenza A, POC: NEGATIVE
Influenza B, POC: NEGATIVE

## 2022-07-29 LAB — POC COVID19 BINAXNOW: SARS Coronavirus 2 Ag: NEGATIVE

## 2022-07-29 MED ORDER — DOXYCYCLINE HYCLATE 100 MG PO TABS: 100.0000 mg | ORAL_TABLET | Freq: Two times a day (BID) | ORAL | 0 refills | Status: AC

## 2022-07-29 NOTE — Assessment & Plan Note (Signed)
Acute Symptoms very suggestive of a viral illness and that is likely what it is COVID, flu and RSV tests here today are negative Advised symptomatic treatment for now-Coricidin, Mucinex, saline spray, and the over-the-counter cold medications that help Rest, fluids Because of her history I will give a prescription for doxycycline to use only if her symptoms are not better by the end of the weekend or if they worsen Call with questions or concerns

## 2022-07-29 NOTE — Assessment & Plan Note (Signed)
Chronic Blood pressure is well-controlled Continue amlodipine 2.5 mg daily

## 2022-07-29 NOTE — Patient Instructions (Addendum)
      Your covid, flu and rsv tests are negative.     Medications changes include :   doxycyline if your symptoms are not improving at the end of the weekend.      Return if symptoms worsen or fail to improve.

## 2022-08-15 ENCOUNTER — Institutional Professional Consult (permissible substitution): Payer: Medicare Other | Admitting: Neurology

## 2022-08-15 ENCOUNTER — Encounter: Payer: Self-pay | Admitting: Neurology

## 2022-08-18 ENCOUNTER — Encounter (HOSPITAL_COMMUNITY): Payer: Self-pay | Admitting: *Deleted

## 2022-09-01 ENCOUNTER — Other Ambulatory Visit: Payer: Self-pay | Admitting: *Deleted

## 2022-09-01 DIAGNOSIS — I480 Paroxysmal atrial fibrillation: Secondary | ICD-10-CM

## 2022-09-01 MED ORDER — APIXABAN 5 MG PO TABS: 5.0000 mg | ORAL_TABLET | Freq: Two times a day (BID) | ORAL | 1 refills | Status: DC

## 2022-09-01 NOTE — Telephone Encounter (Signed)
Eliquis 64m refill request received. Patient is 72years old, weight-68.5kg, Crea-0.57 on 07/12/22, Diagnosis-Afib, and last seen by Dr. CBurt Knackon 02/15/22. Dose is appropriate based on dosing criteria. Will send in refill to requested pharmacy.

## 2022-09-06 ENCOUNTER — Encounter: Payer: Self-pay | Admitting: Neurology

## 2022-09-06 ENCOUNTER — Ambulatory Visit (INDEPENDENT_AMBULATORY_CARE_PROVIDER_SITE_OTHER): Payer: Medicare Other | Admitting: Neurology

## 2022-09-06 VITALS — BP 119/81 | HR 79 | Ht 64.0 in | Wt 146.0 lb

## 2022-09-06 DIAGNOSIS — Q2112 Patent foramen ovale: Secondary | ICD-10-CM

## 2022-09-06 DIAGNOSIS — F4323 Adjustment disorder with mixed anxiety and depressed mood: Secondary | ICD-10-CM

## 2022-09-06 DIAGNOSIS — R6889 Other general symptoms and signs: Secondary | ICD-10-CM

## 2022-09-06 DIAGNOSIS — G4751 Confusional arousals: Secondary | ICD-10-CM | POA: Diagnosis not present

## 2022-09-06 DIAGNOSIS — R404 Transient alteration of awareness: Secondary | ICD-10-CM | POA: Diagnosis not present

## 2022-09-06 HISTORY — DX: Confusional arousals: G47.51

## 2022-09-06 HISTORY — DX: Other general symptoms and signs: R68.89

## 2022-09-06 NOTE — Patient Instructions (Signed)
Confusional arousals , episodes of disorientation.   We will check you for hypoxia, apnea, cardiac arrhythmia.
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
## 2022-09-06 NOTE — Telephone Encounter (Signed)
Forwarding to Rx Prior Auth Team 

## 2022-09-12 NOTE — Telephone Encounter (Signed)
Prolia VOB initiated via [email or web address] 

## 2022-09-15 ENCOUNTER — Other Ambulatory Visit: Payer: Self-pay | Admitting: Internal Medicine

## 2022-09-16 NOTE — Telephone Encounter (Signed)
Pt ready for scheduling for Prolia on or after : 10/12/22  Out-of-pocket cost due at time of visit: $0  Primary: [place] [employer] Prolia co-insurance: 0% Admin fee co-insurance: 0%  Secondary: [employer] Prolia co-insurance:  Admin fee co-insurance:   Medical Benefit Details: Date Benefits were checked: 09/12/22 Deductible: NO/ Coinsurance: 0%/ Admin Fee: 0%  Prior Auth: N/A PA# Expiration Date:    Pharmacy benefit: Copay $--- If patient wants fill through the pharmacy benefit please send prescription to:  --- , and include estimated need by date in rx notes. Pharmacy will ship medication directly to the office.  Patient NOT eligible for Prolia Copay Card. Copay Card can make patient's cost as little as $25. Link to apply: [email or web address]://[email or web address]  ** This summary of benefits is an estimation of the patient's out-of-pocket cost. Exact cost may very based on individual plan coverage.

## 2022-09-19 DIAGNOSIS — Z79899 Other long term (current) drug therapy: Secondary | ICD-10-CM | POA: Diagnosis not present

## 2022-09-19 DIAGNOSIS — Z5181 Encounter for therapeutic drug level monitoring: Secondary | ICD-10-CM | POA: Diagnosis not present

## 2022-09-19 DIAGNOSIS — M0609 Rheumatoid arthritis without rheumatoid factor, multiple sites: Secondary | ICD-10-CM | POA: Diagnosis not present

## 2022-09-27 DIAGNOSIS — Z45811 Encounter for adjustment or removal of right breast implant: Secondary | ICD-10-CM | POA: Diagnosis not present

## 2022-09-27 DIAGNOSIS — Z45812 Encounter for adjustment or removal of left breast implant: Secondary | ICD-10-CM | POA: Diagnosis not present

## 2022-09-27 DIAGNOSIS — Z9882 Breast implant status: Secondary | ICD-10-CM | POA: Diagnosis not present

## 2022-10-06 ENCOUNTER — Telehealth: Payer: Self-pay | Admitting: Neurology

## 2022-10-06 NOTE — Telephone Encounter (Signed)
NPSG- Medicare/AARP No auth req   Patient is scheduled at [facility] for 12/19/22 at 8 pm.  Mailed packet to the patient.

## 2022-10-07 ENCOUNTER — Encounter: Payer: Self-pay | Admitting: Internal Medicine

## 2022-10-15 ENCOUNTER — Encounter: Payer: Self-pay | Admitting: Emergency Medicine

## 2022-10-15 ENCOUNTER — Ambulatory Visit
Admission: EM | Admit: 2022-10-15 | Discharge: 2022-10-15 | Disposition: A | Discharge status: Home / Self Care | Payer: Medicare Other | Attending: Family Medicine | Admitting: Family Medicine

## 2022-10-15 ENCOUNTER — Other Ambulatory Visit: Payer: Self-pay

## 2022-10-15 ENCOUNTER — Ambulatory Visit (INDEPENDENT_AMBULATORY_CARE_PROVIDER_SITE_OTHER): Payer: Medicare Other

## 2022-10-15 DIAGNOSIS — R059 Cough, unspecified: Secondary | ICD-10-CM

## 2022-10-15 DIAGNOSIS — J309 Allergic rhinitis, unspecified: Secondary | ICD-10-CM | POA: Diagnosis not present

## 2022-10-15 DIAGNOSIS — R0989 Other specified symptoms and signs involving the circulatory and respiratory systems: Secondary | ICD-10-CM | POA: Diagnosis not present

## 2022-10-15 DIAGNOSIS — J0100 Acute maxillary sinusitis, unspecified: Secondary | ICD-10-CM | POA: Diagnosis not present

## 2022-10-15 MED ORDER — BENZONATATE 200 MG PO CAPS: 200.0000 mg | ORAL_CAPSULE | Freq: Three times a day (TID) | ORAL | 0 refills | Status: AC | PRN

## 2022-10-15 MED ORDER — AMOXICILLIN-POT CLAVULANATE 875-125 MG PO TABS: 1.0000 | ORAL_TABLET | Freq: Two times a day (BID) | ORAL | 0 refills | Status: DC

## 2022-10-15 MED ORDER — PROMETHAZINE-DM 6.25-15 MG/5ML PO SYRP: 5.0000 mL | ORAL_SOLUTION | Freq: Two times a day (BID) | ORAL | 0 refills | Status: DC | PRN

## 2022-10-15 MED ORDER — FEXOFENADINE HCL 180 MG PO TABS: 180.0000 mg | ORAL_TABLET | Freq: Every day | ORAL | 0 refills | Status: DC

## 2022-10-15 NOTE — Discharge Instructions (Addendum)
Advised patient to take medications as directed with food to completion.  Advised patient to take Allegra with Augmentin for the next 5 of 7 days.  Advised may use Allegra as needed afterwards.  Advised may use Tessalon daily or as needed for cough.  Advised may use Promethazine DM at night for cough due to sedative effects.  Encouraged patient to increase daily water intake to 64 ounces per day while taking these medications.  Advised if symptoms worsen and/or unresolved please follow-up PCP or here for further evaluation.

## 2022-10-15 NOTE — ED Provider Notes (Signed)
[name]    CSN: [id number] Arrival date & time: 10/15/22  1115      History   Chief Complaint Chief Complaint  Patient presents with   Cough    HPI Julia [name] is a 72 y.o. female.   HPI Very pleasant 72 year old female presents with sinus congestion and cough for 10 days.  Reports negative COVID-19 test 1 week ago.  Patient is concerned with pneumonia.  PMH significant for paroxysmal atrial fibrillation, history of gastric bypass, and GERD.  Past Medical History:  Diagnosis Date   Adjustment disorder with mixed anxiety and depressed mood    Arthritis    Depression    GERD (gastroesophageal reflux disease) 01/13/2017   Hemangioma 04/18/2017   History of breast augmentation    History of colon polyps 01/14/2017   Last colonoscopy 2018 - no polyps - advised 5 years   History of gastric bypass    History of pelvis fracture    in 30's   History of tuberculosis exposure 01/14/2017   Hx of latent TB, treated for 9 months   Hyperglycemia 09/28/2020   Hypertension    Hypothyroidism 01/14/2017   Melanocytic nevi, unspecified 04/18/2017   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   Obesity    Osteoporosis 01/14/2017   07/2018 - spine  -2.3,  RFN  -1.9,  LFN  -2.3   - improvement in hips dexa 07/25/17 - spine  -3.1,   RFN  -2.2,  LFN  -2.4  - started on prolia DEXA 05/13/2010 in [place], osteopenia   Spine: -2.3   LFN  -1.7   RFN:  -1.8     FRAX  15%, 1.9%    Paroxysmal atrial fibrillation 09/29/2020   PFO with atrial septal aneurysm 06/19/2020   Plant dermatitis 01/08/2020   Pneumonia    Purpura 04/18/2017   Rheumatoid arthritis 01/13/2017   Following at [facility] rheumatology  Has been on Prednisone, Plaquenil, Arava, Humira, etolodac, Enbrel, Orencia, Remicade and Rituxan     Sebaceous cyst 04/18/2017   TIA (transient ischemic attack) 04/22/2020   Ventral hernia without obstruction or gangrene 11/01/2019   Noted on Ct scan 10/2019   Vitamin D deficiency 06/22/2017     Patient Active Problem List   Diagnosis Date Noted   Confusional arousals 09/06/2022   Spells of decreased attentiveness 09/06/2022   Hyponatremia 07/15/2022   Elevated LFTs 07/15/2022   Acute sinus infection 06/06/2022   Confusion 05/02/2022   Preop examination 02/14/2022   Aortic atherosclerosis 01/20/2022   Right lower quadrant abdominal pain 12/22/2021   Constipation 12/22/2021   Blood in stool 12/22/2021   Alcohol abuse 09/20/2021   Paroxysmal atrial fibrillation 09/29/2020   Hyperglycemia 09/28/2020   PFO with atrial septal aneurysm s/p closure 06/19/2020   URI (upper respiratory infection) 06/02/2020   TIA (transient ischemic attack) 04/22/2020   Adjustment disorder with mixed anxiety and depressed mood    Ventral hernia without obstruction or gangrene 11/01/2019   Hypertension 08/30/2019   Vitamin D deficiency 06/22/2017   Hemangioma 04/18/2017   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   Purpura 04/18/2017   Hypothyroidism 01/14/2017   History of tuberculosis exposure 01/14/2017   Osteoporosis - [facility], Dr Alene Mires 01/14/2017   History of gastric bypass 01/14/2017   History of colon polyps 01/14/2017   Rheumatoid arthritis - [facility], Dr Alene Mires 01/13/2017   GERD (gastroesophageal reflux disease) 01/13/2017    Past Surgical History:  Procedure Laterality Date   APPENDECTOMY     breast enhancement  removal Bilateral 03/2022   BREAST ENHANCEMENT SURGERY     BUBBLE STUDY  05/27/2020   Procedure: BUBBLE STUDY;  Surgeon: Sande Rives, MD;  Location: [facility] ENDOSCOPY;  Service: Cardiovascular;;   GASTRIC BYPASS     PATENT FORAMEN OVALE(PFO) CLOSURE N/A 06/19/2020   Procedure: PATENT FORAMEN OVALE (PFO) CLOSURE;  Surgeon: Tonny Bollman, MD;  Location: [facility] INVASIVE CV LAB;  Service: Cardiovascular;  Laterality: N/A;   TEE WITHOUT CARDIOVERSION N/A 05/27/2020   Procedure: TRANSESOPHAGEAL ECHOCARDIOGRAM (TEE);  Surgeon: Sande Rives, MD;  Location: [facility]  ENDOSCOPY;  Service: Cardiovascular;  Laterality: N/A;   TONSILLECTOMY      OB History   No obstetric history on file.      Home Medications    Prior to Admission medications   Medication Sig Start Date End Date Taking? Authorizing Provider  amoxicillin-clavulanate (AUGMENTIN) 875-125 MG tablet Take 1 tablet by mouth every 12 (twelve) hours. 10/15/22  Yes Trevor Iha, FNP  benzonatate (TESSALON) 200 MG capsule Take 1 capsule (200 mg total) by mouth 3 (three) times daily as needed for up to 7 days. 10/15/22 10/22/22 Yes Trevor Iha, FNP  fexofenadine [facility] ALLERGY) 180 MG tablet Take 1 tablet (180 mg total) by mouth daily for 15 days. 10/15/22 10/30/22 Yes Trevor Iha, FNP  promethazine-dextromethorphan (PROMETHAZINE-DM) 6.25-15 MG/5ML syrup Take 5 mLs by mouth 2 (two) times daily as needed for cough. 10/15/22  Yes Trevor Iha, FNP  amLODipine (NORVASC) 2.5 MG tablet TAKE 1 TABLET(2.5 MG) BY MOUTH DAILY 04/26/22   Burns, Bobette Mo, MD  apixaban (ELIQUIS) 5 MG TABS tablet Take 1 tablet (5 mg total) by mouth 2 (two) times daily. 09/01/22   Tonny Bollman, MD  Ascorbic Acid (VITAMIN C PO) Take 3,200 mg by mouth daily. 1600 mg each    [provider]  Calcium Citrate-Vitamin D (CALCIUM + D PO) Take 1 capsule by mouth 1 day or 1 dose.    [provider]  Cholecalciferol (VITAMIN D3) 250 MCG (10000 UT) TABS Take 5,000 Units by mouth 2 (two) times daily.     [provider]  Coenzyme Q10 (COQ-10) 100 MG CAPS Take 200 mg by mouth daily.    [provider]  Boris Lown Oil 1000 MG CAPS Take 1,000 mg by mouth daily.    [provider]  levothyroxine (SYNTHROID) 25 MCG tablet TAKE 1 TABLET(25 MCG) BY MOUTH DAILY 01/17/22   Burns, Bobette Mo, MD  Misc Natural Products ([employer] GLUCOS-CHONDROIT-MSM TS) TABS Take 2 tablets by mouth at bedtime.    [provider]  Multiple Vitamins-Minerals (CENTRUM SILVER PO) Take 1 tablet by mouth daily.     [provider]  Omega-3 Fatty Acids (FISH OIL) 1000 MG CAPS Take 1,000 mg by mouth daily. Patient not taking: Reported on 10/15/2022    [provider]  orlistat (ALLI) 60 MG capsule Take 60 mg by mouth 3 (three) times daily with meals.     [provider]  OVER THE COUNTER MEDICATION Take 1,040 mg by mouth daily. Liposomal otc supplement    [provider]  OZEMPIC, 2 MG/DOSE, 8 MG/3ML SOPN Inject 2 mg into the skin once a week. 01/12/22   [provider]  Probiotic Product (PROBIOTIC PO) Take 1 capsule by mouth daily.    [provider]  riTUXimab (RITUXAN) 500 MG/50ML injection Inject 10 mg into the vein every 6 (six) months.    [provider]  triamcinolone ointment (KENALOG) 0.5 % Apply 1  Application topically 2 (two) times daily. Patient taking differently: Apply 1 Application topically as needed. 02/14/22   Pincus Sanes, MD  UNABLE TO FIND Take 2 capsules by mouth daily. Med Name: Monolaurin 936 mg and Zinc 15 mg    [provider]  valACYclovir (VALTREX) 500 MG tablet TAKE 1 TABLET(500 MG) BY MOUTH DAILY 09/15/22   Pincus Sanes, MD  venlafaxine XR (EFFEXOR-XR) 150 MG 24 hr capsule TAKE 1 CAPSULE(150 MG) BY MOUTH DAILY WITH BREAKFAST 05/17/22   Pincus Sanes, MD  zinc gluconate 50 MG tablet Take 50 mg by mouth every Monday, Wednesday, and Friday.    [provider]    Family History Family History  Problem Relation Age of Onset   Hypertension Mother    Heart disease Mother    Heart failure Mother    Hypertension Father    Ulcers Father    Gallstones Father    Kidney Stones Father    Hypertension Sister    Hypertension Brother    Heart attack Maternal Grandmother    Colon cancer Neg Hx    Stomach cancer Neg Hx    Esophageal cancer Neg Hx     Social History Social History   Tobacco Use   Smoking status: Never   Smokeless tobacco: Never  Vaping Use   Vaping Use: Never used  Substance Use Topics    Alcohol use: Yes    Alcohol/week: 3.0 - 5.0 standard drinks of alcohol    Types: 3 - 5 Glasses of wine per week    Comment: per week   Drug use: Never     Allergies   Patient has no known allergies.   Review of Systems Review of Systems  HENT:  Positive for congestion, postnasal drip and rhinorrhea.   Respiratory:  Positive for cough.   All other systems reviewed and are negative.    Physical Exam Triage Vital Signs ED Triage Vitals  Enc Vitals Group     BP 10/15/22 1140 118/79     Pulse Rate 10/15/22 1140 76     Resp 10/15/22 1140 16     Temp 10/15/22 1140 98.4 F (36.9 C)     Temp Source 10/15/22 1140 Oral     SpO2 10/15/22 1140 99 %     Weight 10/15/22 1141 142 lb (64.4 kg)     Height 10/15/22 1141 5\' 3"  (1.6 m)     Head Circumference --      Peak Flow --      Pain Score 10/15/22 1140 0     Pain Loc --      Pain Edu? --      Excl. in GC? --    No data found.  Updated Vital Signs BP 118/79 (BP Location: Left Arm)   Pulse 76   Temp 98.4 F (36.9 C) (Oral)   Resp 16   Ht 5\' 3"  (1.6 m)   Wt 142 lb (64.4 kg)   SpO2 99%   BMI 25.15 kg/m      Physical Exam Vitals and nursing note reviewed.  Constitutional:      Appearance: Normal appearance. She is obese. She is ill-appearing.  HENT:     Head: Normocephalic and atraumatic.     Right Ear: Tympanic membrane and external ear normal.     Left Ear: Tympanic membrane and external ear normal.     Ears:     Comments: Significant eustachian tube dysfunction noted bilaterally    Nose:  Comments: Turbinates are erythematous/edematous    Mouth/Throat:     Mouth: Mucous membranes are moist.     Pharynx: Oropharynx is clear.     Comments: Significant amount of clear drainage of posterior oropharynx noted Eyes:     Extraocular Movements: Extraocular movements intact.     Conjunctiva/sclera: Conjunctivae normal.     Pupils: Pupils are equal, round, and reactive to light.  Cardiovascular:     Rate and  Rhythm: Normal rate and regular rhythm.     Pulses: Normal pulses.     Heart sounds: Normal heart sounds.  Pulmonary:     Effort: Pulmonary effort is normal.     Breath sounds: Normal breath sounds. No wheezing, rhonchi or rales.  Abdominal:     General: Bowel sounds are normal.  Musculoskeletal:        General: Normal range of motion.     Cervical back: Normal range of motion and neck supple.  Skin:    General: Skin is warm and dry.  Neurological:     General: No focal deficit present.     Mental Status: She is alert and oriented to person, place, and time. Mental status is at baseline.  Psychiatric:        Mood and Affect: Mood normal.        Behavior: Behavior normal.        Thought Content: Thought content normal.      UC Treatments / Results  Labs (all labs ordered are listed, but only abnormal results are displayed) Labs Reviewed - No data to display  EKG   Radiology DG Chest 2 View  Result Date: 10/15/2022 CLINICAL DATA:  Productive cough and congestion. EXAM: CHEST - 2 VIEW COMPARISON:  Included portions from cardiac CT 09/04/2018 FINDINGS: The cardiomediastinal contours are normal. Atrial septal closure device in place. The lungs are clear. Pulmonary vasculature is normal. No consolidation, pleural effusion, or pneumothorax. No acute osseous abnormalities are seen. IMPRESSION: No acute findings or explanation for cough. Electronically Signed   By: Narda Rutherford M.D.   On: 10/15/2022 12:03    Procedures Procedures (including critical care time)  Medications Ordered in UC Medications - No data to display  Initial Impression / Assessment and Plan / UC Course  I have reviewed the triage vital signs and the nursing notes.  Pertinent labs & imaging results that were available during my care of the patient were reviewed by me and considered in my medical decision making (see chart for details).     MDM: 1.  Acute maxillary sinusitis, recurrence not specified-Rx'd  Augmentin 825/125 mg twice daily x 7 days; 2.  Cough, unspecified type-Rx'd Tessalon 200 mg 3 times daily, as needed, Promethazine DM 6.25-15 mg/5 mL-take 2 mL p.o. twice daily as needed for cough; 3.  Allergic rhinitis, unspecified seasonality, unspecified trigger-Rx'd Allegra 180 mg daily x 5 days, then as needed for concurrent postnasal drip/drainage. Advised patient to take medications as directed with food to completion.  Advised patient to take Allegra with Augmentin for the next 5 of 7 days.  Advised may use Allegra as needed afterwards.  Advised may use Tessalon daily or as needed for cough.  Advised may use Promethazine DM at night for cough due to sedative effects.  Encouraged patient to increase daily water intake to 64 ounces per day while taking these medications.  Advised if symptoms worsen and/or unresolved please follow-up PCP or here for further evaluation.  Final Clinical Impressions(s) / UC Diagnoses  Final diagnoses:  Acute maxillary sinusitis, recurrence not specified  Cough, unspecified type  Allergic rhinitis, unspecified seasonality, unspecified trigger     Discharge Instructions      Advised patient to take medications as directed with food to completion.  Advised patient to take Allegra with Augmentin for the next 5 of 7 days.  Advised may use Allegra as needed afterwards.  Advised may use Tessalon daily or as needed for cough.  Advised may use Promethazine DM at night for cough due to sedative effects.  Encouraged patient to increase daily water intake to 64 ounces per day while taking these medications.  Advised if symptoms worsen and/or unresolved please follow-up PCP or here for further evaluation.     ED Prescriptions     Medication Sig Dispense Auth. Provider   amoxicillin-clavulanate (AUGMENTIN) 875-125 MG tablet Take 1 tablet by mouth every 12 (twelve) hours. 14 tablet Trevor Ihaagan, Dantavious Snowball, FNP   fexofenadine [facility](ALLEGRA ALLERGY) 180 MG tablet Take 1 tablet (180 mg  total) by mouth daily for 15 days. 15 tablet Trevor Ihaagan, Arnitra Sokoloski, FNP   benzonatate (TESSALON) 200 MG capsule Take 1 capsule (200 mg total) by mouth 3 (three) times daily as needed for up to 7 days. 40 capsule Trevor Ihaagan, Taliah Porche, FNP   promethazine-dextromethorphan (PROMETHAZINE-DM) 6.25-15 MG/5ML syrup Take 5 mLs by mouth 2 (two) times daily as needed for cough. 118 mL Trevor Ihaagan, Derrian Poli, FNP      PDMP not reviewed this encounter.   Trevor IhaRagan, Job Holtsclaw, FNP 10/15/22 1320

## 2022-10-15 NOTE — ED Triage Notes (Signed)
Sinus congestion x 10 days w/ a cough  OTC meds - coricidin COVID test negative 1 week ago  No temps the last 2 days  Pt has concerns for pneumonia due to past hx

## 2022-10-16 ENCOUNTER — Telehealth: Payer: Self-pay | Admitting: Emergency Medicine

## 2022-10-16 ENCOUNTER — Other Ambulatory Visit: Payer: Self-pay | Admitting: Internal Medicine

## 2022-10-16 NOTE — Telephone Encounter (Signed)
Call to see how Julia [name] was today. Pt stated she was feeling better and was resting at home today. Pt thanked RN for the follow up call and dor the care yesterday.

## 2022-10-17 ENCOUNTER — Ambulatory Visit (INDEPENDENT_AMBULATORY_CARE_PROVIDER_SITE_OTHER): Payer: Medicare Other | Admitting: Neurology

## 2022-10-17 ENCOUNTER — Other Ambulatory Visit: Payer: Self-pay | Admitting: Neurology

## 2022-10-17 ENCOUNTER — Encounter: Payer: Self-pay | Admitting: Neurology

## 2022-10-17 VITALS — BP 122/69 | HR 75 | Ht 64.0 in | Wt 149.4 lb

## 2022-10-17 DIAGNOSIS — R410 Disorientation, unspecified: Secondary | ICD-10-CM

## 2022-10-17 DIAGNOSIS — Z79899 Other long term (current) drug therapy: Secondary | ICD-10-CM

## 2022-10-17 DIAGNOSIS — R7309 Other abnormal glucose: Secondary | ICD-10-CM | POA: Diagnosis not present

## 2022-10-17 NOTE — Progress Notes (Unsigned)
[facility]    Provider:  Dr Lucia Gaskins Requesting Provider: Pincus Sanes, MD Primary Care Provider:  Pincus Sanes, MD    CC:  TIA f/u, new complaint "confusion"  10/17/2022: episodes of confusion, Confusional arousals from sleep, not without sleep. Months apart. 5 times in 15 months. Last one was in February 2024. She wakes up, monotone, friend said no herself, confused, she felt foggy, she didn;t know what day of the week it was. Lasts 2-3 hours. Wakes up with confusion, she doesn't know what is going on that day, only in the mornings out of sleep. Reviewed all workup, nothing is found. At night meds: valtrex, eliquis, calcium, zinc gluconate. Stop zinc, prob doesn't need it. Copper absorption can be impaired by zinc.  Pending sleep eval with EEG montage If negative admit into the hospital to EMU unit  HPI: Follow up with Dr. Lucia Gaskins 06/06/2022: Patient is here for follow-up, Patient wa supposed to be her on 11/27 and couldn;t make it but at that time we ordered eeg and overbight ONO. Eeg scheduled 1/9/2023past medical history TIAs, migraines, gastric bypass, A-fib on Eliquis, PFO status postrepair, TIA, GERD, multiple lentiginous syndrome, osteoporosis, RA and depression.  Last time she was seen was in February 2023, she was seen regarding TIA via MyChart message.  She saw Ihor Austin, NP, at that time she was stable from a stroke standpoint without new or recurring stroke TIA symptoms, she was compliant on Eliquis, following with cardiology with repeat echo which showed no residual shunting from prior PFO closure.  At that time she had no concerns.  In the past she had extensive workup, for likely multiple TIA events with 30-day cardiac monitor finding A-fib and 2D echo showing evidence of PFO which was confirmed on TEE by Dr. Excell Seltzer.  She did have PFO closure and as above is on Eliquis.  We initially saw her for TIA versus complicated migraine several years ago and workup as  above found a PFO and atrial fibrillation.  MRI at the time was negative.  Today here for an episode of confusion.  I reviewed Dr. Cheryll Cockayne notes, last seen 05/03/2019 2:23 episodes of disorientation.  Most recent was 2 weeks prior.  She had 2 episodes of waking up feeling disoriented or confused.  The first episode was at the beginning of the year, she woke up did not have any recollection of what day it was, she asked what her ex-husband was, she is unsure how long the symptoms lasted but she thinks they slowly got better throughout the day.  The next day she woke up normal.  The last episode that happened 2 weeks prior had a friend staying with her she woke up in the morning and did not know what day it was or what she was supposed to do that day, nothing out of the ordinary or inciting events.  She is cut back on alcohol.  Significant amount of stress over the past couple of years.  She has the eeg scheduled. She woke up twice with severe confusion. She didn;t know the say, date, asked where her ex husband was, unsure how long the symptoms lasted, hasn't happened since then, EEG Jan 9th and if it happens again send eeg home for 2-3 days monitor you continuously or sleep study. She sleeps alone so not sure if she snores, she wakes up often during the night, wakes up with dry mouth keeps water by the bed, no headaches, not tired during the  day or napping, she feels refreeshed in the morning. (Will ask sleep doctor) she wakes up 6-7 times a night unclear why not to go to the bathroom, may need a sleep study with eeg montage. Will refer to dr dohmeier in the meantime fo eeg and ONO. Hasn't gotten the ONO. Repeat MRI brain to evaluate for strokes or seizures. She is on eliquis, closed the pfo in 2021. Morning headache. She was resident of [facility] so she is highly functional. Split with her husband 2 years ago.   From a review of notes, she was also seen in the emergency room October 26 for a fall which seem  mechanical she slipped on some water fell on her right wrist.  At that time in August she was given hydrocodone.  I review of her meds also show she was on naltrexone in March 2023 and in August 2023.  She was also on hydrocodone August 2026 2023.   Reviewed images: CT 03/05/2022: Other: None.   IMPRESSION: 1. No evidence of significant acute traumatic injury to the skull or brain. 2. Mild cerebral atrophy with mild chronic microvascular ischemic changes in the cerebral white matter, as above.    Update 08/24/2021 JM: Returns for 1 year follow-up regarding TIA via MyChart video visit.  Overall stable from stroke standpoint without new or reoccurring stroke/TIA symptoms.  Compliant on Eliquis and omega-3 without side effects.  Blood pressure not monitored at home but stable at appointments. Recently had f/u with cardiology with repeat echo which showed no residual shunting from prior PFO closure. Scheduled with Dr. Lawerance Bach for annual exam and repeat labs next month. No new concerns at this time.     History provided for reference purposes only Update 08/25/2020 JM: Ms. Mccamy returns for 7-month follow-up regarding likely TIA.  Since prior visit, she completed further evaluation for likely multiple TIA events with 30-day cardiac event monitor finding atrial fibrillation and 2D echo showing evidence of PFO which was confirmed on TEE by Dr. Excell Seltzer.  She had appropriate follow-up with cardiology and has been started on Eliquis 5 mg twice daily as well as underwent successful PFO closure by Dr. Excell Seltzer on 06/19/2020 with use of aspirin for 3 months.  She denies any additional stroke/TIA symptoms Reports compliance with Eliquis 5 mg twice daily and aspirin  daily tolerating without side effects Currently on omega-3 fatty acid for cholesterol management with prior LDL 83 (08/2019) -she plans on scheduling follow-up visit with PCP in the near future for repeat lipid panel Blood pressure today 128/74    No concerns at this time  Initial consult visit 04/22/2020 Dr. Lucia Gaskins (copy for reference purposes only)  Elfrieda Haffey is a 72 y.o. female here as requested by Pincus Sanes, MD for TIA versus BPPV vs complicated migraine(patient denies having a headache that day and NO history of migraines).  Patient was seen in the emergency room February 14, 2020, presenting for headache, photophobia, lightheadedness, "everything blinking", nausea vomiting, change speech pattern, code stroke was not called, she reported becoming acutely dizzy with nausea and vomiting that morning, also vision changes which she described as flashing blinking lights, she also reported a speech abnormality, code stroke was activated, initial CT and labs were negative, MRI was ordered with no acute abnormality, diagnosis was complex migraine versus peripheral vertigo.  She reported acute onset, there were no focal deficits in strength in the arms and legs, neurology was consulted, imaging was unremarkable, patient symptoms improved she was able to ambulate  and she was discharged home.   Patient is here and states that she had a debilitating episode, lasted for many hours, she went to the emergency room, included dizziness, vomiting, slurred abnormal speech, double vision, also 2 other episodes of bouts of amnesia 24 hours each time. She does not have a history of migraines. This was something new. She could not open her eyes due to vomiting, every time she opened her eyes she vomited, Slowly became worse, she was terrified, she thought she was dieing, they called emergency, she doesn't have much recollection of being in the ambulance. No headache. Started with dizziness and had chest heaviness, the dizziness and vertigo were non stop, not associated with movement, she denies any drug use or alcohol use. She has never had a migraine, she has no significant history of headaches or migraines. She denies any inciting event, no illnesses. She has  also had Transient [employer] Amnesia twice.   Reviewed notes, labs and imaging from outside physicians, which showed:  I reviewed MRI of the brain images February 14, 2020: Which appeared normal.  I reviewed CTA of the head and neck reports February 14, 2020: Which were also normal and negative for large vessel occlusion or significant atherosclerotic disease.  February 14, 2020: Rapid drug screen showed benzodiazepines and THC.  CMP showed elevated glucose otherwise normal.  Review of Systems: Patient complains of symptoms per HPI. Pertinent negatives and positives per HPI. All others negative.   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[employer]  Tobacco Use   Smoking status: Never   Smokeless tobacco: Never  Vaping Use   Vaping Use: Never used  Substance and Sexual Activity   Alcohol use: Yes    Alcohol/week: 4.0 standard drinks of alcohol    Types: 2 Glasses of wine, 2 Cans of beer per week    Comment: per week   Drug use: Never   Sexual activity: Not on file  Other Topics Concern   Not on file  Social History Narrative   Retired 07/10/17 from [employer]   No regular exercise      Lives at home with husband   Caffeine: 2 glasses/day diet dr pepper   Social Determinants of Health   Financial Resource Strain: Low Risk  (02/14/2022)   Overall Financial Resource Strain (CARDIA)    Difficulty of Paying Living Expenses: Not hard at all  Food Insecurity: No Food Insecurity (02/14/2022)   Hunger Vital Sign    Worried About Running Out of Food in the Last Year: Never true    Ran Out of Food in the Last Year: Never true  Transportation Needs: No Transportation Needs (02/14/2022)   PRAPARE - Administrator, Civil ServiceTransportation    Lack of Transportation (Medical): No    Lack of Transportation  (Non-Medical): No  Physical Activity: Insufficiently Active (02/14/2022)   Exercise Vital Sign    Days of Exercise per Week: 3 days    Minutes of Exercise per Session: 30 min  Stress: Stress Concern Present (02/14/2022)   [employer] - Occupational Stress Questionnaire    Feeling of Stress : To some extent  Social Connections: Socially Isolated (02/14/2022)   Social Connection and Isolation Panel [NHANES]    Frequency of  Communication with Friends and Family: More than three times a week    Frequency of Social Gatherings with Friends and Family: Twice a week    Attends Religious Services: Never    Database administrator or Organizations: No    Attends Banker Meetings: Never    Marital Status: Divorced  Catering manager Violence: Not At Risk (02/14/2022)   Humiliation, Afraid, Rape, and Kick questionnaire    Fear of Current or Ex-Partner: No    Emotionally Abused: No    Physically Abused: No    Sexually Abused: No    Family History  Problem Relation Age of Onset   Hypertension Mother    Heart disease Mother    Heart failure Mother    Hypertension Father    Ulcers Father    Gallstones Father    Kidney Stones Father    Hypertension Sister    Hypertension Brother    Heart attack Maternal Grandmother    Colon cancer Neg Hx    Stomach cancer Neg Hx    Esophageal cancer Neg Hx    Alzheimer's disease Neg Hx    Dementia Neg Hx     Past Medical History:  Diagnosis Date   Adjustment disorder with mixed anxiety and depressed mood    Arthritis    Depression    GERD (gastroesophageal reflux disease) 01/13/2017   Hemangioma 04/18/2017   History of breast augmentation    History of colon polyps 01/14/2017   Last colonoscopy 2018 - no polyps - advised 5 years   History of gastric bypass    History of pelvis fracture    in 30's   History of tuberculosis exposure 01/14/2017   Hx of latent TB, treated for 9 months   Hyperglycemia 09/28/2020    Hypertension    Hypothyroidism 01/14/2017   Melanocytic nevi, unspecified 04/18/2017   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   Obesity    Osteoporosis 01/14/2017   07/2018 - spine  -2.3,  RFN  -1.9,  LFN  -2.3   - improvement in hips dexa 07/25/17 - spine  -3.1,   RFN  -2.2,  LFN  -2.4  - started on prolia DEXA 05/13/2010 in [place], osteopenia   Spine: -2.3   LFN  -1.7   RFN:  -1.8     FRAX  15%, 1.9%    Paroxysmal atrial fibrillation 09/29/2020   PFO with atrial septal aneurysm 06/19/2020   Plant dermatitis 01/08/2020   Pneumonia    Purpura 04/18/2017   Rheumatoid arthritis 01/13/2017   Following at [facility] rheumatology  Has been on Prednisone, Plaquenil, Arava, Humira, etolodac, Enbrel, Orencia, Remicade and Rituxan     Sebaceous cyst 04/18/2017   TIA (transient ischemic attack) 04/22/2020   Ventral hernia without obstruction or gangrene 11/01/2019   Noted on Ct scan 10/2019   Vitamin D deficiency 06/22/2017    Patient Active Problem List   Diagnosis Date Noted   Confusional arousals 09/06/2022   Spells of decreased attentiveness 09/06/2022   Hyponatremia 07/15/2022   Elevated LFTs 07/15/2022   Acute sinus infection 06/06/2022   Confusion 05/02/2022   Preop examination 02/14/2022   Aortic atherosclerosis 01/20/2022   Right lower quadrant abdominal pain 12/22/2021   Constipation 12/22/2021   Blood in stool 12/22/2021   Alcohol abuse 09/20/2021   Paroxysmal atrial fibrillation 09/29/2020   Hyperglycemia 09/28/2020   PFO with atrial septal aneurysm s/p closure 06/19/2020   URI (upper respiratory infection) 06/02/2020   TIA (transient ischemic attack) 04/22/2020  Adjustment disorder with mixed anxiety and depressed mood    Ventral hernia without obstruction or gangrene 11/01/2019   Hypertension 08/30/2019   Vitamin D deficiency 06/22/2017   Hemangioma 04/18/2017   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   Purpura 04/18/2017   Hypothyroidism 01/14/2017   History of  tuberculosis exposure 01/14/2017   Osteoporosis - [facility], Dr Alene Mires 01/14/2017   History of gastric bypass 01/14/2017   History of colon polyps 01/14/2017   Rheumatoid arthritis - [facility], Dr Alene Mires 01/13/2017   GERD (gastroesophageal reflux disease) 01/13/2017    Past Surgical History:  Procedure Laterality Date   APPENDECTOMY     breast enhancement removal Bilateral 03/2022   BREAST ENHANCEMENT SURGERY     BUBBLE STUDY  05/27/2020   Procedure: BUBBLE STUDY;  Surgeon: Sande Rives, MD;  Location: [facility] ENDOSCOPY;  Service: Cardiovascular;;   GASTRIC BYPASS     PATENT FORAMEN OVALE(PFO) CLOSURE N/A 06/19/2020   Procedure: PATENT FORAMEN OVALE (PFO) CLOSURE;  Surgeon: Tonny Bollman, MD;  Location: [facility] INVASIVE CV LAB;  Service: Cardiovascular;  Laterality: N/A;   TEE WITHOUT CARDIOVERSION N/A 05/27/2020   Procedure: TRANSESOPHAGEAL ECHOCARDIOGRAM (TEE);  Surgeon: Sande Rives, MD;  Location: [facility] ENDOSCOPY;  Service: Cardiovascular;  Laterality: N/A;   TONSILLECTOMY      Current Outpatient Medications  Medication Sig Dispense Refill   amLODipine (NORVASC) 2.5 MG tablet TAKE 1 TABLET(2.5 MG) BY MOUTH DAILY 90 tablet 3   amoxicillin-clavulanate (AUGMENTIN) 875-125 MG tablet Take 1 tablet by mouth every 12 (twelve) hours. 14 tablet 0   apixaban (ELIQUIS) 5 MG TABS tablet Take 1 tablet (5 mg total) by mouth 2 (two) times daily. 180 tablet 1   Ascorbic Acid (VITAMIN C PO) Take 3,200 mg by mouth daily. 1600 mg each     benzonatate (TESSALON) 200 MG capsule Take 1 capsule (200 mg total) by mouth 3 (three) times daily as needed for up to 7 days. 40 capsule 0   Calcium Citrate-Vitamin D (CALCIUM + D PO) Take 1 capsule by mouth 1 day or 1 dose.     Cholecalciferol (VITAMIN D3) 250 MCG (10000 UT) TABS Take 5,000 Units by mouth 2 (two) times daily.      Coenzyme Q10 (COQ-10) 100 MG CAPS Take 200 mg by mouth daily.     fexofenadine (ALLEGRA ALLERGY) 180 MG tablet Take 1 tablet (180 mg  total) by mouth daily for 15 days. 15 tablet 0   Krill Oil 1000 MG CAPS Take 1,000 mg by mouth daily.     levothyroxine (SYNTHROID) 25 MCG tablet TAKE 1 TABLET(25 MCG) BY MOUTH DAILY 90 tablet 3   Misc Natural Products ([employer] GLUCOS-CHONDROIT-MSM TS) TABS Take 2 tablets by mouth at bedtime.     Multiple Vitamins-Minerals (CENTRUM SILVER PO) Take 1 tablet by mouth daily.      Omega-3 Fatty Acids (FISH OIL) 1000 MG CAPS Take 1,000 mg by mouth daily.     orlistat (ALLI) 60 MG capsule Take 60 mg by mouth 3 (three) times daily with meals.      OVER THE COUNTER MEDICATION Take 1,040 mg by mouth daily. Liposomal otc supplement     OZEMPIC, 2 MG/DOSE, 8 MG/3ML SOPN Inject 2 mg into the skin once a week.     Probiotic Product (PROBIOTIC PO) Take 1 capsule by mouth daily.     promethazine-dextromethorphan (PROMETHAZINE-DM) 6.25-15 MG/5ML syrup Take 5 mLs by mouth 2 (two) times daily as needed for cough. 118 mL 0  riTUXimab (RITUXAN) 500 MG/50ML injection Inject 10 mg into the vein every 6 (six) months.     triamcinolone ointment (KENALOG) 0.5 % Apply 1 Application topically 2 (two) times daily. (Patient taking differently: Apply 1 Application topically as needed.) 30 g 0   UNABLE TO FIND Take 2 capsules by mouth daily. Med Name: Monolaurin 936 mg and Zinc 15 mg     valACYclovir (VALTREX) 500 MG tablet TAKE 1 TABLET(500 MG) BY MOUTH DAILY 90 tablet 1   venlafaxine XR (EFFEXOR-XR) 150 MG 24 hr capsule TAKE 1 CAPSULE(150 MG) BY MOUTH DAILY WITH BREAKFAST 90 capsule 3   zinc gluconate 50 MG tablet Take 50 mg by mouth every Monday, Wednesday, and Friday.     No current facility-administered medications for this visit.   Facility-Administered Medications Ordered in Other Visits  Medication Dose Route Frequency Provider Last Rate Last Admin   sodium chloride flush (NS) 0.9 % injection 10 mL  10 mL Intravenous PRN Cline Crock R, PA-C   20 mL at 07/16/21 1025    Allergies as of 10/17/2022   (No Known  Allergies)    Vitals: BP 122/69   Pulse 75   Ht 5\' 4"  (1.626 m)   Wt 149 lb 6.4 oz (67.8 kg)   BMI 25.64 kg/m  Last Weight:  Wt Readings from Last 1 Encounters:  10/17/22 149 lb 6.4 oz (67.8 kg)   Last Height:   Ht Readings from Last 1 Encounters:  10/17/22 5\' 4"  (1.626 m)   Exam: NAD, pleasant                  Speech:    Speech is normal; fluent and spontaneous with normal comprehension.  Cognition:    The patient is oriented to person, place, and time;     recent and remote memory intact;     language fluent;    Cranial Nerves:    The pupils are equal, round, and reactive to light.Trigeminal sensation is intact and the muscles of mastication are normal. The face is symmetric. The palate elevates in the midline. Hearing intact. Voice is normal. Shoulder shrug is normal. The tongue has normal motion without fasciculations.   Coordination:  No dysmetria  Motor Observation:    No asymmetry, no atrophy, and no involuntary movements noted. Tone:    Normal muscle tone.     Strength:    Strength is V/V in the upper and lower limbs.      Sensation: intact to LT    Assessment/Plan:  episodes of confusion, Confusional arousals from sleep, not without sleep. Months apart. 5 times in 15 months. Last one was in February 2024.   EEG in January pending : was normal Order sleep doctor appointment to evaluate for an overnight sleep test with eeg montage ESS 14, morning headaches, excess daytime fatigue, morning confusion: she saw Dr. Vickey Huger 6 weeks sleep study 12/19/2022 14 day monitor for nocturnal afib or nocturnal abnormal heart rhythms : did not find any significant abnormal rhythms and the times you pressed the button did not correspond to any abnormalities on the monitor which is good news, "first degree av block" is generally benign  In the future after workup could also consider longer heart monitoring discuss with primary care MRI brain with seizure and stroke protocol:  mild age-related changes of small vessel disease and supratentorial cortical atrophy.  Pending sleep eval with EEG montage If negative admit into the hospital to EMU unit  Discuss at next appointment  repeat neurocog testing and other tests we now have available, will order labs Blood work today  TIA No additional TIA/stroke symptoms Continue Eliquis 5 mg twice daily and omega-3 for secondary stroke prevention Discussed secondary stroke prevention measures and importance of close PCP follow-up for aggressive stroke risk factor management including HTN with BP goal<130/90 and HLD with LDL goal<70   A. Fib:  Found on cardiac monitor 05/2020.   CHA2DS2-VASc score of 5 therefore cardiology Eliquis 5 mg twice daily which they continue to manage    PFO S/p closure 08/21/2019 by Dr. Excell Seltzer Continue aspirin 81 mg daily until 3/10 per cards (3 month post closure)     No orders of the defined types were placed in this encounter.    CC:  Pincus Sanes, MD   I spent over 40 minutes of face-to-face and non-face-to-face time with patient on the  No diagnosis found.  diagnosis.  This included previsit chart review, lab review, study review, order entry, electronic health record documentation, patient education on the different diagnostic and therapeutic options, counseling and coordination of care, risks and benefits of management, compliance, or risk factor reduction   [facility] [address] [place], [place] [address]  Phone [phone] Fax [phone] Note: This document was prepared with digital dictation and possible smart phrase technology. Any transcriptional errors that result from this process are unintentional.

## 2022-10-18 LAB — COMPREHENSIVE METABOLIC PANEL
AST: 55 IU/L — ABNORMAL HIGH (ref 0–40)
Albumin: 4.7 g/dL (ref 3.9–4.9)
CO2: 27 mmol/L (ref 20–29)
Calcium: 10.5 mg/dL — ABNORMAL HIGH (ref 8.7–10.3)
Glucose: 84 mg/dL (ref 70–99)
eGFR: 100 mL/min/{1.73_m2} (ref >59)

## 2022-10-18 LAB — ZINC

## 2022-10-18 LAB — CBC WITH DIFFERENTIAL/PLATELET
Basos: 1 %
Eos: 2 %
Hematocrit: 41.1 % (ref 34.0–46.6)
Hemoglobin: 13.9 g/dL (ref 11.1–15.9)
WBC: 6.6 10*3/uL (ref 3.4–10.8)

## 2022-10-18 LAB — HEMOGLOBIN A1C: Est. average glucose Bld gHb Est-mCnc: 103 mg/dL

## 2022-10-18 LAB — B12 AND FOLATE PANEL: Folate: 17.8 ng/mL (ref >3.0)

## 2022-10-19 ENCOUNTER — Telehealth: Payer: Self-pay | Admitting: Neurology

## 2022-10-19 ENCOUNTER — Encounter: Payer: Self-pay | Admitting: Neurology

## 2022-10-19 ENCOUNTER — Encounter: Payer: Self-pay | Admitting: Internal Medicine

## 2022-10-19 DIAGNOSIS — R7989 Other specified abnormal findings of blood chemistry: Secondary | ICD-10-CM

## 2022-10-19 LAB — HEMOGLOBIN A1C: Hgb A1c MFr Bld: 5.2 % (ref 4.8–5.6)

## 2022-10-19 LAB — THYROID PANEL WITH TSH
T4, Total: 5.1 ug/dL (ref 4.5–12.0)
TSH: 1.7 u[IU]/mL (ref 0.450–4.500)

## 2022-10-19 LAB — CBC WITH DIFFERENTIAL/PLATELET
Basophils Absolute: 0.1 10*3/uL (ref 0.0–0.2)
MCV: 97 fL (ref 79–97)
Monocytes: 15 %
Platelets: 353 10*3/uL (ref 150–450)

## 2022-10-19 LAB — HEAVY METALS, BLOOD
Arsenic: 3 ug/L (ref 0–9)
Lead, Blood: 2.4 ug/dL (ref 0.0–3.4)

## 2022-10-19 LAB — AMMONIA: Ammonia: 31 ug/dL — ABNORMAL LOW (ref 34–178)

## 2022-10-19 LAB — COMPREHENSIVE METABOLIC PANEL
Albumin/Globulin Ratio: 2 (ref 1.2–2.2)
Alkaline Phosphatase: 66 IU/L (ref 44–121)
BUN/Creatinine Ratio: 24 (ref 12–28)
BUN: 13 mg/dL (ref 8–27)

## 2022-10-19 NOTE — Telephone Encounter (Signed)
Call patient about her lab work, they do not have it all back yet but this is what we have so far:  Heavy metals normal Her liver functions were elevated slightly, can you ask about any alcohol or lots of Tylenol or other things that can cause liver issues; have noticed they were slightly elevated in the past as well. Hemoglobin A1c normal she is not diabetic B12 and folate look fine Thyroid normal Ammonia normal Zinc and copper pending and if we find any abnormalities we will give her a call.   I am not quite sure why your liver functions are elevated, I did let Dr. Lawerance Bach know and patient  should probably mention it to her at her next appointment. With ast>alt alcohol? Drugs like tylenol? Fatty liver? Will have to kick that back to dr burns sorry.  Thanks.

## 2022-10-19 NOTE — Telephone Encounter (Signed)
I called patient. She is busy right now and will call back later. Please route to POD 4 when she calls back.

## 2022-10-19 NOTE — Telephone Encounter (Signed)
Patient returned my call.  I discussed her lab work results and recommendations.  Patient will follow-up with Dr. Lawerance Bach to discuss the elevated AST and ALT.  She reports having 2 to 3 glasses of wine on weekends.  Otherwise, no excessive Tylenol use or alcohol use.  I advised her we will call her if the rest of her lab work comes back abnormal.  Patient verbalized understanding of results.  Patient had no further questions or concerns at this time.

## 2022-10-22 LAB — CBC WITH DIFFERENTIAL/PLATELET
EOS (ABSOLUTE): 0.1 10*3/uL (ref 0.0–0.4)
Immature Grans (Abs): 0 10*3/uL (ref 0.0–0.1)
Immature Granulocytes: 0 %
Lymphocytes Absolute: 1.2 10*3/uL (ref 0.7–3.1)
Lymphs: 18 %
MCH: 32.8 pg (ref 26.6–33.0)
MCHC: 33.8 g/dL (ref 31.5–35.7)
Monocytes Absolute: 1 10*3/uL — ABNORMAL HIGH (ref 0.1–0.9)
Neutrophils Absolute: 4.2 10*3/uL (ref 1.4–7.0)
Neutrophils: 64 %
RBC: 4.24 x10E6/uL (ref 3.77–5.28)
RDW: 12 % (ref 11.7–15.4)

## 2022-10-22 LAB — COMPREHENSIVE METABOLIC PANEL
ALT: 44 IU/L — ABNORMAL HIGH (ref 0–32)
Bilirubin Total: 0.3 mg/dL (ref 0.0–1.2)
Chloride: 95 mmol/L — ABNORMAL LOW (ref 96–106)
Creatinine, Ser: 0.54 mg/dL — ABNORMAL LOW (ref 0.57–1.00)
Globulin, Total: 2.3 g/dL (ref 1.5–4.5)
Potassium: 4.5 mmol/L (ref 3.5–5.2)
Sodium: 138 mmol/L (ref 134–144)
Total Protein: 7 g/dL (ref 6.0–8.5)

## 2022-10-22 LAB — METHYLMALONIC ACID, SERUM: Methylmalonic Acid: 113 nmol/L (ref 0–378)

## 2022-10-22 LAB — THYROID PANEL WITH TSH
Free Thyroxine Index: 1.4 (ref 1.2–4.9)
T3 Uptake Ratio: 28 % (ref 24–39)

## 2022-10-22 LAB — HEAVY METALS, BLOOD: Mercury: <1 ug/L (ref 0.0–14.9)

## 2022-10-22 LAB — COPPER, SERUM: Copper: 110 ug/dL (ref 80–158)

## 2022-10-22 LAB — B12 AND FOLATE PANEL: Vitamin B-12: >2000 pg/mL — ABNORMAL HIGH (ref 232–1245)

## 2022-10-24 DIAGNOSIS — H353131 Nonexudative age-related macular degeneration, bilateral, early dry stage: Secondary | ICD-10-CM | POA: Diagnosis not present

## 2022-10-24 DIAGNOSIS — H40033 Anatomical narrow angle, bilateral: Secondary | ICD-10-CM | POA: Diagnosis not present

## 2022-10-24 DIAGNOSIS — H25813 Combined forms of age-related cataract, bilateral: Secondary | ICD-10-CM | POA: Diagnosis not present

## 2022-10-24 DIAGNOSIS — H40013 Open angle with borderline findings, low risk, bilateral: Secondary | ICD-10-CM | POA: Diagnosis not present

## 2022-10-25 ENCOUNTER — Other Ambulatory Visit (INDEPENDENT_AMBULATORY_CARE_PROVIDER_SITE_OTHER): Payer: Medicare Other

## 2022-10-25 DIAGNOSIS — R7989 Other specified abnormal findings of blood chemistry: Secondary | ICD-10-CM | POA: Diagnosis not present

## 2022-10-25 LAB — HEPATIC FUNCTION PANEL
ALT: 41 U/L — ABNORMAL HIGH (ref 0–35)
AST: 38 U/L — ABNORMAL HIGH (ref 0–37)
Albumin: 4.3 g/dL (ref 3.5–5.2)
Alkaline Phosphatase: 52 U/L (ref 39–117)
Bilirubin, Direct: 0.1 mg/dL (ref 0.0–0.3)
Total Bilirubin: 0.3 mg/dL (ref 0.2–1.2)
Total Protein: 6.8 g/dL (ref 6.0–8.3)

## 2022-10-27 LAB — PTH, INTACT AND CALCIUM
Calcium: 9.9 mg/dL (ref 8.6–10.4)
PTH: 25 pg/mL (ref 16–77)

## 2022-11-07 ENCOUNTER — Encounter: Payer: Self-pay | Admitting: Internal Medicine

## 2022-11-07 NOTE — Progress Notes (Unsigned)
Subjective:    Patient ID: Julia [name], female    DOB: 04-05-1951, 72 y.o.   MRN: [id number]      HPI Julia [name] is here for No chief complaint on file.   Sinus issues -   steatorrhea Oily stool - malabsor, pancreastit, alcohol, gallstone pancrea,      Medications and allergies reviewed with patient and updated if appropriate.  Current Outpatient Medications on File Prior to Visit  Medication Sig Dispense Refill   amLODipine (NORVASC) 2.5 MG tablet TAKE 1 TABLET(2.5 MG) BY MOUTH DAILY 90 tablet 3   amoxicillin-clavulanate (AUGMENTIN) 875-125 MG tablet Take 1 tablet by mouth every 12 (twelve) hours. 14 tablet 0   apixaban (ELIQUIS) 5 MG TABS tablet Take 1 tablet (5 mg total) by mouth 2 (two) times daily. 180 tablet 1   Ascorbic Acid (VITAMIN C PO) Take 3,200 mg by mouth daily. 1600 mg each     Calcium Citrate-Vitamin D (CALCIUM + D PO) Take 1 capsule by mouth 1 day or 1 dose.     Cholecalciferol (VITAMIN D3) 250 MCG (10000 UT) TABS Take 5,000 Units by mouth 2 (two) times daily.      Coenzyme Q10 (COQ-10) 100 MG CAPS Take 200 mg by mouth daily.     fexofenadine (ALLEGRA ALLERGY) 180 MG tablet Take 1 tablet (180 mg total) by mouth daily for 15 days. 15 tablet 0   Krill Oil 1000 MG CAPS Take 1,000 mg by mouth daily.     levothyroxine (SYNTHROID) 25 MCG tablet TAKE 1 TABLET(25 MCG) BY MOUTH DAILY 90 tablet 3   Misc Natural Products ([employer] GLUCOS-CHONDROIT-MSM TS) TABS Take 2 tablets by mouth at bedtime.     Multiple Vitamins-Minerals (CENTRUM SILVER PO) Take 1 tablet by mouth daily.      Omega-3 Fatty Acids (FISH OIL) 1000 MG CAPS Take 1,000 mg by mouth daily.     orlistat (ALLI) 60 MG capsule Take 60 mg by mouth 3 (three) times daily with meals.      OVER THE COUNTER MEDICATION Take 1,040 mg by mouth daily. Liposomal otc supplement     OZEMPIC, 2 MG/DOSE, 8 MG/3ML SOPN Inject 2 mg into the skin once a week.     Probiotic Product (PROBIOTIC PO) Take 1 capsule by mouth daily.      promethazine-dextromethorphan (PROMETHAZINE-DM) 6.25-15 MG/5ML syrup Take 5 mLs by mouth 2 (two) times daily as needed for cough. 118 mL 0   riTUXimab (RITUXAN) 500 MG/50ML injection Inject 10 mg into the vein every 6 (six) months.     triamcinolone ointment (KENALOG) 0.5 % Apply 1 Application topically 2 (two) times daily. (Patient taking differently: Apply 1 Application topically as needed.) 30 g 0   UNABLE TO FIND Take 2 capsules by mouth daily. Med Name: Monolaurin 936 mg and Zinc 15 mg     valACYclovir (VALTREX) 500 MG tablet TAKE 1 TABLET(500 MG) BY MOUTH DAILY 90 tablet 1   venlafaxine XR (EFFEXOR-XR) 150 MG 24 hr capsule TAKE 1 CAPSULE(150 MG) BY MOUTH DAILY WITH BREAKFAST 90 capsule 3   zinc gluconate 50 MG tablet Take 50 mg by mouth every Monday, Wednesday, and Friday.     Current Facility-Administered Medications on File Prior to Visit  Medication Dose Route Frequency Provider Last Rate Last Admin   sodium chloride flush (NS) 0.9 % injection 10 mL  10 mL Intravenous PRN Janetta Hora, PA-C   20 mL at 07/16/21 1025    Review of Systems  Objective:  There were no vitals filed for this visit. BP Readings from Last 3 Encounters:  10/17/22 122/69  10/15/22 118/79  09/06/22 119/81   Wt Readings from Last 3 Encounters:  10/17/22 149 lb 6.4 oz (67.8 kg)  10/15/22 142 lb (64.4 kg)  09/06/22 146 lb (66.2 kg)   There is no height or weight on file to calculate BMI.    Physical Exam         Assessment & Plan:    See Problem List for Assessment and Plan of chronic medical problems.

## 2022-11-07 NOTE — Patient Instructions (Signed)
      Blood work was ordered.   The lab is on the first floor.    Medications changes include :   none    A referral was ordered for GI .     Someone will call you to schedule an appointment.   You can also call them Phone: [phone]    Return if symptoms worsen or fail to improve.

## 2022-11-08 ENCOUNTER — Ambulatory Visit (INDEPENDENT_AMBULATORY_CARE_PROVIDER_SITE_OTHER): Payer: Medicare Other | Admitting: Internal Medicine

## 2022-11-08 VITALS — BP 124/71 | HR 80 | Temp 98.2°F | Ht 64.0 in | Wt 147.0 lb

## 2022-11-08 DIAGNOSIS — K909 Intestinal malabsorption, unspecified: Secondary | ICD-10-CM

## 2022-11-08 DIAGNOSIS — R0982 Postnasal drip: Secondary | ICD-10-CM

## 2022-11-08 DIAGNOSIS — E039 Hypothyroidism, unspecified: Secondary | ICD-10-CM

## 2022-11-08 DIAGNOSIS — I10 Essential (primary) hypertension: Secondary | ICD-10-CM

## 2022-11-08 DIAGNOSIS — E559 Vitamin D deficiency, unspecified: Secondary | ICD-10-CM

## 2022-11-08 HISTORY — DX: Postnasal drip: R09.82

## 2022-11-08 HISTORY — DX: Intestinal malabsorption, unspecified: K90.9

## 2022-11-08 LAB — CBC WITH DIFFERENTIAL/PLATELET
Basophils Absolute: 0.1 10*3/uL (ref 0.0–0.1)
Basophils Relative: 1.2 % (ref 0.0–3.0)
Eosinophils Absolute: 0.2 10*3/uL (ref 0.0–0.7)
Eosinophils Relative: 2.6 % (ref 0.0–5.0)
HCT: 36.8 % (ref 36.0–46.0)
Hemoglobin: 12.4 g/dL (ref 12.0–15.0)
Lymphocytes Relative: 20.8 % (ref 12.0–46.0)
Lymphs Abs: 1.2 10*3/uL (ref 0.7–4.0)
MCHC: 33.6 g/dL (ref 30.0–36.0)
MCV: 97.3 fl (ref 78.0–100.0)
Monocytes Absolute: 0.8 10*3/uL (ref 0.1–1.0)
Monocytes Relative: 14.2 % — ABNORMAL HIGH (ref 3.0–12.0)
Neutro Abs: 3.6 10*3/uL (ref 1.4–7.7)
Neutrophils Relative %: 61.2 % (ref 43.0–77.0)
Platelets: 308 10*3/uL (ref 150.0–400.0)
RBC: 3.78 Mil/uL — ABNORMAL LOW (ref 3.87–5.11)
RDW: 12.6 % (ref 11.5–15.5)
WBC: 5.9 10*3/uL (ref 4.0–10.5)

## 2022-11-08 LAB — AMYLASE: Amylase: 21 U/L — ABNORMAL LOW (ref 27–131)

## 2022-11-08 LAB — COMPREHENSIVE METABOLIC PANEL
ALT: 40 U/L — ABNORMAL HIGH (ref 0–35)
AST: 41 U/L — ABNORMAL HIGH (ref 0–37)
Albumin: 4.3 g/dL (ref 3.5–5.2)
Alkaline Phosphatase: 52 U/L (ref 39–117)
BUN: 16 mg/dL (ref 6–23)
CO2: 30 mEq/L (ref 19–32)
Calcium: 9.2 mg/dL (ref 8.4–10.5)
Chloride: 97 mEq/L (ref 96–112)
Creatinine, Ser: 0.51 mg/dL (ref 0.40–1.20)
GFR: 95.16 mL/min (ref >60.00)
Glucose, Bld: 82 mg/dL (ref 70–99)
Potassium: 3.6 mEq/L (ref 3.5–5.1)
Sodium: 135 mEq/L (ref 135–145)
Total Bilirubin: 0.5 mg/dL (ref 0.2–1.2)
Total Protein: 6.5 g/dL (ref 6.0–8.3)

## 2022-11-08 LAB — TSH: TSH: 0.79 u[IU]/mL (ref 0.35–5.50)

## 2022-11-08 LAB — VITAMIN D 25 HYDROXY (VIT D DEFICIENCY, FRACTURES): VITD: 51.28 ng/mL (ref 30.00–100.00)

## 2022-11-08 LAB — LIPASE: Lipase: 31 U/L (ref 11.0–59.0)

## 2022-11-08 NOTE — Assessment & Plan Note (Signed)
Chronic Check TSH Clinically euthyroid Continue taking levothyroxine  25 mcg daily

## 2022-11-08 NOTE — Assessment & Plan Note (Signed)
Chronic Blood pressure is well-controlled Continue amlodipine 2.5 mg daily 

## 2022-11-08 NOTE — Assessment & Plan Note (Signed)
Subacute Persistent since sinus infection Has mucus accumulating in her throat and is having difficulty clearing it No symptoms to suggest active infection so no antibiotic needed at this time Continue symptomatic treatment-nasal spray Can try some natural supplements to see if that helps clear some of the mucus

## 2022-11-08 NOTE — Assessment & Plan Note (Addendum)
Acute Started about 1 month ago and has persisted Having oily stools that are loose No other symptoms such as abdominal pain, blood in the stool/melena, nausea When he first started she was taking the statin and I advised that she stop this-no change in symptoms History of gastric bypass years ago-baseline stool is soft On Ozempic 2 mg weekly which has been stable Will check CBC, CMP-does have elevated LFTs-at 1 point she was drinking too much wine, but has decreased which she is drinking at this point ?  Pancreatic insufficiency Amylase, lipase, stool elastase Has ultrasound of liver ordered-to be done next week Will refer to GI

## 2022-11-08 NOTE — Assessment & Plan Note (Signed)
Chronic Taking vitamin D Given recent malabsorption will check vitamin D level

## 2022-11-09 DIAGNOSIS — K909 Intestinal malabsorption, unspecified: Secondary | ICD-10-CM | POA: Diagnosis not present

## 2022-11-13 ENCOUNTER — Ambulatory Visit (INDEPENDENT_AMBULATORY_CARE_PROVIDER_SITE_OTHER): Payer: Medicare Other | Admitting: Neurology

## 2022-11-13 DIAGNOSIS — G4751 Confusional arousals: Secondary | ICD-10-CM | POA: Diagnosis not present

## 2022-11-13 DIAGNOSIS — R404 Transient alteration of awareness: Secondary | ICD-10-CM

## 2022-11-13 DIAGNOSIS — F4323 Adjustment disorder with mixed anxiety and depressed mood: Secondary | ICD-10-CM

## 2022-11-13 DIAGNOSIS — R6889 Other general symptoms and signs: Secondary | ICD-10-CM

## 2022-11-13 DIAGNOSIS — Q2112 Patent foramen ovale: Secondary | ICD-10-CM

## 2022-11-15 ENCOUNTER — Ambulatory Visit
Admission: RE | Admit: 2022-11-15 | Discharge: 2022-11-15 | Disposition: A | Discharge status: Home / Self Care | Payer: Medicare Other | Source: Ambulatory Visit | Attending: Internal Medicine | Admitting: Internal Medicine

## 2022-11-15 DIAGNOSIS — R7989 Other specified abnormal findings of blood chemistry: Secondary | ICD-10-CM | POA: Diagnosis not present

## 2022-11-15 LAB — PANCREATIC ELASTASE, FECAL: Pancreatic Elastase-1, Stool: 310 mcg/g

## 2022-11-25 DIAGNOSIS — R404 Transient alteration of awareness: Secondary | ICD-10-CM | POA: Insufficient documentation
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
## 2022-11-26 ENCOUNTER — Encounter: Payer: Self-pay | Admitting: Internal Medicine

## 2022-11-26 ENCOUNTER — Encounter: Payer: Self-pay | Admitting: Neurology

## 2022-11-30 DIAGNOSIS — M0609 Rheumatoid arthritis without rheumatoid factor, multiple sites: Secondary | ICD-10-CM | POA: Diagnosis not present

## 2022-12-23 ENCOUNTER — Telehealth: Payer: Self-pay | Admitting: Internal Medicine

## 2022-12-23 DIAGNOSIS — R7989 Other specified abnormal findings of blood chemistry: Secondary | ICD-10-CM

## 2022-12-23 DIAGNOSIS — E871 Hypo-osmolality and hyponatremia: Secondary | ICD-10-CM

## 2022-12-23 DIAGNOSIS — E559 Vitamin D deficiency, unspecified: Secondary | ICD-10-CM

## 2022-12-23 DIAGNOSIS — I480 Paroxysmal atrial fibrillation: Secondary | ICD-10-CM

## 2022-12-23 DIAGNOSIS — R739 Hyperglycemia, unspecified: Secondary | ICD-10-CM

## 2022-12-23 DIAGNOSIS — E039 Hypothyroidism, unspecified: Secondary | ICD-10-CM

## 2022-12-23 NOTE — Telephone Encounter (Signed)
Pt has PCP appt 7/8 and lab appt 7/1.  Please enter lab orders for this visit.

## 2022-12-26 ENCOUNTER — Other Ambulatory Visit: Payer: Self-pay | Admitting: Internal Medicine

## 2023-01-09 ENCOUNTER — Other Ambulatory Visit: Payer: Medicare Other

## 2023-01-09 ENCOUNTER — Other Ambulatory Visit (INDEPENDENT_AMBULATORY_CARE_PROVIDER_SITE_OTHER): Payer: Medicare Other

## 2023-01-09 ENCOUNTER — Telehealth: Payer: Self-pay | Admitting: Internal Medicine

## 2023-01-09 DIAGNOSIS — E871 Hypo-osmolality and hyponatremia: Secondary | ICD-10-CM | POA: Diagnosis not present

## 2023-01-09 DIAGNOSIS — E039 Hypothyroidism, unspecified: Secondary | ICD-10-CM | POA: Diagnosis not present

## 2023-01-09 DIAGNOSIS — R739 Hyperglycemia, unspecified: Secondary | ICD-10-CM

## 2023-01-09 DIAGNOSIS — R7989 Other specified abnormal findings of blood chemistry: Secondary | ICD-10-CM

## 2023-01-09 DIAGNOSIS — I480 Paroxysmal atrial fibrillation: Secondary | ICD-10-CM

## 2023-01-09 LAB — COMPREHENSIVE METABOLIC PANEL
ALT: 23 U/L (ref 0–35)
AST: 32 U/L (ref 0–37)
Albumin: 4.8 g/dL (ref 3.5–5.2)
Alkaline Phosphatase: 60 U/L (ref 39–117)
BUN: 12 mg/dL (ref 6–23)
CO2: 31 mEq/L (ref 19–32)
Calcium: 10.8 mg/dL — ABNORMAL HIGH (ref 8.4–10.5)
Chloride: 95 mEq/L — ABNORMAL LOW (ref 96–112)
Creatinine, Ser: 0.53 mg/dL (ref 0.40–1.20)
GFR: 94.17 mL/min (ref >60.00)
Glucose, Bld: 84 mg/dL (ref 70–99)
Potassium: 4.4 mEq/L (ref 3.5–5.1)
Sodium: 133 mEq/L — ABNORMAL LOW (ref 135–145)
Total Bilirubin: 0.5 mg/dL (ref 0.2–1.2)
Total Protein: 7.4 g/dL (ref 6.0–8.3)

## 2023-01-09 LAB — CBC WITH DIFFERENTIAL/PLATELET
Basophils Absolute: 0.1 10*3/uL (ref 0.0–0.1)
Basophils Relative: 1.8 % (ref 0.0–3.0)
Eosinophils Absolute: 0.1 10*3/uL (ref 0.0–0.7)
Eosinophils Relative: 2 % (ref 0.0–5.0)
HCT: 40.6 % (ref 36.0–46.0)
Hemoglobin: 13.3 g/dL (ref 12.0–15.0)
Lymphocytes Relative: 19.3 % (ref 12.0–46.0)
Lymphs Abs: 0.8 10*3/uL (ref 0.7–4.0)
MCHC: 32.7 g/dL (ref 30.0–36.0)
MCV: 97.8 fl (ref 78.0–100.0)
Monocytes Absolute: 0.7 10*3/uL (ref 0.1–1.0)
Monocytes Relative: 16.2 % — ABNORMAL HIGH (ref 3.0–12.0)
Neutro Abs: 2.4 10*3/uL (ref 1.4–7.7)
Neutrophils Relative %: 60.7 % (ref 43.0–77.0)
Platelets: 319 10*3/uL (ref 150.0–400.0)
RBC: 4.15 Mil/uL (ref 3.87–5.11)
RDW: 13.1 % (ref 11.5–15.5)
WBC: 4 10*3/uL (ref 4.0–10.5)

## 2023-01-09 LAB — LIPID PANEL
Cholesterol: 201 mg/dL — ABNORMAL HIGH (ref 0–200)
HDL: 117.8 mg/dL (ref >39.00)
LDL Cholesterol: 72 mg/dL (ref 0–99)
NonHDL: 83.13
Total CHOL/HDL Ratio: 2
Triglycerides: 56 mg/dL (ref 0.0–149.0)
VLDL: 11.2 mg/dL (ref 0.0–40.0)

## 2023-01-09 LAB — TSH: TSH: 1.62 u[IU]/mL (ref 0.35–5.50)

## 2023-01-09 LAB — HEMOGLOBIN A1C: Hgb A1c MFr Bld: 4.9 % (ref 4.6–6.5)
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
## 2023-01-15 ENCOUNTER — Encounter: Payer: Self-pay | Admitting: Internal Medicine

## 2023-01-15 NOTE — Patient Instructions (Addendum)
     Medications changes include :   none      Return in about 6 months (around 07/19/2023) for follow up.

## 2023-01-15 NOTE — Progress Notes (Unsigned)
Subjective:    Patient ID: Julia [name], female    DOB: 04/27/1951, 72 y.o.   MRN: [id number]     HPI [name] is here for follow up of her chronic medical problems.  Doing a trainer 3/week.  Drinking a lot of protein daily.    No confusion episodes since early Feb.    Medications and allergies reviewed with patient and updated if appropriate.  Current Outpatient Medications on File Prior to Visit  Medication Sig Dispense Refill   amLODipine (NORVASC) 2.5 MG tablet TAKE 1 TABLET(2.5 MG) BY MOUTH DAILY 90 tablet 3   apixaban (ELIQUIS) 5 MG TABS tablet Take 1 tablet (5 mg total) by mouth 2 (two) times daily. 180 tablet 1   Ascorbic Acid (VITAMIN C PO) Take 3,200 mg by mouth daily. 1600 mg each     Calcium Citrate-Vitamin D (CALCIUM + D PO) Take 1 capsule by mouth 1 day or 1 dose.     Cholecalciferol (VITAMIN D3) 250 MCG (10000 UT) TABS Take 5,000 Units by mouth 2 (two) times daily.      Coenzyme Q10 (COQ-10) 100 MG CAPS Take 200 mg by mouth daily.     Krill Oil 1000 MG CAPS Take 1,000 mg by mouth daily.     levothyroxine (SYNTHROID) 25 MCG tablet TAKE 1 TABLET(25 MCG) BY MOUTH DAILY 90 tablet 3   Misc Natural Products ([employer] GLUCOS-CHONDROIT-MSM TS) TABS Take 2 tablets by mouth at bedtime.     Multiple Vitamins-Minerals (CENTRUM SILVER PO) Take 1 tablet by mouth daily.      OZEMPIC, 2 MG/DOSE, 8 MG/3ML SOPN Inject 2 mg into the skin once a week.     riTUXimab (RITUXAN) 500 MG/50ML injection Inject 10 mg into the vein every 6 (six) months.     triamcinolone ointment (KENALOG) 0.5 % APPLY TOPICALLY TO THE AFFECTED AREA TWICE DAILY 30 g 0   valACYclovir (VALTREX) 500 MG tablet TAKE 1 TABLET(500 MG) BY MOUTH DAILY 90 tablet 1   venlafaxine XR (EFFEXOR-XR) 150 MG 24 hr capsule TAKE 1 CAPSULE(150 MG) BY MOUTH DAILY WITH BREAKFAST 90 capsule 3   Current Facility-Administered Medications on File Prior to Visit  Medication Dose Route Frequency Provider Last Rate Last Admin   sodium  chloride flush (NS) 0.9 % injection 10 mL  10 mL Intravenous PRN Cline Crock R, PA-C   20 mL at 07/16/21 1025     Review of Systems  Constitutional:  Negative for fever.  HENT:  Positive for congestion and postnasal drip.   Respiratory:  Negative for cough, shortness of breath and wheezing.   Cardiovascular:  Negative for chest pain, palpitations and leg swelling.  Neurological:  Negative for light-headedness and headaches.       Objective:   Vitals:   01/16/23 1102  BP: 130/72  Pulse: 72  Temp: 98.2 F (36.8 C)  SpO2: 98%   BP Readings from Last 3 Encounters:  01/16/23 130/72  11/08/22 124/71  10/17/22 122/69   Wt Readings from Last 3 Encounters:  01/16/23 140 lb (63.5 kg)  11/08/22 147 lb (66.7 kg)  10/17/22 149 lb 6.4 oz (67.8 kg)   Body mass index is 24.03 kg/m.    Physical Exam Constitutional:      General: She is not in acute distress.    Appearance: Normal appearance.  HENT:     Head: Normocephalic and atraumatic.  Eyes:     Conjunctiva/sclera: Conjunctivae normal.  Cardiovascular:     Rate  and Rhythm: Normal rate and regular rhythm.     Heart sounds: Normal heart sounds.  Pulmonary:     Effort: Pulmonary effort is normal. No respiratory distress.     Breath sounds: Normal breath sounds. No wheezing.  Musculoskeletal:     Cervical back: Neck supple.     Right lower leg: No edema.     Left lower leg: No edema.  Lymphadenopathy:     Cervical: No cervical adenopathy.  Skin:    General: Skin is warm and dry.     Findings: No rash.  Neurological:     Mental Status: She is alert. Mental status is at baseline.  Psychiatric:        Mood and Affect: Mood normal.        Behavior: Behavior normal.        Lab Results  Component Value Date   WBC 4.0 01/09/2023   HGB 13.3 01/09/2023   HCT 40.6 01/09/2023   PLT 319.0 01/09/2023   GLUCOSE 84 01/09/2023   CHOL 201 (H) 01/09/2023   TRIG 56.0 01/09/2023   HDL 117.80 01/09/2023   LDLCALC 72  01/09/2023   ALT 23 01/09/2023   AST 32 01/09/2023   NA 133 (L) 01/09/2023   K 4.4 01/09/2023   CL 95 (L) 01/09/2023   CREATININE 0.53 01/09/2023   BUN 12 01/09/2023   CO2 31 01/09/2023   TSH 1.62 01/09/2023   INR 1.0 02/14/2020   HGBA1C 4.9 01/09/2023     Assessment & Plan:    See Problem List for Assessment and Plan of chronic medical problems.

## 2023-01-16 ENCOUNTER — Ambulatory Visit (INDEPENDENT_AMBULATORY_CARE_PROVIDER_SITE_OTHER): Payer: Medicare Other | Admitting: Internal Medicine

## 2023-01-16 VITALS — BP 130/72 | HR 72 | Temp 98.2°F | Ht 64.0 in | Wt 140.0 lb

## 2023-01-16 DIAGNOSIS — F4323 Adjustment disorder with mixed anxiety and depressed mood: Secondary | ICD-10-CM | POA: Diagnosis not present

## 2023-01-16 DIAGNOSIS — I10 Essential (primary) hypertension: Secondary | ICD-10-CM | POA: Diagnosis not present

## 2023-01-16 DIAGNOSIS — R7989 Other specified abnormal findings of blood chemistry: Secondary | ICD-10-CM | POA: Diagnosis not present

## 2023-01-16 DIAGNOSIS — E039 Hypothyroidism, unspecified: Secondary | ICD-10-CM | POA: Diagnosis not present

## 2023-01-16 NOTE — Assessment & Plan Note (Signed)
Chronic Lab Results  Component Value Date   TSH 1.62 01/09/2023   Clinically euthyroid TSH in normal range Continue taking levothyroxine  25 mcg daily

## 2023-01-16 NOTE — Assessment & Plan Note (Signed)
Chronic Blood pressure is well-controlled CMP reviewed -GFR normal Continue amlodipine 2.5 mg daily

## 2023-01-16 NOTE — Assessment & Plan Note (Signed)
Transient Most recent LFTs were normal Likely circumstantial Will monitor, but since normal no further evaluation needed

## 2023-01-16 NOTE — Assessment & Plan Note (Signed)
Chronic Controlled Continue Effexor 150 mg daily Seeing a therapist

## 2023-01-17 NOTE — Telephone Encounter (Signed)
Lmovm for pt to return call.  
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
## 2023-01-19 ENCOUNTER — Telehealth: Payer: Self-pay | Admitting: Internal Medicine

## 2023-01-19 NOTE — Telephone Encounter (Signed)
Per chart cpx labs was done on 01/09/23. Pt is out of the office until 02/06/23 Pt has Medicare and has to see MD first../lb

## 2023-01-19 NOTE — Telephone Encounter (Signed)
Patient scheduled an appointment for 07/24/2023. She would like to know if lab orders can be put in for her to have them done a week before then. Best callback is [phone].

## 2023-01-21 ENCOUNTER — Other Ambulatory Visit: Payer: Self-pay | Admitting: Internal Medicine

## 2023-01-26 ENCOUNTER — Ambulatory Visit
Admission: EM | Admit: 2023-01-26 | Discharge: 2023-01-26 | Disposition: A | Discharge status: Home / Self Care | Payer: Medicare Other | Attending: Family Medicine | Admitting: Family Medicine

## 2023-01-26 DIAGNOSIS — H6123 Impacted cerumen, bilateral: Secondary | ICD-10-CM | POA: Diagnosis not present

## 2023-01-26 DIAGNOSIS — Z7901 Long term (current) use of anticoagulants: Secondary | ICD-10-CM | POA: Diagnosis not present

## 2023-01-26 DIAGNOSIS — G3189 Other specified degenerative diseases of nervous system: Secondary | ICD-10-CM | POA: Diagnosis not present

## 2023-01-26 DIAGNOSIS — S0990XA Unspecified injury of head, initial encounter: Secondary | ICD-10-CM | POA: Diagnosis not present

## 2023-01-26 DIAGNOSIS — S79912A Unspecified injury of left hip, initial encounter: Secondary | ICD-10-CM | POA: Diagnosis not present

## 2023-01-26 DIAGNOSIS — W108XXA Fall (on) (from) other stairs and steps, initial encounter: Secondary | ICD-10-CM | POA: Diagnosis not present

## 2023-01-26 DIAGNOSIS — H6122 Impacted cerumen, left ear: Secondary | ICD-10-CM | POA: Diagnosis not present

## 2023-01-26 DIAGNOSIS — S0011XA Contusion of right eyelid and periocular area, initial encounter: Secondary | ICD-10-CM | POA: Diagnosis not present

## 2023-01-26 DIAGNOSIS — S0083XA Contusion of other part of head, initial encounter: Secondary | ICD-10-CM | POA: Diagnosis not present

## 2023-01-26 DIAGNOSIS — Z043 Encounter for examination and observation following other accident: Secondary | ICD-10-CM | POA: Diagnosis not present

## 2023-01-26 DIAGNOSIS — S199XXA Unspecified injury of neck, initial encounter: Secondary | ICD-10-CM | POA: Diagnosis not present

## 2023-01-26 DIAGNOSIS — Z7982 Long term (current) use of aspirin: Secondary | ICD-10-CM | POA: Diagnosis not present

## 2023-01-26 DIAGNOSIS — I6789 Other cerebrovascular disease: Secondary | ICD-10-CM | POA: Diagnosis not present

## 2023-01-26 DIAGNOSIS — M5030 Other cervical disc degeneration, unspecified cervical region: Secondary | ICD-10-CM | POA: Diagnosis not present

## 2023-01-26 DIAGNOSIS — S7012XA Contusion of left thigh, initial encounter: Secondary | ICD-10-CM | POA: Diagnosis not present

## 2023-01-26 NOTE — Discharge Instructions (Signed)
Please proceed to the ER for further evaluation
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
## 2023-01-26 NOTE — ED Provider Notes (Addendum)
[facility]    CSN: [id number] Arrival date & time: 01/26/23  1441      History   Chief Complaint Chief Complaint  Patient presents with   Head Injury    HPI Julia [name] is a 72 y.o. female.    Head Injury Here for head injury on 7/14. She tripped going up stairs while carrying groceries, and stumbled and hit her head against the stair railing. No LOC and no other injury.   She had swelling and bruising over her right temple right after. She felt off the first 2 days, but had not experienced any diplopia, focal motor or sensory deficit or other visual disturbance.  She does take eliquis. She did not seek medical care until now.   She comes in today because she notes purple discoloration of her upper right eyelid today, which is new. The hematoma has flattened some, though it is still there, and the ecchymosis of the right temple is greenish.  No f/c and no n/v.  Past Medical History:  Diagnosis Date   Adjustment disorder with mixed anxiety and depressed mood    Arthritis    Depression    GERD (gastroesophageal reflux disease) 01/13/2017   Hemangioma 04/18/2017   History of breast augmentation    History of colon polyps 01/14/2017   Last colonoscopy 2018 - no polyps - advised 5 years   History of gastric bypass    History of pelvis fracture    in 30's   History of tuberculosis exposure 01/14/2017   Hx of latent TB, treated for 9 months   Hyperglycemia 09/28/2020   Hypertension    Hypothyroidism 01/14/2017   Melanocytic nevi, unspecified 04/18/2017   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   Obesity    Osteoporosis 01/14/2017   07/2018 - spine  -2.3,  RFN  -1.9,  LFN  -2.3   - improvement in hips dexa 07/25/17 - spine  -3.1,   RFN  -2.2,  LFN  -2.4  - started on prolia DEXA 05/13/2010 in [place], osteopenia   Spine: -2.3   LFN  -1.7   RFN:  -1.8     FRAX  15%, 1.9%    Paroxysmal atrial fibrillation 09/29/2020   PFO with atrial septal aneurysm 06/19/2020    Plant dermatitis 01/08/2020   Pneumonia    Purpura 04/18/2017   Rheumatoid arthritis 01/13/2017   Following at [facility] rheumatology  Has been on Prednisone, Plaquenil, Arava, Humira, etolodac, Enbrel, Orencia, Remicade and Rituxan     Sebaceous cyst 04/18/2017   TIA (transient ischemic attack) 04/22/2020   Ventral hernia without obstruction or gangrene 11/01/2019   Noted on Ct scan 10/2019   Vitamin D deficiency 06/22/2017    Patient Active Problem List   Diagnosis Date Noted   Head injury 01/26/2023   Transient alteration of awareness 11/25/2022   PND (post-nasal drip) 11/08/2022   Steatorrhea 11/08/2022   Confusional arousals 09/06/2022   Spells of decreased attentiveness 09/06/2022   Hyponatremia 07/15/2022   Elevated LFTs 07/15/2022   Confusion 05/02/2022   Aortic atherosclerosis (HCC) 01/20/2022   Right lower quadrant abdominal pain 12/22/2021   Constipation 12/22/2021   Blood in stool 12/22/2021   Alcohol abuse 09/20/2021   Paroxysmal atrial fibrillation 09/29/2020   PFO with atrial septal aneurysm s/p closure 06/19/2020   TIA (transient ischemic attack) 04/22/2020   Adjustment disorder with mixed anxiety and depressed mood    Ventral hernia without obstruction or gangrene 11/01/2019   Hypertension 08/30/2019   Vitamin  D deficiency 06/22/2017   Hemangioma 04/18/2017   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   Purpura 04/18/2017   Hypothyroidism 01/14/2017   History of tuberculosis exposure 01/14/2017   Osteoporosis - [facility], Dr Alene Mires 01/14/2017   History of gastric bypass 01/14/2017   History of colon polyps 01/14/2017   Rheumatoid arthritis - [facility], Dr Alene Mires 01/13/2017   GERD (gastroesophageal reflux disease) 01/13/2017    Past Surgical History:  Procedure Laterality Date   APPENDECTOMY     breast enhancement removal Bilateral 03/2022   BREAST ENHANCEMENT SURGERY     BUBBLE STUDY  05/27/2020   Procedure: BUBBLE STUDY;  Surgeon: Sande Rives, MD;   Location: [facility] ENDOSCOPY;  Service: Cardiovascular;;   GASTRIC BYPASS     PATENT FORAMEN OVALE(PFO) CLOSURE N/A 06/19/2020   Procedure: PATENT FORAMEN OVALE (PFO) CLOSURE;  Surgeon: Tonny Bollman, MD;  Location: [facility] INVASIVE CV LAB;  Service: Cardiovascular;  Laterality: N/A;   TEE WITHOUT CARDIOVERSION N/A 05/27/2020   Procedure: TRANSESOPHAGEAL ECHOCARDIOGRAM (TEE);  Surgeon: Sande Rives, MD;  Location: [facility] ENDOSCOPY;  Service: Cardiovascular;  Laterality: N/A;   TONSILLECTOMY      OB History   No obstetric history on file.      Home Medications    Prior to Admission medications   Medication Sig Start Date End Date Taking? Authorizing Provider  amLODipine (NORVASC) 2.5 MG tablet TAKE 1 TABLET(2.5 MG) BY MOUTH DAILY 01/23/23   Burns, Bobette Mo, MD  apixaban (ELIQUIS) 5 MG TABS tablet Take 1 tablet (5 mg total) by mouth 2 (two) times daily. 09/01/22   Tonny Bollman, MD  Ascorbic Acid (VITAMIN C PO) Take 3,200 mg by mouth daily. 1600 mg each    [provider]  Calcium Citrate-Vitamin D (CALCIUM + D PO) Take 1 capsule by mouth 1 day or 1 dose.    [provider]  Cholecalciferol (VITAMIN D3) 250 MCG (10000 UT) TABS Take 5,000 Units by mouth 2 (two) times daily.     [provider]  Coenzyme Q10 (COQ-10) 100 MG CAPS Take 200 mg by mouth daily.    [provider]  Boris Lown Oil 1000 MG CAPS Take 1,000 mg by mouth daily.    [provider]  levothyroxine (SYNTHROID) 25 MCG tablet TAKE 1 TABLET(25 MCG) BY MOUTH DAILY 10/17/22   Burns, Bobette Mo, MD  Misc Natural Products ([employer] GLUCOS-CHONDROIT-MSM TS) TABS Take 2 tablets by mouth at bedtime.    [provider]  Multiple Vitamins-Minerals (CENTRUM SILVER PO) Take 1 tablet by mouth daily.     [provider]  OZEMPIC, 2 MG/DOSE, 8 MG/3ML SOPN Inject 2 mg into the skin once a week. 01/12/22   [provider]  riTUXimab (RITUXAN) 500 MG/50ML injection Inject 10 mg into the vein  every 6 (six) months.    [provider]  triamcinolone ointment (KENALOG) 0.5 % APPLY TOPICALLY TO THE AFFECTED AREA TWICE DAILY 12/26/22   Pincus Sanes, MD  valACYclovir (VALTREX) 500 MG tablet TAKE 1 TABLET(500 MG) BY MOUTH DAILY 09/15/22   Pincus Sanes, MD  venlafaxine XR (EFFEXOR-XR) 150 MG 24 hr capsule TAKE 1 CAPSULE(150 MG) BY MOUTH DAILY WITH BREAKFAST 05/17/22   Pincus Sanes, MD    Family History Family History  Problem Relation Age of Onset   Hypertension Mother    Heart disease Mother    Heart failure Mother    Hypertension Father    Ulcers Father    Gallstones Father  Kidney Stones Father    Hypertension Sister    Hypertension Brother    Heart attack Maternal Grandmother    Colon cancer Neg Hx    Stomach cancer Neg Hx    Esophageal cancer Neg Hx    Alzheimer's disease Neg Hx    Dementia Neg Hx     Social History Social History   Tobacco Use   Smoking status: Never   Smokeless tobacco: Never  Vaping Use   Vaping status: Never Used  Substance Use Topics   Alcohol use: Yes    Alcohol/week: 4.0 standard drinks of alcohol    Types: 2 Glasses of wine, 2 Cans of beer per week    Comment: per week   Drug use: Never     Allergies   Patient has no known allergies.   Review of Systems Review of Systems   Physical Exam Triage Vital Signs ED Triage Vitals  Encounter Vitals Group     BP 01/26/23 1453 (!) 147/77     Systolic BP Percentile --      Diastolic BP Percentile --      Pulse Rate 01/26/23 1453 74     Resp 01/26/23 1453 16     Temp 01/26/23 1453 97.8 F (36.6 C)     Temp Source 01/26/23 1453 Oral     SpO2 01/26/23 1453 98 %     Weight --      Height --      Head Circumference --      Peak Flow --      Pain Score 01/26/23 1452 0     Pain Loc --      Pain Education --      Exclude from Growth Chart --    No data found.  Updated Vital Signs BP (!) 147/77   Pulse 74   Temp 97.8 F (36.6 C) (Oral)   Resp 16   SpO2 98%    Visual Acuity Right Eye Distance:   Left Eye Distance:   Bilateral Distance:    Right Eye Near:   Left Eye Near:    Bilateral Near:     Physical Exam Vitals reviewed.  Constitutional:      General: She is not in acute distress.    Appearance: She is not ill-appearing, toxic-appearing or diaphoretic.  HENT:     Head:     Comments: There is green ecchymosis of the right temporal area, with some hematoma overlying it.     Mouth/Throat:     Mouth: Mucous membranes are moist.  Eyes:     Extraocular Movements: Extraocular movements intact.     Conjunctiva/sclera: Conjunctivae normal.     Pupils: Pupils are equal, round, and reactive to light.     Comments: The right eyelid is dark purple. Not swollen. EOMI.  Cardiovascular:     Rate and Rhythm: Normal rate and regular rhythm.     Heart sounds: No murmur heard. Pulmonary:     Effort: Pulmonary effort is normal.     Breath sounds: Normal breath sounds.  Musculoskeletal:     Cervical back: Neck supple.  Lymphadenopathy:     Cervical: No cervical adenopathy.  Skin:    Coloration: Skin is not jaundiced or pale.  Neurological:     General: No focal deficit present.     Mental Status: She is alert and oriented to person, place, and time.  Psychiatric:        Behavior: Behavior normal.  UC Treatments / Results  Labs (all labs ordered are listed, but only abnormal results are displayed) Labs Reviewed - No data to display  EKG   Radiology No results found.  Procedures Procedures (including critical care time)  Medications Ordered in UC Medications - No data to display  Initial Impression / Assessment and Plan / UC Course  I have reviewed the triage vital signs and the nursing notes.  Pertinent labs & imaging results that were available during my care of the patient were reviewed by me and considered in my medical decision making (see chart for details).       VS are reassuring. We discussed the possible  complications that can occur with a CHI when someone is on a blood thinner. She decided to present to the closest ER for further urgent evaluation. Final Clinical Impressions(s) / UC Diagnoses   Final diagnoses:  Injury of head, initial encounter     Discharge Instructions      Please proceed to the ER for further evaluation    ED Prescriptions   None    PDMP not reviewed this encounter.   Zenia Resides, MD 01/26/23 1513    Zenia Resides, MD 01/26/23 [phone]

## 2023-01-27 ENCOUNTER — Encounter: Payer: Self-pay | Admitting: Internal Medicine

## 2023-01-27 ENCOUNTER — Telehealth: Payer: Self-pay | Admitting: Internal Medicine

## 2023-01-27 MED ORDER — FLUOCINOLONE ACETONIDE 0.01 % OT OIL: 5.0000 [drp] | TOPICAL_OIL | Freq: Two times a day (BID) | OTIC | 0 refills | Status: AC

## 2023-01-27 NOTE — Telephone Encounter (Signed)
Patient has called to state that she was sen in the urgent care recently for 1 initial issue but when they went to check her ears, they noticed wax was in them. They proceeded to clean the patients ear with hydrogen peroxide for about an hour but made the situation completely worse. Patient states she cannot hear as well. States there is no pain, just hard to hear and she is by herself.  Patient is wanting prednisone called into the pharmacy in [place]. If the provider would like to see or speak with the patient she would be happy to do so. Please advise

## 2023-01-30 DIAGNOSIS — R0789 Other chest pain: Secondary | ICD-10-CM | POA: Diagnosis not present

## 2023-01-30 DIAGNOSIS — H6123 Impacted cerumen, bilateral: Secondary | ICD-10-CM | POA: Diagnosis not present

## 2023-01-31 ENCOUNTER — Telehealth: Payer: Self-pay | Admitting: Internal Medicine

## 2023-01-31 NOTE — Telephone Encounter (Signed)
Prescription Request  01/31/2023  LOV: 01/16/2023  What is the name of the medication or equipment? Fexafenadine   Have you contacted your pharmacy to request a refill? Yes   Which pharmacy would you like this sent to?  [facility][id number] Lorenza Evangelist, [place] - [address] AT [facility] [address] [facility] [address] Phone: [phone] Fax: [phone]     Patient notified that their request is being sent to the clinical staff for review and that they should receive a response within 2 business days.   Please advise at Mobile [phone] (mobile)

## 2023-01-31 NOTE — Telephone Encounter (Signed)
Please disregard the request below for the Fexafenadine.

## 2023-02-08 ENCOUNTER — Ambulatory Visit (INDEPENDENT_AMBULATORY_CARE_PROVIDER_SITE_OTHER): Payer: Medicare Other

## 2023-02-08 ENCOUNTER — Ambulatory Visit: Payer: Medicare Other | Admitting: Gastroenterology

## 2023-02-08 ENCOUNTER — Encounter: Payer: Self-pay | Admitting: Gastroenterology

## 2023-02-08 VITALS — BP 110/70 | HR 71 | Ht 64.0 in | Wt 142.0 lb

## 2023-02-08 DIAGNOSIS — Z9884 Bariatric surgery status: Secondary | ICD-10-CM | POA: Diagnosis not present

## 2023-02-08 DIAGNOSIS — K760 Fatty (change of) liver, not elsewhere classified: Secondary | ICD-10-CM | POA: Diagnosis not present

## 2023-02-08 DIAGNOSIS — R7401 Elevation of levels of liver transaminase levels: Secondary | ICD-10-CM

## 2023-02-08 DIAGNOSIS — R194 Change in bowel habit: Secondary | ICD-10-CM

## 2023-02-08 DIAGNOSIS — Z9049 Acquired absence of other specified parts of digestive tract: Secondary | ICD-10-CM

## 2023-02-08 LAB — IBC + FERRITIN
Ferritin: 122.7 ng/mL (ref 10.0–291.0)
Iron: 111 ug/dL (ref 42–145)
Saturation Ratios: 24.8 % (ref 20.0–50.0)
TIBC: 448 ug/dL (ref 250.0–450.0)
Transferrin: 320 mg/dL (ref 212.0–360.0)

## 2023-02-08 MED ORDER — COLESTIPOL HCL 1 G PO TABS: 1.0000 g | ORAL_TABLET | Freq: Two times a day (BID) | ORAL | 2 refills | Status: DC

## 2023-02-08 NOTE — Patient Instructions (Addendum)
If your blood pressure at your visit was 140/90 or greater, please contact your primary care physician to follow up on this. ______________________________________________________  If you are age 70 or older, your body mass index should be between 23-30. Your Body mass index is 24.37 kg/m. If this is out of the aforementioned range listed, please consider follow up with your Primary Care Provider.  If you are age 24 or younger, your body mass index should be between 19-25. Your Body mass index is 24.37 kg/m. If this is out of the aformentioned range listed, please consider follow up with your Primary Care Provider.  ________________________________________________________  The [facility] providers would like to encourage you to use [facility] to communicate with providers for non-urgent requests or questions.  Due to long hold times on the telephone, sending your provider a message by [facility] may be a faster and more efficient way to get a response.  Please allow 48 business hours for a response.  Please remember that this is for non-urgent requests.  _______________________________________________________  Due to recent changes in healthcare laws, you may see the results of your imaging and laboratory studies on MyChart before your provider has had a chance to review them.  We understand that in some cases there may be results that are confusing or concerning to you. Not all laboratory results come back in the same time frame and the provider may be waiting for multiple results in order to interpret others.  Please give [place] 48 hours in order for your provider to thoroughly review all the results before contacting the office for clarification of your results.   We have sent the following medications to your pharmacy for you to pick up at your convenience: Colestid: Take twice a day   Please go to the lab in the basement of our building to have lab work done as you leave today. Hit "B" for basement when  you get on the elevator.  When the doors open the lab is on your left.  We will call you with the results. Thank you.  Thank you for entrusting me with your care and for choosing [facility], Dr. Ileene Patrick

## 2023-02-08 NOTE — Progress Notes (Signed)
HPI :  72 year old female here for a follow-up visit, last seen in August 2023.  Recall she has medical history significant for Roux-en-Y gastric bypass, rheumatoid arthritis on biologic therapy, GERD, TIA, atrial fibrillation, PFO status postclosure on chronic Eliquis.  Here today for some changes in her bowel habits.  She endorses some occasional loose stools that can come and go.  She seems to think this happens a few times per month and when it happens it can last few days at a time.  She will have multiple stools per day that is often postprandial and proceeded by eating something heavy or greasy or fatty.  She has worked on avoiding some of these trigger foods and if she avoids them she does not really have any of the loose stools that bother her.  This is been ongoing for about 6 to 7 months in total.  She states it looks like there is fat or grease in her stool when she sees this.  There is no significant gas or bloating that bothers her.  No abdominal pains.  She had her gallbladder out, she cannot recall how long ago that was.  She denies any blood in her stools.  She has had a remote history of Roux-en-Y gastric bypass in her mid 68s.  Otherwise, she has been losing weight with use of Ozempic since about January of this year.  She thinks she is lost about 10 pounds.  She is also been exercising more.  She denies any other new medication changes.  She is on Rituxan for RA and remains on Eliquis.  She has had a fecal pancreatic elastase for this which was normal.  She had a CT scan in 2023 as outlined below.  She had a right upper quadrant ultrasound in May of this year showing focal fatty deposition and hepatic steatosis otherwise.  There was a question of a benign hemangioma versus focal fatty deposition.   On review of her labs she has had a mild AST and ALT elevation over the past 2 years. AST and ALT have ranged from the 40s to 50s.  Most recently on July 1 her AST was normal at 32  and ALT normal at 23.  She has anywhere from 2 to 3 glasses of wine per week, she does not drink daily and never has drank daily.  Otherwise denies NSAID use routinely.  She has not had further workup of this to date so far.   05/2016 Colonoscopy normal repeat 10 years (04/2026) 09/06/2018 upper endoscopy for epigastric discomfort, noncardiac chest pain normal esophagus, Roux-en-Y gastrojejunostomy with anastomosis characterized by visible suture which was removed, gastric pouch erosion biopsied, normal small bowel limb.   Biopsies were negative for Barrett's esophagus, negative for H. pylori gastritis    Colonoscopy 02/11/2022: - The perianal and digital rectal examinations were normal. - A large amount of liquid stool was found in the entire colon, making visualization difficult. Lavage of the colon was performed using copious amounts of water, resulting in clearance with mostly adequate visualization. The cecal cap could not be visualized due to stool burden that could not be cleared. Most of the rest of the colon could be viewed adequately. - Internal hemorrhoids were found during retroflexion. - The exam was otherwise without abnormality. - Repeat colonoscopy in 2027 (last full screening exam in 2017) for screening purposes with double prep, if not sooner with any concerning symptoms   RUQ [place] 11/2022: IMPRESSION: 1. The hyperechoic region adjacent the  portal vein is likely focal fatty deposition. A benign mass such as a hemangioma is considered less likely. 2. Mild opacity steatosis. 3. No other abnormalities.   CT abdomen / pelvis 12/22/21: IMPRESSION: 1. No acute process in the abdomen or pelvis. 2. Possible constipation. 3. Status post gastrojejunostomy with antecolic Roux loop. Similar appearance of a dilated loop of small bowel within the central lower abdomen, likely due to postoperative atony. No bowel obstruction. 4. Pelvic floor laxity. 5. Duplicated IVC. 6. Aortic  Atherosclerosis (ICD10-I70.0).    Past Medical History:  Diagnosis Date   Adjustment disorder with mixed anxiety and depressed mood    Arthritis    Depression    GERD (gastroesophageal reflux disease) 01/13/2017   Hemangioma 04/18/2017   History of breast augmentation    History of colon polyps 01/14/2017   Last colonoscopy 2018 - no polyps - advised 5 years   History of gastric bypass    History of pelvis fracture    in 30's   History of tuberculosis exposure 01/14/2017   Hx of latent TB, treated for 9 months   Hyperglycemia 09/28/2020   Hypertension    Hypothyroidism 01/14/2017   Melanocytic nevi, unspecified 04/18/2017   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   Obesity    Osteoporosis 01/14/2017   07/2018 - spine  -2.3,  RFN  -1.9,  LFN  -2.3   - improvement in hips dexa 07/25/17 - spine  -3.1,   RFN  -2.2,  LFN  -2.4  - started on prolia DEXA 05/13/2010 in [place], osteopenia   Spine: -2.3   LFN  -1.7   RFN:  -1.8     FRAX  15%, 1.9%    Paroxysmal atrial fibrillation 09/29/2020   PFO with atrial septal aneurysm 06/19/2020   Plant dermatitis 01/08/2020   Pneumonia    Purpura 04/18/2017   Rheumatoid arthritis 01/13/2017   Following at [facility] rheumatology  Has been on Prednisone, Plaquenil, Arava, Humira, etolodac, Enbrel, Orencia, Remicade and Rituxan     Sebaceous cyst 04/18/2017   TIA (transient ischemic attack) 04/22/2020   Ventral hernia without obstruction or gangrene 11/01/2019   Noted on Ct scan 10/2019   Vitamin D deficiency 06/22/2017     Past Surgical History:  Procedure Laterality Date   APPENDECTOMY     breast enhancement removal Bilateral 03/2022   BREAST ENHANCEMENT SURGERY     BUBBLE STUDY  05/27/2020   Procedure: BUBBLE STUDY;  Surgeon: Sande Rives, MD;  Location[facility] ENDOSCOPY;  Service: Cardiovascular;;   GASTRIC BYPASS     PATENT FORAMEN OVALE(PFO) CLOSURE N/A 06/19/2020   Procedure: PATENT FORAMEN OVALE (PFO) CLOSURE;  Surgeon: Tonny Bollman, MD;  [facility] INVASIVE CV LAB;  Service: Cardiovascular;  Laterality: N/A;   TEE WITHOUT CARDIOVERSION N/A 05/27/2020   Procedure: TRANSESOPHAGEAL ECHOCARDIOGRAM (TEE);  Surgeon: Sande Rives, MD;  Location: [facility] ENDOSCOPY;  Service: Cardiovascular;  Laterality: N/A;   TONSILLECTOMY     Family History  Problem Relation Age of Onset   Hypertension Mother    Heart disease Mother    Heart failure Mother    Hypertension Father    Ulcers Father    Gallstones Father    Kidney Stones Father    Hypertension Sister    Hypertension Brother    Heart attack Maternal Grandmother    Colon cancer Neg Hx    Stomach cancer Neg Hx    Esophageal cancer Neg Hx    Alzheimer's disease Neg Hx  Dementia Neg Hx    Social History   Tobacco Use   Smoking status: Never   Smokeless tobacco: Never  Vaping Use   Vaping status: Never Used  Substance Use Topics   Alcohol use: Yes    Alcohol/week: 4.0 standard drinks of alcohol    Types: 2 Glasses of wine, 2 Cans of beer per week    Comment: per week   Drug use: Never   Current Outpatient Medications  Medication Sig Dispense Refill   amLODipine (NORVASC) 2.5 MG tablet TAKE 1 TABLET(2.5 MG) BY MOUTH DAILY 90 tablet 3   apixaban (ELIQUIS) 5 MG TABS tablet Take 1 tablet (5 mg total) by mouth 2 (two) times daily. 180 tablet 1   Ascorbic Acid (VITAMIN C PO) Take 3,200 mg by mouth daily. 1600 mg each     Calcium Citrate-Vitamin D (CALCIUM + D PO) Take 1 capsule by mouth 1 day or 1 dose.     Cholecalciferol (VITAMIN D3) 250 MCG (10000 UT) TABS Take 5,000 Units by mouth 2 (two) times daily.      Coenzyme Q10 (COQ-10) 100 MG CAPS Take 200 mg by mouth daily.     Krill Oil 1000 MG CAPS Take 1,000 mg by mouth daily.     levothyroxine (SYNTHROID) 25 MCG tablet TAKE 1 TABLET(25 MCG) BY MOUTH DAILY 90 tablet 3   Misc Natural Products (CVS GLUCOS-CHONDROIT-MSM TS) TABS Take 2 tablets by mouth at bedtime.     Multiple Vitamins-Minerals (CENTRUM  SILVER PO) Take 1 tablet by mouth daily.      OZEMPIC, 2 MG/DOSE, 8 MG/3ML SOPN Inject 2 mg into the skin once a week.     riTUXimab (RITUXAN) 500 MG/50ML injection Inject 10 mg into the vein every 6 (six) months.     triamcinolone ointment (KENALOG) 0.5 % APPLY TOPICALLY TO THE AFFECTED AREA TWICE DAILY 30 g 0   valACYclovir (VALTREX) 500 MG tablet TAKE 1 TABLET(500 MG) BY MOUTH DAILY 90 tablet 1   venlafaxine XR (EFFEXOR-XR) 150 MG 24 hr capsule TAKE 1 CAPSULE(150 MG) BY MOUTH DAILY WITH BREAKFAST 90 capsule 3   No current facility-administered medications for this visit.   Facility-Administered Medications Ordered in Other Visits  Medication Dose Route Frequency Provider Last Rate Last Admin   sodium chloride flush (NS) 0.9 % injection 10 mL  10 mL Intravenous PRN Cline Crock R, PA-C   20 mL at 07/16/21 1025   No Known Allergies   Review of Systems: All systems reviewed and negative except where noted in HPI.   Lab Results  Component Value Date   WBC 4.0 01/09/2023   HGB 13.3 01/09/2023   HCT 40.6 01/09/2023   MCV 97.8 01/09/2023   PLT 319.0 01/09/2023    Lab Results  Component Value Date   NA 133 (L) 01/09/2023   CL 95 (L) 01/09/2023   K 4.4 01/09/2023   CO2 31 01/09/2023   BUN 12 01/09/2023   CREATININE 0.53 01/09/2023   GFR 94.17 01/09/2023   CALCIUM 10.8 (H) 01/09/2023   ALBUMIN 4.8 01/09/2023   GLUCOSE 84 01/09/2023    Lab Results  Component Value Date   ALT 23 01/09/2023   AST 32 01/09/2023   ALKPHOS 60 01/09/2023   BILITOT 0.5 01/09/2023      Physical Exam: BP 110/70   Pulse 71   Ht 5\' 4"  (1.626 m)   Wt 142 lb (64.4 kg)   BMI 24.37 kg/m  Constitutional: Pleasant,well-developed, female in no acute  distress. Neurological: Alert and oriented to person place and time. Psychiatric: Normal mood and affect. Behavior is normal.   ASSESSMENT: 72 y.o. female here for assessment of the following  1. Altered bowel habits   2. History of gastric  bypass   3. History of cholecystectomy   4. Fatty liver   5. Transaminitis    As above, several months worth of more urgent loose stool concerning for possible steatorrhea when she eats a heavy meal.  Fecal pancreatic elastase is normal.  If she avoids eating these trigger foods she tends to not have symptoms.  We discussed differential diagnosis.  She has postcholecystectomy and potentially that is related.  Time course otherwise winds up with her starting Ozempic which can also alter bowel habits.  We discussed options.  I will give her a trial of Colestid 1 g twice daily to use as needed, if she feels like she is going to eat something that can produce symptoms will see if this helps her.  Her colonoscopy is up-to-date with any concerning changes. She plans on coming off of Ozempic at some point in time in the upcoming months, will see if the symptoms go away when she stops that otherwise.  She does otherwise complain of any gas or bloating in regards to possible underlying SIBO, I think can hold off on testing for that for now.  Will await her course.  Noted to have fatty liver on imaging with mild intermittent transaminitis.  This very well could be due to fatty liver but given duration of elevation of liver enzymes recommend screening for other chronic liver diseases.  Will have her go to the lab today to evaluate this.  Otherwise counseled her on fatty liver which may likely be the cause of this.  She has been working on diet and weight loss and on Ozempic and lost 10 pounds with normalization of her liver enzymes.  Will check her liver enzymes once yearly.  Discussed the benefits of routine coffee intake to help prevent fibrotic change.  She will continue to work on exercise.  I will see her once yearly for this issue.  She agrees  PLAN: - start trial of colestid 1gm BID - will use PRN, symptoms really fluctuate with eating. Will see how she does once Ozempic stops as well. If symptoms persist she  will contact me - otherwise try to avoid trigger foods - counseled her on fatty liver - risks for fibrosis / cirrhosis - on Ozempic, working on diet / weight loss - encouraged routine coffee intake - will workup transaminitis further with labs   - f/u yearly for fatty liver  Harlin Rain, MD [facility]

## 2023-02-13 ENCOUNTER — Other Ambulatory Visit: Payer: Self-pay

## 2023-02-13 NOTE — Progress Notes (Signed)
Orders for Hep A and hep B vaccines entered.  See results notes

## 2023-02-15 ENCOUNTER — Telehealth: Payer: Self-pay | Admitting: Gastroenterology

## 2023-02-15 ENCOUNTER — Ambulatory Visit (INDEPENDENT_AMBULATORY_CARE_PROVIDER_SITE_OTHER): Payer: Medicare Other | Admitting: Gastroenterology

## 2023-02-15 DIAGNOSIS — Z23 Encounter for immunization: Secondary | ICD-10-CM | POA: Diagnosis not present

## 2023-02-15 NOTE — Telephone Encounter (Signed)
Patient came in for her Hep injection and stopped at the front desk to request a phone call from Dr. Lanetta Inch nurse. She would like to know why he prescribed her colstid. Please advise.

## 2023-02-15 NOTE — Telephone Encounter (Signed)
Left message for patient that Dr. Adela Lank gave her Colestid BID prn for loose stool after eating.  See OV from July

## 2023-02-16 NOTE — Progress Notes (Signed)
My coworker gave the injections yesterday but failed to put the charges. They have been added so hopefully now you can sign off Sir. Thank you.

## 2023-02-21 ENCOUNTER — Encounter: Payer: Self-pay | Admitting: Internal Medicine

## 2023-03-01 ENCOUNTER — Other Ambulatory Visit: Payer: Self-pay | Admitting: Internal Medicine

## 2023-03-01 ENCOUNTER — Other Ambulatory Visit: Payer: Self-pay | Admitting: *Deleted

## 2023-03-01 DIAGNOSIS — I480 Paroxysmal atrial fibrillation: Secondary | ICD-10-CM

## 2023-03-01 MED ORDER — APIXABAN 5 MG PO TABS
5.0000 mg | ORAL_TABLET | Freq: Two times a day (BID) | ORAL | 0 refills | Status: DC
Start: 2023-03-01 — End: 2023-05-31

## 2023-03-01 NOTE — Telephone Encounter (Signed)
Eliquis 5mg  refill request received. Patient is 72 years old, weight-64.4kg, Crea-0.53 on 01/09/23, Diagnosis-Afib, and last seen by Dr. Excell Seltzer on 02/15/22 & pending an appt on 03/10/23. Dose is appropriate based on dosing criteria. Will send in refill to requested pharmacy.

## 2023-03-06 DIAGNOSIS — D692 Other nonthrombocytopenic purpura: Secondary | ICD-10-CM | POA: Diagnosis not present

## 2023-03-06 DIAGNOSIS — L821 Other seborrheic keratosis: Secondary | ICD-10-CM | POA: Diagnosis not present

## 2023-03-06 DIAGNOSIS — L720 Epidermal cyst: Secondary | ICD-10-CM | POA: Diagnosis not present

## 2023-03-06 DIAGNOSIS — L570 Actinic keratosis: Secondary | ICD-10-CM | POA: Diagnosis not present

## 2023-03-06 DIAGNOSIS — D225 Melanocytic nevi of trunk: Secondary | ICD-10-CM | POA: Diagnosis not present

## 2023-03-10 ENCOUNTER — Encounter: Payer: Self-pay | Admitting: Cardiovascular Disease

## 2023-03-10 ENCOUNTER — Ambulatory Visit: Payer: Medicare Other | Attending: Cardiovascular Disease | Admitting: Cardiovascular Disease

## 2023-03-10 VITALS — BP 120/80 | HR 74 | Ht 63.0 in | Wt 141.6 lb

## 2023-03-10 DIAGNOSIS — I480 Paroxysmal atrial fibrillation: Secondary | ICD-10-CM

## 2023-03-10 DIAGNOSIS — I10 Essential (primary) hypertension: Secondary | ICD-10-CM | POA: Diagnosis not present

## 2023-03-10 DIAGNOSIS — Q2112 Patent foramen ovale: Secondary | ICD-10-CM | POA: Insufficient documentation

## 2023-03-10 NOTE — Patient Instructions (Signed)

## 2023-03-10 NOTE — Progress Notes (Signed)
Cardiology Office Note:    Date:  03/10/2023   ID:  Julia [name], DOB [birth year], MRN [id number]  PCP:  Pincus Sanes, MD   [facility] Providers Cardiologist:  Tonny Bollman, MD     Referring MD: Pincus Sanes, MD   Chief Complaint  Patient presents with   Atrial Fibrillation    History of Present Illness:    Julia [name] is a 72 y.o. female with a hx of:  Patent Foramen Ovale  S/p closure in 12/21 Paroxysmal atrial fibrillation on chronic OAC with apixaban Rheumatoid arthritis Hx of TIA  Hypertension  Hx of bariatric surgery   The patient is here alone today. She's doing really well. She is working out with a Systems analyst at least 3 days/week and feels good with physical exertion. She is stronger and is not experiencing any exertional symptoms. Today, she denies symptoms of palpitations, chest pain, shortness of breath, orthopnea, PND, lower extremity edema, dizziness, or syncope.  Past Medical History:  Diagnosis Date   Adjustment disorder with mixed anxiety and depressed mood    Arthritis    Depression    GERD (gastroesophageal reflux disease) 01/13/2017   Hemangioma 04/18/2017   History of breast augmentation    History of colon polyps 01/14/2017   Last colonoscopy 2018 - no polyps - advised 5 years   History of gastric bypass    History of pelvis fracture    in 30's   History of tuberculosis exposure 01/14/2017   Hx of latent TB, treated for 9 months   Hyperglycemia 09/28/2020   Hypertension    Hypothyroidism 01/14/2017   Melanocytic nevi, unspecified 04/18/2017   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   Obesity    Osteoporosis 01/14/2017   07/2018 - spine  -2.3,  RFN  -1.9,  LFN  -2.3   - improvement in hips dexa 07/25/17 - spine  -3.1,   RFN  -2.2,  LFN  -2.4  - started on prolia DEXA 05/13/2010 in [place], osteopenia   Spine: -2.3   LFN  -1.7   RFN:  -1.8     FRAX  15%, 1.9%    Paroxysmal atrial fibrillation 09/29/2020   PFO with atrial  septal aneurysm 06/19/2020   Plant dermatitis 01/08/2020   Pneumonia    Purpura 04/18/2017   Rheumatoid arthritis 01/13/2017   Following at [facility] rheumatology  Has been on Prednisone, Plaquenil, Arava, Humira, etolodac, Enbrel, Orencia, Remicade and Rituxan     Sebaceous cyst 04/18/2017   TIA (transient ischemic attack) 04/22/2020   Ventral hernia without obstruction or gangrene 11/01/2019   Noted on Ct scan 10/2019   Vitamin D deficiency 06/22/2017    Past Surgical History:  Procedure Laterality Date   APPENDECTOMY     breast enhancement removal Bilateral 03/2022   BREAST ENHANCEMENT SURGERY     BUBBLE STUDY  05/27/2020   Procedure: BUBBLE STUDY;  Surgeon: Sande Rives, MD;  Location: [facility] ENDOSCOPY;  Service: Cardiovascular;;   GASTRIC BYPASS     PATENT FORAMEN OVALE(PFO) CLOSURE N/A 06/19/2020   Procedure: PATENT FORAMEN OVALE (PFO) CLOSURE;  Surgeon: Tonny Bollman, MD;  Location: [facility] INVASIVE CV LAB;  Service: Cardiovascular;  Laterality: N/A;   TEE WITHOUT CARDIOVERSION N/A 05/27/2020   Procedure: TRANSESOPHAGEAL ECHOCARDIOGRAM (TEE);  Surgeon: Sande Rives, MD;  Location: [facility] ENDOSCOPY;  Service: Cardiovascular;  Laterality: N/A;   TONSILLECTOMY      Current Medications: Current Meds  Medication Sig   amLODipine (NORVASC) 2.5 MG tablet TAKE  1 TABLET(2.5 MG) BY MOUTH DAILY   apixaban (ELIQUIS) 5 MG TABS tablet Take 1 tablet (5 mg total) by mouth 2 (two) times daily.   Ascorbic Acid (VITAMIN C PO) Take 3,200 mg by mouth daily. 1600 mg each   Calcium Citrate-Vitamin D (CALCIUM + D PO) Take 1 capsule by mouth 1 day or 1 dose.   Cholecalciferol (VITAMIN D3) 250 MCG (10000 UT) TABS Take 5,000 Units by mouth 2 (two) times daily.    Coenzyme Q10 (COQ-10) 100 MG CAPS Take 200 mg by mouth daily.   colestipol (COLESTID) 1 g tablet Take 1 tablet (1 g total) by mouth 2 (two) times daily.   Krill Oil 1000 MG CAPS Take 1,000 mg by mouth daily.   levothyroxine (SYNTHROID)  25 MCG tablet TAKE 1 TABLET(25 MCG) BY MOUTH DAILY   Misc Natural Products (CVS GLUCOS-CHONDROIT-MSM TS) TABS Take 2 tablets by mouth at bedtime.   Multiple Vitamins-Minerals (CENTRUM SILVER PO) Take 1 tablet by mouth daily.    OZEMPIC, 2 MG/DOSE, 8 MG/3ML SOPN Inject 2 mg into the skin once a week.   riTUXimab (RITUXAN) 500 MG/50ML injection Inject 10 mg into the vein every 6 (six) months.   triamcinolone ointment (KENALOG) 0.5 % APPLY TOPICALLY TO THE AFFECTED AREA TWICE DAILY   valACYclovir (VALTREX) 500 MG tablet TAKE 1 TABLET(500 MG) BY MOUTH DAILY   venlafaxine XR (EFFEXOR-XR) 150 MG 24 hr capsule TAKE 1 CAPSULE(150 MG) BY MOUTH DAILY WITH BREAKFAST     Allergies:   Patient has no known allergies.   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[employer]  Tobacco Use   Smoking status: Never   Smokeless tobacco: Never  Vaping Use   Vaping status: Never Used  Substance and Sexual Activity   Alcohol use: Yes    Alcohol/week: 4.0 standard drinks of alcohol    Types: 2 Glasses of wine, 2 Cans of beer per week    Comment: per week   Drug use: Never   Sexual activity: Not on file  Other Topics Concern   Not on file  Social History Narrative   Retired 07/10/17 from [employer]   No regular exercise      Lives at home with husband   Caffeine: 2 glasses/day diet dr pepper   Social Determinants of Health   Financial Resource Strain: Low Risk  (02/14/2022)   Overall Financial Resource Strain (CARDIA)    Difficulty of Paying Living Expenses: Not hard at all  Food Insecurity: No Food Insecurity (09/19/2022)   Received from [facility], [facility]   Hunger Vital Sign    Worried About Running Out of Food in the Last Year: Never true    Ran Out  of Food in the Last Year: Never true  Transportation Needs: No Transportation Needs (09/19/2022)   Received from [facility], [facility]   [facility] - Transportation    In the past 12 months, has lack of transportation kept you from medical appointments or from getting medications?: No    Lack of Transportation (Non-Medical): No  Physical Activity: Insufficiently Active (02/14/2022)   Exercise Vital Sign    Days of Exercise per Week: 3 days  Minutes of Exercise per Session: 30 min  Stress: Stress Concern Present (02/14/2022)   [employer] [facility] [employer] - Occupational Stress Questionnaire    Feeling of Stress : To some extent  Social Connections: Unknown (01/26/2023)   Received from [facility]   Social Network    Social Network: Not on file     Family History: The patient's family history includes Gallstones in her father; Heart attack in her maternal grandmother; Heart disease in her mother; Heart failure in her mother; Hypertension in her brother, father, mother, and sister; Kidney Stones in her father; Ulcers in her father. There is no history of Colon cancer, Stomach cancer, Esophageal cancer, Alzheimer's disease, or Dementia.  ROS:   Please see the history of present illness.    All other systems reviewed and are negative.  EKGs/Labs/Other Studies Reviewed:    The following studies were reviewed today: EKG Interpretation Date/Time:  Friday March 10 2023 14:01:15 EDT Ventricular Rate:  74 PR Interval:  180 QRS Duration:  82 QT Interval:  396 QTC Calculation: 439 R Axis:   2  Text Interpretation: Normal sinus rhythm Normal ECG When compared with ECG of 09-Jul-2020 13:41, Minimal criteria for Anterior infarct are no longer Present Confirmed by Tonny Bollman [phone]) on 03/10/2023 2:11:02 PM    Recent Labs: 01/09/2023: ALT 23; BUN 12; Creatinine, Ser 0.53; Hemoglobin 13.3; Platelets 319.0; Potassium 4.4; Sodium 133; TSH 1.62   Recent Lipid Panel    Component Value Date/Time   CHOL 201 (H) 01/09/2023 1023   TRIG 56.0 01/09/2023 1023   HDL 117.80 01/09/2023 1023   CHOLHDL 2 01/09/2023 1023   VLDL 11.2 01/09/2023 1023   LDLCALC 72 01/09/2023 1023     Risk Assessment/Calculations:    CHA2DS2-VASc Score = 3   This indicates a 3.2% annual risk of stroke. The patient's score is based upon: CHF History: 0 HTN History: 1 Diabetes History: 0 Stroke History: 0 Vascular Disease History: 0 Age Score: 1 Gender Score: 1               Physical Exam:    VS:  BP 120/80   Pulse 74   Ht 5\' 3"  (1.6 m)   Wt 141 lb 9.6 oz (64.2 kg)   SpO2 99%   BMI 25.08 kg/m     Wt Readings from Last 3 Encounters:  03/10/23 141 lb 9.6 oz (64.2 kg)  02/08/23 142 lb (64.4 kg)  01/16/23 140 lb (63.5 kg)     GEN:  Well nourished, well developed in no acute distress HEENT: Normal NECK: No JVD; No carotid bruits LYMPHATICS: No lymphadenopathy CARDIAC: RRR, no murmurs, rubs, gallops RESPIRATORY:  Clear to auscultation without rales, wheezing or rhonchi  ABDOMEN: Soft, non-tender, non-distended MUSCULOSKELETAL:  No edema; No deformity  SKIN: Warm and dry NEUROLOGIC:  Alert and oriented x 3 PSYCHIATRIC:  Normal affect   ASSESSMENT:    1. PFO (patent foramen ovale)   2. PAF (paroxysmal atrial fibrillation) (HCC)   3. Essential hypertension    PLAN:    In order of problems listed above:  Patient now 3 years out from transcatheter closure, continues to do fine.  Negative bubble study demonstrated 1 year out from closure. Rare episodes of atrial fibrillation noted.  Anticoagulated with apixaban.  Completely asymptomatic at this point.  No bleeding problems reported. Blood pressure well-controlled on current medical therapy.  She is taking amlodipine 2.5 mg daily.  She has instituted a really consistent exercise program and I  am pleased with all of the positive things she is doing with her lifestyle and exercise.  I  will plan to see her back in 1 year for follow-up evaluation.  As part of her evaluation today, I reviewed her recent labs from July 2024 which show an LDL cholesterol of 72, potassium 4.4, creatinine 0.5, and hemoglobin A1c 4.9.      Medication Adjustments/Labs and Tests Ordered: Current medicines are reviewed at length with the patient today.  Concerns regarding medicines are outlined above.  Orders Placed This Encounter  Procedures   EKG 12-Lead   No orders of the defined types were placed in this encounter.   There are no Patient Instructions on file for this visit.   Signed, Tonny Bollman, MD  03/10/2023 2:28 PM    [facility]

## 2023-03-16 DIAGNOSIS — A609 Anogenital herpesviral infection, unspecified: Secondary | ICD-10-CM | POA: Diagnosis not present

## 2023-03-16 DIAGNOSIS — N951 Menopausal and female climacteric states: Secondary | ICD-10-CM | POA: Diagnosis not present

## 2023-03-16 DIAGNOSIS — M069 Rheumatoid arthritis, unspecified: Secondary | ICD-10-CM | POA: Diagnosis not present

## 2023-03-16 DIAGNOSIS — Z0142 Encounter for cervical smear to confirm findings of recent normal smear following initial abnormal smear: Secondary | ICD-10-CM | POA: Diagnosis not present

## 2023-03-16 DIAGNOSIS — Z01419 Encounter for gynecological examination (general) (routine) without abnormal findings: Secondary | ICD-10-CM | POA: Diagnosis not present

## 2023-03-16 DIAGNOSIS — Z124 Encounter for screening for malignant neoplasm of cervix: Secondary | ICD-10-CM | POA: Diagnosis not present

## 2023-03-16 DIAGNOSIS — Z23 Encounter for immunization: Secondary | ICD-10-CM | POA: Diagnosis not present

## 2023-03-16 DIAGNOSIS — Z1151 Encounter for screening for human papillomavirus (HPV): Secondary | ICD-10-CM | POA: Diagnosis not present

## 2023-03-16 DIAGNOSIS — Z1231 Encounter for screening mammogram for malignant neoplasm of breast: Secondary | ICD-10-CM | POA: Diagnosis not present

## 2023-03-16 DIAGNOSIS — Z1272 Encounter for screening for malignant neoplasm of vagina: Secondary | ICD-10-CM | POA: Diagnosis not present

## 2023-03-16 DIAGNOSIS — E039 Hypothyroidism, unspecified: Secondary | ICD-10-CM | POA: Diagnosis not present

## 2023-03-20 ENCOUNTER — Ambulatory Visit: Payer: Medicare Other

## 2023-03-20 ENCOUNTER — Ambulatory Visit (INDEPENDENT_AMBULATORY_CARE_PROVIDER_SITE_OTHER): Payer: Medicare Other | Admitting: Gastroenterology

## 2023-03-20 DIAGNOSIS — Z23 Encounter for immunization: Secondary | ICD-10-CM

## 2023-03-20 NOTE — Progress Notes (Unsigned)
Pt presents today for nurse visit for an injection. Per Dr. Adela Lank, injection of Heplisav B given today by Cristela Felt, CMA. Denies any adverse reactions or discomfort.

## 2023-03-22 ENCOUNTER — Other Ambulatory Visit: Payer: Self-pay | Admitting: Internal Medicine

## 2023-04-16 ENCOUNTER — Other Ambulatory Visit: Payer: Self-pay | Admitting: Internal Medicine

## 2023-04-17 DIAGNOSIS — Z5181 Encounter for therapeutic drug level monitoring: Secondary | ICD-10-CM | POA: Diagnosis not present

## 2023-04-17 DIAGNOSIS — Z79899 Other long term (current) drug therapy: Secondary | ICD-10-CM | POA: Diagnosis not present

## 2023-04-17 DIAGNOSIS — M0609 Rheumatoid arthritis without rheumatoid factor, multiple sites: Secondary | ICD-10-CM | POA: Diagnosis not present

## 2023-04-17 DIAGNOSIS — J310 Chronic rhinitis: Secondary | ICD-10-CM | POA: Diagnosis not present

## 2023-05-09 DIAGNOSIS — Z9049 Acquired absence of other specified parts of digestive tract: Secondary | ICD-10-CM | POA: Diagnosis not present

## 2023-05-09 DIAGNOSIS — Z9882 Breast implant status: Secondary | ICD-10-CM | POA: Diagnosis not present

## 2023-05-09 DIAGNOSIS — N6453 Retraction of nipple: Secondary | ICD-10-CM | POA: Diagnosis not present

## 2023-05-19 ENCOUNTER — Other Ambulatory Visit: Payer: Self-pay | Admitting: Internal Medicine

## 2023-05-23 ENCOUNTER — Other Ambulatory Visit: Payer: Self-pay

## 2023-05-23 MED ORDER — COLESTIPOL HCL 1 G PO TABS: 1.0000 g | ORAL_TABLET | Freq: Two times a day (BID) | ORAL | 2 refills | Status: DC

## 2023-05-26 DIAGNOSIS — M0609 Rheumatoid arthritis without rheumatoid factor, multiple sites: Secondary | ICD-10-CM | POA: Diagnosis not present

## 2023-05-31 ENCOUNTER — Other Ambulatory Visit: Payer: Self-pay | Admitting: Cardiovascular Disease

## 2023-05-31 DIAGNOSIS — I480 Paroxysmal atrial fibrillation: Secondary | ICD-10-CM

## 2023-05-31 NOTE — Telephone Encounter (Signed)
Eliquis 5mg  refill request received. Patient is 72 years old, weight-64.2kg, Crea-0.53 on 01/09/23, Diagnosis-Afib, and last seen by Dr. Excell Seltzer on 03/10/23. Dose is appropriate based on dosing criteria. Will send in refill to requested pharmacy.

## 2023-07-20 ENCOUNTER — Encounter (HOSPITAL_BASED_OUTPATIENT_CLINIC_OR_DEPARTMENT_OTHER): Payer: Self-pay

## 2023-07-20 ENCOUNTER — Emergency Department (HOSPITAL_BASED_OUTPATIENT_CLINIC_OR_DEPARTMENT_OTHER)
Admission: EM | Admit: 2023-07-20 | Discharge: 2023-07-20 | Disposition: A | Discharge status: Home / Self Care | Payer: Medicare Other | Attending: Emergency Medicine | Admitting: Emergency Medicine

## 2023-07-20 ENCOUNTER — Other Ambulatory Visit: Payer: Self-pay

## 2023-07-20 ENCOUNTER — Emergency Department (HOSPITAL_BASED_OUTPATIENT_CLINIC_OR_DEPARTMENT_OTHER): Payer: Medicare Other

## 2023-07-20 DIAGNOSIS — R9431 Abnormal electrocardiogram [ECG] [EKG]: Secondary | ICD-10-CM | POA: Diagnosis not present

## 2023-07-20 DIAGNOSIS — W01198A Fall on same level from slipping, tripping and stumbling with subsequent striking against other object, initial encounter: Secondary | ICD-10-CM | POA: Insufficient documentation

## 2023-07-20 DIAGNOSIS — Z79899 Other long term (current) drug therapy: Secondary | ICD-10-CM | POA: Diagnosis not present

## 2023-07-20 DIAGNOSIS — S0993XA Unspecified injury of face, initial encounter: Secondary | ICD-10-CM | POA: Diagnosis present

## 2023-07-20 DIAGNOSIS — Z7901 Long term (current) use of anticoagulants: Secondary | ICD-10-CM | POA: Diagnosis not present

## 2023-07-20 DIAGNOSIS — S0083XA Contusion of other part of head, initial encounter: Secondary | ICD-10-CM | POA: Diagnosis not present

## 2023-07-20 DIAGNOSIS — R9082 White matter disease, unspecified: Secondary | ICD-10-CM | POA: Diagnosis not present

## 2023-07-20 DIAGNOSIS — Z043 Encounter for examination and observation following other accident: Secondary | ICD-10-CM | POA: Diagnosis not present

## 2023-07-20 DIAGNOSIS — I10 Essential (primary) hypertension: Secondary | ICD-10-CM | POA: Diagnosis not present

## 2023-07-20 DIAGNOSIS — W19XXXA Unspecified fall, initial encounter: Secondary | ICD-10-CM

## 2023-07-20 NOTE — ED Provider Notes (Signed)
 [place] [facility] DEPARTMENT AT [facility] Provider Note   CSN: [id number] Arrival date & time: 07/20/23  1037     History  Chief Complaint  Patient presents with   Fall    Julia [name] is a 73 y.o. female.  With a history of hypertension, GERD, arthritis, paroxysmal A-fib on Eliquis  presenting to the ED for evaluation of a fall.  She states approximately 12 hours prior to arrival she was at home trying to get a pair of boots down from the closet.  She was on a stepstool approximately 1 foot off of the ground.  She lost her footing and fell off of the stool.  She believes she struck the left side of her head on a government social research officer.  She lives alone.  She reports potential 5-minute loss of consciousness.  She felt groggy afterwards.  No nausea, vomiting or seizure-like activity.  She reports mild pain to the left temple.  She denies any other symptoms.  Specifically denies headache, vision loss, pain with eye movements, neck pain, back pain, hip pain.  She is mostly concerned about some perceived indentations to the left temple and some bruising.   Fall Associated symptoms include headaches.       Home Medications Prior to Admission medications   Medication Sig Start Date End Date Taking? Authorizing Provider  amLODipine  (NORVASC ) 2.5 MG tablet TAKE 1 TABLET(2.5 MG) BY MOUTH DAILY 04/17/23   Geofm Glade PARAS, MD  apixaban  (ELIQUIS ) 5 MG TABS tablet TAKE 1 TABLET(5 MG) BY MOUTH TWICE DAILY 05/31/23   Wonda Sharper, MD  Ascorbic Acid (VITAMIN C PO) Take 3,200 mg by mouth daily. 1600 mg each    [provider]  Calcium Citrate-Vitamin D  (CALCIUM + D PO) Take 1 capsule by mouth 1 day or 1 dose.    [provider]  Cholecalciferol (VITAMIN D3) 250 MCG (10000 UT) TABS Take 5,000 Units by mouth 2 (two) times daily.     [provider]  Coenzyme Q10 (COQ-10) 100 MG CAPS Take 200 mg by mouth daily.    [provider]  colestipol  (COLESTID ) 1 g  tablet Take 1 tablet (1 g total) by mouth 2 (two) times daily. 05/23/23   Armbruster, Elspeth SQUIBB, MD  Krill Oil 1000 MG CAPS Take 1,000 mg by mouth daily.    [provider]  levothyroxine  (SYNTHROID ) 25 MCG tablet TAKE 1 TABLET(25 MCG) BY MOUTH DAILY 10/17/22   Burns, Glade PARAS, MD  Misc Natural Products ([employer] GLUCOS-CHONDROIT-MSM TS) TABS Take 2 tablets by mouth at bedtime.    [provider]  Multiple Vitamins-Minerals (CENTRUM SILVER PO) Take 1 tablet by mouth daily.     [provider]  OZEMPIC, 2 MG/DOSE, 8 MG/3ML SOPN Inject 2 mg into the skin once a week. 01/12/22   [provider]  riTUXimab  (RITUXAN ) 500 MG/50ML injection Inject 10 mg into the vein every 6 (six) months.    [provider]  triamcinolone  ointment (KENALOG ) 0.5 % APPLY TOPICALLY TO THE AFFECTED AREA TWICE DAILY 12/26/22   Geofm Glade PARAS, MD  valACYclovir  (VALTREX ) 500 MG tablet TAKE 1 TABLET(500 MG) BY MOUTH DAILY 03/23/23   Geofm Glade PARAS, MD  venlafaxine  XR (EFFEXOR -XR) 150 MG 24 hr capsule TAKE 1 CAPSULE(150 MG) BY MOUTH DAILY WITH BREAKFAST 05/19/23   Geofm Glade PARAS, MD      Allergies    Patient has no known allergies.    Review of Systems   Review of Systems  Skin:  Positive for wound.  Neurological:  Positive for headaches.  All other systems reviewed and are negative.   Physical Exam Updated Vital Signs BP 129/68 (BP Location: Right Arm)   Pulse 87   Temp 98 F (36.7 C) (Oral)   Resp 18   SpO2 100%  Physical Exam Vitals and nursing note reviewed.  Constitutional:      General: She is not in acute distress.    Appearance: Normal appearance. She is normal weight. She is not ill-appearing.  HENT:     Head: Normocephalic and atraumatic.     Comments: No raccoon eyes or Battle sign    Right Ear: Tympanic membrane and ear canal normal.     Left Ear: Tympanic membrane and ear canal normal.     Ears:     Comments: No hemotympanums Eyes:     Extraocular Movements:  Extraocular movements intact.     Pupils: Pupils are equal, round, and reactive to light.     Comments: No traumatic hyphema.  EOM intact in all cardinal directions.  No nystagmus.  Mild swelling to the left supraorbital region with minimal bruising  Pulmonary:     Effort: Pulmonary effort is normal. No respiratory distress.  Abdominal:     General: Abdomen is flat.  Musculoskeletal:        General: Normal range of motion.     Cervical back: Neck supple.  Skin:    General: Skin is warm and dry.  Neurological:     General: No focal deficit present.     Mental Status: She is alert and oriented to person, place, and time.     Comments:   MENTAL STATUS: AAOx3   LANG/SPEECH: Fluent, intact naming, repetition & comprehension   CRANIAL NERVES:   II: Pupils equal and reactive   III, IV, VI: EOM intact, no gaze preference or deviation, no nystagmus   V: normal sensation of the face   VII: no facial asymmetry   VIII: normal hearing to speech   MOTOR: 5/5 in both upper and lower extremities   SENSORY: Normal to touch in all extremiteis   COORD: Normal finger to nose, heel to shin and shoulder shrug, no tremor, no dysmetria. No pronator drift   Psychiatric:        Mood and Affect: Mood normal.        Behavior: Behavior normal.     ED Results / Procedures / Treatments   Labs (all labs ordered are listed, but only abnormal results are displayed) Labs Reviewed - No data to display  EKG EKG Interpretation Date/Time:  Thursday July 20 2023 11:28:48 EST Ventricular Rate:  78 PR Interval:  181 QRS Duration:  87 QT Interval:  401 QTC Calculation: 457 R Axis:   25  Text Interpretation: Sinus rhythm No significant change since last tracing Confirmed by Dreama Longs ([id number]) on 07/20/2023 11:35:21 AM  Radiology CT Head Wo Contrast Result Date: 07/20/2023 CLINICAL DATA:  Fall last night, hit face on fire extinguisher EXAM: CT HEAD WITHOUT CONTRAST CT MAXILLOFACIAL WITHOUT CONTRAST CT  CERVICAL SPINE WITHOUT CONTRAST TECHNIQUE: Multidetector CT imaging of the head, cervical spine, and maxillofacial structures were performed using the standard protocol without intravenous contrast. Multiplanar CT image reconstructions of the cervical spine and maxillofacial structures were also generated. RADIATION DOSE REDUCTION: This exam was performed according to the departmental dose-optimization program which includes automated exposure control, adjustment of the mA and/or kV according to patient size and/or use of iterative reconstruction  technique. COMPARISON:  03/05/2022 FINDINGS: CT HEAD FINDINGS Brain: No evidence of acute infarction, hemorrhage, hydrocephalus, extra-axial collection or mass lesion/mass effect. Mild periventricular white matter hypodensity. Vascular: No hyperdense vessel or unexpected calcification. CT FACIAL BONES FINDINGS Skull: Normal. Negative for fracture or focal lesion. Facial bones: No displaced fractures or dislocations. Sinuses/Orbits: No acute finding. Other: None. CT CERVICAL SPINE FINDINGS Alignment: Normal. Skull base and vertebrae: No acute fracture. No primary bone lesion or focal pathologic process. Soft tissues and spinal canal: No prevertebral fluid or swelling. No visible canal hematoma. Disc levels: Moderate multilevel cervical disc degenerative disease and osteophytosis. Upper chest: Negative. Other: None. IMPRESSION: 1. No acute intracranial pathology. Small-vessel white matter disease. 2. No displaced fractures or dislocations of the facial bones. 3. No fracture or static subluxation of the cervical spine. 4. Moderate multilevel cervical disc degenerative disease and osteophytosis. Electronically Signed   By: Marolyn JONETTA Jaksch M.D.   On: 07/20/2023 11:29   CT MAXILLOFACIAL WO CONTRAST Result Date: 07/20/2023 CLINICAL DATA:  Fall last night, hit face on fire extinguisher EXAM: CT HEAD WITHOUT CONTRAST CT MAXILLOFACIAL WITHOUT CONTRAST CT CERVICAL SPINE WITHOUT  CONTRAST TECHNIQUE: Multidetector CT imaging of the head, cervical spine, and maxillofacial structures were performed using the standard protocol without intravenous contrast. Multiplanar CT image reconstructions of the cervical spine and maxillofacial structures were also generated. RADIATION DOSE REDUCTION: This exam was performed according to the departmental dose-optimization program which includes automated exposure control, adjustment of the mA and/or kV according to patient size and/or use of iterative reconstruction technique. COMPARISON:  03/05/2022 FINDINGS: CT HEAD FINDINGS Brain: No evidence of acute infarction, hemorrhage, hydrocephalus, extra-axial collection or mass lesion/mass effect. Mild periventricular white matter hypodensity. Vascular: No hyperdense vessel or unexpected calcification. CT FACIAL BONES FINDINGS Skull: Normal. Negative for fracture or focal lesion. Facial bones: No displaced fractures or dislocations. Sinuses/Orbits: No acute finding. Other: None. CT CERVICAL SPINE FINDINGS Alignment: Normal. Skull base and vertebrae: No acute fracture. No primary bone lesion or focal pathologic process. Soft tissues and spinal canal: No prevertebral fluid or swelling. No visible canal hematoma. Disc levels: Moderate multilevel cervical disc degenerative disease and osteophytosis. Upper chest: Negative. Other: None. IMPRESSION: 1. No acute intracranial pathology. Small-vessel white matter disease. 2. No displaced fractures or dislocations of the facial bones. 3. No fracture or static subluxation of the cervical spine. 4. Moderate multilevel cervical disc degenerative disease and osteophytosis. Electronically Signed   By: Marolyn JONETTA Jaksch M.D.   On: 07/20/2023 11:29   CT Cervical Spine Wo Contrast Result Date: 07/20/2023 CLINICAL DATA:  Fall last night, hit face on fire extinguisher EXAM: CT HEAD WITHOUT CONTRAST CT MAXILLOFACIAL WITHOUT CONTRAST CT CERVICAL SPINE WITHOUT CONTRAST TECHNIQUE:  Multidetector CT imaging of the head, cervical spine, and maxillofacial structures were performed using the standard protocol without intravenous contrast. Multiplanar CT image reconstructions of the cervical spine and maxillofacial structures were also generated. RADIATION DOSE REDUCTION: This exam was performed according to the departmental dose-optimization program which includes automated exposure control, adjustment of the mA and/or kV according to patient size and/or use of iterative reconstruction technique. COMPARISON:  03/05/2022 FINDINGS: CT HEAD FINDINGS Brain: No evidence of acute infarction, hemorrhage, hydrocephalus, extra-axial collection or mass lesion/mass effect. Mild periventricular white matter hypodensity. Vascular: No hyperdense vessel or unexpected calcification. CT FACIAL BONES FINDINGS Skull: Normal. Negative for fracture or focal lesion. Facial bones: No displaced fractures or dislocations. Sinuses/Orbits: No acute finding. Other: None. CT CERVICAL SPINE FINDINGS Alignment: Normal. Skull base and vertebrae:  No acute fracture. No primary bone lesion or focal pathologic process. Soft tissues and spinal canal: No prevertebral fluid or swelling. No visible canal hematoma. Disc levels: Moderate multilevel cervical disc degenerative disease and osteophytosis. Upper chest: Negative. Other: None. IMPRESSION: 1. No acute intracranial pathology. Small-vessel white matter disease. 2. No displaced fractures or dislocations of the facial bones. 3. No fracture or static subluxation of the cervical spine. 4. Moderate multilevel cervical disc degenerative disease and osteophytosis. Electronically Signed   By: Marolyn JONETTA Jaksch M.D.   On: 07/20/2023 11:29    Procedures Procedures    Medications Ordered in ED Medications - No data to display  ED Course/ Medical Decision Making/ A&P                                 Medical Decision Making This patient presents to the ED for concern of fall, this  involves an extensive number of treatment options, and is a complaint that carries with it a high risk of complications and morbidity.  The differential diagnosis includes SAH, ICH, cranial fracture, orbital fracture, concussion  My initial workup includes imaging.  Patient declines pain medication  Additional history obtained from: Nursing notes from this visit.  I ordered imaging studies including CT head, maxillofacial, C-spine I independently visualized and interpreted imaging which showed no acute intracranial, maxillofacial or cervical spine abnormalities I agree with the radiologist interpretation  Afebrile, hemodynamically stable.  73 year old female presenting to the ED for evaluation of a fall that occurred yesterday, approximately 12 hours prior to arrival.  She reports potential loss of consciousness but is unsure as she lives at home alone.  She reports mild tenderness to palpation of the left temple and some bruising.  She denies other symptoms.  She appears very well on physical exam.  No focal neurologic deficits.  She is on Eliquis .  CT head, maxillofacial and cervical spine without acute abnormalities.  Suspect contusion of the face.  She has an appointment with her primary care provider on Monday.  She was encouraged to keep this appointment.  She was given return precautions.  Stable discharge.  At this time there does not appear to be any evidence of an acute emergency medical condition and the patient appears stable for discharge with appropriate outpatient follow up. Diagnosis was discussed with patient who verbalizes understanding of care plan and is agreeable to discharge. I have discussed return precautions with patient who verbalizes understanding. Patient encouraged to follow-up with their PCP within 1 week. All questions answered.  Patient's case discussed with Dr. Dreama who agrees with plan to discharge with follow-up.   Note: Portions of this report may have been  transcribed using voice recognition software. Every effort was made to ensure accuracy; however, inadvertent computerized transcription errors may still be present.         Final Clinical Impression(s) / ED Diagnoses Final diagnoses:  Fall, initial encounter  Contusion of face, initial encounter    Rx / DC Orders ED Discharge Orders     None         Edwardo Marsa HERO, DEVONNA 07/20/23 1143    Dreama Longs, MD 07/20/23 [id number]

## 2023-07-20 NOTE — Discharge Instructions (Signed)
 You have been seen today for your complaint of fall, facial pain. Your imaging was reassuring and showed no abnormalities. Your discharge medications include Tylenol .  Take 650 mg as needed, up to 4 times per day for pain. Follow up with: Your primary care provider as scheduled Please seek immediate medical care if you develop any of the following symptoms: Lose consciousness or have trouble moving after a fall. Have a fall that causes a head injury. At this time there does not appear to be the presence of an emergent medical condition, however there is always the potential for conditions to change. Please read and follow the below instructions.  Do not take your medicine if  develop an itchy rash, swelling in your mouth or lips, or difficulty breathing; call 911 and seek immediate emergency medical attention if this occurs.  You may review your lab tests and imaging results in their entirety on your MyChart account.  Please discuss all results of fully with your primary care provider and other specialist at your follow-up visit.  Note: Portions of this text may have been transcribed using voice recognition software. Every effort was made to ensure accuracy; however, inadvertent computerized transcription errors may still be present.

## 2023-07-20 NOTE — ED Triage Notes (Signed)
 Pt was on a step stool getting some snow shoes down and fell off the step stool hitting her head on a fire extinguisher, pt states she thinks she passed out after the fall and hitting her head. Pt C/o head pain, pt is on eliquis.

## 2023-07-22 ENCOUNTER — Encounter: Payer: Self-pay | Admitting: Internal Medicine

## 2023-07-23 ENCOUNTER — Encounter: Payer: Self-pay | Admitting: Internal Medicine

## 2023-07-23 NOTE — Patient Instructions (Addendum)
    Tetanus given today    Blood work was ordered.       Medications changes include :   increase venlafaxine  to 225 mg daily    A referral was ordered neuropsychology  - Dr Richie and someone will call you to schedule an appointment.     Return in about 6 months (around 01/21/2024) for follow up.

## 2023-07-23 NOTE — Progress Notes (Signed)
 Subjective:    Patient ID: Julia [name], female    DOB: August 24, 1950, 73 y.o.   MRN: [id number]     HPI Julia [name] is here for follow up of her chronic medical problems.  ED 1/9 after fall at home - was on a stepstool and lost her balance and fell.  Struck left side of head on government social research officer.  Possible 5 min LOC.  In ED denied HA, neck pain, back pain - had some left temple pain.  CT head, neck and maxillofacial all neg for fracture/acute injury.  Also with small cut on right anterior hand.  This wound did bleed last night.  She denies discharge.    Working out regularly with psychologist, educational.     Still drinking more than she should - most nights has 2-3 glasses of wine - does light wine.  Started taking naltrexone .  She does feel lonely.  She thinks that might influence some of the drinking.  Overall she feels good, but there might be some mild depression.    Medications and allergies reviewed with patient and updated if appropriate.  Current Outpatient Medications on File Prior to Visit  Medication Sig Dispense Refill   amLODipine  (NORVASC ) 2.5 MG tablet TAKE 1 TABLET(2.5 MG) BY MOUTH DAILY 90 tablet 3   apixaban  (ELIQUIS ) 5 MG TABS tablet TAKE 1 TABLET(5 MG) BY MOUTH TWICE DAILY 180 tablet 1   Ascorbic Acid (VITAMIN C PO) Take 3,200 mg by mouth daily. 1600 mg each     Calcium Citrate-Vitamin D  (CALCIUM + D PO) Take 1 capsule by mouth 1 day or 1 dose.     Cholecalciferol (VITAMIN D3) 250 MCG (10000 UT) TABS Take 5,000 Units by mouth 2 (two) times daily.      Coenzyme Q10 (COQ-10) 100 MG CAPS Take 200 mg by mouth daily.     Krill Oil 1000 MG CAPS Take 1,000 mg by mouth daily.     levothyroxine  (SYNTHROID ) 25 MCG tablet TAKE 1 TABLET(25 MCG) BY MOUTH DAILY 90 tablet 3   Misc Natural Products ([employer] GLUCOS-CHONDROIT-MSM TS) TABS Take 2 tablets by mouth at bedtime.     Multiple Vitamins-Minerals (CENTRUM SILVER PO) Take 1 tablet by mouth daily.      naltrexone  (DEPADE) 50 MG tablet Take by  mouth daily.     OZEMPIC, 2 MG/DOSE, 8 MG/3ML SOPN Inject 2 mg into the skin once a week.     riTUXimab  (RITUXAN ) 500 MG/50ML injection Inject 10 mg into the vein every 6 (six) months.     triamcinolone  ointment (KENALOG ) 0.5 % APPLY TOPICALLY TO THE AFFECTED AREA TWICE DAILY 30 g 0   valACYclovir  (VALTREX ) 500 MG tablet TAKE 1 TABLET(500 MG) BY MOUTH DAILY 90 tablet 1   venlafaxine  XR (EFFEXOR -XR) 150 MG 24 hr capsule TAKE 1 CAPSULE(150 MG) BY MOUTH DAILY WITH BREAKFAST 90 capsule 3   ondansetron  (ZOFRAN ) 4 MG tablet      Current Facility-Administered Medications on File Prior to Visit  Medication Dose Route Frequency Provider Last Rate Last Admin   sodium chloride  flush (NS) 0.9 % injection 10 mL  10 mL Intravenous PRN Sebastian Collar R, PA-C   20 mL at 07/16/21 1025     Review of Systems  Constitutional:  Negative for fever.  Respiratory:  Negative for cough, shortness of breath and wheezing.   Cardiovascular:  Negative for chest pain, palpitations and leg swelling.  Skin:  Positive for wound (right hand).  Neurological:  Negative for  light-headedness and headaches.       Objective:   Vitals:   07/24/23 1031  BP: 118/70  Pulse: 70  Temp: 98 F (36.7 C)  SpO2: 99%   BP Readings from Last 3 Encounters:  07/24/23 118/70  07/20/23 129/68  03/10/23 120/80   Wt Readings from Last 3 Encounters:  07/24/23 129 lb (58.5 kg)  03/10/23 141 lb 9.6 oz (64.2 kg)  02/08/23 142 lb (64.4 kg)   Body mass index is 22.85 kg/m.    Physical Exam Constitutional:      General: She is not in acute distress.    Appearance: Normal appearance.  HENT:     Head: Normocephalic and atraumatic.  Eyes:     Conjunctiva/sclera: Conjunctivae normal.  Cardiovascular:     Rate and Rhythm: Normal rate and regular rhythm.     Heart sounds: Normal heart sounds.  Pulmonary:     Effort: Pulmonary effort is normal. No respiratory distress.     Breath sounds: Normal breath sounds. No wheezing.   Musculoskeletal:     Cervical back: Neck supple.     Right lower leg: No edema.     Left lower leg: No edema.  Lymphadenopathy:     Cervical: No cervical adenopathy.  Skin:    General: Skin is warm and dry.     Findings: No rash.     Comments: Small open wound anterior right medial palm-no active bleeding or discharge.  Neurological:     Mental Status: She is alert. Mental status is at baseline.  Psychiatric:        Mood and Affect: Mood normal.        Behavior: Behavior normal.        Lab Results  Component Value Date   WBC 4.0 01/09/2023   HGB 13.3 01/09/2023   HCT 40.6 01/09/2023   PLT 319.0 01/09/2023   GLUCOSE 84 01/09/2023   CHOL 201 (H) 01/09/2023   TRIG 56.0 01/09/2023   HDL 117.80 01/09/2023   LDLCALC 72 01/09/2023   ALT 23 01/09/2023   AST 32 01/09/2023   NA 133 (L) 01/09/2023   K 4.4 01/09/2023   CL 95 (L) 01/09/2023   CREATININE 0.53 01/09/2023   BUN 12 01/09/2023   CO2 31 01/09/2023   TSH 1.62 01/09/2023   INR 1.0 02/14/2020   HGBA1C 4.9 01/09/2023     Assessment & Plan:    See Problem List for Assessment and Plan of chronic medical problems.   Td today

## 2023-07-24 ENCOUNTER — Ambulatory Visit (INDEPENDENT_AMBULATORY_CARE_PROVIDER_SITE_OTHER): Payer: Medicare Other | Admitting: Internal Medicine

## 2023-07-24 ENCOUNTER — Encounter: Payer: Self-pay | Admitting: Psychology

## 2023-07-24 VITALS — BP 118/70 | HR 70 | Temp 98.0°F | Ht 63.0 in | Wt 129.0 lb

## 2023-07-24 DIAGNOSIS — R4189 Other symptoms and signs involving cognitive functions and awareness: Secondary | ICD-10-CM | POA: Insufficient documentation

## 2023-07-24 DIAGNOSIS — E039 Hypothyroidism, unspecified: Secondary | ICD-10-CM | POA: Diagnosis not present

## 2023-07-24 DIAGNOSIS — F1010 Alcohol abuse, uncomplicated: Secondary | ICD-10-CM

## 2023-07-24 DIAGNOSIS — M069 Rheumatoid arthritis, unspecified: Secondary | ICD-10-CM

## 2023-07-24 DIAGNOSIS — M810 Age-related osteoporosis without current pathological fracture: Secondary | ICD-10-CM

## 2023-07-24 DIAGNOSIS — S61411A Laceration without foreign body of right hand, initial encounter: Secondary | ICD-10-CM | POA: Diagnosis not present

## 2023-07-24 DIAGNOSIS — I10 Essential (primary) hypertension: Secondary | ICD-10-CM | POA: Diagnosis not present

## 2023-07-24 DIAGNOSIS — R413 Other amnesia: Secondary | ICD-10-CM

## 2023-07-24 DIAGNOSIS — Z23 Encounter for immunization: Secondary | ICD-10-CM

## 2023-07-24 DIAGNOSIS — I480 Paroxysmal atrial fibrillation: Secondary | ICD-10-CM | POA: Diagnosis not present

## 2023-07-24 DIAGNOSIS — F4323 Adjustment disorder with mixed anxiety and depressed mood: Secondary | ICD-10-CM

## 2023-07-24 HISTORY — DX: Other symptoms and signs involving cognitive functions and awareness: R41.89

## 2023-07-24 HISTORY — DX: Laceration without foreign body of right hand, initial encounter: S61.411A

## 2023-07-24 MED ORDER — TETANUS-DIPHTHERIA TOXOIDS TD 5-2 LFU IM INJ: 0.5000 mL | INJECTION | Freq: Once | INTRAMUSCULAR | 0 refills | Status: DC

## 2023-07-24 MED ORDER — VENLAFAXINE HCL ER 75 MG PO CP24: 75.0000 mg | ORAL_CAPSULE | Freq: Every day | ORAL | 1 refills | Status: DC

## 2023-07-24 NOTE — Assessment & Plan Note (Signed)
 Chronic Paroxysmal Following with cardiology On Eliquis 5 mg twice daily Rate controlled CMP, CBC

## 2023-07-24 NOTE — Addendum Note (Signed)
 Addended by: Karma Ganja on: 07/24/2023 01:04 PM   Modules accepted: Orders

## 2023-07-24 NOTE — Assessment & Plan Note (Signed)
 She admits to drinking more alcohol  than she should Typically drinks wine at night and may have 2-3 drinks at night-she is drinking light wine Slight depression and definite loneliness are likely influencing her alcohol  intake Will adjust Effexor  dose which may help She did start naltrexone  which we will hold fully help curb her cravings She is trying to be as active socially as possible

## 2023-07-24 NOTE — Assessment & Plan Note (Addendum)
 Chronic She does have some mild depression which is likely related to loneliness This may be influencing her drinking at night-decrease alcohol intake Increase Effexor to 225 mg daily No longer seeing the therapist

## 2023-07-24 NOTE — Assessment & Plan Note (Signed)
 Chronic Lab Results  Component Value Date   TSH 1.62 01/09/2023   Clinically euthyroid Check TSH Continue levothyroxine  25 mcg daily

## 2023-07-24 NOTE — Assessment & Plan Note (Signed)
 Has noticed more issues with memory - concerned about beginning of memory issues - would like to be retested Saw Dr Milbert Coulter in the past - testing was reassuring Referral to Dr Milbert Coulter for retesting

## 2023-07-24 NOTE — Addendum Note (Signed)
 Addended by: Karma Ganja on: 07/24/2023 01:06 PM   Modules accepted: Orders

## 2023-07-24 NOTE — Assessment & Plan Note (Signed)
 Chronic Blood pressure is well-controlled CMP-CBC Continue amlodipine 2.5 mg daily

## 2023-07-24 NOTE — Assessment & Plan Note (Addendum)
 Chronic Monitored by her rheumatologist at [facility] Check vitamin D level Continue regular exercise Continue calcium and vitamin D Overdue for Prolia-will check with insurance and get her scheduled ASAP

## 2023-07-24 NOTE — Assessment & Plan Note (Signed)
 Acute Related to recent fall No evidence of infection Was bleeding last night, but no active bleeding today Wound is still open Tetanus due Td today She will keep wound covered and will monitor healing progress

## 2023-07-24 NOTE — Assessment & Plan Note (Signed)
 Chronic Controlled Management per [facility] rheumatology On Rituxan

## 2023-07-27 ENCOUNTER — Other Ambulatory Visit: Payer: Self-pay | Admitting: Internal Medicine

## 2023-07-27 MED ORDER — TRIAMCINOLONE ACETONIDE 0.5 % EX OINT: TOPICAL_OINTMENT | Freq: Two times a day (BID) | CUTANEOUS | 2 refills | Status: AC

## 2023-08-09 ENCOUNTER — Emergency Department (HOSPITAL_BASED_OUTPATIENT_CLINIC_OR_DEPARTMENT_OTHER)
Admission: EM | Admit: 2023-08-09 | Discharge: 2023-08-09 | Disposition: A | Discharge status: Home / Self Care | Payer: Medicare Other | Attending: Emergency Medicine | Admitting: Emergency Medicine

## 2023-08-09 ENCOUNTER — Other Ambulatory Visit: Payer: Self-pay

## 2023-08-09 ENCOUNTER — Emergency Department (HOSPITAL_BASED_OUTPATIENT_CLINIC_OR_DEPARTMENT_OTHER): Payer: Medicare Other

## 2023-08-09 DIAGNOSIS — W19XXXA Unspecified fall, initial encounter: Secondary | ICD-10-CM | POA: Diagnosis not present

## 2023-08-09 DIAGNOSIS — S0101XA Laceration without foreign body of scalp, initial encounter: Secondary | ICD-10-CM | POA: Insufficient documentation

## 2023-08-09 DIAGNOSIS — M542 Cervicalgia: Secondary | ICD-10-CM | POA: Diagnosis not present

## 2023-08-09 DIAGNOSIS — S60512A Abrasion of left hand, initial encounter: Secondary | ICD-10-CM | POA: Diagnosis not present

## 2023-08-09 DIAGNOSIS — Y92009 Unspecified place in unspecified non-institutional (private) residence as the place of occurrence of the external cause: Secondary | ICD-10-CM | POA: Diagnosis not present

## 2023-08-09 DIAGNOSIS — S0990XA Unspecified injury of head, initial encounter: Secondary | ICD-10-CM | POA: Diagnosis not present

## 2023-08-09 DIAGNOSIS — Z7901 Long term (current) use of anticoagulants: Secondary | ICD-10-CM | POA: Diagnosis not present

## 2023-08-09 DIAGNOSIS — S60212A Contusion of left wrist, initial encounter: Secondary | ICD-10-CM | POA: Diagnosis not present

## 2023-08-09 DIAGNOSIS — Y9301 Activity, walking, marching and hiking: Secondary | ICD-10-CM | POA: Diagnosis not present

## 2023-08-09 DIAGNOSIS — R519 Headache, unspecified: Secondary | ICD-10-CM | POA: Diagnosis not present

## 2023-08-09 DIAGNOSIS — Z23 Encounter for immunization: Secondary | ICD-10-CM | POA: Diagnosis not present

## 2023-08-09 NOTE — ED Provider Notes (Signed)
EMERGENCY DEPARTMENT AT [facility] Provider Note   CSN: [id number] Arrival date & time: 08/09/23  1053     History  Chief Complaint  Patient presents with   Julia [name] is a 73 y.o. female.  She had a medical fall 4 days ago walking into her house.  She said hit the back of her head but did not lose consciousness, had significant bleeding.  She was evaluated by rescue squad but did not ultimately come to the emergency department for evaluation.  Since then she has had pain in the back of her scalp and her left side of her neck along with feeling more tired.  She also injured her left wrist and hand but that is gotten better and she is not worried about that.  She said she had a head injury about a month ago and was here and got CAT scans at that time.  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  Head Injury Location:  Occipital Time since incident:  3 days Mechanism of injury: fall   Fall:    Fall occurred:  Down stairs Associated symptoms: headache and neck pain   Associated symptoms: no loss of consciousness, no nausea and no vomiting        Home Medications Prior to Admission medications   Medication Sig Start Date End Date Taking? Authorizing Provider  amLODipine (NORVASC) 2.5 MG tablet TAKE 1 TABLET(2.5 MG) BY MOUTH DAILY 04/17/23   Pincus Sanes, MD  apixaban (ELIQUIS) 5 MG TABS tablet TAKE 1 TABLET(5 MG) BY MOUTH TWICE DAILY 05/31/23   Tonny Bollman, MD  Ascorbic Acid (VITAMIN C PO) Take 3,200 mg by mouth daily. 1600 mg each    [provider]  Calcium Citrate-Vitamin D (CALCIUM + D PO) Take 1 capsule by mouth 1 day or 1 dose.    [provider]  Cholecalciferol (VITAMIN D3) 250 MCG (10000 UT) TABS Take 5,000 Units by mouth 2 (two) times daily.     [provider]  Coenzyme Q10 (COQ-10) 100 MG CAPS Take 200 mg by mouth daily.    [provider]  Boris Lown  Oil 1000 MG CAPS Take 1,000 mg by mouth daily.    [provider]  levothyroxine (SYNTHROID) 25 MCG tablet TAKE 1 TABLET(25 MCG) BY MOUTH DAILY 10/17/22   Burns, Bobette Mo, MD  Misc Natural Products ([employer] GLUCOS-CHONDROIT-MSM TS) TABS Take 2 tablets by mouth at bedtime.    [provider]  Multiple Vitamins-Minerals (CENTRUM SILVER PO) Take 1 tablet by mouth daily.     [provider]  naltrexone (DEPADE) 50 MG tablet Take by mouth daily.    [provider]  ondansetron (ZOFRAN) 4 MG tablet     [provider]  OZEMPIC, 2 MG/DOSE, 8 MG/3ML SOPN Inject 2 mg into the skin once a week. 01/12/22   [provider]  riTUXimab (RITUXAN) 500 MG/50ML injection Inject 10 mg into the vein every 6 (six) months.    [provider]  triamcinolone ointment (KENALOG) 0.5 % Apply topically 2 (two) times daily. APPLY TOPICALLY TO THE AFFECTED AREA TWICE DAILY 07/27/23   Pincus Sanes, MD  valACYclovir (VALTREX) 500 MG tablet TAKE 1 TABLET(500 MG) BY MOUTH DAILY 03/23/23   Pincus Sanes, MD  venlafaxine XR (EFFEXOR-XR) 150 MG 24 hr capsule TAKE 1 CAPSULE(150 MG) BY MOUTH DAILY WITH  BREAKFAST 05/19/23   Pincus Sanes, MD  venlafaxine XR (EFFEXOR-XR) 75 MG 24 hr capsule Take 1 capsule (75 mg total) by mouth daily with breakfast. 07/24/23   Pincus Sanes, MD      Allergies    Patient has no known allergies.    Review of Systems   Review of Systems  Gastrointestinal:  Negative for nausea and vomiting.  Musculoskeletal:  Positive for neck pain.  Neurological:  Positive for headaches. Negative for loss of consciousness.    Physical Exam Updated Vital Signs BP 125/74 (BP Location: Left Arm)   Pulse 78   Temp 98 F (36.7 C)   Resp 18   SpO2 99%  Physical Exam Vitals and nursing note reviewed.  Constitutional:      General: She is not in acute distress.    Appearance: Normal appearance. She is well-developed.  HENT:     Head: Normocephalic.      Comments: She is tender on her occipital scalp and there is a healing laceration.  No signs of infection Eyes:     Conjunctiva/sclera: Conjunctivae normal.  Cardiovascular:     Rate and Rhythm: Normal rate and regular rhythm.     Heart sounds: No murmur heard. Pulmonary:     Effort: Pulmonary effort is normal. No respiratory distress.     Breath sounds: Normal breath sounds.  Abdominal:     Palpations: Abdomen is soft.     Tenderness: There is no abdominal tenderness.  Musculoskeletal:        General: Signs of injury present.     Cervical back: Neck supple.     Comments: She has a few abrasions on her dorsum left hand and left wrist.  Full range of motion without any bony tenderness.  Skin:    General: Skin is warm and dry.     Capillary Refill: Capillary refill takes less than 2 seconds.  Neurological:     General: No focal deficit present.     Mental Status: She is alert and oriented to person, place, and time.     Cranial Nerves: No cranial nerve deficit.     Sensory: No sensory deficit.     Motor: No weakness.     Gait: Gait normal.     ED Results / Procedures / Treatments   Labs (all labs ordered are listed, but only abnormal results are displayed) Labs Reviewed - No data to display  EKG None  Radiology CT Head Wo Contrast Result Date: 08/09/2023 CLINICAL DATA:  Fall on Sunday, on blood thinners, hit the back of her head, headache and sleeping more than normal, facial bruising EXAM: CT HEAD WITHOUT CONTRAST CT CERVICAL SPINE WITHOUT CONTRAST TECHNIQUE: Multidetector CT imaging of the head and cervical spine was performed following the standard protocol without intravenous contrast. Multiplanar CT image reconstructions of the cervical spine were also generated. RADIATION DOSE REDUCTION: This exam was performed according to the departmental dose-optimization program which includes automated exposure control, adjustment of the mA and/or kV according to patient size and/or use  of iterative reconstruction technique. COMPARISON:  07/20/2023 CT head and cervical spine FINDINGS: CT HEAD FINDINGS Brain: No evidence of acute infarct, hemorrhage, mass, mass effect, or midline shift. No hydrocephalus or extra-axial fluid collection. Vascular: No hyperdense vessel. Skull: Negative for fracture or focal lesion. Sinuses/Orbits: No acute finding. Other: The mastoid air cells are well aerated. CT CERVICAL SPINE FINDINGS Alignment: No traumatic listhesis. Skull base and vertebrae: No acute fracture or suspicious osseous  lesion. Soft tissues and spinal canal: No prevertebral fluid or swelling. No visible canal hematoma. Disc levels: Degenerative changes in the cervical spine.No high-grade spinal canal stenosis. Upper chest: No focal pulmonary opacity or pleural effusion. IMPRESSION: 1. No acute intracranial process. 2. No acute fracture or traumatic listhesis in the cervical spine. Electronically Signed   By: Wiliam Ke M.D.   On: 08/09/2023 12:43   CT Cervical Spine Wo Contrast Result Date: 08/09/2023 CLINICAL DATA:  Fall on Sunday, on blood thinners, hit the back of her head, headache and sleeping more than normal, facial bruising EXAM: CT HEAD WITHOUT CONTRAST CT CERVICAL SPINE WITHOUT CONTRAST TECHNIQUE: Multidetector CT imaging of the head and cervical spine was performed following the standard protocol without intravenous contrast. Multiplanar CT image reconstructions of the cervical spine were also generated. RADIATION DOSE REDUCTION: This exam was performed according to the departmental dose-optimization program which includes automated exposure control, adjustment of the mA and/or kV according to patient size and/or use of iterative reconstruction technique. COMPARISON:  07/20/2023 CT head and cervical spine FINDINGS: CT HEAD FINDINGS Brain: No evidence of acute infarct, hemorrhage, mass, mass effect, or midline shift. No hydrocephalus or extra-axial fluid collection. Vascular: No  hyperdense vessel. Skull: Negative for fracture or focal lesion. Sinuses/Orbits: No acute finding. Other: The mastoid air cells are well aerated. CT CERVICAL SPINE FINDINGS Alignment: No traumatic listhesis. Skull base and vertebrae: No acute fracture or suspicious osseous lesion. Soft tissues and spinal canal: No prevertebral fluid or swelling. No visible canal hematoma. Disc levels: Degenerative changes in the cervical spine.No high-grade spinal canal stenosis. Upper chest: No focal pulmonary opacity or pleural effusion. IMPRESSION: 1. No acute intracranial process. 2. No acute fracture or traumatic listhesis in the cervical spine. Electronically Signed   By: Wiliam Ke M.D.   On: 08/09/2023 12:43    Procedures Procedures    Medications Ordered in ED Medications - No data to display  ED Course/ Medical Decision Making/ A&P Clinical Course as of 08/09/23 1726  Wed Aug 09, 2023  1127 Due to fall on thinners getting CAT scan of her head and her cervical spine.  She is declining x-rays of her left wrist and hand. [MB]  1243 CT head and cervical spine did not show any obvious traumatic findings.  Awaiting radiology reading.111 [MB]    Clinical Course User Index [MB] Terrilee Files, MD                                 Medical Decision Making Amount and/or Complexity of Data Reviewed Radiology: ordered.   This patient complains of fall head injury lethargy; this involves an extensive number of treatment Options and is a complaint that carries with it a high risk of complications and morbidity. The differential includes stroke, bleed, fracture, concussion I ordered imaging studies which included CT head and cervical spine and I independently    visualized and interpreted imaging which showed no acute traumatic findings Previous records obtained and reviewed in epic including recent ED visits Social determinants considered, no significant barriers Critical Interventions: None  After  the interventions stated above, I reevaluated the patient and found patient to be awake alert neuro intact Admission and further testing considered, no indications for admission at this time.  Given return instructions and recommended close follow-up with PCP.  Return instructions discussed         Final Clinical Impression(s) / ED Diagnoses  Final diagnoses:  Fall, initial encounter  Injury of head, initial encounter  Contusion of left wrist, initial encounter    Rx / DC Orders ED Discharge Orders     None         Terrilee Files, MD 08/09/23 1728

## 2023-08-09 NOTE — ED Notes (Signed)
Reviewed discharge instructions and follow up with pt. Information provided on head injury and concussion. Pt states understanding

## 2023-08-09 NOTE — Discharge Instructions (Signed)
You were seen in the emergency department for evaluation of injuries from a fall.  He had a CAT scan of your head and cervical spine that did not show any traumatic findings.  Please rest and drink plenty of fluids.  Tylenol for pain.  Follow-up with your primary care doctor.  Return to the emergency department if any worsening or concerning symptoms

## 2023-08-09 NOTE — ED Triage Notes (Signed)
Mechanical fall on Sunday. Hit back of head. Denies LOC.Takes blood thinner (Eliquis) States had no symptoms at time of falll but now has developed headache and "sleeping more than normal". Patient's had bruising in face.  No unilateral or focal deficits.

## 2023-08-10 ENCOUNTER — Telehealth: Payer: Self-pay

## 2023-08-10 NOTE — Telephone Encounter (Signed)
MyChart message to patient to get her scheduled for 2nd Hep A the week of Feb 10th

## 2023-08-10 NOTE — Transitions of Care (Post Inpatient/ED Visit) (Signed)
08/10/2023  Name: Julia [name] MRN: [id number] DOB: [birth year]  Today's TOC FU Call Status: Today's TOC FU Call Status:: Successful TOC FU Call Completed TOC FU Call Complete Date: 08/10/23 Patient's Name and Date of Birth confirmed.  Transition Care Management Follow-up Telephone Call Date of Discharge: 08/09/23 Discharge Facility: [facility] Type of Discharge: Emergency Department Reason for ED Visit: Other: (contusion) How have you been since you were released from the hospital?: Better Any questions or concerns?: No  Items Reviewed: Did you receive and understand the discharge instructions provided?: Yes Medications obtained,verified, and reconciled?: Yes (Medications Reviewed) Any new allergies since your discharge?: No Dietary orders reviewed?: NA Do you have support at home?: Yes People in Home: spouse  Medications Reviewed Today: Medications Reviewed Today     Reviewed by Karena Addison, LPN (Licensed Practical Nurse) on 08/10/23 at 1529  Med List Status: <None>   Medication Order Taking? Sig Documenting Provider Last Dose Status Informant  amLODipine (NORVASC) 2.5 MG tablet [id number] No TAKE 1 TABLET(2.5 MG) BY MOUTH DAILY Burns, Bobette Mo, MD Taking Active   apixaban (ELIQUIS) 5 MG TABS tablet [id number] No TAKE 1 TABLET(5 MG) BY MOUTH TWICE DAILY Tonny Bollman, MD Taking Active   Ascorbic Acid (VITAMIN C PO) [id number] No Take 3,200 mg by mouth daily. 1600 mg each [provider] Taking Active Self           Med Note Anselm Pancoast R   Fri Mar 10, 2023  1:51 PM)    Calcium Citrate-Vitamin D (CALCIUM + D PO) [id number] No Take 1 capsule by mouth 1 day or 1 dose. [provider] Taking Active Self           Med Note Joella Prince A   Mon May 25, 2020  9:21 AM)    Cholecalciferol (VITAMIN D3) 250 MCG (10000 UT) TABS [id number] No Take 5,000 Units by mouth 2 (two) times daily.  [provider] Taking Active Self  Coenzyme Q10  (COQ-10) 100 MG CAPS [id number] No Take 200 mg by mouth daily. [provider] Taking Active Self  Krill Oil 1000 MG CAPS [id number] No Take 1,000 mg by mouth daily. [provider] Taking Active Self  levothyroxine (SYNTHROID) 25 MCG tablet [id number] No TAKE 1 TABLET(25 MCG) BY MOUTH DAILY Burns, Bobette Mo, MD Taking Active   Misc Natural Products ([employer] GLUCOS-CHONDROIT-MSM TS) TABS [id number] No Take 2 tablets by mouth at bedtime. [provider] Taking Active Self  Multiple Vitamins-Minerals (CENTRUM SILVER PO) [id number] No Take 1 tablet by mouth daily.  [provider] Taking Active Self  naltrexone (DEPADE) 50 MG tablet [id number] No Take by mouth daily. [provider] Taking Active   ondansetron (ZOFRAN) 4 MG tablet [id number]   [provider]  Active   OZEMPIC, 2 MG/DOSE, 8 MG/3ML SOPN [id number] No Inject 2 mg into the skin once a week. [provider] Taking Active   riTUXimab (RITUXAN) 500 MG/50ML injection [id number] No Inject 10 mg into the vein every 6 (six) months. [provider] Taking Active   triamcinolone ointment (KENALOG) 0.5 % [id number]  Apply topically 2 (two) times daily. APPLY TOPICALLY TO THE AFFECTED AREA TWICE DAILY Burns, Bobette Mo, MD  Active   valACYclovir (VALTREX) 500 MG tablet [id number] No TAKE 1 TABLET(500 MG) BY MOUTH DAILY Burns, Bobette Mo, MD Taking Active   venlafaxine XR (EFFEXOR-XR) 150 MG 24 hr capsule [id number] No TAKE 1 CAPSULE(150 MG) BY MOUTH DAILY WITH BREAKFAST Burns, Kennyth Arnold  J, MD Taking Active   venlafaxine XR (EFFEXOR-XR) 75 MG 24 hr capsule [id number]  Take 1 capsule (75 mg total) by mouth daily with breakfast. Pincus Sanes, MD  Active             Home Care and Equipment/Supplies: Were Home Health Services Ordered?: NA Any new equipment or medical supplies ordered?: NA  Functional Questionnaire: Do you need assistance with bathing/showering or dressing?: No Do you need assistance  with meal preparation?: No Do you need assistance with eating?: No Do you have difficulty maintaining continence: No Do you need assistance with getting out of bed/getting out of a chair/moving?: No Do you have difficulty managing or taking your medications?: No  Follow up appointments reviewed: PCP Follow-up appointment confirmed?: Yes Date of PCP follow-up appointment?: 08/18/23 Follow-up Provider: [facility] Follow-up appointment confirmed?: NA Do you need transportation to your follow-up appointment?: No Do you understand care options if your condition(s) worsen?: Yes-patient verbalized understanding    SIGNATURE Karena Addison, LPN [facility] Nurse Health Advisor Direct Dial [phone]

## 2023-08-10 NOTE — Telephone Encounter (Signed)
-----   Message from [facility] sent at 02/13/2023 11:44 AM EDT ----- Regarding: due for 2nd Hep A Remind patient of her 2nd Hep A appointment on 08-21-23

## 2023-08-17 ENCOUNTER — Encounter: Payer: Self-pay | Admitting: Internal Medicine

## 2023-08-17 NOTE — Progress Notes (Signed)
 Subjective:    Patient ID: Julia [name], female    DOB: Dec 06, 1950, 73 y.o.   MRN: [id number]     HPI Julia [name] is here for follow up from the hospital  08/09/23 - fall at home 4 days prior - she hit the back of her head - no LOC, but had significant bleeding.  She was going up the stairs and she just slipped.  Evaluated by EMS but did not go to ED that day.  Since the fall had head pain and left sided neck pain, along with increased fatigue. Also injured her left wrist and hand.  Ct head and c-spine negative for acute injury.  Dx with concussion.  She initially had headaches and lightheadedness/dizziness, but that has resolved.  The posterior head is tender, but improving  Diarrhea x 3 weeks.  Taking imodium.  Mostly liquid with a little stool.    Today had BM.  Took 2 imodium,  took another 1 later.  The Imodium is not working and she wonders what else she can take.  Medications and allergies reviewed with patient and updated if appropriate.  Current Outpatient Medications on File Prior to Visit  Medication Sig Dispense Refill   amLODipine  (NORVASC ) 2.5 MG tablet TAKE 1 TABLET(2.5 MG) BY MOUTH DAILY 90 tablet 3   apixaban  (ELIQUIS ) 5 MG TABS tablet TAKE 1 TABLET(5 MG) BY MOUTH TWICE DAILY 180 tablet 1   Ascorbic Acid (VITAMIN C PO) Take 3,200 mg by mouth daily. 1600 mg each     Calcium Citrate-Vitamin D  (CALCIUM + D PO) Take 1 capsule by mouth 1 day or 1 dose.     Cholecalciferol (VITAMIN D3) 250 MCG (10000 UT) TABS Take 5,000 Units by mouth 2 (two) times daily.      Coenzyme Q10 (COQ-10) 100 MG CAPS Take 200 mg by mouth daily.     Krill Oil 1000 MG CAPS Take 1,000 mg by mouth daily.     levothyroxine  (SYNTHROID ) 25 MCG tablet TAKE 1 TABLET(25 MCG) BY MOUTH DAILY 90 tablet 3   Misc Natural Products ([employer]S GLUCOS-CHONDROIT-MSM TS) TABS Take 2 tablets by mouth at bedtime.     Multiple Vitamins-Minerals (CENTRUM SILVER PO) Take 1 tablet by mouth daily.      naltrexone  (DEPADE) 50 MG  tablet Take by mouth daily.     ondansetron  (ZOFRAN ) 4 MG tablet      OZEMPIC, 2 MG/DOSE, 8 MG/3ML SOPN Inject 2 mg into the skin once a week.     riTUXimab  (RITUXAN ) 500 MG/50ML injection Inject 10 mg into the vein every 6 (six) months.     triamcinolone  ointment (KENALOG ) 0.5 % Apply topically 2 (two) times daily. APPLY TOPICALLY TO THE AFFECTED AREA TWICE DAILY 30 g 2   valACYclovir  (VALTREX ) 500 MG tablet TAKE 1 TABLET(500 MG) BY MOUTH DAILY 90 tablet 1   venlafaxine  XR (EFFEXOR -XR) 150 MG 24 hr capsule TAKE 1 CAPSULE(150 MG) BY MOUTH DAILY WITH BREAKFAST 90 capsule 3   venlafaxine  XR (EFFEXOR -XR) 75 MG 24 hr capsule Take 1 capsule (75 mg total) by mouth daily with breakfast. 90 capsule 1   Current Facility-Administered Medications on File Prior to Visit  Medication Dose Route Frequency Provider Last Rate Last Admin   sodium chloride  flush (NS) 0.9 % injection 10 mL  10 mL Intravenous PRN Sebastian Collar R, PA-C   20 mL at 07/16/21 1025     Review of Systems  Constitutional:  Negative for fever.  Gastrointestinal:  Positive for abdominal pain (cramping with BM) and diarrhea. Negative for blood in stool and nausea.       Occ gerd in morning  Neurological:  Negative for dizziness, light-headedness and headaches.       Objective:   Vitals:   08/18/23 1409  BP: 116/60  Pulse: 67  Temp: 97.9 F (36.6 C)  SpO2: 100%   BP Readings from Last 3 Encounters:  08/18/23 116/60  08/09/23 121/78  07/24/23 118/70   Wt Readings from Last 3 Encounters:  08/18/23 124 lb 12.8 oz (56.6 kg)  07/24/23 129 lb (58.5 kg)  03/10/23 141 lb 9.6 oz (64.2 kg)   Body mass index is 22.11 kg/m.    Physical Exam Constitutional:      General: She is not in acute distress.    Appearance: Normal appearance. She is not ill-appearing.  HENT:     Head: Normocephalic and atraumatic.  Abdominal:     General: There is no distension.     Palpations: Abdomen is soft. There is no mass.     Tenderness:  There is no abdominal tenderness. There is no guarding or rebound.  Skin:    General: Skin is warm and dry.  Neurological:     Mental Status: She is alert.        Lab Results  Component Value Date   WBC 4.0 01/09/2023   HGB 13.3 01/09/2023   HCT 40.6 01/09/2023   PLT 319.0 01/09/2023   GLUCOSE 84 01/09/2023   CHOL 201 (H) 01/09/2023   TRIG 56.0 01/09/2023   HDL 117.80 01/09/2023   LDLCALC 72 01/09/2023   ALT 23 01/09/2023   AST 32 01/09/2023   NA 133 (L) 01/09/2023   K 4.4 01/09/2023   CL 95 (L) 01/09/2023   CREATININE 0.53 01/09/2023   BUN 12 01/09/2023   CO2 31 01/09/2023   TSH 1.62 01/09/2023   INR 1.0 02/14/2020   HGBA1C 4.9 01/09/2023     Assessment & Plan:    See Problem List for Assessment and Plan of chronic medical problems.

## 2023-08-18 ENCOUNTER — Ambulatory Visit (INDEPENDENT_AMBULATORY_CARE_PROVIDER_SITE_OTHER): Payer: Medicare Other | Admitting: Internal Medicine

## 2023-08-18 VITALS — BP 116/60 | HR 67 | Temp 97.9°F | Ht 63.0 in | Wt 124.8 lb

## 2023-08-18 DIAGNOSIS — R197 Diarrhea, unspecified: Secondary | ICD-10-CM | POA: Insufficient documentation

## 2023-08-18 DIAGNOSIS — W19XXXA Unspecified fall, initial encounter: Secondary | ICD-10-CM | POA: Insufficient documentation

## 2023-08-18 DIAGNOSIS — S0990XA Unspecified injury of head, initial encounter: Secondary | ICD-10-CM

## 2023-08-18 MED ORDER — DIPHENOXYLATE-ATROPINE 2.5-0.025 MG PO TABS: 1.0000 | ORAL_TABLET | Freq: Four times a day (QID) | ORAL | 0 refills | Status: DC | PRN

## 2023-08-18 NOTE — Assessment & Plan Note (Signed)
 Acute Recent fall at home resulting in posterior head injury and concussion Symptoms of concussion have resolved Just has posterior head pain from where she hit her head She will let me know if there is any concerning symptoms, symptomatic treatment for head pain

## 2023-08-18 NOTE — Patient Instructions (Signed)
        Medications changes include :   Lomotil  as needed for diarrhea    A referral was ordered GI and someone will call you to schedule an appointment.

## 2023-08-18 NOTE — Assessment & Plan Note (Signed)
 Acute Started 3 weeks ago and is daily Associated with abdominal cramping before bowel movement No other concerning symptoms such as fever, blood in the stool, persistent abdominal pain She is on Ozempic, but has been on it for over a year and never had diarrhea, potentially this could still be a cause Advised decreasing frequency of Ozempic to see if that helps Will try Lomotil  Doubt infection, food poisoning so we will hold off on stool studies ?  Microscopic colitis Referral to GI

## 2023-08-18 NOTE — Assessment & Plan Note (Signed)
 Acute Recent fall at home resulting in head trauma and mild concussion Has had a couple of other falls in the past Discussed fall prevention She needs to hang onto a railing and not carry a lot of foods when going up and down Overall she needs to slow down and she realizes that we will try to make changes The stairs she recently fell down does not have a railing and she will have a railing put up

## 2023-08-21 ENCOUNTER — Ambulatory Visit: Payer: Medicare Other

## 2023-08-24 ENCOUNTER — Ambulatory Visit (INDEPENDENT_AMBULATORY_CARE_PROVIDER_SITE_OTHER): Payer: Medicare Other

## 2023-08-24 DIAGNOSIS — Z23 Encounter for immunization: Secondary | ICD-10-CM | POA: Diagnosis not present

## 2023-08-25 ENCOUNTER — Telehealth: Payer: Self-pay | Admitting: Gastroenterology

## 2023-08-25 NOTE — Telephone Encounter (Signed)
Inbound call to discuss chronic diarrhea. She has had this going on for over a month. Please advise.

## 2023-08-28 ENCOUNTER — Telehealth: Payer: Self-pay | Admitting: Internal Medicine

## 2023-08-28 NOTE — Telephone Encounter (Signed)
Returned call to patient. Pt was under the impression that her appt for 3/14 was a colonoscopy appt. I explained to patient that her appt was for a follow up. Pt states that her PCP referred her for a colonoscopy. I informed patient that our protocol is that if a patient is having GI symptoms they have to be seen in the office prior to scheduling a procedure. Pt was upset that she already has to wait until March for an appt and it's not the colonoscopy. Pt states that she has been having diarrhea for a month and this is ridiculous. I did not have a chance to ask the patient any questions regarding medications because she was adamant that her colonoscopy was already approved and that's what she was scheduled for. Patient stated that "she wasn't lying" and she had this information written down in her notes. I told patient that I did not say that she was lying, but I was only telling her what I can see from my end. No ambulatory referral or PV with nurse. Patient asked for my name. Patient informed me that I "told her the same thing 10 times". I explained that I was just reiterating our process. I told pt that I was not sure where the miscommunication happened and that this is my first time speaking with her at all. Patient was still upset and ended the call.

## 2023-08-28 NOTE — Telephone Encounter (Unsigned)
Copied from CRM [id number]. Topic: Referral - Status >> Aug 28, 2023  3:17 PM [place] E wrote: Reason for CRM: Patient called in regarding the status of her gastroenterology referral. Was able to pull up the referral in her chart and saw the status was "authorized" on 2/10, relayed this to patient. When she spoke with the gastro office, they told patient that they did not receive this referral yet. Callback number for patient is [phone] for clarification.

## 2023-08-29 NOTE — Telephone Encounter (Signed)
 Patient added to wait list

## 2023-08-29 NOTE — Telephone Encounter (Signed)
 Patient has been scheduled

## 2023-08-30 ENCOUNTER — Telehealth: Payer: Self-pay

## 2023-08-30 ENCOUNTER — Ambulatory Visit (INDEPENDENT_AMBULATORY_CARE_PROVIDER_SITE_OTHER): Payer: Medicare Other

## 2023-08-30 VITALS — Ht 63.0 in | Wt 123.6 lb

## 2023-08-30 DIAGNOSIS — Z0000 Encounter for general adult medical examination without abnormal findings: Secondary | ICD-10-CM | POA: Diagnosis not present

## 2023-08-30 MED ORDER — DIPHENOXYLATE-ATROPINE 2.5-0.025 MG PO TABS: 1.0000 | ORAL_TABLET | Freq: Four times a day (QID) | ORAL | 0 refills | Status: DC | PRN

## 2023-08-30 NOTE — Telephone Encounter (Signed)
Renewed. Thanks.

## 2023-08-30 NOTE — Patient Instructions (Addendum)
Julia [name] , Thank you for taking time to come for your Medicare Wellness Visit. I appreciate your ongoing commitment to your health goals. Please review the following plan we discussed and let me know if I can assist you in the future.   Referrals/Orders/Follow-Ups/Clinician Recommendations: It was very nice talking with you today.  Keep up the good work.  Remember to call [facility] at [phone], to see if you can be placed on the cancellation list.  You have an order for:   [x]   Bone Density     Please call for appointment:  [facility] [address] [place], [place] [address] [phone]    Make sure to wear two-piece clothing.  No lotions, powders, or deodorants the day of the appointment. Make sure to bring picture ID and insurance card.  Bring list of medications you are currently taking including any supplements.   Schedule your [place] screening mammogram through MyChart!   Log into your MyChart account.  Go to 'Visit' (or 'Appointments' if on mobile App) --> Schedule an Appointment  Under 'Select a Reason for Visit' choose the Mammogram Screening option.  Complete the pre-visit questions and select the time and place that best fits your schedule.    This is a list of the screening recommended for you and due dates:  Health Maintenance  Topic Date Due   Mammogram  01/22/2023   COVID-19 Vaccine (5 - 2024-25 season) 03/12/2023   DEXA scan (bone density measurement)  10/23/2023   Medicare Annual Wellness Visit  08/29/2024   Colon Cancer Screening  02/11/2026   DTaP/Tdap/Td vaccine (3 - Td or Tdap) 07/23/2033   Pneumonia Vaccine  Completed   Flu Shot  Completed   Hepatitis C Screening  Completed   Zoster (Shingles) Vaccine  Completed   HPV Vaccine  Aged Out    Advanced directives: (Declined) Advance directive discussed with you today. Even though you declined this today, please call our office should you  change your mind, and we can give you the proper paperwork for you to fill out.  Next Medicare Annual Wellness Visit scheduled for next year: Yes

## 2023-08-30 NOTE — Progress Notes (Cosign Needed Addendum)
 Subjective:   Julia [name] is a 73 y.o. female who presents for Medicare Annual (Subsequent) preventive examination.  Visit Complete: Virtual I connected with  [name] on 08/30/23 by a video and audio enabled telemedicine application and verified that I am speaking with the correct person using two identifiers.  Patient Location: Home  Provider Location: Home Office  I discussed the limitations of evaluation and management by telemedicine. The patient expressed understanding and agreed to proceed.  Vital Signs: Because this visit was a virtual/telehealth visit, some criteria may be missing or patient reported. Any vitals not documented were not able to be obtained and vitals that have been documented are patient reported.   Cardiac Risk Factors include: advanced age (>44men, >67 women);hypertension;Other (see comment), Risk factor comments: Aortic atherosclerosis, TIA ,Paroxysmal a-fib     Objective:    Today's Vitals   08/30/23 1019  Weight: 123 lb 9.6 oz (56.1 kg)  Height: 5\' 3"  (1.6 m)   Body mass index is 21.89 kg/m.     08/30/2023   10:34 AM 08/09/2023   11:06 AM 07/20/2023   10:44 AM 02/14/2022   11:37 AM 06/19/2020    8:00 AM 05/27/2020   10:56 AM  Advanced Directives  Does Patient Have a Medical Advance Directive? Yes No No Yes Yes Yes  Type of Advance Directive Healthcare Power of Attorney   Living will;Healthcare Power of Attorney Living will;Healthcare Power of Attorney Living will  Does patient want to make changes to medical advance directive?    No - Patient declined    Copy of Healthcare Power of Attorney in Chart?    No - copy requested    Would patient like information on creating a medical advance directive?  No - Patient declined No - Patient declined       Current Medications (verified) Outpatient Encounter Medications as of 08/30/2023  Medication Sig   amLODipine (NORVASC) 2.5 MG tablet TAKE 1 TABLET(2.5 MG) BY MOUTH DAILY   apixaban (ELIQUIS) 5 MG  TABS tablet TAKE 1 TABLET(5 MG) BY MOUTH TWICE DAILY   Ascorbic Acid (VITAMIN C PO) Take 3,200 mg by mouth daily. 1600 mg each   Calcium Citrate-Vitamin D (CALCIUM + D PO) Take 1 capsule by mouth 1 day or 1 dose.   Cholecalciferol (VITAMIN D3) 250 MCG (10000 UT) TABS Take 5,000 Units by mouth 2 (two) times daily.    Coenzyme Q10 (COQ-10) 100 MG CAPS Take 200 mg by mouth daily.   diphenoxylate-atropine (LOMOTIL) 2.5-0.025 MG tablet Take 1 tablet by mouth 4 (four) times daily as needed for diarrhea or loose stools.   Krill Oil 1000 MG CAPS Take 1,000 mg by mouth daily.   levothyroxine (SYNTHROID) 25 MCG tablet TAKE 1 TABLET(25 MCG) BY MOUTH DAILY   Misc Natural Products ([employer] GLUCOS-CHONDROIT-MSM TS) TABS Take 2 tablets by mouth at bedtime.   Multiple Vitamins-Minerals (CENTRUM SILVER PO) Take 1 tablet by mouth daily.    naltrexone (DEPADE) 50 MG tablet Take by mouth daily.   ondansetron (ZOFRAN) 4 MG tablet    OZEMPIC, 2 MG/DOSE, 8 MG/3ML SOPN Inject 2 mg into the skin once a week.   riTUXimab (RITUXAN) 500 MG/50ML injection Inject 10 mg into the vein every 6 (six) months.   triamcinolone ointment (KENALOG) 0.5 % Apply topically 2 (two) times daily. APPLY TOPICALLY TO THE AFFECTED AREA TWICE DAILY   valACYclovir (VALTREX) 500 MG tablet TAKE 1 TABLET(500 MG) BY MOUTH DAILY   venlafaxine XR (EFFEXOR-XR) 150 MG  24 hr capsule TAKE 1 CAPSULE(150 MG) BY MOUTH DAILY WITH BREAKFAST   venlafaxine XR (EFFEXOR-XR) 75 MG 24 hr capsule Take 1 capsule (75 mg total) by mouth daily with breakfast.   Facility-Administered Encounter Medications as of 08/30/2023  Medication   sodium chloride flush (NS) 0.9 % injection 10 mL    Allergies (verified) Patient has no known allergies.   History: Past Medical History:  Diagnosis Date   Adjustment disorder with mixed anxiety and depressed mood    Arthritis    Depression    GERD (gastroesophageal reflux disease) 01/13/2017   Hemangioma 04/18/2017   History of  breast augmentation    History of colon polyps 01/14/2017   Last colonoscopy 2018 - no polyps - advised 5 years   History of gastric bypass    History of pelvis fracture    in 30's   History of tuberculosis exposure 01/14/2017   Hx of latent TB, treated for 9 months   Hyperglycemia 09/28/2020   Hypertension    Hypothyroidism 01/14/2017   Melanocytic nevi, unspecified 04/18/2017   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   Obesity    Osteoporosis 01/14/2017   07/2018 - spine  -2.3,  RFN  -1.9,  LFN  -2.3   - improvement in hips dexa 07/25/17 - spine  -3.1,   RFN  -2.2,  LFN  -2.4  - started on prolia DEXA 05/13/2010 in [place], osteopenia   Spine: -2.3   LFN  -1.7   RFN:  -1.8     FRAX  15%, 1.9%    Paroxysmal atrial fibrillation 09/29/2020   PFO with atrial septal aneurysm 06/19/2020   Plant dermatitis 01/08/2020   Pneumonia    Purpura 04/18/2017   Rheumatoid arthritis 01/13/2017   Following at [facility] rheumatology  Has been on Prednisone, Plaquenil, Arava, Humira, etolodac, Enbrel, Orencia, Remicade and Rituxan     Sebaceous cyst 04/18/2017   TIA (transient ischemic attack) 04/22/2020   Ventral hernia without obstruction or gangrene 11/01/2019   Noted on Ct scan 10/2019   Vitamin D deficiency 06/22/2017   Past Surgical History:  Procedure Laterality Date   APPENDECTOMY     breast enhancement removal Bilateral 03/2022   BREAST ENHANCEMENT SURGERY     BUBBLE STUDY  05/27/2020   Procedure: BUBBLE STUDY;  Surgeon: Sande Rives, MD;  Location: [facility] ENDOSCOPY;  Service: Cardiovascular;;   GASTRIC BYPASS     PATENT FORAMEN OVALE(PFO) CLOSURE N/A 06/19/2020   Procedure: PATENT FORAMEN OVALE (PFO) CLOSURE;  Surgeon: Tonny Bollman, MD;  Location: [facility] INVASIVE CV LAB;  Service: Cardiovascular;  Laterality: N/A;   TEE WITHOUT CARDIOVERSION N/A 05/27/2020   Procedure: TRANSESOPHAGEAL ECHOCARDIOGRAM (TEE);  Surgeon: Sande Rives, MD;  Location: [facility] ENDOSCOPY;  Service:  Cardiovascular;  Laterality: N/A;   TONSILLECTOMY     Family History  Problem Relation Age of Onset   Hypertension Mother    Heart disease Mother    Heart failure Mother    Hypertension Father    Ulcers Father    Gallstones Father    Kidney Stones Father    Hypertension Sister    Hypertension Brother    Heart attack Maternal Grandmother    Colon cancer Neg Hx    Stomach cancer Neg Hx    Esophageal cancer Neg Hx    Alzheimer's disease Neg Hx    Dementia Neg Hx    Social History   Socioeconomic History   Marital status: Divorced    Spouse name: Not on file  Number of children: 0   Years of education: 12   Highest education level: 12th grade  Occupational History   Occupation: retired    Comment: president of [employer]  Tobacco Use   Smoking status: Never   Smokeless tobacco: Never  Vaping Use   Vaping status: Never Used  Substance and Sexual Activity   Alcohol use: Yes    Alcohol/week: 4.0 standard drinks of alcohol    Types: 2 Glasses of wine, 2 Cans of beer per week    Comment: per week   Drug use: Never   Sexual activity: Not on file  Other Topics Concern   Not on file  Social History Narrative   Retired 07/10/17 from [employer]   No regular exercise      Lives alone   Caffeine: 2 glasses/day diet dr pepper   Social Drivers of Health   Financial Resource Strain: Low Risk  (08/30/2023)   Overall Financial Resource Strain (CARDIA)    Difficulty of Paying Living Expenses: Not very hard  Food Insecurity: No Food Insecurity (08/30/2023)   Hunger Vital Sign    Worried About Running Out of Food in the Last Year: Never true    Ran Out of Food in the Last Year: Never true  Transportation Needs: No Transportation Needs (08/30/2023)   PRAPARE - Administrator, Civil Service (Medical): No    Lack of Transportation (Non-Medical): No  Physical Activity: Sufficiently Active (08/30/2023)   Exercise Vital Sign    Days of Exercise per Week: 3 days    Minutes of Exercise  per Session: 60 min  Stress: No Stress Concern Present (08/30/2023)   [employer] of Occupational Health - Occupational Stress Questionnaire    Feeling of Stress : Not at all  Social Connections: Socially Isolated (08/30/2023)   Social Connection and Isolation Panel [NHANES]    Frequency of Communication with Friends and Family: More than three times a week    Frequency of Social Gatherings with Friends and Family: Once a week    Attends Religious Services: Never    Database administrator or Organizations: No    Attends Engineer, structural: Never    Marital Status: Divorced    Tobacco Counseling Counseling given: Not Answered   Clinical Intake:  Pre-visit preparation completed: Yes  Pain : No/denies pain     BMI - recorded: 21.89 Nutritional Status: BMI of 19-24  Normal Nutritional Risks: Nausea/ vomitting/ diarrhea (diarrhea) Diabetes: No  How often do you need to have someone help you when you read instructions, pamphlets, or other written materials from your doctor or pharmacy?: 1 - Never  Interpreter Needed?: No  Information entered by :: Deshon Hsiao, RMA   Activities of Daily Living    08/30/2023   10:30 AM  In your present state of health, do you have any difficulty performing the following activities:  Hearing? 0  Vision? 0  Difficulty concentrating or making decisions? 0  Walking or climbing stairs? 0  Dressing or bathing? 0  Doing errands, shopping? 0  Preparing Food and eating ? N  Using the Toilet? N  In the past six months, have you accidently leaked urine? N  Do you have problems with loss of bowel control? N  Managing your Medications? N  Managing your Finances? N  Housekeeping or managing your Housekeeping? N    Patient Care Team: Pincus Sanes, MD as PCP - General (Internal Medicine) Tonny Bollman, MD as PCP - Cardiology (  Cardiology) Szabat, Vinnie Level, [facility] (Inactive) as Pharmacist (Pharmacist) Anson Fret, MD as  Referring Physician (Neurology) Rosann Auerbach, PhD as Consulting Physician (Neurology) [facility], [facility]  Indicate any recent Medical Services you may have received from other than [facility] providers in the past year (date may be approximate).     Assessment:   This is a routine wellness examination for [place].  Hearing/Vision screen Hearing Screening - Comments:: Denies hearing difficulties   Vision Screening - Comments:: Denies vision issues.    Goals Addressed               This Visit's Progress     Patient Stated (pt-stated)   On track     Patient states she has a goal of completing 5,000 steps a day. 2025      Depression Screen    08/30/2023   10:40 AM 08/18/2023    2:21 PM 07/24/2023   11:46 AM 11/08/2022    1:12 PM 07/15/2022   10:59 AM 02/14/2022   11:34 AM 12/22/2021   10:09 AM  PHQ 2/9 Scores  PHQ - 2 Score 1 0 0 0 1 1 2   PHQ- 9 Score 4 2 0 0   6    Fall Risk    08/30/2023   10:35 AM 08/18/2023    2:15 PM 07/24/2023   11:43 AM 11/08/2022    1:12 PM 07/15/2022   10:58 AM  Fall Risk   Falls in the past year? 1 1 1  0 0  Number falls in past yr: 0 0 0 0 0  Injury with Fall? 0 1 1 0 0  Risk for fall due to :  No Fall Risks No Fall Risks No Fall Risks No Fall Risks  Follow up Falls evaluation completed;Falls prevention discussed Falls evaluation completed Falls evaluation completed Falls evaluation completed Falls evaluation completed    MEDICARE RISK AT HOME: Medicare Risk at Home Any stairs in or around the home?: Yes (2 steps outside) If so, are there any without handrails?: No Home free of loose throw rugs in walkways, pet beds, electrical cords, etc?: Yes Adequate lighting in your home to reduce risk of falls?: Yes Life alert?: Yes Use of a cane, walker or w/c?: No Grab bars in the bathroom?: Yes Shower chair or bench in shower?: No Elevated toilet seat or a handicapped toilet?: Yes  TIMED UP AND GO:  Was the test performed?  No    Cognitive  Function:        08/30/2023   10:35 AM 02/14/2022   11:38 AM  6CIT Screen  What Year? 0 points 0 points  What month? 0 points 0 points  What time? 0 points 0 points  Count back from 20 0 points 0 points  Months in reverse 0 points 0 points  Repeat phrase 0 points 0 points  Total Score 0 points 0 points    Immunizations Immunization History  Administered Date(s) Administered   Fluad Quad(high Dose 65+) 03/21/2019, 04/28/2021   Hepatitis A, Adult 02/15/2023, 08/24/2023   Hepb-cpg 02/15/2023, 03/20/2023   Influenza, High Dose Seasonal PF 05/30/2018, 04/13/2022, 03/16/2023   Influenza-Unspecified 05/11/2017   PFIZER Comirnaty(Gray Top)Covid-19 Tri-Sucrose Vaccine 04/13/2022   PFIZER(Purple Top)SARS-COV-2 Vaccination 08/02/2019, 08/22/2019, 03/13/2021   PNEUMOCOCCAL CONJUGATE-20 04/28/2021   Pneumococcal Conjugate-13 07/17/2018   Pneumococcal Polysaccharide-23 04/07/2010, 10/29/2019   Td 07/24/2023   Tdap 10/19/2011   Zoster Recombinant(Shingrix) 07/25/2017, 10/10/2017   Zoster, Live 09/21/2005    TDAP status: Up to date  Flu Vaccine status: Up to date  Pneumococcal vaccine status: Up to date  Covid-19 vaccine status: Completed vaccines  Qualifies for Shingles Vaccine? Yes   Zostavax completed Yes   Shingrix Completed?: Yes  Screening Tests Health Maintenance  Topic Date Due   MAMMOGRAM  01/22/2023   COVID-19 Vaccine (5 - 2024-25 season) 03/12/2023   DEXA SCAN  10/23/2023   Medicare Annual Wellness (AWV)  08/29/2024   Colonoscopy  02/11/2026   DTaP/Tdap/Td (3 - Td or Tdap) 07/23/2033   Pneumonia Vaccine 3+ Years old  Completed   INFLUENZA VACCINE  Completed   Hepatitis C Screening  Completed   Zoster Vaccines- Shingrix  Completed   HPV VACCINES  Aged Out    Health Maintenance  Health Maintenance Due  Topic Date Due   MAMMOGRAM  01/22/2023   COVID-19 Vaccine (5 - 2024-25 season) 03/12/2023    Colorectal cancer screening: Referral to GI placed  08/18/2023. Pt aware the office will call re: appt.  Mammogram status: No longer required due to breast removal.  Bone Density status: Ordered 08/30/2023. Pt provided with contact info and advised to call to schedule appt.  Lung Cancer Screening: (Low Dose CT Chest recommended if Age 19-80 years, 20 pack-year currently smoking OR have quit w/in 15years.) does not qualify.   Lung Cancer Screening Referral: N/A  Additional Screening:  Hepatitis C Screening: does qualify; Completed 02/13/2023  Vision Screening: Recommended annual ophthalmology exams for early detection of glaucoma and other disorders of the eye. Is the patient up to date with their annual eye exam?  Yes  Who is the provider or what is the name of the office in which the patient attends annual eye exams? [facility] If pt is not established with a provider, would they like to be referred to a provider to establish care? No .   Dental Screening: Recommended annual dental exams for proper oral hygiene   Community Resource Referral / Chronic Care Management: CRR required this visit?  No   CCM required this visit?  No     Plan:     I have personally reviewed and noted the following in the patient's chart:   Medical and social history Use of alcohol, tobacco or illicit drugs  Current medications and supplements including opioid prescriptions. Patient is not currently taking opioid prescriptions. Functional ability and status Nutritional status Physical activity Advanced directives List of other physicians Hospitalizations, surgeries, and ER visits in previous 12 months Vitals Screenings to include cognitive, depression, and falls Referrals and appointments  In addition, I have reviewed and discussed with patient certain preventive protocols, quality metrics, and best practice recommendations. A written personalized care plan for preventive services as well as general preventive health recommendations  were provided to patient.     Maigen Mozingo L Kymberly Blomberg, CMA   08/30/2023   After Visit Summary: (Mail) Due to this being a telephonic visit, the after visit summary with patients personalized plan was offered to patient via mail   Nurse Notes: Patient is requesting for a refill on Diphenoxylate-atropine 2.5-0.025 MG QID, PRN.  She is still having diarrhea, however she has a consultation with GI but not until March.   Patient is due for a DEXA, which order has been placed today.  She is up to date on all other health maintenance with no other concerns to address today.   Medical screening examination/treatment/procedure(s) were performed by non-physician practitioner and as supervising physician I was immediately available for consultation/collaboration.  I agree with  above. Jacinta Shoe, MD

## 2023-08-30 NOTE — Telephone Encounter (Signed)
Patient is requesting for a refill today on Diphenoxylate-atropine 2.5-0.025 MG QID, PRN.  She is still having diarrhea, however she has a consultation with GI but not until March.

## 2023-08-31 NOTE — Telephone Encounter (Signed)
 Noted

## 2023-08-31 NOTE — Telephone Encounter (Signed)
Inbound call from patient, wanting to confirm referral was received. Advised patient referral was received for diarrhea, not a colonoscopy. Also reiterated, that per office protocol she would need office visit first like [place], [place] stated. Patient was very upset and stated that she was on the cancellation list but at the moment she had the soonest available. Patient stated she would send a direct message to Dr. Adela Lank because "she could not believe how a provider would watch her suffer like this." Patient was upset and ended the call.

## 2023-09-06 ENCOUNTER — Other Ambulatory Visit: Payer: Self-pay | Admitting: Cardiovascular Disease

## 2023-09-06 DIAGNOSIS — I480 Paroxysmal atrial fibrillation: Secondary | ICD-10-CM

## 2023-09-07 NOTE — Telephone Encounter (Signed)
 Prescription refill request for Eliquis received. Indication: PAF Last office visit: 03/10/23  Dayle Points MD Scr: 0.5 on 05/26/23  Epic Age: 73 Weight: 56.6kg  Based on above findings Eliquis 5mg  twice daily is the appropriate dose.  Refill approved.

## 2023-09-08 ENCOUNTER — Encounter: Payer: Self-pay | Admitting: Psychology

## 2023-09-08 DIAGNOSIS — Z6831 Body mass index (BMI) 31.0-31.9, adult: Secondary | ICD-10-CM | POA: Insufficient documentation

## 2023-09-08 DIAGNOSIS — R7612 Nonspecific reaction to cell mediated immunity measurement of gamma interferon antigen response without active tuberculosis: Secondary | ICD-10-CM | POA: Insufficient documentation

## 2023-09-08 DIAGNOSIS — R5383 Other fatigue: Secondary | ICD-10-CM | POA: Insufficient documentation

## 2023-09-11 ENCOUNTER — Ambulatory Visit (INDEPENDENT_AMBULATORY_CARE_PROVIDER_SITE_OTHER): Payer: Medicare Other | Admitting: Psychology

## 2023-09-11 ENCOUNTER — Encounter: Payer: Self-pay | Admitting: Psychology

## 2023-09-11 ENCOUNTER — Ambulatory Visit: Payer: Self-pay

## 2023-09-11 DIAGNOSIS — R4189 Other symptoms and signs involving cognitive functions and awareness: Secondary | ICD-10-CM

## 2023-09-11 DIAGNOSIS — F4323 Adjustment disorder with mixed anxiety and depressed mood: Secondary | ICD-10-CM | POA: Diagnosis not present

## 2023-09-11 NOTE — Progress Notes (Signed)
 NEUROPSYCHOLOGICAL EVALUATION [place]. [facility] [place] Department of Neurology  Date of Evaluation: September 11, 2023  Reason for Referral:   Julia [name] is a 73 y.o. right-handed Caucasian female referred by  Naomie Dean, M.D. , to characterize her current cognitive functioning and assist with diagnostic clarity and treatment planning in the context of subjective memory complaints.   Assessment and Plan:   Clinical Impression(s): Julia [name]'s pattern of performance is suggestive of neuropsychological functioning within normal limits relative to age-matched peers. Performances were appropriate across all domains. This includes processing speed, attention/concentration, executive functioning, receptive and expressive language, visuospatial abilities, and learning and memory. No relative weaknesses were exhibited. Julia [name] denied difficulties completing instrumental activities of daily living (ADLs) independently. She does not warrant consideration for a neurocognitive disorder at the present time.   Relative to her previous evaluation in July 2022, no domains exhibited evidence for cognitive decline. In fact, some improvement could be argued across memory testing, list and shape learning performances in particular. Improvements are likely due to a combination of practice effects across testing and some amelioration of psychiatric distress and psychosocial stressors which were more prevalent around the time of previous testing. All other assessed domains exhibited stability over time. Specific to memory, Julia [name] was able to learn novel verbal and visual information efficiently and retain this knowledge after lengthy delays. Overall, memory performance combined with intact performances across other areas of cognitive functioning is not suggestive of Alzheimer's disease. Likewise, her cognitive and behavioral profile is not suggestive of any other form of neurodegenerative illness  presently.  Recommendations: Should Julia [name] express concerns surrounding worsening cognitive dysfunction in the future, a repeat evaluation would be warranted at that time.  Julia [name] is encouraged to attend to lifestyle factors for brain health (e.g., regular physical exercise, good nutrition habits and consideration of the MIND-DASH diet, regular participation in cognitively-stimulating activities, and general stress management techniques), which are likely to have benefits for both emotional adjustment and cognition. In fact, in addition to promoting good general health, regular exercise incorporating aerobic activities (e.g., brisk walking, jogging, cycling, etc.) has been demonstrated to be a very effective treatment for depression and stress, with similar efficacy rates to both antidepressant medication and psychotherapy. Optimal control of vascular risk factors (including safe cardiovascular exercise and adherence to dietary recommendations) is encouraged. Continued participation in activities which provide mental stimulation and social interaction is also recommended.   If interested, there are some activities which have therapeutic value and can be useful in keeping her cognitively stimulated. For suggestions, Julia [name] is encouraged to go to the following website: https://www.[facility].org/get-to-know-barrow/centers-programs/neurorehabilitation-center/neuro-rehab-apps-and-games/ which has options, categorized by level of difficulty. It should be noted that these activities should not be viewed as a substitute for therapy.  Memory can be improved using internal strategies such as rehearsal, repetition, chunking, mnemonics, association, and imagery. External strategies such as written notes in a consistently used memory journal, visual and nonverbal auditory cues such as a calendar on the refrigerator or appointments with alarm, such as on a cell phone, can also help maximize recall.    To  address problems with fluctuating attention and/or executive dysfunction, she may wish to consider:   -Avoiding external distractions when needing to concentrate   -Limiting exposure to fast paced environments with multiple sensory demands   -Writing down complicated information and using checklists   -Attempting and completing one task at a time (i.e., no multi-tasking)   -Verbalizing aloud each step of  a task to maintain focus   -Taking frequent breaks during the completion of steps/tasks to avoid fatigue   -Reducing the amount of information considered at one time   -Scheduling more difficult activities for a time of day where she is usually most alert  Review of Records:   Julia [name] was seen by [facility] Naomie Dean, M.D.) on 05/10/2020 following an ED admission on 02/14/2020. She presented with headache, photophobia, lightheadedness, "everything blinking", nausea, vomiting, and a changed speech pattern. Stroke code was activated. Her initial CT and labs were negative. Brain MRI revealed no acute abnormality. Her diagnosis at that time was said to be complex migraine versus peripheral vertigo. No focal deficits in arm/leg strength was observed. Symptoms were said to improve and she was discharged home. While meeting with Dr. Lucia Gaskins, she reported this event consistent with prior medical records. Dr. Lucia Gaskins expressed concerns surrounding a transient ischemic attack (TIA).   A heart monitor was placed and atrial fibrillation was later detected. A patent foramen ovale (PFO) was also found. She underwent a surgical procedure related to the closure of her PFO and was discharged home without incident following this procedure on 06/19/2020.    She followed up with [facility] Ihor Austin, NP) on 08/25/2020. Since undergoing her PFO procedure, Julia [name] denied any additional TIA symptoms. She reported compliance with all medications without notable side effects. Blood pressure was 128/74 and  no additional concerns were reported at that time.   Julia [name] met with her PCP Cheryll Cockayne, M.D.) on 09/29/2020 for follow-up. At that time, she reported a significant increase in stress surrounding ongoing divorce proceedings and legal/financial challenges surrounding this. With this ongoing, she reported observing ongoing memory concerns. She also acknowledged some symptoms of depression and anxiety. Ultimately, Julia [name] was referred for a comprehensive neuropsychological evaluation to characterize her cognitive abilities and to assist with diagnostic clarity and treatment planning.   She completed a comprehensive neuropsychological evaluation with myself on 01/18/2021. Results suggested neuropsychological functioning within normal limits. Performances across all cognitive domains were consistently appropriate. This included processing speed, attention/concentration, executive functioning, receptive and expressive language, visuospatial abilities, and learning and memory. No relative weaknesses were exhibited.   Julia [name] has continued to follow with [facility] over time, most recently meeting with Dr. Lucia Gaskins on 10/17/2022. At that time, she was noting some confusional arousals from sleep where she wakes feeling foggy and poorly oriented. Subsequent labs were unremarkable. A follow-up sleep study was also unremarkable.   Ultimately, Julia [name] was referred for a repeat neuropsychological evaluation to characterize her cognitive abilities and to assist with diagnostic clarity and treatment planning.   Neuroimaging: Brain MRI on 02/14/2020 was unremarkable. Brain MRI on 07/07/2022 was also unremarkable. Head CT on 07/20/2023 in the context of a fall was negative. Head CT on 08/09/2023 in the context of another fall was negative.  Past Medical History:  Diagnosis Date   Adjustment disorder with mixed anxiety and depressed mood    Age-related osteoporosis without current pathological fracture 01/14/2017   07/2018 - spine   -2.3,  RFN  -1.9,  LFN  -2.3   - improvement in hips  dexa 07/25/17 - spine  -3.1,   RFN  -2.2,  LFN  -2.4  - started on prolia  DEXA 05/13/2010 in [place], osteopenia   Spine: -2.3   LFN  -1.7   RFN:  -1.8      FRAX  15%, 1.9%        Aortic  atherosclerosis 01/20/2022   Noted on abdominal CT 12/2021     Arthritis    Body mass index (BMI) 31.0-31.9, adult    Confusional arousals 09/06/2022   Constipation 12/22/2021   Elevated LFTs 07/15/2022   Fatigue    GERD (gastroesophageal reflux disease) 01/13/2017   Hemangioma 04/18/2017   History of breast augmentation    History of colon polyps 01/14/2017   Last colonoscopy 2018 - no polyps - advised 5 years   History of gastric bypass    History of pelvis fracture    in 30's   History of tuberculosis exposure 01/14/2017   Hx of latent TB, treated for 9 months   Hyperglycemia 09/28/2020   Hypertension    Hyponatremia 07/15/2022   Hypothyroidism 01/14/2017   Laceration of right hand without foreign body 07/24/2023   Melanocytic nevi, unspecified 04/18/2017   Menopause 12/25/2019   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   Osteoporosis 01/14/2017   07/2018 - spine  -2.3,  RFN  -1.9,  LFN  -2.3   - improvement in hips dexa 07/25/17 - spine  -3.1,   RFN  -2.2,  LFN  -2.4  - started on prolia DEXA 05/13/2010 in [place], osteopenia   Spine: -2.3   LFN  -1.7   RFN:  -1.8     FRAX  15%, 1.9%    Paresthesia 07/17/2018   Paroxysmal atrial fibrillation 09/29/2020   PFO with atrial septal aneurysm 06/19/2020   Plant dermatitis 01/08/2020   PND (post-nasal drip) 11/08/2022   Pneumonia    Positive QuantiFERON-TB Gold test    Purpura 04/18/2017   Rheumatoid arthritis 01/13/2017   Following at [facility]  Has been on Prednisone, Plaquenil, Arava, Humira, etolodac, Enbrel, Orencia, Remicade and Rituxan     Sebaceous cyst 04/18/2017   Spells of decreased attentiveness 09/06/2022   Steatorrhea 11/08/2022   Subjective memory complaints 07/24/2023    TIA (transient ischemic attack) 04/22/2020   Ventral hernia without obstruction or gangrene 11/01/2019   Noted on Ct scan 10/2019   Vitamin D deficiency 06/22/2017    Past Surgical History:  Procedure Laterality Date   APPENDECTOMY     breast enhancement removal Bilateral 03/2022   BREAST ENHANCEMENT SURGERY     BUBBLE STUDY  05/27/2020   Procedure: BUBBLE STUDY;  Surgeon: Sande Rives, MD;  Location: [facility] ENDOSCOPY;  Service: Cardiovascular;;   GASTRIC BYPASS     PATENT FORAMEN OVALE(PFO) CLOSURE N/A 06/19/2020   Procedure: PATENT FORAMEN OVALE (PFO) CLOSURE;  Surgeon: Tonny Bollman, MD;  Location: [facility] INVASIVE CV LAB;  Service: Cardiovascular;  Laterality: N/A;   TEE WITHOUT CARDIOVERSION N/A 05/27/2020   Procedure: TRANSESOPHAGEAL ECHOCARDIOGRAM (TEE);  Surgeon: Sande Rives, MD;  Location: [facility] ENDOSCOPY;  Service: Cardiovascular;  Laterality: N/A;   TONSILLECTOMY      Current Outpatient Medications:    amLODipine (NORVASC) 2.5 MG tablet, TAKE 1 TABLET(2.5 MG) BY MOUTH DAILY, Disp: 90 tablet, Rfl: 3   Ascorbic Acid (VITAMIN C PO), Take 3,200 mg by mouth daily. 1600 mg each, Disp: , Rfl:    Calcium Citrate-Vitamin D (CALCIUM + D PO), Take 1 capsule by mouth 1 day or 1 dose., Disp: , Rfl:    Cholecalciferol (VITAMIN D3) 250 MCG (10000 UT) TABS, Take 5,000 Units by mouth 2 (two) times daily. , Disp: , Rfl:    Coenzyme Q10 (COQ-10) 100 MG CAPS, Take 200 mg by mouth daily., Disp: , Rfl:    diphenoxylate-atropine (LOMOTIL) 2.5-0.025 MG tablet, Take 1 tablet by  mouth 4 (four) times daily as needed for diarrhea or loose stools., Disp: 40 tablet, Rfl: 0   ELIQUIS 5 MG TABS tablet, TAKE 1 TABLET(5 MG) BY MOUTH TWICE DAILY, Disp: 180 tablet, Rfl: 1   Krill Oil 1000 MG CAPS, Take 1,000 mg by mouth daily., Disp: , Rfl:    levothyroxine (SYNTHROID) 25 MCG tablet, TAKE 1 TABLET(25 MCG) BY MOUTH DAILY, Disp: 90 tablet, Rfl: 3   Misc Natural Products ([employer] GLUCOS-CHONDROIT-MSM TS)  TABS, Take 2 tablets by mouth at bedtime., Disp: , Rfl:    Multiple Vitamins-Minerals (CENTRUM SILVER PO), Take 1 tablet by mouth daily. , Disp: , Rfl:    naltrexone (DEPADE) 50 MG tablet, Take by mouth daily., Disp: , Rfl:    ondansetron (ZOFRAN) 4 MG tablet, , Disp: , Rfl:    OZEMPIC, 2 MG/DOSE, 8 MG/3ML SOPN, Inject 2 mg into the skin once a week., Disp: , Rfl:    riTUXimab (RITUXAN) 500 MG/50ML injection, Inject 10 mg into the vein every 6 (six) months., Disp: , Rfl:    triamcinolone ointment (KENALOG) 0.5 %, Apply topically 2 (two) times daily. APPLY TOPICALLY TO THE AFFECTED AREA TWICE DAILY, Disp: 30 g, Rfl: 2   valACYclovir (VALTREX) 500 MG tablet, TAKE 1 TABLET(500 MG) BY MOUTH DAILY, Disp: 90 tablet, Rfl: 1   venlafaxine XR (EFFEXOR-XR) 150 MG 24 hr capsule, TAKE 1 CAPSULE(150 MG) BY MOUTH DAILY WITH BREAKFAST, Disp: 90 capsule, Rfl: 3   venlafaxine XR (EFFEXOR-XR) 75 MG 24 hr capsule, Take 1 capsule (75 mg total) by mouth daily with breakfast., Disp: 90 capsule, Rfl: 1 No current facility-administered medications for this visit.  Facility-Administered Medications Ordered in Other Visits:    sodium chloride flush (NS) 0.9 % injection 10 mL, 10 mL, Intravenous, PRN, Janetta Hora, PA-C, 20 mL at 07/16/21 1025  Clinical Interview:   The following information was obtained during a clinical interview with Julia [name] prior to cognitive testing.  Cognitive Symptoms: Decreased short-term memory: Endorsed. Previously, symptoms were generalized in nature with Julia [name] primarily emphasizing that memory capabilities seemed "different" than what she is used to. She alluded to some difficulties with recalling details of conversations and names; however, these were more described as sporadic rather than consistent weaknesses. Currently, she reported instances where she may enter a room and forget her original intention. She denied any "dramatic" changes relative to her previous July 2022  evaluation.  Decreased long-term memory: Denied. Decreased attention/concentration: Denied. Reduced processing speed: Denied. Difficulties with executive functions: Denied. Difficulties with emotion regulation: Denied. She also denied any prominent personality changes.  Difficulties with receptive language: Denied. Difficulties with word finding: Denied. Decreased visuoperceptual ability: Denied.   Difficulties completing ADLs: Denied.  Additional Medical History: History of traumatic brain injury/concussion: Denied. History of stroke: Denied. There is a history of a prior TIA as described above. Symptoms have not been present since her PFO correction.  History of seizure activity: Denied. History of known exposure to toxins: Denied. Symptoms of chronic pain: Endorsed. She reported a history of severe rheumatoid arthritis but has noticed benefit from taking Rituxan to treat these symptoms. Experience of frequent headaches/migraines: Denied. Frequent instances of dizziness/vertigo: Denied.   Sensory changes: She wears reading glasses with positive effect. She previously reported potential developing cataracts but stated that these had not yet impacted her visual acuity. Other sensory changes/difficulties (e.g., hearing, taste, or smell) were denied.  Balance/coordination difficulties: Denied. She did report a few recent falls due to tripping over  things in her environment. These were as recent as 07/20/2023 and 08/09/2023 per head CT records. She has been working with a trainer three days a week and emphasizing balance and core strength lately with some benefit.  Other motor difficulties: Denied.  Sleep History: Estimated hours obtained each night: 6 hours.  Difficulties falling asleep: Denied. Difficulties staying asleep: Endorsed. She described occasional trouble falling back asleep whenever she wakes during the night.  Feels rested and refreshed upon awakening: Endorsed.   History of  snoring: Denied. History of waking up gasping for air: Denied. Witnessed breath cessation while asleep: Denied.   History of vivid dreaming: Denied. Excessive movement while asleep: Denied. Instances of acting out her dreams: Denied.  Psychiatric/Behavioral Health History: Depression: Previously, she acknowledged symptoms of both depression and anxiety stemming from her divorce proceedings and adjustment to being single. Prior to surprise divorce proceedings, she denied any mental health concerns or diagnoses and described herself as "pretty even-keeled." Currently, she acknowledged some feelings of loneliness. She did describe a neighbor and some friends whom she engages with socially. She has been prescribed Effexor but was unsure if it was effective. Current or remote suicidal ideation, intent, or plan was denied.  Anxiety: She noted experiencing some generalized anxious distress at times. Prior to divorce proceedings, there were no diagnosed anxiety-based conditions.  Mania: Denied. Trauma History: Denied. Visual/auditory hallucinations: Denied. Delusional thoughts: Denied.   Tobacco: Denied. Alcohol: She reported, on average, consuming two glasses of low-alcohol wine nightly. She denied a history of problematic alcohol abuse or dependence.  Recreational drugs: Denied.  Family History: Problem Relation Age of Onset   Hypertension Mother    Heart disease Mother    Heart failure Mother    Hypertension Father    Ulcers Father    Gallstones Father    Kidney Stones Father    Hypertension Sister    Hypertension Brother    Heart attack Maternal Grandmother    Colon cancer Neg Hx    Stomach cancer Neg Hx    Esophageal cancer Neg Hx    Alzheimer's disease Neg Hx    Dementia Neg Hx    This information was confirmed by Ms. Monica Martinez.  Academic/Vocational History: Highest level of educational attainment: 12 years. She graduated from high school, describing herself as a good (mostly A)  student in academic settings. No relative weaknesses were identified.  History of developmental delay: Denied. History of grade repetition: Denied. Enrollment in special education courses: Denied. History of LD/ADHD: Denied.   Employment: Retired. 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   Evaluation Results:   Behavioral Observations: Julia [name] was unaccompanied, arrived to her appointment on time, and was appropriately dressed and groomed. She appeared alert. Observed gait and station were within normal limits. Gross motor functioning appeared intact upon informal observation and no abnormal movements (e.g., tremors) were noted. Her affect was generally relaxed and positive. Spontaneous speech was fluent and word finding difficulties were not observed during the clinical interview. Thought processes were coherent, organized, and normal in content. Insight into her cognitive difficulties appeared adequate.   During testing, sustained attention was appropriate. Task engagement was adequate and she persisted when challenged. Overall, Julia [name] was cooperative with the clinical interview and subsequent testing procedures.   Adequacy of Effort: The validity of neuropsychological testing is limited by the extent to which the individual being tested may be assumed to have exerted adequate effort during  testing. Julia [name] expressed her intention to perform to the best of her abilities and exhibited adequate task engagement and persistence. Scores across stand-alone and embedded performance validity measures were within expectation. As such, the results of the current evaluation are believed to be a valid representation of Ms. Britten's current cognitive functioning.  Test Results: Ms. Kilner was fully oriented at the time of the current evaluation.  Intellectual abilities based upon educational and vocational attainment were  estimated to be in the average range. Premorbid abilities were estimated to be within the average range based upon a single-word reading test.   Processing speed was average to above average. Basic attention was well above average. More complex attention (e.g., working memory) was also well above average. Executive functioning was mildly variable but overall appropriate, ranging from the below average to well above average normative ranges.  While not directly assessed, receptive language abilities were believed to be intact. Ms. Brazee did not exhibit any difficulties comprehending task instructions and answered all questions asked of her appropriately. Assessed expressive language (e.g., verbal fluency and confrontation naming) was average to well above average.     Assessed visuospatial/visuoconstructional abilities were average to above average.    Learning (i.e., encoding) of novel verbal and visual information was average to exceptionally high. Spontaneous delayed recall (i.e., retrieval) of previously learned information was also average to exceptionally high. Retention rates were 90% across a list learning task, 88% across a story learning task, 100% across a daily living task, and 100% across a shape learning task. Performance across recognition tasks was above average, suggesting evidence for information consolidation.   Results of emotional screening instruments suggested that recent symptoms of generalized anxiety were in the mild range, while symptoms of depression were within normal limits. A screening instrument assessing recent sleep quality suggested the presence of mild sleep dysfunction.  Tables of Scores:   Note: This summary of test scores accompanies the interpretive report and should not be considered in isolation without reference to the appropriate sections in the text. Descriptors are based on appropriate normative data and may be adjusted based on clinical judgment. Terms such  as "Within Normal Limits" and "Outside Normal Limits" are used when a more specific description of the test score cannot be determined. Descriptors refer to the current evaluation only.        Percentile - Normative Descriptor > 98 - Exceptionally High 91-97 - Well Above Average 75-90 - Above Average 25-74 - Average 9-24 - Below Average 2-8 - Well Below Average < 2 - Exceptionally Low        Validity: July 2022 Current  DESCRIPTOR        DCT: --- --- --- Within Normal Limits  NAB EVI: --- --- --- Within Normal Limits        Orientation:       Raw Score Raw Score Percentile   NAB Orientation, Form 1 29/29 29/29 --- ---        Cognitive Screening:       Raw Score Raw Score Percentile   SLUMS: 27/30 30/30 --- ---        Intellectual Functioning:       Standard Score Standard Score Percentile   Test of Premorbid Functioning: 101 99 47 Average        Memory:      NAB Memory Module, Form 1: Standard Score/ T Score Standard Score/ T Score Percentile   Total Memory Index 110 131 98 Exceptionally High  List  Learning        Total Trials 1-3 25/36 (57) 29/36 (68) 96 Well Above Average    List B 5/12 (57) 3/12 (45) 31 Average    Short Delay Free Recall 7/12 (51) 10/12 (65) 93 Well Above Average    Long Delay Free Recall 8/12 (56) 9/12 (61) 86 Above Average    Retention Percentage 114 (56) 90 (49) 46 Average    Recognition Discriminability 5 (43) 10 (59) 82 Above Average  Shape Learning        Total Trials 1-3 18/27 (60) 21/27 (69) 97 Well Above Average    Delayed Recall 5/9 (49) 8/9 (70) 98 Exceptionally High    Retention Percentage 63 (39) 100 (50) 50 Average    Recognition Discriminability 9 (62) 8 (57) 75 Above Average  Story Learning        Immediate Recall 53/80 (43) 57/80 (47) 38 Average    Delayed Recall 30/40 (47) 30/40 (48) 42 Average    Retention Percentage 91 (51) 88 (48) 42 Average  Daily Living Memory        Immediate Recall 49/51 (71) 50/51 (76) >99 Exceptionally  High    Delayed Recall 17/17 (63) 17/17 (63) 91 Well Above Average    Retention Percentage 100 (59) 100 (60) 84 Above Average    Recognition Hits 10/10 (60) 10/10 (61) 86 Above Average        Attention/Executive Function:      Trail Making Test (TMT): Raw Score (T Score) Raw Score (T Score) Percentile     Part A 31 secs.,  0 errors (53) 31 secs.,  0 errors (55) 69 Average    Part B 60 secs.,  0 errors (60) 55 secs.,  0 errors (67) 96 Well Above Average          Scaled Score Scaled Score Percentile   WAIS-IV Coding: 13 13 84 Above Average        NAB Attention Module, Form 1: T Score T Score Percentile     Digits Forward 67 68 96 Well Above Average    Digits Backwards 66 67 96 Well Above Average         Scaled Score Scaled Score Percentile   WAIS-IV Similarities: --- 10 50 Average        D-KEFS Color-Word Interference Test: Raw Score (Scaled Score) Raw Score (Scaled Score) Percentile     Color Naming 32 secs. (10) 35 secs. (9) 37 Average    Word Reading 22 secs. (11) 24 secs. (11) 63 Average    Inhibition 57 secs. (12) 61 secs. (12) 75 Above Average      Total Errors 2 errors (10) 2 errors (11) 63 Average    Inhibition/Switching 63 secs. (12) 71 secs. (11) 63 Average      Total Errors 3 errors (10) 6 errors (7) 16 Below Average        D-KEFS Verbal Fluency Test: Raw Score (Scaled Score) Raw Score (Scaled Score) Percentile     Letter Total Correct 52 (15) 49 (14) 91 Well Above Average    Category Total Correct 38 (11) 36 (11) 63 Average    Category Switching Total Correct 13 (11) 13 (11) 63 Average    Category Switching Accuracy 12 (11) 11 (10) 50 Average      Total Set Loss Errors 1 (11) 1 (11) 63 Average      Total Repetition Errors 2 (11) 2 (11) 63 Average        D-KEFS  20 Questions Test: Scaled Score Scaled Score Percentile     Total Weighted Achievement Score 15 12 75 Above Average    Initial Abstraction Score 15 11 63 Average        Language:      Verbal Fluency  Test: Raw Score (T Score) Raw Score (T Score) Percentile     Phonemic Fluency (FAS) 52 (61) 49 (58) 79 Above Average    Animal Fluency 17 (48) 17 (49) 46 Average         NAB Language Module, Form 1: T Score T Score Percentile     Naming 30/31 (57) 29/31 (48) 42 Average        Visuospatial/Visuoconstruction:       Raw Score Raw Score Percentile   Clock Drawing: 10/10 10/10 --- Within Normal Limits        NAB Spatial Module, Form 1: T Score T Score Percentile     Figure Drawing Copy 60 57 75 Above Average         Scaled Score Scaled Score Percentile   WAIS-IV Block Design: 11 8 25  Average  WAIS-IV Matrix Reasoning: 10 13 84 Above Average        Mood and Personality:       Raw Score Raw Score Percentile   Beck Depression Inventory - II: 19 10 --- Within Normal Limits  PROMIS Anxiety Questionnaire: 18 19 --- Mild        Additional Questionnaires:       Raw Score Raw Score Percentile   PROMIS Sleep Disturbance Questionnaire: 31 26 --- Mild   Informed Consent and Coding/Compliance:   The current evaluation represents a clinical evaluation for the purposes previously outlined by the referral source and is in no way reflective of a forensic evaluation.   Ms. Krack was provided with a verbal description of the nature and purpose of the present neuropsychological evaluation. Also reviewed were the foreseeable risks and/or discomforts and benefits of the procedure, limits of confidentiality, and mandatory reporting requirements of this provider. The patient was given the opportunity to ask questions and receive answers about the evaluation. Oral consent to participate was provided by the patient.   This evaluation was conducted by Newman Nickels, Ph.D., ABPP-CN, board certified clinical neuropsychologist. Ms. Heinrich completed a clinical interview with Dr. Milbert Coulter, billed as one unit [phone], and 145 minutes of cognitive testing and scoring, billed as one unit [phone] and four additional units [id number].  Psychometrist Wallace Keller, B.S. assisted Dr. Milbert Coulter with test administration and scoring procedures. As a separate and discrete service, one unit [id number] and two units [phone] were billed for Dr. Tammy Sours time spent in interpretation and report writing.

## 2023-09-11 NOTE — Progress Notes (Signed)
   Psychometrician Note   Cognitive testing was administered to [name] by Wallace Keller, B.S. (psychometrist) under the supervision of Dr. Newman Nickels, Ph.D., ABPP, licensed psychologist on 09/11/2023. Ms. Donovan did not appear overtly distressed by the testing session per behavioral observation or responses across self-report questionnaires. Rest breaks were offered.    The battery of tests administered was selected by Dr. Newman Nickels, Ph.D., ABPP with consideration to Ms. Mahany's current level of functioning, the nature of her symptoms, emotional and behavioral responses during interview, level of literacy, observed level of motivation/effort, and the nature of the referral question. This battery was communicated to the psychometrist. Communication between Dr. Newman Nickels, Ph.D., ABPP and the psychometrist was ongoing throughout the evaluation and Dr. Newman Nickels, Ph.D., ABPP was immediately accessible at all times. Dr. Newman Nickels, Ph.D., ABPP provided supervision to the psychometrist on the date of this service to the extent necessary to assure the quality of all services provided.    Rosena Bartle will return within approximately 1-2 weeks for an interactive feedback session with Dr. Milbert Coulter at which time her test performances, clinical impressions, and treatment recommendations will be reviewed in detail. Ms. Melby understands she can contact our office should she require our assistance before this time.  A total of 145 minutes of billable time were spent face-to-face with Ms. Drennen by the psychometrist. This includes both test administration and scoring time. Billing for these services is reflected in the clinical report generated by Dr. Newman Nickels, Ph.D., ABPP  This note reflects time spent with the psychometrician and does not include test scores or any clinical interpretations made by Dr. Milbert Coulter. The full report will follow in a separate note.

## 2023-09-14 ENCOUNTER — Other Ambulatory Visit: Payer: Self-pay | Admitting: Internal Medicine

## 2023-09-14 ENCOUNTER — Telehealth: Payer: Self-pay | Admitting: Gastroenterology

## 2023-09-14 ENCOUNTER — Encounter: Payer: Self-pay | Admitting: Internal Medicine

## 2023-09-14 DIAGNOSIS — N6489 Other specified disorders of breast: Secondary | ICD-10-CM

## 2023-09-14 DIAGNOSIS — Z1231 Encounter for screening mammogram for malignant neoplasm of breast: Secondary | ICD-10-CM

## 2023-09-14 NOTE — Telephone Encounter (Signed)
 Maddie, please see note from [place]. Please contact patient to schedule colonoscopy appointment for AFTER her office visit. Thanks

## 2023-09-14 NOTE — Telephone Encounter (Addendum)
 Inbound call from patient stating that she is scheduled to see Hyacinth Meeker on 3/14 at 10:00. Patient wanted to make sure that she was aware that she has had diarrhea everyday for the last 2-3 months. Patient states her PCP told he she needs to have a colonoscopy and she is wanting to make sure she does not have to wait months to have an appointment for the procedure. Please advise.

## 2023-09-15 ENCOUNTER — Telehealth: Payer: Self-pay | Admitting: Physician Assistant

## 2023-09-15 DIAGNOSIS — R194 Change in bowel habit: Secondary | ICD-10-CM

## 2023-09-15 NOTE — Telephone Encounter (Signed)
 Stool study orders in epic.

## 2023-09-15 NOTE — Telephone Encounter (Signed)
 Patient has been scheduled for 4/28 at 2:30. Advised patient that when she comes in next week prep medication and instructions will be discussed and she can also check to see if there has been any procedure cancellations.

## 2023-09-15 NOTE — Telephone Encounter (Signed)
 Noted.

## 2023-09-15 NOTE — Addendum Note (Signed)
 Addended by: Marisa Sprinkles on: 09/15/2023 10:34 AM   Modules accepted: Orders

## 2023-09-15 NOTE — Telephone Encounter (Signed)
Thank you Lelon Ikard.

## 2023-09-15 NOTE — Telephone Encounter (Signed)
 09/15/2023 10:00 AM Telephone call  Julia [name] ahead and called the patient given her ongoing concerns for her diarrhea.  We discussed her symptoms.  She has been having soft/runny stools anytime she eats over the past few months and it is starting to worry her.  She has tried over-the-counter Imodium as well as prescribed Lomotil which really have not helped.  No stool studies have been done though.  We discussed that typically a colonoscopy is not the first resort for diarrhea and I would prefer to start with stool studies.  She agreed and we will go ahead and order them so she can pick up containers today and return before her appointment next week.  She was very thankful.  Will order a GI pathogen panel, fecal calprotectin, fecal lactoferrin, fecal pancreatic elastase (sounds like her stools are soft solid not completely loose).  [place]-can you order the stool studies above.  She is coming later today.  Hyacinth Meeker, PA-C

## 2023-09-17 MED ORDER — DIPHENOXYLATE-ATROPINE 2.5-0.025 MG PO TABS
1.0000 | ORAL_TABLET | Freq: Four times a day (QID) | ORAL | 0 refills | Status: DC | PRN
Start: 2023-09-17 — End: 2023-11-13

## 2023-09-18 ENCOUNTER — Other Ambulatory Visit

## 2023-09-18 ENCOUNTER — Encounter: Payer: Medicare Other | Admitting: Psychology

## 2023-09-18 DIAGNOSIS — R194 Change in bowel habit: Secondary | ICD-10-CM

## 2023-09-19 ENCOUNTER — Telehealth: Payer: Self-pay | Admitting: Physician Assistant

## 2023-09-19 LAB — FECAL LACTOFERRIN, QUANT
Fecal Lactoferrin: POSITIVE — AB
MICRO NUMBER:: 16180651
SPECIMEN QUALITY:: ADEQUATE

## 2023-09-19 LAB — GI PROFILE, STOOL, PCR

## 2023-09-19 NOTE — Telephone Encounter (Signed)
 Patient called and stated that Hyacinth Meeker had called her and stated that she was returning her call and that it was important for Victorino Dike to return her call. Please advise.

## 2023-09-19 NOTE — Telephone Encounter (Signed)
 Jasmin with [facility] is calling with a report.  Please call [phone]; Ref #; [id number]. Please advise.

## 2023-09-19 NOTE — Telephone Encounter (Signed)
 Report received from [facility] with positive campylobacter- please advise

## 2023-09-20 ENCOUNTER — Telehealth: Payer: Self-pay | Admitting: Physician Assistant

## 2023-09-20 ENCOUNTER — Other Ambulatory Visit: Payer: Self-pay

## 2023-09-20 LAB — CALPROTECTIN, FECAL: Calprotectin, Fecal: 897 ug/g — ABNORMAL HIGH (ref 0–120)

## 2023-09-20 MED ORDER — AZITHROMYCIN 500 MG PO TABS: 500.0000 mg | ORAL_TABLET | Freq: Every day | ORAL | 0 refills | Status: AC

## 2023-09-20 NOTE — Telephone Encounter (Signed)
 Patient calling requesting to speak with Julia [name] regarding her stool results. Please advise.

## 2023-09-20 NOTE — Telephone Encounter (Signed)
 Julia [name] see the pt questions that she has for you. She would like you to be aware of her concerns and questions before that appointment.

## 2023-09-20 NOTE — Telephone Encounter (Signed)
 The pt has been advised that the results have not been reviewed by Victorino Dike as of yet. We will contact her as soon as able. The pt has been advised of the information and verbalized understanding.

## 2023-09-20 NOTE — Telephone Encounter (Signed)
 09/20/2023 phone call  1:56 PM  Called the patient to discuss Campylobacter positive.  I did try and answer all of the questions that she asked and recent MyChart message.  Told her to call back if she still has questions.  Encouraged her to keep appointment on Friday so we can discuss further.  Hyacinth Meeker, PA-C

## 2023-09-21 ENCOUNTER — Telehealth: Payer: Self-pay | Admitting: Physician Assistant

## 2023-09-21 NOTE — Telephone Encounter (Signed)
 Telephone call  09/21/2023  3:16 PM  Called patient to discuss Campylobacter.  She just started the Azithromycin this morning.  Tells me that she still had a loose stool this morning but again just started her dose this morning.  She is hoping that this will take care of her diarrhea.  She would like to keep her colonoscopy as scheduled at the end of April.  Explained that if for some reason this does not clear up her diarrhea then she needs to let us know.  At that point may need to retest for bacteria and/or retreat with a different antibiotic.  She was thankful for my call.  She will let me know how she is doing over the next 2 to 3 days.  Hyacinth Meeker, PA-C

## 2023-09-21 NOTE — Telephone Encounter (Signed)
 Left message on machine for patient.  Discussed that she could let me know through MyChart if around 320/330 works for her today for a short telephone conversation.  If not then she can let me know through MyChart what time tomorrow would work well for her.  Hyacinth Meeker, PA-C

## 2023-09-22 ENCOUNTER — Ambulatory Visit: Payer: Medicare Other | Admitting: Physician Assistant

## 2023-09-22 ENCOUNTER — Ambulatory Visit (INDEPENDENT_AMBULATORY_CARE_PROVIDER_SITE_OTHER): Payer: Medicare Other | Admitting: Psychology

## 2023-09-22 DIAGNOSIS — F4323 Adjustment disorder with mixed anxiety and depressed mood: Secondary | ICD-10-CM | POA: Diagnosis not present

## 2023-09-22 DIAGNOSIS — R4189 Other symptoms and signs involving cognitive functions and awareness: Secondary | ICD-10-CM | POA: Diagnosis not present

## 2023-09-22 NOTE — Progress Notes (Signed)
   Neuropsychology Feedback Session Julia [name]. [facility] [place] Department of Neurology  Reason for Referral:   Julia [name] is a 73 y.o. right-handed Caucasian female referred by  Naomie Dean, M.D. , to characterize her current cognitive functioning and assist with diagnostic clarity and treatment planning in the context of subjective memory complaints.   Feedback:   [name] completed a comprehensive neuropsychological evaluation on 09/11/2023. Please refer to that encounter for the full report and recommendations. Briefly, results suggested neuropsychological functioning within normal limits relative to age-matched peers. Relative to her previous evaluation in July 2022, no domains exhibited evidence for cognitive decline. In fact, some improvement could be argued across memory testing, list and shape learning performances in particular. Improvements are likely due to a combination of practice effects across testing and some amelioration of psychiatric distress and psychosocial stressors which were more prevalent around the time of previous testing. All other assessed domains exhibited stability over time. Specific to memory, [name] was able to learn novel verbal and visual information efficiently and retain this knowledge after lengthy delays. Overall, memory performance combined with intact performances across other areas of cognitive functioning is not suggestive of Alzheimer's disease. Likewise, her cognitive and behavioral profile is not suggestive of any other form of neurodegenerative illness presently.   [name] was unaccompanied during the current telephone call. She was within her residence while I was within my office. I discussed the limitations of evaluation and management by telemedicine and the availability of in person appointments. [name] expressed her understanding and agreed to proceed. Content of the current session focused on the results of her neuropsychological  evaluation. [name] was given the opportunity to ask questions and her questions were answered. She was encouraged to reach out should additional questions arise. A copy of her report was mailed at the conclusion of the visit.      One unit 96132 (31 minutes) was billed for Dr. Tammy Sours time spent preparing for, conducting, and documenting the current feedback session with [name].

## 2023-09-24 LAB — PANCREATIC ELASTASE, FECAL: Pancreatic Elastase-1, Stool: 224 ug/g (ref >200)

## 2023-10-02 ENCOUNTER — Telehealth: Payer: Self-pay | Admitting: Physician Assistant

## 2023-10-02 ENCOUNTER — Other Ambulatory Visit: Payer: Self-pay

## 2023-10-02 DIAGNOSIS — R197 Diarrhea, unspecified: Secondary | ICD-10-CM

## 2023-10-02 NOTE — Telephone Encounter (Signed)
 Patient is also communicating through My Chart. Julia [name] has advised to repeat the stool studies. Patient has been advised of this by responding to her My Chart message.

## 2023-10-02 NOTE — Telephone Encounter (Signed)
 Inbound call from patient stating she believes intestine infection has came back due to constant diarrhea. Requesting a call back to discuss further. Please advise, thank you.

## 2023-10-04 ENCOUNTER — Other Ambulatory Visit: Payer: Self-pay | Admitting: Internal Medicine

## 2023-10-05 ENCOUNTER — Ambulatory Visit

## 2023-10-05 ENCOUNTER — Other Ambulatory Visit

## 2023-10-05 DIAGNOSIS — Z1231 Encounter for screening mammogram for malignant neoplasm of breast: Secondary | ICD-10-CM

## 2023-10-05 DIAGNOSIS — R197 Diarrhea, unspecified: Secondary | ICD-10-CM | POA: Diagnosis not present

## 2023-10-07 LAB — GI PROFILE, STOOL, PCR
Adenovirus F 40/41: NOT DETECTED
Astrovirus: NOT DETECTED
C difficile toxin A/B: NOT DETECTED
Campylobacter: NOT DETECTED
Cryptosporidium: NOT DETECTED
Cyclospora cayetanensis: NOT DETECTED
Entamoeba histolytica: NOT DETECTED
Enteroaggregative E coli: NOT DETECTED
Enteropathogenic E coli: NOT DETECTED
Enterotoxigenic E coli: NOT DETECTED
Giardia lamblia: NOT DETECTED
Norovirus GI/GII: NOT DETECTED
Plesiomonas shigelloides: NOT DETECTED
Rotavirus A: NOT DETECTED
Salmonella: DETECTED — AB
Sapovirus: NOT DETECTED
Shiga-toxin-producing E coli: NOT DETECTED
Shigella/Enteroinvasive E coli: NOT DETECTED
Vibrio cholerae: NOT DETECTED
Vibrio: NOT DETECTED
Yersinia enterocolitica: NOT DETECTED

## 2023-10-09 ENCOUNTER — Telehealth: Payer: Self-pay | Admitting: Physician Assistant

## 2023-10-09 NOTE — Addendum Note (Signed)
 Addended by: Pincus Sanes on: 10/09/2023 09:04 PM   Modules accepted: Orders

## 2023-10-09 NOTE — Telephone Encounter (Signed)
 Lab [employer] rep named [facility] called and stated that she need to give Smiley Houseman nurse a message regarding this patient Salmonella results. [employer] rep stated that the Salmonella results came back as detected. [employer] rep will fax over the results for the test. A good call back number is [phone] press option 1. Please advise.

## 2023-10-09 NOTE — Telephone Encounter (Signed)
 Confirmed receipt of the lab results.  Will send to [facility] to review when back in the office. Also, pt advised via My Chart we will contact her when reviewed

## 2023-10-10 ENCOUNTER — Other Ambulatory Visit: Payer: Self-pay

## 2023-10-10 MED ORDER — CIPROFLOXACIN HCL 500 MG PO TABS: 500.0000 mg | ORAL_TABLET | Freq: Two times a day (BID) | ORAL | 0 refills | Status: DC

## 2023-10-10 NOTE — Telephone Encounter (Signed)
Patient called requesting to speak with nurse regarding results. Please advise. ?

## 2023-10-10 NOTE — Telephone Encounter (Signed)
 It is under the 3/27 lab tab.

## 2023-10-10 NOTE — Telephone Encounter (Signed)
 See pt advise request dated 10/10/23 for further communication.

## 2023-10-10 NOTE — Telephone Encounter (Signed)
 I got it and have spoken to the pt. Thank you

## 2023-10-13 ENCOUNTER — Telehealth: Payer: Self-pay

## 2023-10-13 NOTE — Telephone Encounter (Signed)
 Julia [name]  Patient of Dr Adela Lank seen by Hyacinth Meeker. She is being treated for positive salmonella stool test.   Sent:10/13/2023  1:35 PM EDT        WU:[id number] Results Message Message List   Subject:Message about your results  Hi Julia [name] and nurse Julia [name]. Today's day 4 for me being on Cipro and I do feel better than I did when I began. I've got something happening, though. While I'mnot having a lot of diarrhea per se, but there is like an orange grease coming out of my bowels. I haven't eaten anything greasy and I just wondered is this a side effect of either salmonella or Cipro? I just wanted to keep you informed and ask those questions. Thank you, Julia [name]. Julia [name].

## 2023-10-16 ENCOUNTER — Other Ambulatory Visit: Payer: Self-pay | Admitting: Internal Medicine

## 2023-10-16 DIAGNOSIS — R928 Other abnormal and inconclusive findings on diagnostic imaging of breast: Secondary | ICD-10-CM

## 2023-10-20 ENCOUNTER — Other Ambulatory Visit: Payer: Self-pay | Admitting: Internal Medicine

## 2023-10-20 ENCOUNTER — Ambulatory Visit
Admission: RE | Admit: 2023-10-20 | Discharge: 2023-10-20 | Disposition: A | Discharge status: Home / Self Care | Source: Ambulatory Visit | Attending: Internal Medicine | Admitting: Internal Medicine

## 2023-10-20 DIAGNOSIS — R928 Other abnormal and inconclusive findings on diagnostic imaging of breast: Secondary | ICD-10-CM

## 2023-10-20 DIAGNOSIS — Z9882 Breast implant status: Secondary | ICD-10-CM | POA: Diagnosis not present

## 2023-10-20 DIAGNOSIS — N6489 Other specified disorders of breast: Secondary | ICD-10-CM

## 2023-10-23 ENCOUNTER — Other Ambulatory Visit: Payer: Self-pay | Admitting: Internal Medicine

## 2023-10-28 DIAGNOSIS — S065XAA Traumatic subdural hemorrhage with loss of consciousness status unknown, initial encounter: Secondary | ICD-10-CM | POA: Diagnosis not present

## 2023-10-28 DIAGNOSIS — S0101XA Laceration without foreign body of scalp, initial encounter: Secondary | ICD-10-CM | POA: Diagnosis not present

## 2023-10-28 DIAGNOSIS — S066XAA Traumatic subarachnoid hemorrhage with loss of consciousness status unknown, initial encounter: Secondary | ICD-10-CM | POA: Diagnosis not present

## 2023-10-28 DIAGNOSIS — W1830XA Fall on same level, unspecified, initial encounter: Secondary | ICD-10-CM | POA: Diagnosis not present

## 2023-10-28 DIAGNOSIS — S22039A Unspecified fracture of third thoracic vertebra, initial encounter for closed fracture: Secondary | ICD-10-CM | POA: Diagnosis not present

## 2023-10-28 DIAGNOSIS — W19XXXA Unspecified fall, initial encounter: Secondary | ICD-10-CM | POA: Diagnosis not present

## 2023-10-28 DIAGNOSIS — M546 Pain in thoracic spine: Secondary | ICD-10-CM | POA: Diagnosis not present

## 2023-10-28 DIAGNOSIS — S066X0A Traumatic subarachnoid hemorrhage without loss of consciousness, initial encounter: Secondary | ICD-10-CM | POA: Diagnosis not present

## 2023-10-28 DIAGNOSIS — R402252 Coma scale, best verbal response, oriented, at arrival to emergency department: Secondary | ICD-10-CM | POA: Diagnosis not present

## 2023-10-28 DIAGNOSIS — R402362 Coma scale, best motor response, obeys commands, at arrival to emergency department: Secondary | ICD-10-CM | POA: Diagnosis not present

## 2023-10-28 DIAGNOSIS — Z043 Encounter for examination and observation following other accident: Secondary | ICD-10-CM | POA: Diagnosis not present

## 2023-10-28 DIAGNOSIS — S22049A Unspecified fracture of fourth thoracic vertebra, initial encounter for closed fracture: Secondary | ICD-10-CM | POA: Diagnosis not present

## 2023-10-28 DIAGNOSIS — S065X0A Traumatic subdural hemorrhage without loss of consciousness, initial encounter: Secondary | ICD-10-CM | POA: Diagnosis not present

## 2023-10-28 DIAGNOSIS — R402142 Coma scale, eyes open, spontaneous, at arrival to emergency department: Secondary | ICD-10-CM | POA: Diagnosis not present

## 2023-10-28 DIAGNOSIS — S22038A Other fracture of third thoracic vertebra, initial encounter for closed fracture: Secondary | ICD-10-CM | POA: Diagnosis not present

## 2023-10-28 DIAGNOSIS — Z049 Encounter for examination and observation for unspecified reason: Secondary | ICD-10-CM | POA: Diagnosis not present

## 2023-10-28 DIAGNOSIS — R9431 Abnormal electrocardiogram [ECG] [EKG]: Secondary | ICD-10-CM | POA: Diagnosis not present

## 2023-10-28 DIAGNOSIS — S02119A Unspecified fracture of occiput, initial encounter for closed fracture: Secondary | ICD-10-CM | POA: Diagnosis not present

## 2023-10-28 DIAGNOSIS — S22030A Wedge compression fracture of third thoracic vertebra, initial encounter for closed fracture: Secondary | ICD-10-CM | POA: Diagnosis not present

## 2023-10-28 DIAGNOSIS — S0211GA Other fracture of occiput, right side, initial encounter for closed fracture: Secondary | ICD-10-CM | POA: Diagnosis not present

## 2023-10-29 DIAGNOSIS — M4316 Spondylolisthesis, lumbar region: Secondary | ICD-10-CM | POA: Diagnosis not present

## 2023-10-29 DIAGNOSIS — S0101XA Laceration without foreign body of scalp, initial encounter: Secondary | ICD-10-CM | POA: Diagnosis not present

## 2023-10-29 DIAGNOSIS — S22039A Unspecified fracture of third thoracic vertebra, initial encounter for closed fracture: Secondary | ICD-10-CM | POA: Diagnosis not present

## 2023-10-29 DIAGNOSIS — N189 Chronic kidney disease, unspecified: Secondary | ICD-10-CM | POA: Diagnosis not present

## 2023-10-29 DIAGNOSIS — S22049A Unspecified fracture of fourth thoracic vertebra, initial encounter for closed fracture: Secondary | ICD-10-CM | POA: Diagnosis not present

## 2023-10-29 DIAGNOSIS — B009 Herpesviral infection, unspecified: Secondary | ICD-10-CM | POA: Diagnosis not present

## 2023-10-29 DIAGNOSIS — S066XAA Traumatic subarachnoid hemorrhage with loss of consciousness status unknown, initial encounter: Secondary | ICD-10-CM | POA: Diagnosis not present

## 2023-10-29 DIAGNOSIS — S062XAA Diffuse traumatic brain injury with loss of consciousness status unknown, initial encounter: Secondary | ICD-10-CM | POA: Diagnosis not present

## 2023-10-29 DIAGNOSIS — Z23 Encounter for immunization: Secondary | ICD-10-CM | POA: Diagnosis not present

## 2023-10-29 DIAGNOSIS — S02119A Unspecified fracture of occiput, initial encounter for closed fracture: Secondary | ICD-10-CM | POA: Diagnosis not present

## 2023-10-29 DIAGNOSIS — S066X0A Traumatic subarachnoid hemorrhage without loss of consciousness, initial encounter: Secondary | ICD-10-CM | POA: Diagnosis not present

## 2023-10-29 DIAGNOSIS — S22040A Wedge compression fracture of fourth thoracic vertebra, initial encounter for closed fracture: Secondary | ICD-10-CM | POA: Diagnosis not present

## 2023-10-29 DIAGNOSIS — M069 Rheumatoid arthritis, unspecified: Secondary | ICD-10-CM | POA: Diagnosis not present

## 2023-10-29 DIAGNOSIS — S065X0A Traumatic subdural hemorrhage without loss of consciousness, initial encounter: Secondary | ICD-10-CM | POA: Diagnosis not present

## 2023-10-29 DIAGNOSIS — E876 Hypokalemia: Secondary | ICD-10-CM | POA: Diagnosis not present

## 2023-10-29 DIAGNOSIS — Z8774 Personal history of (corrected) congenital malformations of heart and circulatory system: Secondary | ICD-10-CM | POA: Diagnosis not present

## 2023-10-29 DIAGNOSIS — Z8673 Personal history of transient ischemic attack (TIA), and cerebral infarction without residual deficits: Secondary | ICD-10-CM | POA: Diagnosis not present

## 2023-10-29 DIAGNOSIS — A6000 Herpesviral infection of urogenital system, unspecified: Secondary | ICD-10-CM | POA: Diagnosis not present

## 2023-10-29 DIAGNOSIS — I129 Hypertensive chronic kidney disease with stage 1 through stage 4 chronic kidney disease, or unspecified chronic kidney disease: Secondary | ICD-10-CM | POA: Diagnosis not present

## 2023-10-29 DIAGNOSIS — S22030A Wedge compression fracture of third thoracic vertebra, initial encounter for closed fracture: Secondary | ICD-10-CM | POA: Diagnosis not present

## 2023-10-29 DIAGNOSIS — E039 Hypothyroidism, unspecified: Secondary | ICD-10-CM | POA: Diagnosis not present

## 2023-10-29 DIAGNOSIS — S0211AA Type I occipital condyle fracture, right side, initial encounter for closed fracture: Secondary | ICD-10-CM | POA: Diagnosis not present

## 2023-10-29 DIAGNOSIS — I10 Essential (primary) hypertension: Secondary | ICD-10-CM | POA: Diagnosis not present

## 2023-10-29 DIAGNOSIS — W1830XA Fall on same level, unspecified, initial encounter: Secondary | ICD-10-CM | POA: Diagnosis not present

## 2023-10-31 ENCOUNTER — Encounter: Payer: Self-pay | Admitting: Certified Registered Nurse Anesthetist

## 2023-10-31 ENCOUNTER — Telehealth: Payer: Self-pay

## 2023-10-31 DIAGNOSIS — S22070S Wedge compression fracture of T9-T10 vertebra, sequela: Secondary | ICD-10-CM

## 2023-10-31 DIAGNOSIS — I609 Nontraumatic subarachnoid hemorrhage, unspecified: Secondary | ICD-10-CM

## 2023-10-31 DIAGNOSIS — S065XAA Traumatic subdural hemorrhage with loss of consciousness status unknown, initial encounter: Secondary | ICD-10-CM

## 2023-10-31 DIAGNOSIS — S22030S Wedge compression fracture of third thoracic vertebra, sequela: Secondary | ICD-10-CM

## 2023-10-31 NOTE — Telephone Encounter (Signed)
 Copied from CRM [id number]. Topic: Referral - Request for Referral >> Oct 31, 2023  3:26 PM Adonis Hoot wrote: Did the patient discuss referral with their provider in the last year? No (If No - schedule appointment) (If Yes - send message)  Appointment offered? No  Type of order/referral and detailed reason for visit: neurosurgeon- fall in [place]  on Saturday night was in ICU,hematoma and hemorrhage,hairline fracture  Preference of office, provider, location: [facility] [address]  [place]  Fax#:[phone] [place] ext [phone] Contact #: [phone]  If referral order, have you been seen by this specialty before? No (If Yes, this issue or another issue? When? Where?  Can we respond through MyChart? Yes

## 2023-11-01 ENCOUNTER — Other Ambulatory Visit: Payer: Self-pay

## 2023-11-01 DIAGNOSIS — S22070A Wedge compression fracture of T9-T10 vertebra, initial encounter for closed fracture: Secondary | ICD-10-CM | POA: Insufficient documentation

## 2023-11-01 DIAGNOSIS — S22030A Wedge compression fracture of third thoracic vertebra, initial encounter for closed fracture: Secondary | ICD-10-CM | POA: Insufficient documentation

## 2023-11-01 DIAGNOSIS — M810 Age-related osteoporosis without current pathological fracture: Secondary | ICD-10-CM

## 2023-11-01 MED ORDER — DENOSUMAB 60 MG/ML ~~LOC~~ SOSY
60.0000 mg | PREFILLED_SYRINGE | Freq: Once | SUBCUTANEOUS | Status: DC
Start: 2023-11-01 — End: 2024-02-10

## 2023-11-01 NOTE — Telephone Encounter (Signed)
 She may be able to have access to the CT scan from the ED-not sure if the ED notes will be enough, but if not then yes set up virtual visit.  She may also be able to try and schedule an appointment-not sure if official referral is needed

## 2023-11-01 NOTE — Telephone Encounter (Signed)
 Referral ordered  She is also overdue for Prolia  which we were doing every 6 months-and that we are looking into prior authorization in January-can we check on that

## 2023-11-01 NOTE — Telephone Encounter (Signed)
 Order placed for Prolia  today.

## 2023-11-02 ENCOUNTER — Telehealth: Payer: Self-pay

## 2023-11-02 DIAGNOSIS — I609 Nontraumatic subarachnoid hemorrhage, unspecified: Secondary | ICD-10-CM | POA: Insufficient documentation

## 2023-11-02 DIAGNOSIS — S22000A Wedge compression fracture of unspecified thoracic vertebra, initial encounter for closed fracture: Secondary | ICD-10-CM | POA: Insufficient documentation

## 2023-11-02 DIAGNOSIS — S129XXA Fracture of neck, unspecified, initial encounter: Secondary | ICD-10-CM | POA: Insufficient documentation

## 2023-11-02 DIAGNOSIS — S065XAA Traumatic subdural hemorrhage with loss of consciousness status unknown, initial encounter: Secondary | ICD-10-CM | POA: Insufficient documentation

## 2023-11-02 NOTE — Patient Instructions (Signed)
 Julia [name]

## 2023-11-02 NOTE — Telephone Encounter (Signed)
 Pt ready for scheduling for PROLIA  on or after : 11/02/23  Option# 1: Buy/Bill (Office supplied medication)  Out-of-pocket cost due at time of clinic visit: $0  Number of injection/visits approved: ---  Primary: MEDICARE Prolia  co-insurance: 0% Admin fee co-insurance: 0%  Secondary: UHC AARP-MEDSUP Prolia  co-insurance:  Admin fee co-insurance:   Medical Benefit Details: Date Benefits were checked: 11/02/23 Deductible: $257 Met of $257 Required/ Coinsurance: 0%/ Admin Fee: 0%  Prior Auth: N/A PA# Expiration Date:   # of doses approved: ----------------------------------------------------------------------- Option# 2- Med Obtained from pharmacy:  Pharmacy benefit: Copay $--- (Paid to pharmacy) Admin Fee: --- (Pay at clinic)  Prior Auth: N/A PA# Expiration Date:   # of doses approved:   If patient wants fill through the pharmacy benefit please send prescription to:  --- , and include estimated need by date in rx notes. Pharmacy will ship medication directly to the office.  Patient NOT eligible for Prolia  Copay Card. Copay Card can make patient's cost as little as $25. Link to apply: https://www.amgensupportplus.[email or web address]/copay  ** This summary of benefits is an estimation of the patient's out-of-pocket cost. Exact cost may very based on individual plan coverage.

## 2023-11-02 NOTE — Telephone Encounter (Signed)
 Mychart read by patient.

## 2023-11-02 NOTE — Telephone Encounter (Signed)
 Julia [name]

## 2023-11-02 NOTE — Telephone Encounter (Signed)
 Prolia VOB initiated via [email or web address]  Next Prolia inj DUE: NOW

## 2023-11-02 NOTE — Progress Notes (Unsigned)
 Virtual Visit via Video Note  I connected with Julia [name] on 11/02/23 at  1:00 PM EDT by a video enabled telemedicine application and verified that I am speaking with the correct person using two identifiers.   I discussed the limitations of evaluation and management by telemedicine and the availability of in person appointments. The patient expressed understanding and agreed to proceed.  Present for the visit:  Myself, Dr Oma Bias, Praise Uselman.  The patient is currently at home and I am in the office.    No referring provider.    History of Present Illness: She is here for an acute visit.    Weekend she was in [place]  for a funeral.  She stepped off a curb and fell backwards and hit her head and upper back.  She did have a lot of bleeding because she is on Eliquis  and she was taken to the emergency room.  She was found to have a subdural hematoma and subarachnoid hemorrhage.  She also had 2 minor fractures and cervical disc 3 and cervical disc 4.  She was in the ICU 2 days.  She has a cast around her upper body neck.  Eliquis  was discontinued.  She was advised to follow-up with the neurosurgeon here for repeat CT scan in about 4 weeks.  She was advised not to start Eliquis  until she was cleared by neurosurgery.  She has requested the scans be sent to [facility] directly.   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[employer]  Tobacco Use   Smoking status: Never   Smokeless tobacco: Never  Vaping Use   Vaping status: Never Used  Substance and Sexual Activity   Alcohol use: Yes    Alcohol/week: 7.0 - 14.0 standard drinks of alcohol    Types: 7 - 14 Glasses of wine per week    Comment: 1-2 glasses of wine nightly   Drug use: Never   Sexual  activity: Not on file  Other Topics Concern   Not on file  Social History Narrative   Retired 07/10/17 from [employer]   No regular exercise      Lives alone   Caffeine: 2 glasses/day diet dr pepper   Social Drivers of Health   Financial Resource Strain: Low Risk  (08/30/2023)   Overall Financial Resource Strain (CARDIA)    Difficulty of Paying Living Expenses: Not very hard  Food Insecurity: No Food Insecurity (08/30/2023)   Hunger Vital Sign    Worried About Running Out of Food in the Last Year: Never true    Ran Out of Food in the Last Year: Never true  Transportation Needs: No Transportation Needs (08/30/2023)   PRAPARE - Administrator, Civil Service (Medical): No    Lack of Transportation (Non-Medical): No  Physical Activity: Sufficiently Active (08/30/2023)   Exercise Vital Sign    Days of Exercise per Week: 3 days    Minutes of Exercise per Session: 60 min  Stress: No Stress Concern Present (08/30/2023)   [employer] of Occupational Health - Occupational Stress Questionnaire    Feeling of Stress : Not at all  Social Connections: Socially Isolated (08/30/2023)   Social Connection and Isolation Panel [NHANES]  Frequency of Communication with Friends and Family: More than three times a week    Frequency of Social Gatherings with Friends and Family: Once a week    Attends Religious Services: Never    Database administrator or Organizations: No    Attends Banker Meetings: Never    Marital Status: Divorced     Observations/Objective: Appears well in NAD   Assessment and Plan:  See Problem List for Assessment and Plan of chronic medical problems.   Follow Up Instructions:    I discussed the assessment and treatment plan with the patient. The patient was provided an opportunity to ask questions and all were answered. The patient agreed with the plan and demonstrated an understanding of the instructions.   The patient was advised to call back  or seek an in-person evaluation if the symptoms worsen or if the condition fails to improve as anticipated.    Julia Dauphin, MD

## 2023-11-02 NOTE — Telephone Encounter (Signed)
 Message sent to patient via mychart.  Julia [name] not able to access records from [place].

## 2023-11-03 ENCOUNTER — Other Ambulatory Visit: Payer: Self-pay

## 2023-11-03 ENCOUNTER — Telehealth (INDEPENDENT_AMBULATORY_CARE_PROVIDER_SITE_OTHER): Admitting: Internal Medicine

## 2023-11-03 ENCOUNTER — Telehealth: Payer: Self-pay

## 2023-11-03 ENCOUNTER — Encounter: Payer: Self-pay | Admitting: Internal Medicine

## 2023-11-03 DIAGNOSIS — S065XAA Traumatic subdural hemorrhage with loss of consciousness status unknown, initial encounter: Secondary | ICD-10-CM | POA: Diagnosis not present

## 2023-11-03 DIAGNOSIS — R194 Change in bowel habit: Secondary | ICD-10-CM

## 2023-11-03 DIAGNOSIS — M810 Age-related osteoporosis without current pathological fracture: Secondary | ICD-10-CM | POA: Diagnosis not present

## 2023-11-03 DIAGNOSIS — I609 Nontraumatic subarachnoid hemorrhage, unspecified: Secondary | ICD-10-CM | POA: Diagnosis not present

## 2023-11-03 DIAGNOSIS — R197 Diarrhea, unspecified: Secondary | ICD-10-CM

## 2023-11-03 DIAGNOSIS — S22000A Wedge compression fracture of unspecified thoracic vertebra, initial encounter for closed fracture: Secondary | ICD-10-CM | POA: Diagnosis not present

## 2023-11-03 DIAGNOSIS — S129XXA Fracture of neck, unspecified, initial encounter: Secondary | ICD-10-CM

## 2023-11-03 MED ORDER — SUTAB 1479-225-188 MG PO TABS: ORAL_TABLET | ORAL | 0 refills | Status: DC

## 2023-11-03 NOTE — Assessment & Plan Note (Signed)
 New Fell last weekend and fracture seen on Ct scan - per patient it was T3 and T4 - there was mention of T9 fracture from the report I can see Referral ordered for neurosurgery Will continue back brace until she see them

## 2023-11-03 NOTE — Assessment & Plan Note (Signed)
 Acute Related to fall last weekend Eliquis  stopped - will await clearance by neurosurgery to restart No concerning neurological symptoms today to indicate active bleeding  - advised what she needs to monitor for  - she will call if she develops any concerning symptoms Referral ordered for neuosurgery

## 2023-11-03 NOTE — Assessment & Plan Note (Signed)
 Chronic Resume regular exercise once able Continue calcium and vitamin D  Overdue for Prolia -ordered for next dose -- will check with insurance and get her scheduled ASAP

## 2023-11-03 NOTE — Telephone Encounter (Signed)
 Patient calls because she does not have colonoscopy instructions for the scheduled procedure on 11/06/23. Patient was scheduled on 09/15/23 for the procedure to be on 11/06/23. Her office visit was cancelled on 09/22/23 and she was never instructed. Reviewed medication list. She is off of Eliquis  for a week now by order of her physician due to a recent fall. The patient stopped Ozempic 2 weeks ago. The last injection was more than 7 days ago. Patient understands she cannot have taken these medications if she is to have her colonoscopy as scheduled.  Instructions and ambulatory referral placed in EPIC. Prescription to her pharmacy.

## 2023-11-03 NOTE — Addendum Note (Signed)
 Addended by: [name] on: 11/03/2023 01:26 PM   Modules accepted: Orders

## 2023-11-06 ENCOUNTER — Ambulatory Visit: Admitting: Gastroenterology

## 2023-11-06 ENCOUNTER — Encounter: Payer: Self-pay | Admitting: Gastroenterology

## 2023-11-06 VITALS — BP 112/70 | HR 65 | Temp 98.3°F | Resp 12 | Ht 63.0 in | Wt 120.0 lb

## 2023-11-06 DIAGNOSIS — R194 Change in bowel habit: Secondary | ICD-10-CM | POA: Diagnosis not present

## 2023-11-06 DIAGNOSIS — R197 Diarrhea, unspecified: Secondary | ICD-10-CM

## 2023-11-06 DIAGNOSIS — E039 Hypothyroidism, unspecified: Secondary | ICD-10-CM | POA: Diagnosis not present

## 2023-11-06 DIAGNOSIS — K648 Other hemorrhoids: Secondary | ICD-10-CM | POA: Diagnosis not present

## 2023-11-06 DIAGNOSIS — I10 Essential (primary) hypertension: Secondary | ICD-10-CM | POA: Diagnosis not present

## 2023-11-06 DIAGNOSIS — K5730 Diverticulosis of large intestine without perforation or abscess without bleeding: Secondary | ICD-10-CM | POA: Diagnosis not present

## 2023-11-06 DIAGNOSIS — F418 Other specified anxiety disorders: Secondary | ICD-10-CM | POA: Diagnosis not present

## 2023-11-06 MED ORDER — SODIUM CHLORIDE 0.9 % IV SOLN
500.0000 mL | Freq: Once | INTRAVENOUS | Status: DC
Start: 2023-11-06 — End: 2023-11-06

## 2023-11-06 NOTE — Progress Notes (Signed)
 Report to PACU, RN, vss, BBS= Clear.

## 2023-11-06 NOTE — Telephone Encounter (Signed)
 Copied from CRM [id number]. Topic: General - Other >> Nov 06, 2023  1:10 PM Julia [name] wrote: Reason for CRM: pt called to  speak with carla, requesting referral to neuro surgeon in [place] please call pt back at [phone] [facility]

## 2023-11-06 NOTE — Progress Notes (Signed)
 Called to room to assist during endoscopic procedure.  Patient ID and intended procedure confirmed with present staff. Received instructions for my participation in the procedure from the performing physician.

## 2023-11-06 NOTE — Telephone Encounter (Signed)
 This has been taken care of already for patient and message sent via my-chart.

## 2023-11-06 NOTE — Patient Instructions (Signed)

## 2023-11-06 NOTE — Op Note (Signed)
 [facility] Patient Name: Julia [name] Procedure Date: 11/06/2023 3:11 PM MRN: [id number] Endoscopist: Landon Pinion P. General Kenner , MD, [id number] Age: 73 Referring MD:  Date of Birth: 04/05/1951 Gender: Female Account #: [id number] Procedure:                Colonoscopy Indications:              Diarrhea - elevated fecal calprotectin, positive                            for Campylobacter / Salmonella - improved but still                            having some symptoms, rule out IBD / microscopic                            colitis Medicines:                Monitored Anesthesia Care Procedure:                Pre-Anesthesia Assessment:                           - Prior to the procedure, a History and Physical                            was performed, and patient medications and                            allergies were reviewed. The patient's tolerance of                            previous anesthesia was also reviewed. The risks                            and benefits of the procedure and the sedation                            options and risks were discussed with the patient.                            All questions were answered, and informed consent                            was obtained. Prior Anticoagulants: The patient has                            taken no anticoagulant or antiplatelet agents. ASA                            Grade Assessment: II - A patient with mild systemic                            disease. After reviewing the risks and benefits,  the patient was deemed in satisfactory condition to                            undergo the procedure.                           After obtaining informed consent, the colonoscope                            was passed under direct vision. Throughout the                            procedure, the patient's blood pressure, pulse, and                            oxygen saturations were monitored continuously.  The                            Olympus Scope SN: [id number] was introduced through                            the anus and advanced to the the terminal ileum,                            with identification of the appendiceal orifice and                            IC valve. The colonoscopy was performed without                            difficulty. The patient tolerated the procedure                            well. The quality of the bowel preparation was                            adequate. The terminal ileum, ileocecal valve,                            appendiceal orifice, and rectum were photographed. Scope In: 3:20:55 PM Scope Out: 3:42:00 PM Scope Withdrawal Time: 0 hours 15 minutes 40 seconds  Total Procedure Duration: 0 hours 21 minutes 5 seconds  Findings:                 The perianal and digital rectal examinations were                            normal.                           The terminal ileum appeared normal.                           A few small-mouthed diverticula were found in the  sigmoid colon.                           Internal hemorrhoids were found during                            retroflexion. The hemorrhoids were small.                           The exam was otherwise without abnormality.                           Biopsies for histology were taken with a cold                            forceps from the right colon, left colon and                            transverse colon for evaluation of microscopic                            colitis. Complications:            No immediate complications. Estimated blood loss:                            Minimal. Estimated Blood Loss:     Estimated blood loss was minimal. Impression:               - The examined portion of the ileum was normal.                           - Diverticulosis in the sigmoid colon.                           - Internal hemorrhoids.                           - The examination  was otherwise normal.                           - Biopsies were taken with a cold forceps from the                            right colon, left colon and transverse colon for                            evaluation of microscopic colitis.                           No overt inflammation. Suspect post infectious                            bowel change. Recommendation:           - Patient has a contact number available for  emergencies. The signs and symptoms of potential                            delayed complications were discussed with the                            patient. Return to normal activities tomorrow.                            Written discharge instructions were provided to the                            patient.                           - Resume previous diet.                           - Continue present medications.                           - Await pathology results. Landon Pinion P. Erma Joubert, MD 11/06/2023 3:50:13 PM This report has been signed electronically.

## 2023-11-06 NOTE — Progress Notes (Signed)
 [facility] History and Physical   Primary Care Physician:  [name], MD   Reason for Procedure:   Diarrhea / altered bowel habits  Plan:    colonoscopy     HPI: Julia [name] is a 73 y.o. female  here for colonoscopy - prolonged diarrheal illness, elevated fecal calprotectin to 800s. Stool testing positive for Campylobacter and salmonella in March. Prolonged illness, doing better but not back to baseline.   Last colonoscopy 02/2022 however could not clear cecum on that exam due to prep. Here for colonoscopy to further evaluate, rule out active inflammation, clear colon in regards to polyps as well.   No family history of colon cancer known. Otherwise feels well without any cardiopulmonary symptoms. She is OFF Eliquis  at this time, had a fall a few weeks ago, reported small SAH - she will be off anticoagulation for a period of time, she does not expect any surgery, denies headaches, feels at baseline without complaints.   I have discussed risks / benefits of anesthesia and endoscopic procedure with Julia [name] and they wish to proceed with the exams as outlined today.    Past Medical History:  Diagnosis Date   Adjustment disorder with mixed anxiety and depressed mood    Age-related osteoporosis without current pathological fracture 01/14/2017   07/2018 - spine  -2.3,  RFN  -1.9,  LFN  -2.3   - improvement in hips  dexa 07/25/17 - spine  -3.1,   RFN  -2.2,  LFN  -2.4  - started on prolia   DEXA 05/13/2010 in [place] , osteopenia   Spine: -2.3   LFN  -1.7   RFN:  -1.8      FRAX  15%, 1.9%        Aortic atherosclerosis 01/20/2022   Noted on abdominal CT 12/2021     Arthritis    Body mass index (BMI) 31.0-31.9, adult    Confusional arousals 09/06/2022   Constipation 12/22/2021   Elevated LFTs 07/15/2022   Fatigue    GERD (gastroesophageal reflux disease) 01/13/2017   Hemangioma 04/18/2017   History of breast augmentation    History of colon polyps 01/14/2017   Last  colonoscopy 2018 - no polyps - advised 5 years   History of gastric bypass    History of pelvis fracture    in 30's   History of tuberculosis exposure 01/14/2017   Hx of latent TB, treated for 9 months   Hyperglycemia 09/28/2020   Hypertension    Hyponatremia 07/15/2022   Hypothyroidism 01/14/2017   Laceration of right hand without foreign body 07/24/2023   Melanocytic nevi, unspecified 04/18/2017   Menopause 12/25/2019   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   Osteoporosis 01/14/2017   07/2018 - spine  -2.3,  RFN  -1.9,  LFN  -2.3   - improvement in hips dexa 07/25/17 - spine  -3.1,   RFN  -2.2,  LFN  -2.4  - started on prolia  DEXA 05/13/2010 in [place] , osteopenia   Spine: -2.3   LFN  -1.7   RFN:  -1.8     FRAX  15%, 1.9%    Paresthesia 07/17/2018   Paroxysmal atrial fibrillation 09/29/2020   PFO with atrial septal aneurysm 06/19/2020   repaired in 05/27/2020   Plant dermatitis 01/08/2020   PND (post-nasal drip) 11/08/2022   Pneumonia    Positive QuantiFERON-TB Gold test    Purpura 04/18/2017   Rheumatoid arthritis 01/13/2017   Following at [facility] rheumatology  Has been on Prednisone , Plaquenil, Arava, Humira,  etolodac, Enbrel, Orencia, Remicade and Rituxan      Sebaceous cyst 04/18/2017   Spells of decreased attentiveness 09/06/2022   Steatorrhea 11/08/2022   Subjective memory complaints 07/24/2023   TIA (transient ischemic attack) 04/22/2020   Ventral hernia without obstruction or gangrene 11/01/2019   Noted on Ct scan 10/2019   Vitamin D  deficiency 06/22/2017    Past Surgical History:  Procedure Laterality Date   APPENDECTOMY     AUGMENTATION MAMMAPLASTY     breast enhancement removal Bilateral 03/2022   BREAST ENHANCEMENT SURGERY Bilateral    BUBBLE STUDY  05/27/2020   Procedure: BUBBLE STUDY;  Surgeon: Harrold Lincoln, MD;  Location: [facility] ENDOSCOPY;  Service: Cardiovascular;;   GASTRIC BYPASS     PATENT FORAMEN OVALE(PFO) CLOSURE N/A 06/19/2020   Procedure:  PATENT FORAMEN OVALE (PFO) CLOSURE;  Surgeon: Arnoldo Lapping, MD;  Location: [facility] INVASIVE CV LAB;  Service: Cardiovascular;  Laterality: N/A;   TEE WITHOUT CARDIOVERSION N/A 05/27/2020   Procedure: TRANSESOPHAGEAL ECHOCARDIOGRAM (TEE);  Surgeon: Harrold Lincoln, MD;  Location: [facility] ENDOSCOPY;  Service: Cardiovascular;  Laterality: N/A;   TONSILLECTOMY      Prior to Admission medications   Medication Sig Start Date End Date Taking? Authorizing Provider  amLODipine  (NORVASC ) 2.5 MG tablet TAKE 1 TABLET(2.5 MG) BY MOUTH DAILY 04/17/23  Yes Burns, Beckey Bourgeois, MD  Ascorbic Acid (VITAMIN C PO) Take 3,200 mg by mouth daily. 1600 mg each   Yes [provider]  Calcium Citrate-Vitamin D  (CALCIUM + D PO) Take 1 capsule by mouth 1 day or 1 dose.   Yes [provider]  Cholecalciferol (VITAMIN D3) 250 MCG (10000 UT) TABS Take 5,000 Units by mouth 2 (two) times daily.    Yes [provider]  Coenzyme Q10 (COQ-10) 100 MG CAPS Take 200 mg by mouth daily.   Yes [provider]  Oksana Bergamo Oil 1000 MG CAPS Take 1,000 mg by mouth daily.   Yes [provider]  levothyroxine  (SYNTHROID ) 25 MCG tablet TAKE 1 TABLET(25 MCG) BY MOUTH DAILY 10/04/23  Yes Burns, Beckey Bourgeois, MD  Misc Natural Products ([employer] GLUCOS-CHONDROIT-MSM TS) TABS Take 2 tablets by mouth at bedtime.   Yes [provider]  Multiple Vitamins-Minerals (CENTRUM SILVER PO) Take 1 tablet by mouth daily.    Yes [provider]  Sodium Sulfate-Mag Sulfate-KCl (SUTAB) [phone] MG TABS Use as directed for colonoscopy. MANUFACTURER CODES!! BIN: [id number] PCN: CN GROUP: [id number] MEMBER ID: [id number];VHQ AS SECONDARY INSURANCE ;NO PRIOR AUTHORIZATION 11/03/23  Yes Montana Bryngelson, Lendon Queen, MD  valACYclovir  (VALTREX ) 500 MG tablet TAKE 1 TABLET(500 MG) BY MOUTH DAILY 03/23/23  Yes Burns, Beckey Bourgeois, MD  venlafaxine  XR (EFFEXOR -XR) 150 MG 24 hr capsule TAKE 1 CAPSULE(150 MG) BY MOUTH DAILY WITH BREAKFAST 05/19/23  Yes  Burns, Beckey Bourgeois, MD  venlafaxine  XR (EFFEXOR -XR) 75 MG 24 hr capsule TAKE 1 CAPSULE(75 MG) BY MOUTH DAILY WITH BREAKFAST 10/23/23  Yes Burns, Beckey Bourgeois, MD  diphenoxylate -atropine  (LOMOTIL ) 2.5-0.025 MG tablet Take 1-2 tablets by mouth 4 (four) times daily as needed for diarrhea or loose stools. 09/17/23   [name], MD  ELIQUIS  5 MG TABS tablet TAKE 1 TABLET(5 MG) BY MOUTH TWICE DAILY 09/07/23   Cooper, Michael, MD  naltrexone  (DEPADE) 50 MG tablet Take by mouth daily.    [provider]  ondansetron  (ZOFRAN ) 4 MG tablet     [provider]  riTUXimab  (RITUXAN ) 500 MG/50ML injection Inject 10 mg into the vein every 6 (six) months.  [provider]  triamcinolone  ointment (KENALOG ) 0.5 % Apply topically 2 (two) times daily. APPLY TOPICALLY TO THE AFFECTED AREA TWICE DAILY 07/27/23   [name], MD    Current Outpatient Medications  Medication Sig Dispense Refill   amLODipine  (NORVASC ) 2.5 MG tablet TAKE 1 TABLET(2.5 MG) BY MOUTH DAILY 90 tablet 3   Ascorbic Acid (VITAMIN C PO) Take 3,200 mg by mouth daily. 1600 mg each     Calcium Citrate-Vitamin D  (CALCIUM + D PO) Take 1 capsule by mouth 1 day or 1 dose.     Cholecalciferol (VITAMIN D3) 250 MCG (10000 UT) TABS Take 5,000 Units by mouth 2 (two) times daily.      Coenzyme Q10 (COQ-10) 100 MG CAPS Take 200 mg by mouth daily.     Krill Oil 1000 MG CAPS Take 1,000 mg by mouth daily.     levothyroxine  (SYNTHROID ) 25 MCG tablet TAKE 1 TABLET(25 MCG) BY MOUTH DAILY 90 tablet 3   Misc Natural Products (CVS GLUCOS-CHONDROIT-MSM TS) TABS Take 2 tablets by mouth at bedtime.     Multiple Vitamins-Minerals (CENTRUM SILVER PO) Take 1 tablet by mouth daily.      Sodium Sulfate-Mag Sulfate-KCl (SUTAB) [phone] MG TABS Use as directed for colonoscopy. MANUFACTURER CODES!! BIN: [id number] PCN: CN GROUP: [id number] MEMBER ID: [id number];VFI AS SECONDARY INSURANCE ;NO PRIOR AUTHORIZATION 24 tablet 0   valACYclovir  (VALTREX ) 500 MG  tablet TAKE 1 TABLET(500 MG) BY MOUTH DAILY 90 tablet 1   venlafaxine  XR (EFFEXOR -XR) 150 MG 24 hr capsule TAKE 1 CAPSULE(150 MG) BY MOUTH DAILY WITH BREAKFAST 90 capsule 3   venlafaxine  XR (EFFEXOR -XR) 75 MG 24 hr capsule TAKE 1 CAPSULE(75 MG) BY MOUTH DAILY WITH BREAKFAST 90 capsule 1   diphenoxylate -atropine  (LOMOTIL ) 2.5-0.025 MG tablet Take 1-2 tablets by mouth 4 (four) times daily as needed for diarrhea or loose stools. 60 tablet 0   ELIQUIS  5 MG TABS tablet TAKE 1 TABLET(5 MG) BY MOUTH TWICE DAILY 180 tablet 1   naltrexone  (DEPADE) 50 MG tablet Take by mouth daily.     ondansetron  (ZOFRAN ) 4 MG tablet      riTUXimab  (RITUXAN ) 500 MG/50ML injection Inject 10 mg into the vein every 6 (six) months.     triamcinolone  ointment (KENALOG ) 0.5 % Apply topically 2 (two) times daily. APPLY TOPICALLY TO THE AFFECTED AREA TWICE DAILY 30 g 2   Current Facility-Administered Medications  Medication Dose Route Frequency Provider Last Rate Last Admin   0.9 %  sodium chloride  infusion  500 mL Intravenous Once Manjot Hinks, Lendon Queen, MD       denosumab  (PROLIA ) injection 60 mg  60 mg Subcutaneous Once Burns, Stacy J, MD       Facility-Administered Medications Ordered in Other Visits  Medication Dose Route Frequency Provider Last Rate Last Admin   sodium chloride  flush (NS) 0.9 % injection 10 mL  10 mL Intravenous PRN Ardia Kraft, PA-C   20 mL at 07/16/21 1025    Allergies as of 11/06/2023   (No Known Allergies)    Family History  Problem Relation Age of Onset   Hypertension Mother    Heart disease Mother    Heart failure Mother    Hypertension Father    Ulcers Father    Gallstones Father    Kidney Stones Father    Hypertension Sister    Hypertension Brother    Heart attack Maternal Grandmother    Colon cancer Neg Hx    Stomach cancer Neg Hx  Esophageal cancer Neg Hx    Alzheimer's disease Neg Hx    Dementia Neg Hx     Social History   Socioeconomic History   Marital status:  Divorced    Spouse name: Not on file   Number of children: 0   Years of education: 12   Highest education level: High school graduate  Occupational History   Occupation: retired    Comment: president of [employer]  Tobacco Use   Smoking status: Never   Smokeless tobacco: Never  Vaping Use   Vaping status: Never Used  Substance and Sexual Activity   Alcohol use: Yes    Alcohol/week: 7.0 - 14.0 standard drinks of alcohol    Types: 7 - 14 Glasses of wine per week    Comment: 1-2 glasses of wine nightly   Drug use: Never   Sexual activity: Not on file  Other Topics Concern   Not on file  Social History Narrative   Retired 07/10/17 from [employer]   No regular exercise      Lives alone   Caffeine: 2 glasses/day diet dr pepper   Social Drivers of Health   Financial Resource Strain: Low Risk  (08/30/2023)   Overall Financial Resource Strain (CARDIA)    Difficulty of Paying Living Expenses: Not very hard  Food Insecurity: No Food Insecurity (08/30/2023)   Hunger Vital Sign    Worried About Running Out of Food in the Last Year: Never true    Ran Out of Food in the Last Year: Never true  Transportation Needs: No Transportation Needs (08/30/2023)   PRAPARE - Administrator, Civil Service (Medical): No    Lack of Transportation (Non-Medical): No  Physical Activity: Sufficiently Active (08/30/2023)   Exercise Vital Sign    Days of Exercise per Week: 3 days    Minutes of Exercise per Session: 60 min  Stress: No Stress Concern Present (08/30/2023)   [employer] of Occupational Health - Occupational Stress Questionnaire    Feeling of Stress : Not at all  Social Connections: Socially Isolated (08/30/2023)   Social Connection and Isolation Panel [NHANES]    Frequency of Communication with Friends and Family: More than three times a week    Frequency of Social Gatherings with Friends and Family: Once a week    Attends Religious Services: Never    Database administrator or  Organizations: No    Attends Banker Meetings: Never    Marital Status: Divorced  Catering manager Violence: Patient Unable To Answer (08/30/2023)   Humiliation, Afraid, Rape, and Kick questionnaire    Fear of Current or Ex-Partner: Patient unable to answer    Emotionally Abused: Patient unable to answer    Physically Abused: Patient unable to answer    Sexually Abused: Patient unable to answer    Review of Systems: All other review of systems negative except as mentioned in the HPI.  Physical Exam: Vital signs BP (!) 145/74   Pulse 75   Temp 98.3 F (36.8 C) (Temporal)   Ht 5\' 3"  (1.6 m)   Wt 120 lb (54.4 kg)   SpO2 100%   BMI 21.26 kg/m   General:   Alert,  Well-developed, pleasant and cooperative in NAD Lungs:  Clear throughout to auscultation.   Heart:  Regular rate and rhythm Abdomen:  Soft, nontender and nondistended.   Neuro/Psych:  Alert and cooperative. Normal mood and affect. A and O x 3  Christi Coward, MD [facility]

## 2023-11-07 ENCOUNTER — Encounter (HOSPITAL_BASED_OUTPATIENT_CLINIC_OR_DEPARTMENT_OTHER): Payer: Self-pay | Admitting: Emergency Medicine

## 2023-11-07 ENCOUNTER — Emergency Department (HOSPITAL_BASED_OUTPATIENT_CLINIC_OR_DEPARTMENT_OTHER)

## 2023-11-07 ENCOUNTER — Other Ambulatory Visit: Payer: Self-pay

## 2023-11-07 ENCOUNTER — Telehealth: Payer: Self-pay

## 2023-11-07 ENCOUNTER — Emergency Department (HOSPITAL_BASED_OUTPATIENT_CLINIC_OR_DEPARTMENT_OTHER)
Admission: EM | Admit: 2023-11-07 | Discharge: 2023-11-07 | Disposition: A | Discharge status: Home / Self Care | Attending: Emergency Medicine | Admitting: Emergency Medicine

## 2023-11-07 DIAGNOSIS — Z79899 Other long term (current) drug therapy: Secondary | ICD-10-CM | POA: Insufficient documentation

## 2023-11-07 DIAGNOSIS — Z8673 Personal history of transient ischemic attack (TIA), and cerebral infarction without residual deficits: Secondary | ICD-10-CM | POA: Diagnosis not present

## 2023-11-07 DIAGNOSIS — I6200 Nontraumatic subdural hemorrhage, unspecified: Secondary | ICD-10-CM | POA: Diagnosis not present

## 2023-11-07 DIAGNOSIS — E876 Hypokalemia: Secondary | ICD-10-CM | POA: Diagnosis not present

## 2023-11-07 DIAGNOSIS — R519 Headache, unspecified: Secondary | ICD-10-CM | POA: Diagnosis not present

## 2023-11-07 DIAGNOSIS — S0101XA Laceration without foreign body of scalp, initial encounter: Secondary | ICD-10-CM | POA: Diagnosis not present

## 2023-11-07 DIAGNOSIS — I6529 Occlusion and stenosis of unspecified carotid artery: Secondary | ICD-10-CM | POA: Diagnosis not present

## 2023-11-07 DIAGNOSIS — R220 Localized swelling, mass and lump, head: Secondary | ICD-10-CM | POA: Diagnosis not present

## 2023-11-07 DIAGNOSIS — W1830XA Fall on same level, unspecified, initial encounter: Secondary | ICD-10-CM | POA: Insufficient documentation

## 2023-11-07 DIAGNOSIS — Z7901 Long term (current) use of anticoagulants: Secondary | ICD-10-CM | POA: Diagnosis not present

## 2023-11-07 DIAGNOSIS — H1132 Conjunctival hemorrhage, left eye: Secondary | ICD-10-CM | POA: Diagnosis not present

## 2023-11-07 DIAGNOSIS — I6782 Cerebral ischemia: Secondary | ICD-10-CM | POA: Diagnosis not present

## 2023-11-07 DIAGNOSIS — I10 Essential (primary) hypertension: Secondary | ICD-10-CM | POA: Diagnosis not present

## 2023-11-07 LAB — BASIC METABOLIC PANEL WITH GFR
Anion gap: 17 — ABNORMAL HIGH (ref 5–15)
BUN: 9 mg/dL (ref 8–23)
CO2: 21 mmol/L — ABNORMAL LOW (ref 22–32)
Calcium: 9.8 mg/dL (ref 8.9–10.3)
Chloride: 97 mmol/L — ABNORMAL LOW (ref 98–111)
Creatinine, Ser: 0.46 mg/dL (ref 0.44–1.00)
GFR, Estimated: >60 mL/min (ref >60)
Glucose, Bld: 62 mg/dL — ABNORMAL LOW (ref 70–99)
Potassium: 3.2 mmol/L — ABNORMAL LOW (ref 3.5–5.1)
Sodium: 135 mmol/L (ref 135–145)

## 2023-11-07 LAB — CBC WITH DIFFERENTIAL/PLATELET
Abs Immature Granulocytes: 0.01 10*3/uL (ref 0.00–0.07)
Basophils Absolute: 0.1 10*3/uL (ref 0.0–0.1)
Basophils Relative: 1 %
Eosinophils Absolute: 0.1 10*3/uL (ref 0.0–0.5)
Eosinophils Relative: 1 %
HCT: 36.9 % (ref 36.0–46.0)
Hemoglobin: 12.4 g/dL (ref 12.0–15.0)
Immature Granulocytes: 0 %
Lymphocytes Relative: 15 %
Lymphs Abs: 0.9 10*3/uL (ref 0.7–4.0)
MCH: 32.1 pg (ref 26.0–34.0)
MCHC: 33.6 g/dL (ref 30.0–36.0)
MCV: 95.6 fL (ref 80.0–100.0)
Monocytes Absolute: 0.9 10*3/uL (ref 0.1–1.0)
Monocytes Relative: 15 %
Neutro Abs: 4.1 10*3/uL (ref 1.7–7.7)
Neutrophils Relative %: 68 %
Platelets: 342 10*3/uL (ref 150–400)
RBC: 3.86 MIL/uL — ABNORMAL LOW (ref 3.87–5.11)
RDW: 12.5 % (ref 11.5–15.5)
WBC: 6.1 10*3/uL (ref 4.0–10.5)
nRBC: 0 % (ref 0.0–0.2)

## 2023-11-07 LAB — CBG MONITORING, ED: Glucose-Capillary: 74 mg/dL (ref 70–99)

## 2023-11-07 MED ORDER — IOHEXOL 350 MG/ML SOLN
75.0000 mL | Freq: Once | INTRAVENOUS | Status: AC | PRN
  Administered 2023-11-07: 75 mL via INTRAVENOUS

## 2023-11-07 MED ORDER — POTASSIUM CHLORIDE CRYS ER 20 MEQ PO TBCR
40.0000 meq | EXTENDED_RELEASE_TABLET | Freq: Once | ORAL | Status: AC
  Administered 2023-11-07: 40 meq via ORAL
  Filled 2023-11-07: qty 2

## 2023-11-07 NOTE — ED Provider Notes (Signed)
 [facility] Provider Note   CSN: [id number] Arrival date & time: 11/07/23  1930     History  Chief Complaint  Patient presents with   Fall   Headache    Julia [name] is a 73 y.o. female.  Patient is a 73 year old female with a history of hypertension, paroxysmal atrial fibrillation, PFO with atrial septal aneurysm, TIA, rheumatoid arthritis who presents with a headache.  She states on April 19 she had a fall.  She tripped on a curb and fell backward hitting the back of her head on the pavement.  She went to a local hospital which was in [place]  and was diagnosed with a subdural hematoma and subarachnoid hemorrhage.  She also had what she describes as small T3/T4 fractures.  She was placed in a brace, presumably a TLSO but unclear.  She says she has been doing well.  She has not really had much of a headache at all.  Today about 5 hours ago she developed a headache.  Its gotten worse throughout the day.  It is in the right side of her head.  She has had a little nausea but no vomiting.  No trouble with her balance.  No numbness or weakness to her extremities.  She was on Eliquis  but stopped it when she was admitted for the intracranial hemorrhage.  She was told not to restart it until she follows up with a local neurosurgeon here which she is supposed to do May 15.       Home Medications Prior to Admission medications   Medication Sig Start Date End Date Taking? Authorizing Provider  amLODipine  (NORVASC ) 2.5 MG tablet TAKE 1 TABLET(2.5 MG) BY MOUTH DAILY 04/17/23   [name], MD  Ascorbic Acid (VITAMIN C PO) Take 3,200 mg by mouth daily. 1600 mg each    [provider]  Calcium Citrate-Vitamin D  (CALCIUM + D PO) Take 1 capsule by mouth 1 day or 1 dose.    [provider]  Cholecalciferol (VITAMIN D3) 250 MCG (10000 UT) TABS Take 5,000 Units by mouth 2 (two) times daily.     [provider]  Coenzyme Q10  (COQ-10) 100 MG CAPS Take 200 mg by mouth daily.    [provider]  diphenoxylate -atropine  (LOMOTIL ) 2.5-0.025 MG tablet Take 1-2 tablets by mouth 4 (four) times daily as needed for diarrhea or loose stools. 09/17/23   [name], MD  ELIQUIS  5 MG TABS tablet TAKE 1 TABLET(5 MG) BY MOUTH TWICE DAILY 09/07/23   Cooper, Michael, MD  Krill Oil 1000 MG CAPS Take 1,000 mg by mouth daily.    [provider]  levothyroxine  (SYNTHROID ) 25 MCG tablet TAKE 1 TABLET(25 MCG) BY MOUTH DAILY 10/04/23   Burns, Beckey Bourgeois, MD  Misc Natural Products ([employer] GLUCOS-CHONDROIT-MSM TS) TABS Take 2 tablets by mouth at bedtime.    [provider]  Multiple Vitamins-Minerals (CENTRUM SILVER PO) Take 1 tablet by mouth daily.     [provider]  naltrexone  (DEPADE) 50 MG tablet Take by mouth daily.    [provider]  ondansetron  (ZOFRAN ) 4 MG tablet     [provider]  riTUXimab  (RITUXAN ) 500 MG/50ML injection Inject 10 mg into the vein every 6 (six) months.    [provider]  Sodium Sulfate-Mag Sulfate-KCl (SUTAB) 1479-225-188 MG TABS Use as directed for colonoscopy. MANUFACTURER CODES!! BIN: [id number] PCN: CN GROUP: [id number] MEMBER ID: [id number];XBM AS SECONDARY INSURANCE ;NO  PRIOR AUTHORIZATION 11/03/23   Armbruster, Lendon Queen, MD  triamcinolone  ointment (KENALOG ) 0.5 % Apply topically 2 (two) times daily. APPLY TOPICALLY TO THE AFFECTED AREA TWICE DAILY 07/27/23   [name], MD  valACYclovir  (VALTREX ) 500 MG tablet TAKE 1 TABLET(500 MG) BY MOUTH DAILY 03/23/23   [name], MD  venlafaxine  XR (EFFEXOR -XR) 150 MG 24 hr capsule TAKE 1 CAPSULE(150 MG) BY MOUTH DAILY WITH BREAKFAST 05/19/23   Burns, Beckey Bourgeois, MD  venlafaxine  XR (EFFEXOR -XR) 75 MG 24 hr capsule TAKE 1 CAPSULE(75 MG) BY MOUTH DAILY WITH BREAKFAST 10/23/23   [name], MD      Allergies    Patient has no known allergies.    Review of Systems   Review of Systems  Constitutional:  Negative  for chills, diaphoresis, fatigue and fever.  HENT:  Negative for congestion, rhinorrhea and sneezing.   Eyes: Negative.   Respiratory:  Negative for cough, chest tightness and shortness of breath.   Cardiovascular:  Negative for chest pain and leg swelling.  Gastrointestinal:  Positive for nausea. Negative for abdominal pain, blood in stool, diarrhea and vomiting.  Genitourinary:  Negative for difficulty urinating, flank pain, frequency and hematuria.  Musculoskeletal:  Negative for arthralgias and back pain.  Skin:  Negative for rash.  Neurological:  Positive for headaches. Negative for dizziness, speech difficulty, weakness and numbness.    Physical Exam Updated Vital Signs BP (!) 150/76 (BP Location: Right Arm)   Pulse 66   Temp 98.3 F (36.8 C) (Oral)   Resp 13   Ht 5\' 3"  (1.6 m)   Wt 54.4 kg   SpO2 99%   BMI 21.24 kg/m  Physical Exam Constitutional:      Appearance: She is well-developed.  HENT:     Head: Normocephalic.     Comments: Healing abrasion/superficial laceration to the posterior scalp area, no active bleeding Eyes:     Extraocular Movements: Extraocular movements intact.     Pupils: Pupils are equal, round, and reactive to light.     Comments: Small subconjunctival hemorrhage to the left eye  Cardiovascular:     Rate and Rhythm: Normal rate and regular rhythm.     Heart sounds: Normal heart sounds.  Pulmonary:     Effort: Pulmonary effort is normal. No respiratory distress.     Breath sounds: Normal breath sounds. No wheezing or rales.  Chest:     Chest wall: No tenderness.  Abdominal:     General: Bowel sounds are normal.     Palpations: Abdomen is soft.     Tenderness: There is no abdominal tenderness. There is no guarding or rebound.  Musculoskeletal:        General: Normal range of motion.     Cervical back: Normal range of motion and neck supple.  Lymphadenopathy:     Cervical: No cervical adenopathy.  Skin:    General: Skin is warm and dry.      Findings: No rash.  Neurological:     Mental Status: She is alert and oriented to person, place, and time.     Comments: Motor 5/5 all extremities Sensation grossly intact to LT all extremities  no pronator drift CN II-XII grossly intact       ED Results / Procedures / Treatments   Labs (all labs ordered are listed, but only abnormal results are displayed) Labs Reviewed  BASIC METABOLIC PANEL WITH GFR - Abnormal; Notable for the following components:      Result  Value   Potassium 3.2 (*)    Chloride 97 (*)    CO2 21 (*)    Glucose, Bld 62 (*)    Anion gap 17 (*)    All other components within normal limits  CBC WITH DIFFERENTIAL/PLATELET - Abnormal; Notable for the following components:   RBC 3.86 (*)    All other components within normal limits  CBG MONITORING, ED    EKG None  Radiology CT ANGIO HEAD W OR WO CONTRAST Result Date: 11/07/2023 CLINICAL DATA:  Initial evaluation for acute headache. EXAM: CT ANGIOGRAPHY HEAD TECHNIQUE: Multidetector CT imaging of the head was performed using the standard protocol during bolus administration of intravenous contrast. Multiplanar CT image reconstructions and MIPs were obtained to evaluate the vascular anatomy. RADIATION DOSE REDUCTION: This exam was performed according to the departmental dose-optimization program which includes automated exposure control, adjustment of the mA and/or kV according to patient size and/or use of iterative reconstruction technique. CONTRAST:  75mL OMNIPAQUE  IOHEXOL  350 MG/ML SOLN COMPARISON:  Prior CT from earlier the same day. FINDINGS: CTA HEAD Anterior circulation: Mild atheromatous change about the carotid siphons without hemodynamically significant stenosis or other abnormality. A1 segments patent bilaterally. Normal anterior communicating artery complex. Anterior cerebral arteries patent without stenosis. No M1 stenosis or occlusion. Distal MCA branches perfused and symmetric. No beading or  irregularity. Posterior circulation: Both V4 segments patent without significant stenosis. Left vertebral artery dominant. Right PICA patent. Left PICA origin not well seen. Basilar patent without stenosis. Superior cerebral arteries patent bilaterally. Right PCA supplied via the basilar. Fetal type origin of the left PCA. Both PCAs patent to their distal aspects without significant stenosis. No beading or irregularity. Venous sinuses: Patent allowing for timing the contrast bolus. Anatomic variants: Fetal type right PCA. Dominant left vertebral artery. No intracranial aneurysm or other vascular malformation. Review of the MIP images confirms the above findings. IMPRESSION: Negative CTA of the head. No large vessel occlusion, hemodynamically significant stenosis, or other acute vascular abnormality. No intracranial aneurysm. Electronically Signed   By: Virgia Griffins M.D.   On: 11/07/2023 22:16   CT Head Wo Contrast Result Date: 11/07/2023 EXAM: CT HEAD WITHOUT 11/07/2023 08:08:37 PM TECHNIQUE: CT of the head was performed without the administration of intravenous contrast. Automated exposure control, iterative reconstruction, and/or weight based adjustment of the mA/kV was utilized to reduce the radiation dose to as low as reasonably achievable. COMPARISON: 08/09/2023 CLINICAL HISTORY: Headache, new onset (Age >= 51y); History of recent fall with subdural hematoma and subarachnoid hemorrhage, worsening headache. 72 y/o female. History of recent fall with subdural hematoma and subarachnoid hemorrhage x 10 days ago, worsening headache. FINDINGS: BRAIN AND VENTRICLES: There is no acute intracranial hemorrhage, mass effect or midline shift. No abnormal extra-axial fluid collection. The gray-white differentiation is maintained without evidence of an acute infarct. There is no evidence of hydrocephalus. Mild scattered periventricular and subcortical low density, likely reflecting small vessel ischemic changes.  ORBITS: The visualized portion of the orbits demonstrate no acute abnormality. SINUSES: The visualized paranasal sinuses and mastoid air cells demonstrate no acute abnormality. SOFT TISSUES AND SKULL: Mild soft tissue swelling overlying the right parietal vertex. No acute abnormality of the visualized skull. IMPRESSION: 1. No acute intracranial abnormality. 2. Mild small vessel ischemic changes. Electronically signed by: Zadie Herter MD 11/07/2023 08:18 PM EDT RP Workstation: [id number]    Procedures Procedures    Medications Ordered in ED Medications  iohexol  (OMNIPAQUE ) 350 MG/ML injection 75 mL (75 mLs Intravenous Contrast  Given 11/07/23 2128)  potassium chloride SA (KLOR-CON M) CR tablet 40 mEq (40 mEq Oral Given 11/07/23 2229)    ED Course/ Medical Decision Making/ A&P                                 Medical Decision Making Amount and/or Complexity of Data Reviewed Labs: ordered. Radiology: ordered.  Risk Prescription drug management.   Patient is a 73 year old who presents with an acute headache after she recently had a fall resulting in a subdural hematoma/subarachnoid hemorrhage.  She has not been taking her Eliquis  since that time.  CT scan does not show any acute abnormalities.  CTA was performed to assess for possible aneurysm and this was negative as well.  Labs were unremarkable other than she did have some low potassium.  She was given potassium replacement.  She also had a low glucose.  Will give her something to eat and drink and recheck this.  Overall her headache has subsided without treatment here in the ED.  She did take some Tylenol  prior to arrival.  She has an upcoming appointment with neurosurgery.  She was advised to follow-up with her primary care doctor if her headache continues.  Return precautions were given.  Glucose was 74 on recheck.  She says she has not eaten since breakfast and will eat dinner when she gets home.   Final Clinical Impression(s) / ED  Diagnoses Final diagnoses:  Acute nonintractable headache, unspecified headache type  Hypokalemia    Rx / DC Orders ED Discharge Orders     None         Hershel Los, MD 11/07/23 2237

## 2023-11-07 NOTE — ED Notes (Addendum)
 Pt provided with peanut butter crackers and 240 mL [place] per MD request

## 2023-11-07 NOTE — Discharge Instructions (Addendum)
 Call Dr. Donnette Gal tomorrow for follow-up.  Follow-up with the neurosurgeon as scheduled.  You should have your potassium rechecked at some point to make sure it normalizes.  Return to the emergency room if you have any worsening symptoms.

## 2023-11-07 NOTE — ED Triage Notes (Addendum)
 [place] with subdural hematoma nas Subarachnoid hemorrage. Has developed headache and left eye changes and nausea.

## 2023-11-07 NOTE — Telephone Encounter (Signed)
  Follow up Call-     11/06/2023    2:30 PM 02/11/2022    1:58 PM  Call back number  Post procedure Call Back phone  # [phone]  Permission to leave phone message Yes Yes     Patient questions:  Do you have a fever, pain , or abdominal swelling? No. Pain Score  0 *  Have you tolerated food without any problems? Yes.    Have you been able to return to your normal activities? Yes.    Do you have any questions about your discharge instructions: Diet   No. Medications  No. Follow up visit  No.  Do you have questions or concerns about your Care? No.  Actions: * If pain score is 4 or above: No action needed, pain <4.

## 2023-11-08 ENCOUNTER — Telehealth: Payer: Self-pay

## 2023-11-08 NOTE — Transitions of Care (Post Inpatient/ED Visit) (Signed)
 11/08/2023  Name: Julia [name] MRN: [id number] DOB: May 15, 1951  Today's TOC FU Call Status: Today's TOC FU Call Status:: Successful TOC FU Call Completed TOC FU Call Complete Date: 11/08/23 Patient's Name and Date of Birth confirmed.  Transition Care Management Follow-up Telephone Call Date of Discharge: 11/07/23 Discharge Facility: [facility] Type of Discharge: Emergency Department Reason for ED Visit: Other: (headache) How have you been since you were released from the hospital?: Better Any questions or concerns?: No  Items Reviewed: Did you receive and understand the discharge instructions provided?: Yes Medications obtained,verified, and reconciled?: Yes (Medications Reviewed) Any new allergies since your discharge?: Yes Dietary orders reviewed?: NA Do you have support at home?: Yes People in Home [RPT]: spouse  Medications Reviewed Today: Medications Reviewed Today     Reviewed by Darrall Ellison, LPN (Licensed Practical Nurse) on 11/08/23 at 1302  Med List Status: <None>   Medication Order Taking? Sig Documenting Provider Last Dose Status Informant  amLODipine  (NORVASC ) 2.5 MG tablet [id number] No TAKE 1 TABLET(2.5 MG) BY MOUTH DAILY [name], MD 11/05/2023 Active   Ascorbic Acid (VITAMIN C PO) [id number] No Take 3,200 mg by mouth daily. 1600 mg each [provider] 11/05/2023 Active Self           Med Note Verdia Glad, [place] R   Fri Mar 10, 2023  1:51 PM)    Calcium Citrate-Vitamin D  (CALCIUM + D PO) [id number] No Take 1 capsule by mouth 1 day or 1 dose. [provider] 11/05/2023 Active Self           Med Note Dennice Fishman A   Mon May 25, 2020  9:21 AM)    Cholecalciferol (VITAMIN D3) 250 MCG (10000 UT) TABS [id number] No Take 5,000 Units by mouth 2 (two) times daily.  [provider] 11/05/2023 Active Self  Coenzyme Q10 (COQ-10) 100 MG CAPS [id number] No Take 200 mg by mouth daily. [provider] 11/05/2023 Active Self   denosumab  (PROLIA ) injection 60 mg [id number]   [name], MD  Active   diphenoxylate -atropine  (LOMOTIL ) 2.5-0.025 MG tablet [id number]  Take 1-2 tablets by mouth 4 (four) times daily as needed for diarrhea or loose stools. [name], MD  Active   ELIQUIS  5 MG TABS tablet [id number] No TAKE 1 TABLET(5 MG) BY MOUTH TWICE DAILY Arnoldo Lapping, MD 10/27/2023 Active   Krill Oil 1000 MG CAPS [id number] No Take 1,000 mg by mouth daily. [provider] 11/05/2023 Active Self  levothyroxine  (SYNTHROID ) 25 MCG tablet [id number] No TAKE 1 TABLET(25 MCG) BY MOUTH DAILY [name], MD 11/05/2023 Active   Misc Natural Products ([employer] GLUCOS-CHONDROIT-MSM TS) TABS [id number] No Take 2 tablets by mouth at bedtime. [provider] 11/05/2023 Active Self  Multiple Vitamins-Minerals (CENTRUM SILVER PO) [id number] No Take 1 tablet by mouth daily.  [provider] Past Week Active Self  naltrexone  (DEPADE) 50 MG tablet [id number] No Take by mouth daily. [provider] Taking Active   ondansetron  (ZOFRAN ) 4 MG tablet [id number] No  [provider] Taking Active   riTUXimab  (RITUXAN ) 500 MG/50ML injection [id number] No Inject 10 mg into the vein every 6 (six) months. [provider] Taking Active   Sodium Sulfate-Mag Sulfate-KCl (SUTAB) [phone] MG TABS [id number] No Use as directed for colonoscopy. MANUFACTURER CODES!! BIN: [id number] PCN: CN GROUP: [id number] MEMBER ID: [id number];KDT AS SECONDARY INSURANCE ;NO PRIOR AUTHORIZATION Ace Holder, MD 11/06/2023 Active   triamcinolone  ointment (KENALOG ) 0.5 % [id number] No Apply topically  2 (two) times daily. APPLY TOPICALLY TO THE AFFECTED AREA TWICE DAILY Burns, Beckey Bourgeois, MD Unknown Active   valACYclovir  (VALTREX ) 500 MG tablet [id number] No TAKE 1 TABLET(500 MG) BY MOUTH DAILY [name], MD 11/05/2023 Active   venlafaxine  XR (EFFEXOR -XR) 150 MG 24 hr capsule [id number] No TAKE 1 CAPSULE(150 MG) BY MOUTH DAILY  WITH Caprice Chance, MD 11/05/2023 Active   venlafaxine  XR (EFFEXOR -XR) 75 MG 24 hr capsule [id number] No TAKE 1 CAPSULE(75 MG) BY MOUTH DAILY WITH Caprice Chance, MD 11/05/2023 Active             Home Care and Equipment/Supplies: Were Home Health Services Ordered?: NA Any new equipment or medical supplies ordered?: NA  Functional Questionnaire: Do you need assistance with bathing/showering or dressing?: No Do you need assistance with meal preparation?: No Do you need assistance with eating?: No Do you have difficulty maintaining continence: No Do you need assistance with getting out of bed/getting out of a chair/moving?: No Do you have difficulty managing or taking your medications?: No  Follow up appointments reviewed: PCP Follow-up appointment confirmed?: Yes Date of PCP follow-up appointment?: 11/13/23 Follow-up Provider: [facility] Follow-up appointment confirmed?: NA Do you need transportation to your follow-up appointment?: No Do you understand care options if your condition(s) worsen?: Yes-patient verbalized understanding    SIGNATURE Darrall Ellison, LPN [facility] Nurse Health Advisor Direct Dial [phone]

## 2023-11-09 ENCOUNTER — Encounter: Payer: Self-pay | Admitting: Gastroenterology

## 2023-11-09 LAB — SURGICAL PATHOLOGY

## 2023-11-12 ENCOUNTER — Encounter: Payer: Self-pay | Admitting: Internal Medicine

## 2023-11-12 NOTE — Progress Notes (Unsigned)
 Subjective:    Patient ID: Julia [name], female    DOB: [birth year], 73 y.o.   MRN: [id number]     HPI Julia [name] is here for follow up from the hospital  [place] on 4/19 while in [place].  She tripped on a curb and fell backwards hitting her back and head.  She was found to have subdural hematoma, subarachnoid hemorrhage and T3/4 fractures.  She was in the ICU overnight and discharged home in a brace.  Elquis was stopped and she was advised to only restart when neurosurgery says it is ok.  She did not have any headaches or dizziness/lightheadedness until 4/29.  She did go to the ED for evaluation.  The pain was on the right side of her head.  She had a little nausea but no vomiting, n/t or weakness.   CT and CTA in head were done - both negative.  Labs showed low K.  She is scheduled to see neurosurgery on 5/15.   The headache did resolve after three hours and she has not had a recurrence.  She thinks she may have been dehydrated and had not eaten much due to having her recent colonoscopy.    Denies pain from the thoracic fractures.      Medications and allergies reviewed with patient and updated if appropriate.  Current Outpatient Medications on File Prior to Visit  Medication Sig Dispense Refill   amLODipine  (NORVASC ) 2.5 MG tablet TAKE 1 TABLET(2.5 MG) BY MOUTH DAILY 90 tablet 3   Ascorbic Acid (VITAMIN C PO) Take 3,200 mg by mouth daily. 1600 mg each     Calcium Citrate-Vitamin D  (CALCIUM + D PO) Take 1 capsule by mouth 1 day or 1 dose.     Cholecalciferol (VITAMIN D3) 250 MCG (10000 UT) TABS Take 5,000 Units by mouth 2 (two) times daily.      Coenzyme Q10 (COQ-10) 100 MG CAPS Take 200 mg by mouth daily.     ELIQUIS  5 MG TABS tablet TAKE 1 TABLET(5 MG) BY MOUTH TWICE DAILY 180 tablet 1   Krill Oil 1000 MG CAPS Take 1,000 mg by mouth daily.     levothyroxine  (SYNTHROID ) 25 MCG tablet TAKE 1 TABLET(25 MCG) BY MOUTH DAILY 90 tablet 3   Misc Natural Products ([employer] GLUCOS-CHONDROIT-MSM TS) TABS  Take 2 tablets by mouth at bedtime.     Multiple Vitamins-Minerals (CENTRUM SILVER PO) Take 1 tablet by mouth daily.      riTUXimab  (RITUXAN ) 500 MG/50ML injection Inject 10 mg into the vein every 6 (six) months.     Sodium Sulfate-Mag Sulfate-KCl (SUTAB ) 1479-225-188 MG TABS Use as directed for colonoscopy. MANUFACTURER CODES!! BIN: [id number] PCN: CN GROUP: [id number] MEMBER ID: [id number];[employer] AS SECONDARY INSURANCE ;NO PRIOR AUTHORIZATION 24 tablet 0   triamcinolone  ointment (KENALOG ) 0.5 % Apply topically 2 (two) times daily. APPLY TOPICALLY TO THE AFFECTED AREA TWICE DAILY 30 g 2   valACYclovir  (VALTREX ) 500 MG tablet TAKE 1 TABLET(500 MG) BY MOUTH DAILY 90 tablet 1   venlafaxine  XR (EFFEXOR -XR) 150 MG 24 hr capsule TAKE 1 CAPSULE(150 MG) BY MOUTH DAILY WITH BREAKFAST 90 capsule 3   venlafaxine  XR (EFFEXOR -XR) 75 MG 24 hr capsule TAKE 1 CAPSULE(75 MG) BY MOUTH DAILY WITH BREAKFAST 90 capsule 1   Current Facility-Administered Medications on File Prior to Visit  Medication Dose Route Frequency Provider Last Rate Last Admin   denosumab  (PROLIA ) injection 60 mg  60 mg Subcutaneous Once [name], MD  sodium chloride  flush (NS) 0.9 % injection 10 mL  10 mL Intravenous PRN Ardia Kraft, PA-C   20 mL at 07/16/21 1025     Review of Systems  Eyes:  Negative for visual disturbance.  Gastrointestinal:  Negative for nausea.  Neurological:  Negative for dizziness, weakness, light-headedness, numbness and headaches.       Objective:   Vitals:   11/13/23 0757  BP: 118/80  Pulse: 70  Temp: 98.1 F (36.7 C)  SpO2: 99%   BP Readings from Last 3 Encounters:  11/13/23 118/80  11/07/23 (!) 142/67  11/06/23 112/70   Wt Readings from Last 3 Encounters:  11/13/23 125 lb (56.7 kg)  11/07/23 119 lb 14.9 oz (54.4 kg)  11/06/23 120 lb (54.4 kg)   Body mass index is 22.14 kg/m.    Physical Exam Constitutional:      General: She is not in acute distress.    Appearance: Normal  appearance.  HENT:     Head: Normocephalic.     Comments: Area of redness where she hit her head with small scab - no swelling Eyes:     Conjunctiva/sclera: Conjunctivae normal.  Cardiovascular:     Rate and Rhythm: Normal rate and regular rhythm.     Heart sounds: Normal heart sounds.  Pulmonary:     Effort: Pulmonary effort is normal. No respiratory distress.     Breath sounds: Normal breath sounds. No wheezing.  Musculoskeletal:     Cervical back: Neck supple.     Right lower leg: No edema.     Left lower leg: No edema.  Lymphadenopathy:     Cervical: No cervical adenopathy.  Skin:    General: Skin is warm and dry.     Findings: No rash.  Neurological:     Mental Status: She is alert. Mental status is at baseline.  Psychiatric:        Mood and Affect: Mood normal.        Behavior: Behavior normal.        Lab Results  Component Value Date   WBC 6.1 11/07/2023   HGB 12.4 11/07/2023   HCT 36.9 11/07/2023   PLT 342 11/07/2023   GLUCOSE 62 (L) 11/07/2023   CHOL 201 (H) 01/09/2023   TRIG 56.0 01/09/2023   HDL 117.80 01/09/2023   LDLCALC 72 01/09/2023   ALT 23 01/09/2023   AST 32 01/09/2023   NA 135 11/07/2023   K 3.2 (L) 11/07/2023   CL 97 (L) 11/07/2023   CREATININE 0.46 11/07/2023   BUN 9 11/07/2023   CO2 21 (L) 11/07/2023   TSH 1.62 01/09/2023   INR 1.0 02/14/2020   HGBA1C 4.9 01/09/2023   CT ANGIO HEAD W OR WO CONTRAST CLINICAL DATA:  Initial evaluation for acute headache.  EXAM: CT ANGIOGRAPHY HEAD  TECHNIQUE: Multidetector CT imaging of the head was performed using the standard protocol during bolus administration of intravenous contrast. Multiplanar CT image reconstructions and MIPs were obtained to evaluate the vascular anatomy.  RADIATION DOSE REDUCTION: This exam was performed according to the departmental dose-optimization program which includes automated exposure control, adjustment of the mA and/or kV according to patient size and/or use  of iterative reconstruction technique.  CONTRAST:  75mL OMNIPAQUE  IOHEXOL  350 MG/ML SOLN  COMPARISON:  Prior CT from earlier the same day.  FINDINGS: CTA HEAD  Anterior circulation: Mild atheromatous change about the carotid siphons without hemodynamically significant stenosis or other abnormality. A1 segments patent bilaterally. Normal anterior communicating artery  complex. Anterior cerebral arteries patent without stenosis. No M1 stenosis or occlusion. Distal MCA branches perfused and symmetric. No beading or irregularity.  Posterior circulation: Both V4 segments patent without significant stenosis. Left vertebral artery dominant. Right PICA patent. Left PICA origin not well seen. Basilar patent without stenosis. Superior cerebral arteries patent bilaterally. Right PCA supplied via the basilar. Fetal type origin of the left PCA. Both PCAs patent to their distal aspects without significant stenosis. No beading or irregularity.  Venous sinuses: Patent allowing for timing the contrast bolus.  Anatomic variants: Fetal type right PCA. Dominant left vertebral artery. No intracranial aneurysm or other vascular malformation.  Review of the MIP images confirms the above findings.  IMPRESSION: Negative CTA of the head. No large vessel occlusion, hemodynamically significant stenosis, or other acute vascular abnormality. No intracranial aneurysm.  Electronically Signed   By: Virgia Griffins M.D.   On: 11/07/2023 22:16 CT Head Wo Contrast EXAM: CT HEAD WITHOUT 11/07/2023 08:08:37 PM  TECHNIQUE: CT of the head was performed without the administration of intravenous contrast. Automated exposure control, iterative reconstruction, and/or weight based adjustment of the mA/kV was utilized to reduce the radiation dose to as low as reasonably achievable.  COMPARISON: 08/09/2023  CLINICAL HISTORY: Headache, new onset (Age >= 51y); History of recent fall with subdural  hematoma and subarachnoid hemorrhage, worsening headache. 73 y/o female. History of recent fall with subdural hematoma and subarachnoid hemorrhage x 10 days ago, worsening headache.  FINDINGS:  BRAIN AND VENTRICLES: There is no acute intracranial hemorrhage, mass effect or midline shift. No abnormal extra-axial fluid collection. The gray-white differentiation is maintained without evidence of an acute infarct. There is no evidence of hydrocephalus. Mild scattered periventricular and subcortical low density, likely reflecting small vessel ischemic changes.  ORBITS: The visualized portion of the orbits demonstrate no acute abnormality.  SINUSES: The visualized paranasal sinuses and mastoid air cells demonstrate no acute abnormality.  SOFT TISSUES AND SKULL: Mild soft tissue swelling overlying the right parietal vertex. No acute abnormality of the visualized skull.  IMPRESSION: 1. No acute intracranial abnormality. 2. Mild small vessel ischemic changes.  Electronically signed by: Zadie Herter MD 11/07/2023 08:18 PM EDT RP Workstation: [id number]    Assessment & Plan:    See Problem List for Assessment and Plan of chronic medical problems.

## 2023-11-12 NOTE — Patient Instructions (Signed)
        Medications changes include :   None

## 2023-11-13 ENCOUNTER — Ambulatory Visit (INDEPENDENT_AMBULATORY_CARE_PROVIDER_SITE_OTHER): Admitting: Internal Medicine

## 2023-11-13 VITALS — BP 118/80 | HR 70 | Temp 98.1°F | Ht 63.0 in | Wt 125.0 lb

## 2023-11-13 DIAGNOSIS — S22000A Wedge compression fracture of unspecified thoracic vertebra, initial encounter for closed fracture: Secondary | ICD-10-CM | POA: Diagnosis not present

## 2023-11-13 DIAGNOSIS — S065XAA Traumatic subdural hemorrhage with loss of consciousness status unknown, initial encounter: Secondary | ICD-10-CM | POA: Diagnosis not present

## 2023-11-13 DIAGNOSIS — I10 Essential (primary) hypertension: Secondary | ICD-10-CM

## 2023-11-13 DIAGNOSIS — I609 Nontraumatic subarachnoid hemorrhage, unspecified: Secondary | ICD-10-CM | POA: Diagnosis not present

## 2023-11-13 NOTE — Assessment & Plan Note (Signed)
Chronic Blood pressure is well-controlled Continue amlodipine 2.5 mg daily 

## 2023-11-13 NOTE — Assessment & Plan Note (Signed)
 Acute Related to fall last month Eliquis  still on hold-will restart once cleared by neurosurgery Recent imaging from the emergency room did not show evidence of a hemorrhage To see neurosurgery on the 15th

## 2023-11-13 NOTE — Assessment & Plan Note (Addendum)
 Acute Related to recent fall last month Pain - none  - wears brace  Has upcoming appointment with neurosurgery

## 2023-11-16 NOTE — Telephone Encounter (Signed)
 See 10/10/23 patient message for responses to patient.

## 2023-11-22 DIAGNOSIS — M0609 Rheumatoid arthritis without rheumatoid factor, multiple sites: Secondary | ICD-10-CM | POA: Diagnosis not present

## 2023-11-22 DIAGNOSIS — Z79899 Other long term (current) drug therapy: Secondary | ICD-10-CM | POA: Diagnosis not present

## 2023-11-23 DIAGNOSIS — S066X1A Traumatic subarachnoid hemorrhage with loss of consciousness of 30 minutes or less, initial encounter: Secondary | ICD-10-CM | POA: Diagnosis not present

## 2024-01-11 DIAGNOSIS — H353131 Nonexudative age-related macular degeneration, bilateral, early dry stage: Secondary | ICD-10-CM | POA: Diagnosis not present

## 2024-01-11 DIAGNOSIS — H40013 Open angle with borderline findings, low risk, bilateral: Secondary | ICD-10-CM | POA: Diagnosis not present

## 2024-01-11 DIAGNOSIS — H25813 Combined forms of age-related cataract, bilateral: Secondary | ICD-10-CM | POA: Diagnosis not present

## 2024-01-11 DIAGNOSIS — H04123 Dry eye syndrome of bilateral lacrimal glands: Secondary | ICD-10-CM | POA: Diagnosis not present

## 2024-01-30 ENCOUNTER — Encounter: Payer: Medicare Other | Admitting: Internal Medicine

## 2024-01-31 NOTE — Progress Notes (Unsigned)
 Subjective:    Patient ID: Julia [name], female    DOB: 05-24-1951, 73 y.o.   MRN: [id number]     HPI Julia [name] is here for follow up of her chronic medical problems.  Seeing a trainer.  Overall doing well feels good.  She is eating healthy.  She is exercising regularly.  She has been seeing someone and that is going well.  Medications and allergies reviewed with patient and updated if appropriate.  Current Outpatient Medications on File Prior to Visit  Medication Sig Dispense Refill   amLODipine  (NORVASC ) 2.5 MG tablet TAKE 1 TABLET(2.5 MG) BY MOUTH DAILY 90 tablet 3   Ascorbic Acid (VITAMIN C PO) Take 3,200 mg by mouth daily. 1600 mg each     Cholecalciferol (VITAMIN D3) 250 MCG (10000 UT) TABS Take 5,000 Units by mouth 2 (two) times daily.      Coenzyme Q10 (COQ-10) 100 MG CAPS Take 200 mg by mouth daily.     ELIQUIS  5 MG TABS tablet TAKE 1 TABLET(5 MG) BY MOUTH TWICE DAILY 180 tablet 1   Krill Oil 1000 MG CAPS Take 1,000 mg by mouth daily.     levothyroxine  (SYNTHROID ) 25 MCG tablet TAKE 1 TABLET(25 MCG) BY MOUTH DAILY 90 tablet 3   Misc Natural Products ([employer]S GLUCOS-CHONDROIT-MSM TS) TABS Take 2 tablets by mouth at bedtime.     Multiple Vitamins-Minerals (PRESERVISION AREDS 2+MULTI VIT PO)      riTUXimab  (RITUXAN ) 500 MG/50ML injection Inject 10 mg into the vein every 6 (six) months.     triamcinolone  ointment (KENALOG ) 0.5 % Apply topically 2 (two) times daily. APPLY TOPICALLY TO THE AFFECTED AREA TWICE DAILY 30 g 2   valACYclovir  (VALTREX ) 500 MG tablet TAKE 1 TABLET(500 MG) BY MOUTH DAILY 90 tablet 1   venlafaxine  XR (EFFEXOR -XR) 150 MG 24 hr capsule TAKE 1 CAPSULE(150 MG) BY MOUTH DAILY WITH BREAKFAST 90 capsule 3   venlafaxine  XR (EFFEXOR -XR) 75 MG 24 hr capsule TAKE 1 CAPSULE(75 MG) BY MOUTH DAILY WITH BREAKFAST 90 capsule 1   Current Facility-Administered Medications on File Prior to Visit  Medication Dose Route Frequency Provider Last Rate Last Admin   denosumab   (PROLIA ) injection 60 mg  60 mg Subcutaneous Once Geofm Glade PARAS, MD       sodium chloride  flush (NS) 0.9 % injection 10 mL  10 mL Intravenous PRN Sebastian Collar R, PA-C   20 mL at 07/16/21 1025     Review of Systems  Constitutional:  Negative for fever.  Respiratory:  Negative for cough, shortness of breath and wheezing.   Cardiovascular:  Negative for chest pain, palpitations and leg swelling.  Neurological:  Negative for dizziness, light-headedness and headaches.       Objective:   Vitals:   02/01/24 1115  BP: 108/74  Pulse: 63  Temp: 98.2 F (36.8 C)  SpO2: 97%   BP Readings from Last 3 Encounters:  02/01/24 108/74  11/13/23 118/80  11/07/23 (!) 142/67   Wt Readings from Last 3 Encounters:  02/01/24 123 lb (55.8 kg)  11/13/23 125 lb (56.7 kg)  11/07/23 119 lb 14.9 oz (54.4 kg)   Body mass index is 21.79 kg/m.    Physical Exam Constitutional:      General: She is not in acute distress.    Appearance: Normal appearance.  HENT:     Head: Normocephalic and atraumatic.  Eyes:     Conjunctiva/sclera: Conjunctivae normal.  Cardiovascular:     Rate and  Rhythm: Normal rate and regular rhythm.     Heart sounds: Normal heart sounds.  Pulmonary:     Effort: Pulmonary effort is normal. No respiratory distress.     Breath sounds: Normal breath sounds. No wheezing.  Musculoskeletal:     Cervical back: Neck supple.     Right lower leg: No edema.     Left lower leg: No edema.  Lymphadenopathy:     Cervical: No cervical adenopathy.  Skin:    General: Skin is warm and dry.     Findings: No rash.  Neurological:     Mental Status: She is alert. Mental status is at baseline.  Psychiatric:        Mood and Affect: Mood normal.        Behavior: Behavior normal.        Lab Results  Component Value Date   WBC 6.1 11/07/2023   HGB 12.4 11/07/2023   HCT 36.9 11/07/2023   PLT 342 11/07/2023   GLUCOSE 62 (L) 11/07/2023   CHOL 201 (H) 01/09/2023   TRIG 56.0  01/09/2023   HDL 117.80 01/09/2023   LDLCALC 72 01/09/2023   ALT 23 01/09/2023   AST 32 01/09/2023   NA 135 11/07/2023   K 3.2 (L) 11/07/2023   CL 97 (L) 11/07/2023   CREATININE 0.46 11/07/2023   BUN 9 11/07/2023   CO2 21 (L) 11/07/2023   TSH 1.62 01/09/2023   INR 1.0 02/14/2020   HGBA1C 4.9 01/09/2023     Assessment & Plan:    See Problem List for Assessment and Plan of chronic medical problems.

## 2024-01-31 NOTE — Patient Instructions (Addendum)
      Blood work was ordered.       Medications changes include :   None    A referral was ordered and someone will call you to schedule an appointment.     Return in about 6 months (around 08/03/2024) for follow up.

## 2024-02-01 ENCOUNTER — Ambulatory Visit: Payer: Self-pay | Admitting: Internal Medicine

## 2024-02-01 ENCOUNTER — Ambulatory Visit: Payer: Medicare Other | Admitting: Internal Medicine

## 2024-02-01 VITALS — BP 108/74 | HR 63 | Temp 98.2°F | Ht 63.0 in | Wt 123.0 lb

## 2024-02-01 DIAGNOSIS — I10 Essential (primary) hypertension: Secondary | ICD-10-CM | POA: Diagnosis not present

## 2024-02-01 DIAGNOSIS — E039 Hypothyroidism, unspecified: Secondary | ICD-10-CM

## 2024-02-01 DIAGNOSIS — M810 Age-related osteoporosis without current pathological fracture: Secondary | ICD-10-CM | POA: Diagnosis not present

## 2024-02-01 DIAGNOSIS — F4323 Adjustment disorder with mixed anxiety and depressed mood: Secondary | ICD-10-CM | POA: Diagnosis not present

## 2024-02-01 DIAGNOSIS — E559 Vitamin D deficiency, unspecified: Secondary | ICD-10-CM | POA: Diagnosis not present

## 2024-02-01 DIAGNOSIS — M069 Rheumatoid arthritis, unspecified: Secondary | ICD-10-CM | POA: Diagnosis not present

## 2024-02-01 DIAGNOSIS — I480 Paroxysmal atrial fibrillation: Secondary | ICD-10-CM

## 2024-02-01 LAB — COMPREHENSIVE METABOLIC PANEL WITH GFR
ALT: 26 U/L (ref 0–35)
AST: 34 U/L (ref 0–37)
Albumin: 4.7 g/dL (ref 3.5–5.2)
Alkaline Phosphatase: 67 U/L (ref 39–117)
BUN: 13 mg/dL (ref 6–23)
CO2: 32 meq/L (ref 19–32)
Calcium: 9.6 mg/dL (ref 8.4–10.5)
Chloride: 95 meq/L — ABNORMAL LOW (ref 96–112)
Creatinine, Ser: 0.43 mg/dL (ref 0.40–1.20)
GFR: 98.3 mL/min (ref >60.00)
Glucose, Bld: 88 mg/dL (ref 70–99)
Potassium: 3.9 meq/L (ref 3.5–5.1)
Sodium: 134 meq/L — ABNORMAL LOW (ref 135–145)
Total Bilirubin: 0.5 mg/dL (ref 0.2–1.2)
Total Protein: 7 g/dL (ref 6.0–8.3)

## 2024-02-01 LAB — CBC WITH DIFFERENTIAL/PLATELET
Basophils Absolute: 0.1 K/uL (ref 0.0–0.1)
Basophils Relative: 1.4 % (ref 0.0–3.0)
Eosinophils Absolute: 0.1 K/uL (ref 0.0–0.7)
Eosinophils Relative: 1.3 % (ref 0.0–5.0)
HCT: 40 % (ref 36.0–46.0)
Hemoglobin: 13.4 g/dL (ref 12.0–15.0)
Lymphocytes Relative: 16.6 % (ref 12.0–46.0)
Lymphs Abs: 0.9 K/uL (ref 0.7–4.0)
MCHC: 33.5 g/dL (ref 30.0–36.0)
MCV: 95.5 fl (ref 78.0–100.0)
Monocytes Absolute: 1 K/uL (ref 0.1–1.0)
Monocytes Relative: 16.9 % — ABNORMAL HIGH (ref 3.0–12.0)
Neutro Abs: 3.6 K/uL (ref 1.4–7.7)
Neutrophils Relative %: 63.8 % (ref 43.0–77.0)
Platelets: 338 K/uL (ref 150.0–400.0)
RBC: 4.19 Mil/uL (ref 3.87–5.11)
RDW: 13.8 % (ref 11.5–15.5)
WBC: 5.7 K/uL (ref 4.0–10.5)

## 2024-02-01 LAB — VITAMIN D 25 HYDROXY (VIT D DEFICIENCY, FRACTURES): VITD: 55.78 ng/mL (ref 30.00–100.00)

## 2024-02-01 LAB — TSH: TSH: 1.33 u[IU]/mL (ref 0.35–5.50)

## 2024-02-01 MED ORDER — DENOSUMAB 60 MG/ML ~~LOC~~ SOSY
60.0000 mg | PREFILLED_SYRINGE | Freq: Once | SUBCUTANEOUS | Status: AC
Start: 2024-02-15 — End: 2024-02-01
  Administered 2024-02-01: 60 mg via SUBCUTANEOUS

## 2024-02-01 MED ORDER — DENOSUMAB 60 MG/ML ~~LOC~~ SOSY: 60.0000 mg | PREFILLED_SYRINGE | Freq: Once | SUBCUTANEOUS | Status: DC

## 2024-02-01 NOTE — Assessment & Plan Note (Signed)
 Chronic Taking vitamin D  check vitamin D  level

## 2024-02-01 NOTE — Assessment & Plan Note (Addendum)
 Chronic Controlled Management per [facility] On Rituxan  2 / year

## 2024-02-01 NOTE — Assessment & Plan Note (Signed)
 Chronic Lab Results  Component Value Date   TSH 1.62 01/09/2023   Clinically euthyroid Check TSH Continue levothyroxine  25 mcg daily

## 2024-02-01 NOTE — Assessment & Plan Note (Addendum)
 Chronic Continue regular exercise Continue calcium and vitamin D  Overdue for Prolia -given today Check vitamin D  level

## 2024-02-01 NOTE — Addendum Note (Signed)
 Addended by: CLAUDENE TOBIAS PARAS on: 02/01/2024 04:51 PM   Modules accepted: Orders

## 2024-02-01 NOTE — Addendum Note (Signed)
 Addended by: CLAUDENE TOBIAS PARAS on: 02/01/2024 04:52 PM   Modules accepted: Orders

## 2024-02-01 NOTE — Assessment & Plan Note (Addendum)
 Chronic Blood pressure is well-controlled - on low side No lightheadedness Monitor BP at home Continue amlodipine  2.5 mg daily CBC, CMP

## 2024-02-01 NOTE — Assessment & Plan Note (Signed)
 Chronic Controlled Continue Effexor  to 225 mg daily No longer seeing the therapist

## 2024-02-01 NOTE — Assessment & Plan Note (Signed)
 Chronic Paroxysmal Following with cardiology On Eliquis 5 mg twice daily Rate controlled CMP, CBC

## 2024-02-02 ENCOUNTER — Other Ambulatory Visit: Payer: Self-pay

## 2024-02-02 ENCOUNTER — Emergency Department (HOSPITAL_BASED_OUTPATIENT_CLINIC_OR_DEPARTMENT_OTHER)

## 2024-02-02 ENCOUNTER — Emergency Department (HOSPITAL_BASED_OUTPATIENT_CLINIC_OR_DEPARTMENT_OTHER): Admission: EM | Admit: 2024-02-02 | Discharge: 2024-02-02 | Disposition: A | Discharge status: Home / Self Care

## 2024-02-02 ENCOUNTER — Encounter (HOSPITAL_BASED_OUTPATIENT_CLINIC_OR_DEPARTMENT_OTHER): Payer: Self-pay

## 2024-02-02 DIAGNOSIS — Z7901 Long term (current) use of anticoagulants: Secondary | ICD-10-CM | POA: Diagnosis not present

## 2024-02-02 DIAGNOSIS — Y9262 Dock or shipyard as the place of occurrence of the external cause: Secondary | ICD-10-CM | POA: Diagnosis not present

## 2024-02-02 DIAGNOSIS — Y9301 Activity, walking, marching and hiking: Secondary | ICD-10-CM | POA: Diagnosis not present

## 2024-02-02 DIAGNOSIS — S42352A Displaced comminuted fracture of shaft of humerus, left arm, initial encounter for closed fracture: Secondary | ICD-10-CM | POA: Diagnosis not present

## 2024-02-02 DIAGNOSIS — W010XXA Fall on same level from slipping, tripping and stumbling without subsequent striking against object, initial encounter: Secondary | ICD-10-CM | POA: Insufficient documentation

## 2024-02-02 DIAGNOSIS — S42292A Other displaced fracture of upper end of left humerus, initial encounter for closed fracture: Secondary | ICD-10-CM | POA: Insufficient documentation

## 2024-02-02 DIAGNOSIS — M25512 Pain in left shoulder: Secondary | ICD-10-CM | POA: Diagnosis present

## 2024-02-02 DIAGNOSIS — S42202A Unspecified fracture of upper end of left humerus, initial encounter for closed fracture: Secondary | ICD-10-CM | POA: Diagnosis not present

## 2024-02-02 MED ORDER — ACETAMINOPHEN 325 MG PO TABS
650.0000 mg | ORAL_TABLET | Freq: Once | ORAL | Status: AC
  Administered 2024-02-02: 650 mg via ORAL
  Filled 2024-02-02: qty 2

## 2024-02-02 MED ORDER — OXYCODONE-ACETAMINOPHEN 5-325 MG PO TABS: 1.0000 | ORAL_TABLET | Freq: Four times a day (QID) | ORAL | 0 refills | Status: DC | PRN

## 2024-02-02 NOTE — ED Triage Notes (Signed)
 [place]. Pt states she did not hit her head is on blood thinners. Pt c/o left arm pain. Guarding & holding her left arm during triage.

## 2024-02-02 NOTE — ED Provider Notes (Signed)
 [place] [facility] Provider Note   CSN: [id number] Arrival date & time: 02/02/24  9365     Patient presents with: Fall   Julia [name] is a 73 y.o. female.   This is a 72 year old female presenting emergency department with left shoulder pain after mechanical fall yesterday evening around 8:00.  [place] and [place] onto her right arm.  Did not hit her head no LOC.  She is on Eliquis .  Notes pain only to her left shoulder.  No numbness tingling changes in sensation.  Denies headache, vision loss or changes, no unilateral weakness.  Denies chest pain or shortness of breath.  No abdominal pain.  Able to ambulate without difficulty.   Fall       Prior to Admission medications   Medication Sig Start Date End Date Taking? Authorizing Provider  oxyCODONE -acetaminophen  (PERCOCET/ROXICET) 5-325 MG tablet Take 1 tablet by mouth every 6 (six) hours as needed for severe pain (pain score 7-10). 02/02/24  Yes Tena Linebaugh, Caron J, DO  amLODipine  (NORVASC ) 2.5 MG tablet TAKE 1 TABLET(2.5 MG) BY MOUTH DAILY 04/17/23   Geofm Glade PARAS, MD  Ascorbic Acid (VITAMIN C PO) Take 3,200 mg by mouth daily. 1600 mg each    [provider]  Cholecalciferol (VITAMIN D3) 250 MCG (10000 UT) TABS Take 5,000 Units by mouth 2 (two) times daily.     [provider]  Coenzyme Q10 (COQ-10) 100 MG CAPS Take 200 mg by mouth daily.    [provider]  ELIQUIS  5 MG TABS tablet TAKE 1 TABLET(5 MG) BY MOUTH TWICE DAILY 09/07/23   Cooper, Michael, MD  Krill Oil 1000 MG CAPS Take 1,000 mg by mouth daily.    [provider]  levothyroxine  (SYNTHROID ) 25 MCG tablet TAKE 1 TABLET(25 MCG) BY MOUTH DAILY 10/04/23   Burns, Glade PARAS, MD  Misc Natural Products ([employer] GLUCOS-CHONDROIT-MSM TS) TABS Take 2 tablets by mouth at bedtime.    [provider]  Multiple Vitamins-Minerals (PRESERVISION AREDS 2+MULTI VIT PO)  01/26/24   [provider]  riTUXimab  (RITUXAN ) 500 MG/50ML injection Inject 10 mg into the vein every 6 (six) months.    [provider]  triamcinolone  ointment (KENALOG ) 0.5 % Apply topically 2 (two) times daily. APPLY TOPICALLY TO THE AFFECTED AREA TWICE DAILY 07/27/23   Geofm Glade PARAS, MD  valACYclovir  (VALTREX ) 500 MG tablet TAKE 1 TABLET(500 MG) BY MOUTH DAILY 03/23/23   Geofm Glade PARAS, MD  venlafaxine  XR (EFFEXOR -XR) 150 MG 24 hr capsule TAKE 1 CAPSULE(150 MG) BY MOUTH DAILY WITH BREAKFAST 05/19/23   Burns, Glade PARAS, MD  venlafaxine  XR (EFFEXOR -XR) 75 MG 24 hr capsule TAKE 1 CAPSULE(75 MG) BY MOUTH DAILY WITH BREAKFAST 10/23/23   Geofm Glade PARAS, MD    Allergies: Patient has no known allergies.    Review of Systems  Updated Vital Signs BP 139/72 (BP Location: Left Arm)   Pulse 81   Temp 98.2 F (36.8 C) (Oral)   Resp 18   Ht 5' 3 (1.6 m)   Wt 56 kg   SpO2 100%   BMI 21.87 kg/m   Physical Exam Vitals and nursing note reviewed.  Constitutional:      General: She is not in acute distress. HENT:     Head: Normocephalic and atraumatic.     Nose: Nose normal.     Mouth/Throat:     Mouth: Mucous membranes are moist.  Eyes:  Extraocular Movements: Extraocular movements intact.     Conjunctiva/sclera: Conjunctivae normal.  Cardiovascular:     Rate and Rhythm: Normal rate and regular rhythm.  Pulmonary:     Effort: Pulmonary effort is normal.     Breath sounds: Normal breath sounds.  Abdominal:     General: Abdomen is flat. There is no distension.     Tenderness: There is no abdominal tenderness. There is no guarding or rebound.  Musculoskeletal:     Cervical back: Normal range of motion. No tenderness.     Comments: Chest wall and pelvis stable, nontender.  No bony tenderness to right upper extremity or bilateral lower extremities.  Equal pulses in all extremities.  Her left shoulder with bruising and obvious deformity to the proximal humerus.  Neurovascularly intact in the  left upper extremity with good motor and sensation.  Skin:    Capillary Refill: Capillary refill takes less than 2 seconds.  Neurological:     Mental Status: She is alert and oriented to person, place, and time.  Psychiatric:        Mood and Affect: Mood normal.        Behavior: Behavior normal.     (all labs ordered are listed, but only abnormal results are displayed) Labs Reviewed - No data to display  EKG: None  Radiology: DG Shoulder Left Result Date: 02/02/2024 CLINICAL DATA:  Fall.  Arm deformity. EXAM: LEFT SHOULDER - 2+ VIEW COMPARISON:  No comparison studies available. FINDINGS: Three views study shows a comminuted angulated fracture of the proximal humerus. There is approximately 1/2 shaft with of displacement of the main distal fracture fragment. IMPRESSION: Comminuted proximal humerus fracture. Electronically Signed   By: Camellia Candle M.D.   On: 02/02/2024 08:02     Procedures   Medications Ordered in the ED  acetaminophen  (TYLENOL ) tablet 650 mg (650 mg Oral Given 02/02/24 0757)    Clinical Course as of 02/02/24 0851  Fri Feb 02, 2024  0806 DG Shoulder Left MPRESSION: Comminuted proximal humerus fracture.   Electronically Signed   By: Camellia Candle M.D.   On: 02/02/2024 08:02   [TY]  9175 Case discussed with on-call orthopedist, recommending sling and outpatient follow-up in a week.  No need for quantitation splint. [TY]    Clinical Course User Index [TY] Neysa Caron PARAS, DO                                 Medical Decision Making This is a well-appearing 89-year-old female presenting emergency department after mechanical fall yesterday evening.  She is afebrile vital signs reassuring.  She is on Eliquis  per chart review and patient report, last dose yesterday morning.  Denies hitting her head, has no headache or complaints.  No obvious signs of trauma and is neurovascularly intact.  Low suspicion for intracranial trauma/bleed.  Will forego CT head at this  time.  Exam concerning for humerus fracture versus shoulder dislocation.  X-ray independently reviewed and does show a closed left transverse proximal humerus fracture.  Case discussed with Ortho; see ED course.  Placed in sling for Ortho recommendations.  Discharged in stable condition.  Amount and/or Complexity of Data Reviewed Labs:     Details: Considered labs, however mechanical fall and normal labs with isolated Ortho injury.  Low suspicion for acute intrathoracic or intra-abdominal trauma.  labs would not likely change management or disposition at this point. Radiology: ordered and independent  interpretation performed. Decision-making details documented in ED Course.  Risk OTC drugs. Prescription drug management.      Final diagnoses:  Other closed displaced fracture of proximal end of left humerus, initial encounter    ED Discharge Orders          Ordered    oxyCODONE -acetaminophen  (PERCOCET/ROXICET) 5-325 MG tablet  Every 6 hours PRN        02/02/24 0839               Neysa Caron PARAS, DO 02/02/24 [phone]

## 2024-02-02 NOTE — Discharge Instructions (Addendum)
 You may take Tylenol  every 6-8 hours for baseline pain control.  You may take the Percocet for breakthrough pain.  Please remain in the sling until you are seen by the orthopedic doctor.  Please call to schedule an appointment to be seen in roughly 1 week.  Return if you develop sudden onset headache, vision loss, facial droop, chest pain, shortness of breath, severe pain or any new or worsening symptoms that are concerning to you.

## 2024-02-06 DIAGNOSIS — S42202A Unspecified fracture of upper end of left humerus, initial encounter for closed fracture: Secondary | ICD-10-CM | POA: Diagnosis not present

## 2024-02-07 NOTE — H&P (Signed)
 Patient's anticipated LOS is less than 2 midnights, meeting these requirements: - Younger than 45 - Lives within 1 hour of care - Has a competent adult at home to recover with post-op recover - NO history of  - Chronic pain requiring opiods  - Diabetes  - Coronary Artery Disease  - Heart failure  - Heart attack  - Stroke  - DVT/VTE  - Cardiac arrhythmia  - Respiratory Failure/COPD  - Renal failure  - Anemia  - Advanced Liver disease     Julia [name] is an 73 y.o. female.    Chief Complaint: left shoulder pain  HPI: Pt is a 73 y.o. female complaining of left shoulder pain after recent fall. Pain had continually increased since the beginning. X-rays in the clinic show proximal humerus fracture. Pt has tried various conservative treatments which have failed to alleviate their symptoms. Various options are discussed with the patient. Risks, benefits and expectations were discussed with the patient. Patient understand the risks, benefits and expectations and wishes to proceed with surgery.   PCP:  Geofm Glade PARAS, MD  D/C Plans: Home  PMH: Past Medical History:  Diagnosis Date   Adjustment disorder with mixed anxiety and depressed mood    Age-related osteoporosis without current pathological fracture 01/14/2017   07/2018 - spine  -2.3,  RFN  -1.9,  LFN  -2.3   - improvement in hips  dexa 07/25/17 - spine  -3.1,   RFN  -2.2,  LFN  -2.4  - started on prolia   DEXA 05/13/2010 in [place] , osteopenia   Spine: -2.3   LFN  -1.7   RFN:  -1.8      FRAX  15%, 1.9%        Aortic atherosclerosis 01/20/2022   Noted on abdominal CT 12/2021     Arthritis    Body mass index (BMI) 31.0-31.9, adult    Confusional arousals 09/06/2022   Constipation 12/22/2021   Elevated LFTs 07/15/2022   Fatigue    GERD (gastroesophageal reflux disease) 01/13/2017   Hemangioma 04/18/2017   History of breast augmentation    History of colon polyps 01/14/2017   Last colonoscopy 2018 - no polyps - advised 5 years    History of gastric bypass    History of pelvis fracture    in 30's   History of tuberculosis exposure 01/14/2017   Hx of latent TB, treated for 9 months   Hyperglycemia 09/28/2020   Hypertension    Hyponatremia 07/15/2022   Hypothyroidism 01/14/2017   Laceration of right hand without foreign body 07/24/2023   Melanocytic nevi, unspecified 04/18/2017   Menopause 12/25/2019   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   Osteoporosis 01/14/2017   07/2018 - spine  -2.3,  RFN  -1.9,  LFN  -2.3   - improvement in hips dexa 07/25/17 - spine  -3.1,   RFN  -2.2,  LFN  -2.4  - started on prolia  DEXA 05/13/2010 in [place] , osteopenia   Spine: -2.3   LFN  -1.7   RFN:  -1.8     FRAX  15%, 1.9%    Paresthesia 07/17/2018   Paroxysmal atrial fibrillation 09/29/2020   PFO with atrial septal aneurysm 06/19/2020   repaired in 05/27/2020   Plant dermatitis 01/08/2020   PND (post-nasal drip) 11/08/2022   Pneumonia    Positive QuantiFERON-TB Gold test    Purpura 04/18/2017   Rheumatoid arthritis 01/13/2017   Following at [facility] rheumatology  Has been on Prednisone , Plaquenil, Arava, Humira, etolodac, Enbrel, Orencia, Remicade and  Rituxan      Sebaceous cyst 04/18/2017   Spells of decreased attentiveness 09/06/2022   Steatorrhea 11/08/2022   Subjective memory complaints 07/24/2023   TIA (transient ischemic attack) 04/22/2020   Ventral hernia without obstruction or gangrene 11/01/2019   Noted on Ct scan 10/2019   Vitamin D  deficiency 06/22/2017    PSH: Past Surgical History:  Procedure Laterality Date   APPENDECTOMY     AUGMENTATION MAMMAPLASTY     breast enhancement removal Bilateral 03/2022   BREAST ENHANCEMENT SURGERY Bilateral    BUBBLE STUDY  05/27/2020   Procedure: BUBBLE STUDY;  Surgeon: Barbaraann Darryle Ned, MD;  Location: [facility] ENDOSCOPY;  Service: Cardiovascular;;   GASTRIC BYPASS     PATENT FORAMEN OVALE(PFO) CLOSURE N/A 06/19/2020   Procedure: PATENT FORAMEN OVALE (PFO) CLOSURE;  Surgeon:  Wonda Sharper, MD;  Location: [facility] INVASIVE CV LAB;  Service: Cardiovascular;  Laterality: N/A;   TEE WITHOUT CARDIOVERSION N/A 05/27/2020   Procedure: TRANSESOPHAGEAL ECHOCARDIOGRAM (TEE);  Surgeon: Barbaraann Darryle Ned, MD;  Location: [facility] ENDOSCOPY;  Service: Cardiovascular;  Laterality: N/A;   TONSILLECTOMY      Social History:  reports that she has never smoked. She has never used smokeless tobacco. She reports current alcohol use of about 7.0 - 14.0 standard drinks of alcohol per week. She reports that she does not use drugs. BMI: Estimated body mass index is 21.87 kg/m as calculated from the following:   Height as of 02/02/24: 5' 3 (1.6 m).   Weight as of 02/02/24: 56 kg.  Lab Results  Component Value Date   ALBUMIN 4.7 02/01/2024   Diabetes: Patient does not have a diagnosis of diabetes.     Smoking Status:   reports that she has never smoked. She has never used smokeless tobacco.    Allergies:  No Known Allergies  Medications: Current Facility-Administered Medications  Medication Dose Route Frequency Provider Last Rate Last Admin   denosumab  (PROLIA ) injection 60 mg  60 mg Subcutaneous Once Geofm Glade PARAS, MD       [START ON 07/15/2024] denosumab  (PROLIA ) injection 60 mg  60 mg Subcutaneous Once Geofm Glade PARAS, MD       Current Outpatient Medications  Medication Sig Dispense Refill   amLODipine  (NORVASC ) 2.5 MG tablet TAKE 1 TABLET(2.5 MG) BY MOUTH DAILY 90 tablet 3   Ascorbic Acid (VITAMIN C PO) Take 3,200 mg by mouth daily. 1600 mg each     Cholecalciferol (VITAMIN D3) 250 MCG (10000 UT) TABS Take 5,000 Units by mouth 2 (two) times daily.      Coenzyme Q10 (COQ-10) 100 MG CAPS Take 200 mg by mouth daily.     ELIQUIS  5 MG TABS tablet TAKE 1 TABLET(5 MG) BY MOUTH TWICE DAILY 180 tablet 1   Krill Oil 1000 MG CAPS Take 1,000 mg by mouth daily.     levothyroxine  (SYNTHROID ) 25 MCG tablet TAKE 1 TABLET(25 MCG) BY MOUTH DAILY 90 tablet 3   Misc Natural Products ([employer]  GLUCOS-CHONDROIT-MSM TS) TABS Take 2 tablets by mouth at bedtime.     Multiple Vitamins-Minerals (PRESERVISION AREDS 2+MULTI VIT PO)      oxyCODONE -acetaminophen  (PERCOCET/ROXICET) 5-325 MG tablet Take 1 tablet by mouth every 6 (six) hours as needed for severe pain (pain score 7-10). 15 tablet 0   riTUXimab  (RITUXAN ) 500 MG/50ML injection Inject 10 mg into the vein every 6 (six) months.     triamcinolone  ointment (KENALOG ) 0.5 % Apply topically 2 (two) times daily. APPLY TOPICALLY TO THE AFFECTED AREA TWICE  DAILY 30 g 2   valACYclovir  (VALTREX ) 500 MG tablet TAKE 1 TABLET(500 MG) BY MOUTH DAILY 90 tablet 1   venlafaxine  XR (EFFEXOR -XR) 150 MG 24 hr capsule TAKE 1 CAPSULE(150 MG) BY MOUTH DAILY WITH BREAKFAST 90 capsule 3   venlafaxine  XR (EFFEXOR -XR) 75 MG 24 hr capsule TAKE 1 CAPSULE(75 MG) BY MOUTH DAILY WITH BREAKFAST 90 capsule 1   Facility-Administered Medications Ordered in Other Encounters  Medication Dose Route Frequency Provider Last Rate Last Admin   sodium chloride  flush (NS) 0.9 % injection 10 mL  10 mL Intravenous PRN Sebastian Collar R, PA-C   20 mL at 07/16/21 1025    No results found for this or any previous visit (from the past 48 hours). No results found.  ROS: Pain with rom of the left upper extremity  Physical Exam: Alert and oriented 73 y.o. female in no acute distress Cranial nerves 2-12 intact Cervical spine: full rom with no tenderness, nv intact distally Chest: active breath sounds bilaterally, no wheeze rhonchi or rales Heart: regular rate and rhythm, no murmur Abd: non tender non distended with active bowel sounds Hip is stable with rom  Left shoulder with limited rom Nv intact distally No signs of open injury  Assessment/Plan Assessment: left proximal humerus fracture  Plan:  Patient will undergo a left proximal humerus ORIF by Dr. Kay at [facility] Risks benefits and expectations were discussed with the patient. Patient understand risks, benefits and  expectations and wishes to proceed. Preoperative templating of the joint replacement has been completed, documented, and submitted to the Operating Room personnel in order to optimize intra-operative equipment management.   Arvella Fireman PA-C, MPAS [facility] is now [employer] [address], [address], [place], [place] [address] Phone: [phone] [email or web address] Facebook  [employer]

## 2024-02-08 ENCOUNTER — Encounter (HOSPITAL_COMMUNITY): Payer: Self-pay

## 2024-02-08 NOTE — Progress Notes (Signed)
 Surgical Instructions   Your procedure is scheduled on Saturday, 02/10/24. Report to [facility] Emergency Entrance A (then take first left turn) at 6 A.M., check in at the Emergency Department.  Any questions or running late day of surgery: call [phone]  Questions prior to your surgery date: call [phone], Monday-Friday, 8am-4pm. If you experience any cold or flu symptoms such as cough, fever, chills, shortness of breath, etc. between now and your scheduled surgery, please notify us  at the above number.     Remember:  Do not eat after midnight the night before your surgery-Friday   You may drink clear liquids until 4:30 AM,  the morning of your surgery-Saturday.   Clear liquids allowed are: Water, Non-Citrus Juices (without pulp), Carbonated Beverages, Clear Tea (no milk, honey, etc.), Black Coffee Only (NO MILK, CREAM OR POWDERED CREAMER of any kind), and Gatorade.    Take these medicines the morning of surgery with A SIP OF WATER: amLODipine  (NORVASC )  levothyroxine  (SYNTHROID )  valACYclovir  (VALTREX )  venlafaxine  XR (EFFEXOR -XR   May take these medicines IF NEEDED: oxyCODONE -acetaminophen     Follow your surgeon's instructions when to stop Eliquis  prior to surgery.  If no instructions were given by your surgeon then you will need to call the office for those instructions.  Last dose was on 7/  /25.   One week prior to surgery, STOP taking any Aspirin  (unless otherwise instructed by your surgeon) Aleve, Naproxen, Ibuprofen, Motrin, Advil, Goody's, BC's, all herbal medications, fish oil, and non-prescription vitamins.            You will be asked to remove any contacts, glasses, piercing's, hearing aid's, dentures/partials prior to surgery. Please bring cases for these items if needed.    Patients discharged the day of surgery will not be allowed to drive home, and someone needs to stay with them for 24 hours.  SURGICAL WAITING ROOM VISITATION Patients may have no more  than 2 support people in the waiting area - these visitors may rotate.   Pre-op nurse will coordinate an appropriate time for 1 ADULT support person, who may not rotate, to accompany patient in pre-op.  Children under the age of 78 must have an adult with them who is not the patient and must remain in the main waiting area with an adult.  If the patient needs to stay at the hospital during part of their recovery, the visitor guidelines for inpatient rooms apply.  Please refer to the [facility] website for the visitor guidelines for any additional information.   If you received a COVID test during your pre-op visit  it is requested that you wear a mask when out in public, stay away from anyone that may not be feeling well and notify your surgeon if you develop symptoms. If you have been in contact with anyone that has tested positive in the last 10 days please notify you surgeon.      Pre-operative CHG Bathing Instructions   You can play a key role in reducing the risk of infection after surgery. Your skin needs to be as free of germs as possible. You can reduce the number of germs on your skin by washing with CHG (chlorhexidine gluconate) soap before surgery. CHG is an antiseptic soap that kills germs and continues to kill germs even after washing.   DO NOT use if you have an allergy to chlorhexidine/CHG or antibacterial soaps. If your skin becomes reddened or irritated, stop using the CHG and notify one of our  RNs at [phone].              TAKE A SHOWER THE NIGHT BEFORE SURGERY AND THE DAY OF SURGERY    Please keep in mind the following:  DO NOT shave, including legs and underarms, 48 hours prior to surgery.   You may shave your face before/day of surgery.  Place clean sheets on your bed the night before surgery Use a clean washcloth (not used since being washed) for each shower. DO NOT sleep with pet's night before surgery.  CHG Shower Instructions:  Wash your face and private  area with normal soap. If you choose to wash your hair, wash first with your normal shampoo.  After you use shampoo/soap, rinse your hair and body thoroughly to remove shampoo/soap residue.  Turn the water OFF and apply half the bottle of CHG soap to a CLEAN washcloth.  Apply CHG soap ONLY FROM YOUR NECK DOWN TO YOUR TOES (washing for 3-5 minutes)  DO NOT use CHG soap on face, private areas, open wounds, or sores.  Pay special attention to the area where your surgery is being performed.  If you are having back surgery, having someone wash your back for you may be helpful. Wait 2 minutes after CHG soap is applied, then you may rinse off the CHG soap.  Pat dry with a clean towel  Put on clean pajamas    Additional instructions for the day of surgery: DO NOT APPLY any lotions, deodorants, cologne, or perfumes.   Do not wear jewelry or makeup Do not wear nail polish, gel polish, artificial nails, or any other type of covering on natural nails (fingers and toes) Do not bring valuables to the hospital. [facility] is not responsible for valuables/personal belongings. Put on clean/comfortable clothes.  Please brush your teeth.  Ask your nurse before applying any prescription medications to the skin.

## 2024-02-08 NOTE — Progress Notes (Signed)
 PCP - Dr Glade Hope Cardiologist - Dr Ozell Fell (last OV 03/10/23) Gastro - Dr Elspeth [facility] Neurology - Dr Arthea [place] Rheumatology - Dr Sherwood Deitra Duncans  CT Chest x-ray - 01/30/23 EKG - 07/20/23 Stress Test - 07/27/18 ECHO Bubble LTD - 07/16/21 Cardiac Cath - 06/19/20  ICD Pacemaker/Loop - n/a  Sleep Study -  yes (11/2022) CPAP - does not need CPAP, normal results  Diabetes - n/a  Blood Thinner Instructions:  Per MD's office, hold Eliquis  2-3 days prior to procedure.    Last dose was on ??  02/06/24.  Aspirin  Instructions: n/a  ERAS -clear liquids til 4:30 AM DOS PRE-SURGERY Ensure given with instructions for DOS.   Anesthesia review: Yes  STOP now taking any Aspirin  (unless otherwise instructed by your surgeon), Aleve, Naproxen, Ibuprofen, Motrin, Advil, Goody's, BC's, all herbal medications, fish oil, and all vitamins.   Coronavirus Screening Do you have any of the following symptoms:  Cough yes/no: No Fever (>100.32F)  yes/no: No Runny nose yes/no: No Sore throat yes/no: No Difficulty breathing/shortness of breath  yes/no: No  Have you traveled in the last 14 days and where? yes/no: No  Patient verbalized understanding of instructions that were given to them at the PAT appointment. Patient was also instructed that they will need to review over the PAT instructions again at home before surgery.

## 2024-02-09 ENCOUNTER — Other Ambulatory Visit: Payer: Self-pay

## 2024-02-09 ENCOUNTER — Encounter (HOSPITAL_COMMUNITY)
Admission: RE | Admit: 2024-02-09 | Discharge: 2024-02-09 | Disposition: A | Discharge status: Home / Self Care | Source: Ambulatory Visit | Attending: Orthopedic Surgery | Admitting: Orthopedic Surgery

## 2024-02-09 ENCOUNTER — Encounter (HOSPITAL_COMMUNITY): Payer: Self-pay

## 2024-02-09 ENCOUNTER — Encounter (HOSPITAL_COMMUNITY): Admission: RE | Admit: 2024-02-09 | Source: Ambulatory Visit

## 2024-02-09 VITALS — BP 135/78 | HR 73 | Temp 98.2°F | Resp 18 | Ht 63.0 in | Wt 125.0 lb

## 2024-02-09 DIAGNOSIS — Z01812 Encounter for preprocedural laboratory examination: Secondary | ICD-10-CM | POA: Diagnosis not present

## 2024-02-09 DIAGNOSIS — Z01818 Encounter for other preprocedural examination: Secondary | ICD-10-CM | POA: Diagnosis present

## 2024-02-09 HISTORY — DX: Repeated falls: R29.6

## 2024-02-09 LAB — SURGICAL PCR SCREEN
MRSA, PCR: NEGATIVE
Staphylococcus aureus: NEGATIVE

## 2024-02-09 NOTE — Anesthesia Preprocedure Evaluation (Addendum)
 Anesthesia Evaluation  Patient identified by MRN, date of birth, ID band Patient awake    Reviewed: Allergy & Precautions, NPO status , Patient's Chart, lab work & pertinent test results  History of Anesthesia Complications Negative for: history of anesthetic complications  Airway Mallampati: II  TM Distance: >3 FB Neck ROM: Full    Dental  (+) Dental Advisory Given, Teeth Intact   Pulmonary neg pulmonary ROS   breath sounds clear to auscultation       Cardiovascular hypertension, Pt. on medications (-) angina + dysrhythmias Atrial Fibrillation + Valvular Problems/Murmurs (s/p PFO repair 2021)  Rhythm:Regular Rate:Normal  '23 ECHO: EF 60 to 65%. 1. LV EF  62 %. The left ventricle has normal function, no regional wall motion  abnormalities. Grade I diastolic dysfunction (impaired relaxation). The average left ventricular global longitudinal strain is -26.2 %. The global longitudinal strain is normal.   3. RVF is normal. The right ventricular size is normal.   4. The mitral valve is normal in structure. Trivial mitral valve regurgitation.   5. The inferior vena cava is normal in size with greater than 50% respiratory variability, suggesting right atrial pressure of 3 mmHg.   6. Agitated saline contrast bubble study was negative, with no evidence  of any interatrial shunt.     Neuro/Psych TIA negative psych ROS   GI/Hepatic Neg liver ROS,GERD  Controlled,,H/o gastric bypass   Endo/Other  Hypothyroidism    Renal/GU negative Renal ROS     Musculoskeletal   Abdominal   Peds  Hematology Eliquis : 6d ago Hb 13.4, plt 338k   Anesthesia Other Findings   Reproductive/Obstetrics                              Anesthesia Physical Anesthesia Plan  ASA: 3  Anesthesia Plan: General   Post-op Pain Management: Regional block* and Tylenol  PO (pre-op)*   Induction: Intravenous  PONV Risk Score and  Plan: 3 and Ondansetron , Dexamethasone  and Treatment may vary due to age or medical condition  Airway Management Planned: Oral ETT  Additional Equipment: None  Intra-op Plan:   Post-operative Plan: Extubation in OR  Informed Consent: I have reviewed the patients History and Physical, chart, labs and discussed the procedure including the risks, benefits and alternatives for the proposed anesthesia with the patient or authorized representative who has indicated his/her understanding and acceptance.     Dental advisory given  Plan Discussed with: CRNA and Surgeon  Anesthesia Plan Comments: (Plan routine monitors, GETA with interscalene block for post op analgesia)         Anesthesia Quick Evaluation

## 2024-02-10 ENCOUNTER — Ambulatory Visit (HOSPITAL_COMMUNITY): Payer: Self-pay | Admitting: Anesthesiology

## 2024-02-10 ENCOUNTER — Ambulatory Visit (HOSPITAL_COMMUNITY)

## 2024-02-10 ENCOUNTER — Ambulatory Visit (HOSPITAL_COMMUNITY)
Admission: RE | Admit: 2024-02-10 | Discharge: 2024-02-10 | Disposition: A | Discharge status: Home / Self Care | Attending: Orthopedic Surgery | Admitting: Orthopedic Surgery

## 2024-02-10 ENCOUNTER — Encounter (HOSPITAL_COMMUNITY): Payer: Self-pay | Admitting: Orthopedic Surgery

## 2024-02-10 ENCOUNTER — Other Ambulatory Visit: Payer: Self-pay

## 2024-02-10 ENCOUNTER — Encounter (HOSPITAL_COMMUNITY)
Admission: RE | Disposition: A | Discharge status: Home / Self Care | Payer: Self-pay | Source: Home / Self Care | Attending: Orthopedic Surgery

## 2024-02-10 DIAGNOSIS — I4891 Unspecified atrial fibrillation: Secondary | ICD-10-CM | POA: Diagnosis not present

## 2024-02-10 DIAGNOSIS — Z8673 Personal history of transient ischemic attack (TIA), and cerebral infarction without residual deficits: Secondary | ICD-10-CM | POA: Insufficient documentation

## 2024-02-10 DIAGNOSIS — E039 Hypothyroidism, unspecified: Secondary | ICD-10-CM | POA: Diagnosis not present

## 2024-02-10 DIAGNOSIS — W19XXXA Unspecified fall, initial encounter: Secondary | ICD-10-CM | POA: Insufficient documentation

## 2024-02-10 DIAGNOSIS — S42212A Unspecified displaced fracture of surgical neck of left humerus, initial encounter for closed fracture: Secondary | ICD-10-CM | POA: Insufficient documentation

## 2024-02-10 DIAGNOSIS — S42202D Unspecified fracture of upper end of left humerus, subsequent encounter for fracture with routine healing: Secondary | ICD-10-CM | POA: Diagnosis not present

## 2024-02-10 DIAGNOSIS — Z7901 Long term (current) use of anticoagulants: Secondary | ICD-10-CM | POA: Insufficient documentation

## 2024-02-10 DIAGNOSIS — I10 Essential (primary) hypertension: Secondary | ICD-10-CM | POA: Diagnosis not present

## 2024-02-10 DIAGNOSIS — Z9884 Bariatric surgery status: Secondary | ICD-10-CM | POA: Diagnosis not present

## 2024-02-10 DIAGNOSIS — K219 Gastro-esophageal reflux disease without esophagitis: Secondary | ICD-10-CM | POA: Insufficient documentation

## 2024-02-10 DIAGNOSIS — G8918 Other acute postprocedural pain: Secondary | ICD-10-CM | POA: Diagnosis not present

## 2024-02-10 DIAGNOSIS — S42202A Unspecified fracture of upper end of left humerus, initial encounter for closed fracture: Secondary | ICD-10-CM | POA: Diagnosis not present

## 2024-02-10 HISTORY — PX: ORIF HUMERUS FRACTURE: SHX2126

## 2024-02-10 SURGERY — OPEN REDUCTION INTERNAL FIXATION (ORIF) PROXIMAL HUMERUS FRACTURE
Anesthesia: General | Laterality: Left

## 2024-02-10 MED ORDER — CHLORHEXIDINE GLUCONATE 0.12 % MT SOLN
OROMUCOSAL | Status: AC
  Filled 2024-02-10: qty 15

## 2024-02-10 MED ORDER — MIDAZOLAM HCL 2 MG/2ML IJ SOLN
1.0000 mg | Freq: Once | INTRAMUSCULAR | Status: AC
  Administered 2024-02-10: 1 mg via INTRAVENOUS

## 2024-02-10 MED ORDER — 0.9 % SODIUM CHLORIDE (POUR BTL) OPTIME
TOPICAL | Status: DC | PRN
  Administered 2024-02-10: 1000 mL

## 2024-02-10 MED ORDER — ROCURONIUM BROMIDE 10 MG/ML (PF) SYRINGE
PREFILLED_SYRINGE | INTRAVENOUS | Status: AC
Start: 2024-02-10 — End: 2024-02-10
  Filled 2024-02-10: qty 10

## 2024-02-10 MED ORDER — PROPOFOL 10 MG/ML IV BOLUS
INTRAVENOUS | Status: AC
  Filled 2024-02-10: qty 20

## 2024-02-10 MED ORDER — FENTANYL CITRATE (PF) 100 MCG/2ML IJ SOLN
50.0000 ug | Freq: Once | INTRAMUSCULAR | Status: AC
  Administered 2024-02-10: 50 ug via INTRAVENOUS

## 2024-02-10 MED ORDER — ACETAMINOPHEN 500 MG PO TABS
1000.0000 mg | ORAL_TABLET | Freq: Once | ORAL | Status: AC
  Administered 2024-02-10: 1000 mg via ORAL

## 2024-02-10 MED ORDER — BUPIVACAINE LIPOSOME 1.3 % IJ SUSP
INTRAMUSCULAR | Status: DC | PRN
  Administered 2024-02-10: 10 mL via PERINEURAL

## 2024-02-10 MED ORDER — CEFAZOLIN SODIUM-DEXTROSE 2-4 GM/100ML-% IV SOLN
INTRAVENOUS | Status: AC
Start: 2024-02-10 — End: 2024-02-10
  Filled 2024-02-10: qty 100

## 2024-02-10 MED ORDER — CHLORHEXIDINE GLUCONATE 0.12 % MT SOLN
15.0000 mL | Freq: Once | OROMUCOSAL | Status: AC
  Administered 2024-02-10: 15 mL via OROMUCOSAL

## 2024-02-10 MED ORDER — BUPIVACAINE-EPINEPHRINE (PF) 0.5% -1:200000 IJ SOLN
INTRAMUSCULAR | Status: DC | PRN
Start: 2024-02-10 — End: 2024-02-10
  Administered 2024-02-10: 10 mL via PERINEURAL

## 2024-02-10 MED ORDER — EPHEDRINE SULFATE-NACL 50-0.9 MG/10ML-% IV SOSY
PREFILLED_SYRINGE | INTRAVENOUS | Status: DC | PRN
  Administered 2024-02-10: 5 mg via INTRAVENOUS
  Administered 2024-02-10: 10 mg via INTRAVENOUS
  Administered 2024-02-10: 5 mg via INTRAVENOUS

## 2024-02-10 MED ORDER — ACETAMINOPHEN 500 MG PO TABS
ORAL_TABLET | ORAL | Status: AC
  Filled 2024-02-10: qty 2

## 2024-02-10 MED ORDER — EPHEDRINE 5 MG/ML INJ
INTRAVENOUS | Status: AC
Start: 2024-02-10 — End: 2024-02-10
  Filled 2024-02-10: qty 5

## 2024-02-10 MED ORDER — LACTATED RINGERS IV SOLN: INTRAVENOUS | Status: DC

## 2024-02-10 MED ORDER — OXYCODONE HCL 5 MG/5ML PO SOLN: 5.0000 mg | Freq: Once | ORAL | Status: DC | PRN

## 2024-02-10 MED ORDER — VANCOMYCIN HCL 1000 MG IV SOLR
INTRAVENOUS | Status: AC
Start: 2024-02-10 — End: 2024-02-10
  Filled 2024-02-10: qty 20

## 2024-02-10 MED ORDER — BUPIVACAINE-EPINEPHRINE (PF) 0.25% -1:200000 IJ SOLN
INTRAMUSCULAR | Status: DC | PRN
  Administered 2024-02-10: 10 mL

## 2024-02-10 MED ORDER — LIDOCAINE 2% (20 MG/ML) 5 ML SYRINGE
INTRAMUSCULAR | Status: AC
Start: 2024-02-10 — End: 2024-02-10
  Filled 2024-02-10: qty 5

## 2024-02-10 MED ORDER — DEXAMETHASONE SODIUM PHOSPHATE 10 MG/ML IJ SOLN
INTRAMUSCULAR | Status: AC
  Filled 2024-02-10: qty 1

## 2024-02-10 MED ORDER — PHENYLEPHRINE HCL-NACL 20-0.9 MG/250ML-% IV SOLN
INTRAVENOUS | Status: DC | PRN
Start: 2024-02-10 — End: 2024-02-10
  Administered 2024-02-10: 25 ug/min via INTRAVENOUS

## 2024-02-10 MED ORDER — SUGAMMADEX SODIUM 200 MG/2ML IV SOLN
INTRAVENOUS | Status: DC | PRN
  Administered 2024-02-10: 125 mg via INTRAVENOUS

## 2024-02-10 MED ORDER — FENTANYL CITRATE (PF) 250 MCG/5ML IJ SOLN
INTRAMUSCULAR | Status: AC
  Filled 2024-02-10: qty 5

## 2024-02-10 MED ORDER — MIDAZOLAM HCL 2 MG/2ML IJ SOLN: 0.5000 mg | Freq: Once | INTRAMUSCULAR | Status: DC | PRN

## 2024-02-10 MED ORDER — MEPERIDINE HCL 25 MG/ML IJ SOLN: 6.2500 mg | INTRAMUSCULAR | Status: DC | PRN

## 2024-02-10 MED ORDER — ROCURONIUM BROMIDE 10 MG/ML (PF) SYRINGE
PREFILLED_SYRINGE | INTRAVENOUS | Status: DC | PRN
  Administered 2024-02-10: 50 mg via INTRAVENOUS
  Administered 2024-02-10: 20 mg via INTRAVENOUS

## 2024-02-10 MED ORDER — VANCOMYCIN HCL 1000 MG IV SOLR
INTRAVENOUS | Status: DC | PRN
Start: 2024-02-10 — End: 2024-02-10
  Administered 2024-02-10: 1000 mg via TOPICAL

## 2024-02-10 MED ORDER — LIDOCAINE 2% (20 MG/ML) 5 ML SYRINGE
INTRAMUSCULAR | Status: DC | PRN
  Administered 2024-02-10: 20 mg via INTRAVENOUS

## 2024-02-10 MED ORDER — HYDROMORPHONE HCL 1 MG/ML IJ SOLN: 0.2500 mg | INTRAMUSCULAR | Status: DC | PRN

## 2024-02-10 MED ORDER — MIDAZOLAM HCL 2 MG/2ML IJ SOLN
INTRAMUSCULAR | Status: AC
  Filled 2024-02-10: qty 2

## 2024-02-10 MED ORDER — FENTANYL CITRATE (PF) 250 MCG/5ML IJ SOLN
INTRAMUSCULAR | Status: DC | PRN
  Administered 2024-02-10: 25 ug via INTRAVENOUS
  Administered 2024-02-10 (×2): 50 ug via INTRAVENOUS

## 2024-02-10 MED ORDER — PROPOFOL 10 MG/ML IV BOLUS
INTRAVENOUS | Status: DC | PRN
  Administered 2024-02-10: 90 mg via INTRAVENOUS

## 2024-02-10 MED ORDER — CEFAZOLIN SODIUM-DEXTROSE 2-4 GM/100ML-% IV SOLN
2.0000 g | INTRAVENOUS | Status: AC
  Administered 2024-02-10: 2 g via INTRAVENOUS

## 2024-02-10 MED ORDER — ONDANSETRON HCL 4 MG/2ML IJ SOLN
INTRAMUSCULAR | Status: AC
  Filled 2024-02-10: qty 2

## 2024-02-10 MED ORDER — TRANEXAMIC ACID-NACL 1000-0.7 MG/100ML-% IV SOLN
INTRAVENOUS | Status: AC
  Filled 2024-02-10: qty 100

## 2024-02-10 MED ORDER — DEXAMETHASONE SODIUM PHOSPHATE 10 MG/ML IJ SOLN
INTRAMUSCULAR | Status: DC | PRN
  Administered 2024-02-10: 5 mg via INTRAVENOUS

## 2024-02-10 MED ORDER — MIDAZOLAM HCL 5 MG/5ML IJ SOLN
INTRAMUSCULAR | Status: DC | PRN
  Administered 2024-02-10: 1 mg via INTRAVENOUS

## 2024-02-10 MED ORDER — ONDANSETRON HCL 4 MG/2ML IJ SOLN
INTRAMUSCULAR | Status: DC | PRN
  Administered 2024-02-10: 4 mg via INTRAVENOUS

## 2024-02-10 MED ORDER — ORAL CARE MOUTH RINSE: 15.0000 mL | Freq: Once | OROMUCOSAL | Status: AC

## 2024-02-10 MED ORDER — FENTANYL CITRATE (PF) 100 MCG/2ML IJ SOLN
INTRAMUSCULAR | Status: AC
Start: 2024-02-10 — End: 2024-02-10
  Filled 2024-02-10: qty 2

## 2024-02-10 MED ORDER — BUPIVACAINE-EPINEPHRINE (PF) 0.25% -1:200000 IJ SOLN
INTRAMUSCULAR | Status: AC
  Filled 2024-02-10: qty 30

## 2024-02-10 MED ORDER — MIDAZOLAM HCL 2 MG/2ML IJ SOLN
INTRAMUSCULAR | Status: AC
Start: 2024-02-10 — End: 2024-02-10
  Filled 2024-02-10: qty 2

## 2024-02-10 MED ORDER — OXYCODONE HCL 5 MG PO TABS: 5.0000 mg | ORAL_TABLET | Freq: Once | ORAL | Status: DC | PRN

## 2024-02-10 MED ORDER — TRANEXAMIC ACID-NACL 1000-0.7 MG/100ML-% IV SOLN
1000.0000 mg | INTRAVENOUS | Status: AC
  Administered 2024-02-10: 1000 mg via INTRAVENOUS

## 2024-02-10 SURGICAL SUPPLY — 54 items
BAG COUNTER SPONGE SURGICOUNT (BAG) ×1 IMPLANT
BIT DRILL 3.2XCALB NS DISP (BIT) IMPLANT
BIT DRILL CALIBRATED 2.7 (BIT) IMPLANT
COVER SURGICAL LIGHT HANDLE (MISCELLANEOUS) ×1 IMPLANT
DRAPE C-ARM 42X72 X-RAY (DRAPES) ×1 IMPLANT
DRAPE IMP U-DRAPE 54X76 (DRAPES) ×1 IMPLANT
DRAPE INCISE IOBAN 66X45 STRL (DRAPES) ×1 IMPLANT
DRAPE SURG ORHT 6 SPLT 77X108 (DRAPES) ×2 IMPLANT
DRAPE U-SHAPE 47X51 STRL (DRAPES) ×1 IMPLANT
DRSG ADAPTIC 3X8 NADH LF (GAUZE/BANDAGES/DRESSINGS) IMPLANT
DRSG EMULSION OIL 3X3 NADH (GAUZE/BANDAGES/DRESSINGS) ×1 IMPLANT
DURAPREP 26ML APPLICATOR (WOUND CARE) ×1 IMPLANT
ELECT NDL BLADE 2-5/6 (NEEDLE) ×1 IMPLANT
ELECT NEEDLE BLADE 2-5/6 (NEEDLE) ×1 IMPLANT
ELECTRODE REM PT RTRN 9FT ADLT (ELECTROSURGICAL) ×1 IMPLANT
GAUZE PAD ABD 8X10 STRL (GAUZE/BANDAGES/DRESSINGS) ×1 IMPLANT
GAUZE SPONGE 4X4 12PLY STRL (GAUZE/BANDAGES/DRESSINGS) ×1 IMPLANT
GLOVE ORTHO TXT STRL SZ7.5 (GLOVE) ×1 IMPLANT
GLOVE PI ORTHO PRO STRL 7.5 (GLOVE) ×1 IMPLANT
GLOVE PI ORTHO PRO STRL SZ8 (GLOVE) ×1 IMPLANT
GLOVE SURG ORTHO 8.5 STRL (GLOVE) ×1 IMPLANT
GOWN STRL REUS W/ TWL XL LVL3 (GOWN DISPOSABLE) ×2 IMPLANT
KIT BASIN OR (CUSTOM PROCEDURE TRAY) ×1 IMPLANT
KIT TURNOVER KIT B (KITS) ×1 IMPLANT
KWIRE 2X5 SS THRDED S3 (WIRE) IMPLANT
MANIFOLD NEPTUNE II (INSTRUMENTS) ×1 IMPLANT
NDL HYPO 25GX1X1/2 BEV (NEEDLE) IMPLANT
NEEDLE HYPO 25GX1X1/2 BEV (NEEDLE) IMPLANT
NS IRRIG 1000ML POUR BTL (IV SOLUTION) ×1 IMPLANT
PACK SHOULDER (CUSTOM PROCEDURE TRAY) ×1 IMPLANT
PAD ARMBOARD POSITIONER FOAM (MISCELLANEOUS) ×2 IMPLANT
PEG LOCKING 3.2MMX44 (Peg) IMPLANT
PEG LOCKING 3.2X34 (Screw) IMPLANT
PEG LOCKING 3.2X36 (Screw) IMPLANT
PEG LOCKING 3.2X38 (Screw) IMPLANT
PEG LOCKING 3.2X40 (Peg) IMPLANT
PEG LOCKING 3.2X42 (Screw) IMPLANT
PEG LOCKING 3.2X48 (Peg) IMPLANT
PLATE PROX HUMERUS 4H LEFT LOW (Plate) IMPLANT
SCREW LOCK CORT STAR 3.5X26 (Screw) IMPLANT
SCREW LP NL T15 3.5X24 (Screw) IMPLANT
SCREW LP NL T15 3.5X26 (Screw) IMPLANT
SLEEVE MEASURING 3.2 (BIT) IMPLANT
SPONGE T-LAP 4X18 ~~LOC~~+RFID (SPONGE) ×2 IMPLANT
STRIP CLOSURE SKIN 1/2X4 (GAUZE/BANDAGES/DRESSINGS) ×1 IMPLANT
SUCTION TUBE FRAZIER 10FR DISP (SUCTIONS) ×1 IMPLANT
SUT MNCRL AB 4-0 PS2 18 (SUTURE) ×1 IMPLANT
SUT VIC AB 0 CT1 27XBRD ANBCTR (SUTURE) ×1 IMPLANT
SUT VIC AB 2-0 CT1 TAPERPNT 27 (SUTURE) ×1 IMPLANT
SUTURE FIBERWR #2 38 T-5 BLUE (SUTURE) IMPLANT
SYR CONTROL 10ML LL (SYRINGE) ×1 IMPLANT
TAPE PAPER 3X10 WHT MICROPORE (GAUZE/BANDAGES/DRESSINGS) IMPLANT
TOWEL GREEN STERILE (TOWEL DISPOSABLE) ×1 IMPLANT
TOWEL GREEN STERILE FF (TOWEL DISPOSABLE) ×1 IMPLANT

## 2024-02-10 NOTE — Anesthesia Postprocedure Evaluation (Signed)
 Anesthesia Post Note  Patient: Julia [name]  Procedure(s) Performed: OPEN REDUCTION INTERNAL FIXATION (ORIF) PROXIMAL HUMERUS FRACTURE (Left)     Patient location during evaluation: PACU Anesthesia Type: General Level of consciousness: awake and alert, patient cooperative and oriented Pain management: pain level controlled Vital Signs Assessment: post-procedure vital signs reviewed and stable Respiratory status: spontaneous breathing, nonlabored ventilation and respiratory function stable Cardiovascular status: blood pressure returned to baseline and stable Postop Assessment: no apparent nausea or vomiting and able to ambulate Anesthetic complications: no   No notable events documented.  Last Vitals:  Vitals:   02/10/24 0945 02/10/24 1000  BP: (!) 95/53 (!) 93/51  Pulse: 90 84  Resp: 16 15  Temp:  36.8 C  SpO2: 97% 97%    Last Pain:  Vitals:   02/10/24 1000  TempSrc:   PainSc: 0-No pain                 Hiroki Wint,E. Terena Bohan

## 2024-02-10 NOTE — Anesthesia Procedure Notes (Signed)
 Anesthesia Regional Block: Interscalene brachial plexus block   Pre-Anesthetic Checklist: , timeout performed,  Correct Patient, Correct Site, Correct Laterality,  Correct Procedure, Correct Position, site marked,  Risks and benefits discussed,  Surgical consent,  Pre-op evaluation,  At surgeon's request and post-op pain management  Laterality: Left and Upper  Prep: chloraprep       Needles:  Injection technique: Single-shot  Needle Type: Echogenic Needle     Needle Length: 9cm  Needle Gauge: 21     Additional Needles:   Procedures:,,,, ultrasound used (permanent image in chart),,    Narrative:  Start time: 02/10/2024 6:58 AM End time: 02/10/2024 7:04 AM Injection made incrementally with aspirations every 5 mL.  Performed by: Personally  Anesthesiologist: Leonce Athens, MD  Additional Notes: Pt identified in Holding room.  Monitors applied. Working IV access confirmed. Timeout, Sterile prep L clavicle and neck.  #21ga ECHOgenic Arrow block needle to interscalene brachial plexus with US  guidance.  10cc 0.5% Bupivacaine  1:200k epi, Exparel  injected incrementally after negative test dose.  Patient asymptomatic, VSS, no heme aspirated, tolerated well.   JAYSON Leonce, MD

## 2024-02-10 NOTE — Interval H&P Note (Signed)
 History and Physical Interval Note:  02/10/2024 7:00 AM  Julia [name]  has presented today for surgery, with the diagnosis of left humerus fracture.  The various methods of treatment have been discussed with the patient and family. After consideration of risks, benefits and other options for treatment, the patient has consented to  Procedure(s): OPEN REDUCTION INTERNAL FIXATION (ORIF) PROXIMAL HUMERUS FRACTURE (Left) as a surgical intervention.  The patient's history has been reviewed, patient examined, no change in status, stable for surgery.  I have reviewed the patient's chart and labs.  Questions were answered to the patient's satisfaction.     Elspeth JONELLE Her

## 2024-02-10 NOTE — Brief Op Note (Signed)
 02/10/2024  9:30 AM  PATIENT:  Julia [name]  73 y.o. female  PRE-OPERATIVE DIAGNOSIS:  left humerus fracture, comminuted and displaced  POST-OPERATIVE DIAGNOSIS:  left humerus fracture, comminuted and displaced  PROCEDURE:  Procedure(s): OPEN REDUCTION INTERNAL FIXATION (ORIF) PROXIMAL HUMERUS FRACTURE (Left) Biomet ALPS prox humerus plate  SURGEON:  Surgeons and Role:    DEWAINE Kay Kemps, MD - Primary  PHYSICIAN ASSISTANT:   ASSISTANTS: Debby KATHEE Fireman, PA-C   ANESTHESIA:   regional and general  EBL:  200 mL   BLOOD ADMINISTERED:none  DRAINS: none   LOCAL MEDICATIONS USED:  MARCAINE      SPECIMEN:  No Specimen  DISPOSITION OF SPECIMEN:  N/A  COUNTS:  YES  TOURNIQUET:  * No tourniquets in log *  DICTATION: .Other Dictation: Dictation Number [id number]  PLAN OF CARE: Discharge to home after PACU  PATIENT DISPOSITION:  PACU - hemodynamically stable.   Delay start of Pharmacological VTE agent (>24hrs) due to surgical blood loss or risk of bleeding: not applicable

## 2024-02-10 NOTE — Anesthesia Procedure Notes (Signed)
 Procedure Name: Intubation Date/Time: 02/10/2024 7:44 AM  Performed by: Claudene Arlin LABOR, CRNAPre-anesthesia Checklist: Patient identified, Emergency Drugs available, Suction available and Patient being monitored Patient Re-evaluated:Patient Re-evaluated prior to induction Oxygen Delivery Method: Circle system utilized Preoxygenation: Pre-oxygenation with 100% oxygen Induction Type: IV induction Ventilation: Mask ventilation without difficulty Laryngoscope Size: Mac and 3 Grade View: Grade I Tube type: Oral Tube size: 7.0 mm Number of attempts: 1 Airway Equipment and Method: Stylet Placement Confirmation: ETT inserted through vocal cords under direct vision, positive ETCO2 and breath sounds checked- equal and bilateral Secured at: 21 cm Tube secured with: Tape Dental Injury: Teeth and Oropharynx as per pre-operative assessment

## 2024-02-10 NOTE — Transfer of Care (Signed)
 Immediate Anesthesia Transfer of Care Note  Patient: Julia [name]  Procedure(s) Performed: OPEN REDUCTION INTERNAL FIXATION (ORIF) PROXIMAL HUMERUS FRACTURE (Left)  Patient Location: PACU  Anesthesia Type:GA combined with regional for post-op pain  Level of Consciousness: awake, alert , oriented, and patient cooperative  Airway & Oxygen Therapy: Patient Spontanous Breathing and Patient connected to nasal cannula oxygen  Post-op Assessment: Report given to RN and Post -op Vital signs reviewed and stable  Post vital signs: Reviewed and stable  Last Vitals:  Vitals Value Taken Time  BP 108/56 02/10/24 09:33  Temp    Pulse 83 02/10/24 09:36  Resp 16 02/10/24 09:36  SpO2 99 % 02/10/24 09:36  Vitals shown include unfiled device data.  Last Pain:  Vitals:   02/10/24 9361  TempSrc: Oral         Complications: No notable events documented.

## 2024-02-10 NOTE — Op Note (Signed)
 NAMEGARI, TROVATO MEDICAL RECORD NO: [id number] ACCOUNT NO: [id number] DATE OF BIRTH: 12-12-1950 FACILITY: [facility] LOCATION: [facility] PHYSICIAN: Elspeth SAUNDERS. Kay, MD  Operative Report   DATE OF PROCEDURE: 02/10/2024   PREOPERATIVE DIAGNOSIS: Left displaced and comminuted proximal humerus fracture.  POSTOPERATIVE DIAGNOSIS: Left displaced and comminuted proximal humerus fracture.  PROCEDURE PERFORMED: Open reduction and internal fixation of left proximal humerus fracture using Biomet ALPS plate.  ATTENDING SURGEON: Elspeth SAUNDERS. Kay, MD  ASSISTANT: Debby Crock [place], [place], who was scrubbed during the entire procedure, and necessary for satisfactory completion of surgery.  ANESTHESIA: General anesthesia plus interscalene block.  ESTIMATED BLOOD LOSS: 200 mL.  FLUID REPLACEMENT: 1000 mL crystalloid.  INSTRUMENT COUNTS: Correct.  COMPLICATIONS: None.  ANTIBIOTICS: Perioperative antibiotics were given.  INDICATIONS: The patient is a 73 year old female who presents after a fall injuring her left shoulder. This occurred a week ago. The patient has a displaced and comminuted surgical neck humerus fracture. Given the extreme comminution and displacement, we  recommended open reduction and internal fixation to stabilize the fracture and allow for proper healing. Informed consent obtained.  DESCRIPTION OF PROCEDURE: After an adequate level of anesthesia was achieved, the patient was positioned in the modified beach chair position. The left shoulder was correctly identified and sterilely prepped and draped in the usual manner. C-arm was  draped into the field. A timeout was called, verifying the correct patient, correct site. We utilized a deltopectoral incision starting at the coracoid and extending down to the anterior humerus. Dissection down through subcutaneous tissues using Bovie.  The cephalic vein was identified and retracted with the deltoid laterally. We identified the clavipectoral  fascia and released that encountering fracture hematoma and seroma. We then gently teased away soft tissue from the fracture site. We used a  rongeur at the fracture site to remove soft tissue from the fracture. We then aligned the fracture directly both on the AP and lateral plane. We placed a four-hole proximal humerus contoured ALPS plate by Biomet along the lateral humerus just lateral to  the biceps groove at the appropriate height. We placed a single pin stabilizing 2.0 mm pin into the humeral head and then checked our height of the plate with the C-arm on the AP view. Once we had that height checked, we went ahead and placed a single  nonlocked bicortical screw in the sliding hole plate on the humeral shaft and fixed our height. With that in place, we went ahead then and checked our alignment on both AP and lateral views. Once we were satisfied, we had adequate alignment. We proceeded  to fill our cortical screws distally. The four screw holes that we utilized and we had two locked and two nonlocked using a crab claw clamp to clamp the plate down to the bone to make sure that that was secure. We had noticed that the patient had very,  very poor bone quality and there was extensive comminution at the fracture site. With the plate secured to the shaft with a total of eight cortices with decent bites, we went to the proximal screws and we basically used smooth pegs for all and those were  locking pegs and we placed those in the appropriate peg holes and locked those into place and all were checked for appropriate length on C-arm. Once we visualized it, our pegs were locked in and the pin was removed. We arranged the shoulder under  fluoroscopy. It was stable and the pegs were the appropriate height and the fracture  alignment was acceptable. Again, there was extensive comminution at the actual fracture site. I was very pleased with how the alignment of the fracture came together  despite the comminution.  I feel like the stability of the construct is good. Bone quality is poor. We are going to treat her very conservatively with our mobility and keep her in a sling. We irrigated thoroughly, used 1 g of vancomycin  powder in the  wound to prevent postop infection. We then closed the deep layer with 0 Vicryl suture, deltopectoral layer 2-0 Vicryl, subcutaneous closure, and staples for skin. Sterile dressing was applied and shoulder sling. The patient was transported to the  recovery room in stable condition.     Amey.Baba D: 02/10/2024 9:37:02 am T: 02/10/2024 9:51:00 am  JOB: [id number]/ [id number]

## 2024-02-10 NOTE — Discharge Instructions (Signed)
 Ice to the shoulder and arm constantly.  Keep the incision covered and clean and dry for one week, then ok to remove the big bandage and get it wet in the shower.  You may loosen the sling while seated and do gentle activity such as use your phone or do hand to face only.  DO NOT lift, push, or pull or reach behind your back or push up out of a chair with the operative arm.  Use a sling while you are up and around.  Keep pillow propped behind the operative elbow so that you arm remains in front of you and does not slide back.   Follow up with Dr Kay in two weeks in the office, call [phone] for appt  Please call Dr Kay (cell) [phone] with any questions or concerns

## 2024-02-12 ENCOUNTER — Encounter (HOSPITAL_COMMUNITY): Payer: Self-pay | Admitting: Orthopedic Surgery

## 2024-02-16 ENCOUNTER — Other Ambulatory Visit: Payer: Self-pay | Admitting: Internal Medicine

## 2024-02-20 ENCOUNTER — Other Ambulatory Visit: Payer: Self-pay

## 2024-02-20 ENCOUNTER — Encounter: Payer: Self-pay | Admitting: Internal Medicine

## 2024-02-20 ENCOUNTER — Telehealth: Payer: Self-pay

## 2024-02-20 DIAGNOSIS — M810 Age-related osteoporosis without current pathological fracture: Secondary | ICD-10-CM

## 2024-02-20 MED ORDER — ROMOSOZUMAB-AQQG 105 MG/1.17ML ~~LOC~~ SOSY
210.0000 mg | PREFILLED_SYRINGE | Freq: Once | SUBCUTANEOUS | Status: AC
  Administered 2024-03-04: 210 mg via SUBCUTANEOUS

## 2024-02-20 NOTE — Telephone Encounter (Signed)
 SABRA

## 2024-02-20 NOTE — Telephone Encounter (Signed)
 Evenity VOB initiated via [email or web address]  Last Evenity inj:  Next Evenity inj DUE:  [employer] [place]

## 2024-02-20 NOTE — Telephone Encounter (Signed)
 Copied from CRM [id number]. Topic: Clinical - Request for Lab/Test Order >> Feb 20, 2024 11:58 AM Franky GRADE wrote: Reason for CRM: Patient is calling to follow up on a Bone Density Dr.Burns wanted the patient to do, no active orders on file.

## 2024-02-20 NOTE — Telephone Encounter (Signed)
 Pt ready for scheduling for EVENITY  on or after : 02/20/24  Option# 1 Buy/Bill (Office supplied medication)  Out-of-pocket cost due at time of  office visit: $0  Number of injection/visits approved: ---  Primary: MEDICARE Evenity  co-insurance: 0% Admin fee co-insurance: 0%  Secondary: [employer] MEDSUP Evenity  co-insurance:  Admin fee co-insurance:   Medical Benefit Details: Date Benefits were checked: 02/20/24 Deductible: $257 Met of $257 Required/ Coinsurance: 0%/ Admin Fee: 0%  Prior Auth: N/A PA# Expiration Date:   # of doses approved: ------------------------------------------------------------------------- Option# 2- Med Obtained from pharmacy  Pharmacy benefit: Copay $--- (Paid to pharmacy) Admin Fee: --- (Pay at clinic)  Prior Auth: --- PA# Expiration Date:   # of doses approved:  If patient wants fill through the pharmacy benefit please send prescription to: ---, and include estimated need by date in rx notes. Pharmacy will ship medication directly to the office.  Patient NOT eligible for Evenity  Copay Card. Copay Card can make patient's cost as little as $25. Link to apply: [email or web address]://[email or web address].[email or web address]/copay   This summary of benefits is an estimation of the patient's out-of-pocket cost. Exact cost may very based on individual plan coverage.

## 2024-02-21 NOTE — Telephone Encounter (Unsigned)
 Copied from CRM [id number]. Topic: Appointments - Appointment Scheduling >> Feb 20, 2024 11:57 AM Julia [name] wrote: Patient/patient representative is calling to schedule an appointment. Refer to attachments for appointment information.  Patient is calling to schedule her Evenity  injection on August 25 per Dr.Burn's instructions.

## 2024-02-22 DIAGNOSIS — Z4789 Encounter for other orthopedic aftercare: Secondary | ICD-10-CM | POA: Diagnosis not present

## 2024-03-04 ENCOUNTER — Ambulatory Visit (INDEPENDENT_AMBULATORY_CARE_PROVIDER_SITE_OTHER)
Admission: RE | Admit: 2024-03-04 | Discharge: 2024-03-04 | Disposition: A | Discharge status: Home / Self Care | Source: Ambulatory Visit | Attending: Internal Medicine

## 2024-03-04 ENCOUNTER — Ambulatory Visit (INDEPENDENT_AMBULATORY_CARE_PROVIDER_SITE_OTHER)

## 2024-03-04 DIAGNOSIS — M810 Age-related osteoporosis without current pathological fracture: Secondary | ICD-10-CM

## 2024-03-04 MED ORDER — ROMOSOZUMAB-AQQG 105 MG/1.17ML ~~LOC~~ SOSY
210.0000 mg | PREFILLED_SYRINGE | SUBCUTANEOUS | Status: AC
  Administered 2024-04-08: 210 mg via SUBCUTANEOUS

## 2024-03-04 NOTE — Progress Notes (Signed)
 Evenity  injection given w/o complications

## 2024-03-07 ENCOUNTER — Ambulatory Visit: Payer: Self-pay | Admitting: Internal Medicine

## 2024-03-07 DIAGNOSIS — D2261 Melanocytic nevi of right upper limb, including shoulder: Secondary | ICD-10-CM | POA: Diagnosis not present

## 2024-03-07 DIAGNOSIS — D692 Other nonthrombocytopenic purpura: Secondary | ICD-10-CM | POA: Diagnosis not present

## 2024-03-07 DIAGNOSIS — L821 Other seborrheic keratosis: Secondary | ICD-10-CM | POA: Diagnosis not present

## 2024-03-07 DIAGNOSIS — C44529 Squamous cell carcinoma of skin of other part of trunk: Secondary | ICD-10-CM | POA: Diagnosis not present

## 2024-03-07 DIAGNOSIS — D2262 Melanocytic nevi of left upper limb, including shoulder: Secondary | ICD-10-CM | POA: Diagnosis not present

## 2024-03-07 DIAGNOSIS — D485 Neoplasm of uncertain behavior of skin: Secondary | ICD-10-CM | POA: Diagnosis not present

## 2024-03-07 DIAGNOSIS — D225 Melanocytic nevi of trunk: Secondary | ICD-10-CM | POA: Diagnosis not present

## 2024-03-07 DIAGNOSIS — D2272 Melanocytic nevi of left lower limb, including hip: Secondary | ICD-10-CM | POA: Diagnosis not present

## 2024-03-07 DIAGNOSIS — D2271 Melanocytic nevi of right lower limb, including hip: Secondary | ICD-10-CM | POA: Diagnosis not present

## 2024-03-09 ENCOUNTER — Other Ambulatory Visit: Payer: Self-pay | Admitting: Internal Medicine

## 2024-03-10 ENCOUNTER — Other Ambulatory Visit: Payer: Self-pay | Admitting: Internal Medicine

## 2024-03-19 DIAGNOSIS — M810 Age-related osteoporosis without current pathological fracture: Secondary | ICD-10-CM | POA: Diagnosis not present

## 2024-03-19 DIAGNOSIS — Z780 Asymptomatic menopausal state: Secondary | ICD-10-CM | POA: Diagnosis not present

## 2024-03-19 DIAGNOSIS — Z01419 Encounter for gynecological examination (general) (routine) without abnormal findings: Secondary | ICD-10-CM | POA: Diagnosis not present

## 2024-03-22 ENCOUNTER — Other Ambulatory Visit: Payer: Self-pay

## 2024-03-22 MED ORDER — COVID-19 MRNA VAC-TRIS(PFIZER) 30 MCG/0.3ML IM SUSY: 0.3000 mL | PREFILLED_SYRINGE | Freq: Once | INTRAMUSCULAR | 0 refills | Status: DC

## 2024-03-22 MED ORDER — COVID-19 MRNA VAC-TRIS(PFIZER) 30 MCG/0.3ML IM SUSY: 0.3000 mL | PREFILLED_SYRINGE | Freq: Once | INTRAMUSCULAR | 0 refills | Status: AC

## 2024-03-26 DIAGNOSIS — Z4789 Encounter for other orthopedic aftercare: Secondary | ICD-10-CM | POA: Diagnosis not present

## 2024-04-02 DIAGNOSIS — Z23 Encounter for immunization: Secondary | ICD-10-CM | POA: Diagnosis not present

## 2024-04-04 ENCOUNTER — Other Ambulatory Visit: Payer: Self-pay | Admitting: Internal Medicine

## 2024-04-08 ENCOUNTER — Ambulatory Visit (INDEPENDENT_AMBULATORY_CARE_PROVIDER_SITE_OTHER)

## 2024-04-08 ENCOUNTER — Ambulatory Visit

## 2024-04-08 DIAGNOSIS — M25512 Pain in left shoulder: Secondary | ICD-10-CM | POA: Diagnosis not present

## 2024-04-08 DIAGNOSIS — M810 Age-related osteoporosis without current pathological fracture: Secondary | ICD-10-CM

## 2024-04-08 MED ORDER — ROMOSOZUMAB-AQQG 105 MG/1.17ML ~~LOC~~ SOSY
210.0000 mg | PREFILLED_SYRINGE | Freq: Once | SUBCUTANEOUS | Status: AC
  Administered 2024-05-09: 210 mg via SUBCUTANEOUS

## 2024-04-08 NOTE — Progress Notes (Signed)
 After obtaining consent, and per orders of Dr. Lawerance Bach, injection of Evenity given by Ferdie Ping. Patient instructed to report any adverse reaction to me immediately.

## 2024-04-15 DIAGNOSIS — M25512 Pain in left shoulder: Secondary | ICD-10-CM | POA: Diagnosis not present

## 2024-04-22 ENCOUNTER — Encounter

## 2024-04-29 DIAGNOSIS — Z5181 Encounter for therapeutic drug level monitoring: Secondary | ICD-10-CM | POA: Diagnosis not present

## 2024-04-29 DIAGNOSIS — Z7960 Long term (current) use of unspecified immunomodulators and immunosuppressants: Secondary | ICD-10-CM | POA: Diagnosis not present

## 2024-04-29 DIAGNOSIS — M0609 Rheumatoid arthritis without rheumatoid factor, multiple sites: Secondary | ICD-10-CM | POA: Diagnosis not present

## 2024-05-07 ENCOUNTER — Other Ambulatory Visit: Payer: Self-pay | Admitting: Internal Medicine

## 2024-05-07 ENCOUNTER — Ambulatory Visit
Admission: RE | Admit: 2024-05-07 | Discharge: 2024-05-07 | Disposition: A | Discharge status: Home / Self Care | Source: Ambulatory Visit | Attending: Internal Medicine | Admitting: Internal Medicine

## 2024-05-07 DIAGNOSIS — M25512 Pain in left shoulder: Secondary | ICD-10-CM | POA: Diagnosis not present

## 2024-05-07 DIAGNOSIS — Z1231 Encounter for screening mammogram for malignant neoplasm of breast: Secondary | ICD-10-CM

## 2024-05-07 DIAGNOSIS — R928 Other abnormal and inconclusive findings on diagnostic imaging of breast: Secondary | ICD-10-CM | POA: Diagnosis not present

## 2024-05-07 DIAGNOSIS — N6489 Other specified disorders of breast: Secondary | ICD-10-CM

## 2024-05-08 ENCOUNTER — Ambulatory Visit

## 2024-05-09 ENCOUNTER — Ambulatory Visit

## 2024-05-09 DIAGNOSIS — M810 Age-related osteoporosis without current pathological fracture: Secondary | ICD-10-CM | POA: Diagnosis not present

## 2024-05-09 DIAGNOSIS — Z4789 Encounter for other orthopedic aftercare: Secondary | ICD-10-CM | POA: Diagnosis not present

## 2024-05-09 NOTE — Progress Notes (Signed)
 After obtaining consent, and per orders of Dr. Lawerance Bach, injection of Evenity given by Ferdie Ping. Patient instructed to report any adverse reaction to me immediately.

## 2024-05-15 DIAGNOSIS — M25512 Pain in left shoulder: Secondary | ICD-10-CM | POA: Diagnosis not present

## 2024-05-22 ENCOUNTER — Emergency Department (HOSPITAL_COMMUNITY)
Admission: EM | Admit: 2024-05-22 | Discharge: 2024-05-23 | Disposition: A | Discharge status: Home / Self Care | Attending: Emergency Medicine | Admitting: Emergency Medicine

## 2024-05-22 ENCOUNTER — Emergency Department (HOSPITAL_COMMUNITY)

## 2024-05-22 DIAGNOSIS — I4891 Unspecified atrial fibrillation: Secondary | ICD-10-CM | POA: Diagnosis not present

## 2024-05-22 DIAGNOSIS — M8580 Other specified disorders of bone density and structure, unspecified site: Secondary | ICD-10-CM | POA: Diagnosis not present

## 2024-05-22 DIAGNOSIS — Y907 Blood alcohol level of 200-239 mg/100 ml: Secondary | ICD-10-CM | POA: Diagnosis not present

## 2024-05-22 DIAGNOSIS — Y92511 Restaurant or cafe as the place of occurrence of the external cause: Secondary | ICD-10-CM | POA: Diagnosis not present

## 2024-05-22 DIAGNOSIS — Z7901 Long term (current) use of anticoagulants: Secondary | ICD-10-CM | POA: Diagnosis not present

## 2024-05-22 DIAGNOSIS — Z23 Encounter for immunization: Secondary | ICD-10-CM | POA: Diagnosis not present

## 2024-05-22 DIAGNOSIS — S0003XA Contusion of scalp, initial encounter: Secondary | ICD-10-CM | POA: Diagnosis not present

## 2024-05-22 DIAGNOSIS — Y9301 Activity, walking, marching and hiking: Secondary | ICD-10-CM | POA: Diagnosis not present

## 2024-05-22 DIAGNOSIS — W19XXXA Unspecified fall, initial encounter: Secondary | ICD-10-CM | POA: Insufficient documentation

## 2024-05-22 DIAGNOSIS — S3993XA Unspecified injury of pelvis, initial encounter: Secondary | ICD-10-CM | POA: Diagnosis not present

## 2024-05-22 DIAGNOSIS — S299XXA Unspecified injury of thorax, initial encounter: Secondary | ICD-10-CM | POA: Diagnosis not present

## 2024-05-22 DIAGNOSIS — Z79899 Other long term (current) drug therapy: Secondary | ICD-10-CM | POA: Diagnosis not present

## 2024-05-22 DIAGNOSIS — I10 Essential (primary) hypertension: Secondary | ICD-10-CM | POA: Insufficient documentation

## 2024-05-22 DIAGNOSIS — S0990XA Unspecified injury of head, initial encounter: Secondary | ICD-10-CM | POA: Diagnosis not present

## 2024-05-22 DIAGNOSIS — R918 Other nonspecific abnormal finding of lung field: Secondary | ICD-10-CM | POA: Diagnosis not present

## 2024-05-22 DIAGNOSIS — G319 Degenerative disease of nervous system, unspecified: Secondary | ICD-10-CM | POA: Diagnosis not present

## 2024-05-22 DIAGNOSIS — E039 Hypothyroidism, unspecified: Secondary | ICD-10-CM | POA: Diagnosis not present

## 2024-05-22 DIAGNOSIS — S0101XA Laceration without foreign body of scalp, initial encounter: Secondary | ICD-10-CM | POA: Insufficient documentation

## 2024-05-22 DIAGNOSIS — F10920 Alcohol use, unspecified with intoxication, uncomplicated: Secondary | ICD-10-CM | POA: Insufficient documentation

## 2024-05-22 DIAGNOSIS — S199XXA Unspecified injury of neck, initial encounter: Secondary | ICD-10-CM | POA: Diagnosis not present

## 2024-05-22 DIAGNOSIS — Q211 Atrial septal defect, unspecified: Secondary | ICD-10-CM | POA: Diagnosis not present

## 2024-05-22 LAB — CBC
HCT: 43 % (ref 36.0–46.0)
Hemoglobin: 13.5 g/dL (ref 12.0–15.0)
MCH: 31.7 pg (ref 26.0–34.0)
MCHC: 31.4 g/dL (ref 30.0–36.0)
MCV: 100.9 fL — ABNORMAL HIGH (ref 80.0–100.0)
Platelets: 274 K/uL (ref 150–400)
RBC: 4.26 MIL/uL (ref 3.87–5.11)
RDW: 13.4 % (ref 11.5–15.5)
WBC: 9.8 K/uL (ref 4.0–10.5)
nRBC: 0 % (ref 0.0–0.2)

## 2024-05-22 LAB — COMPREHENSIVE METABOLIC PANEL WITH GFR
ALT: 23 U/L (ref 0–44)
AST: 28 U/L (ref 15–41)
Albumin: 4 g/dL (ref 3.5–5.0)
Alkaline Phosphatase: 78 U/L (ref 38–126)
Anion gap: 13 (ref 5–15)
BUN: 12 mg/dL (ref 8–23)
CO2: 20 mmol/L — ABNORMAL LOW (ref 22–32)
Calcium: 8.8 mg/dL — ABNORMAL LOW (ref 8.9–10.3)
Chloride: 96 mmol/L — ABNORMAL LOW (ref 98–111)
Creatinine, Ser: 0.49 mg/dL (ref 0.44–1.00)
GFR, Estimated: >60 mL/min (ref >60)
Glucose, Bld: 85 mg/dL (ref 70–99)
Potassium: 4.2 mmol/L (ref 3.5–5.1)
Sodium: 129 mmol/L — ABNORMAL LOW (ref 135–145)
Total Bilirubin: 0.5 mg/dL (ref 0.0–1.2)
Total Protein: 6.7 g/dL (ref 6.5–8.1)

## 2024-05-22 LAB — ETHANOL: Alcohol, Ethyl (B): 236 mg/dL — ABNORMAL HIGH (ref <15)

## 2024-05-22 MED ORDER — LIDOCAINE HCL (PF) 1 % IJ SOLN
5.0000 mL | Freq: Once | INTRAMUSCULAR | Status: AC
  Administered 2024-05-23: 5 mL via INTRADERMAL
  Filled 2024-05-22: qty 5

## 2024-05-22 MED ORDER — TETANUS-DIPHTH-ACELL PERTUSSIS 5-2-15.5 LF-MCG/0.5 IM SUSP
0.5000 mL | Freq: Once | INTRAMUSCULAR | Status: AC
  Administered 2024-05-22: 0.5 mL via INTRAMUSCULAR
  Filled 2024-05-22: qty 0.5

## 2024-05-22 NOTE — ED Notes (Signed)
 Wound cleaned and dressed.

## 2024-05-22 NOTE — ED Notes (Signed)
 Patient transported to CT

## 2024-05-22 NOTE — Progress Notes (Signed)
 Orthopedic Tech Progress Note Patient Details:  Julia [name] [birth year] [id number] LV2T FOT. Xray in the room currently. No orders at this time.  Patient ID: Julia [name], female   DOB: [birth year], 73 y.o.   MRN: [id number]  Giovanni LITTIE [name] 05/22/2024, 10:20 PM

## 2024-05-22 NOTE — ED Triage Notes (Signed)
 Pt BIB [facility] from restaurant, mechanical fall walking, endorses alcohol . On eliquis , pt denies LOC, or pain. Head lac noted on back of head.    130/60 99%RA 70HR 121CBG

## 2024-05-22 NOTE — ED Provider Notes (Signed)
  EMERGENCY DEPARTMENT AT [facility] Provider Note  CSN: [id number] Arrival date & time: 05/22/24 2202  Chief Complaint(s) No chief complaint on file.  HPI Julia [name] is a 73 y.o. female history of hypertension, rheumatoid arthritis, atrial fibrillation on Eliquis  presenting to the emergency department after fall.  The patient was at a restaurant and had numerous alcoholic drinks per EMS.  She got up and had a fall which was witnessed.  No LOC.  Did hit the back of her head.  Patient denies any other complaints.  Denies any headaches, chest pain, pain in the arms or legs.  Endorses drinking alcohol .  Denies any neck pain.   Past Medical History Past Medical History:  Diagnosis Date   Adjustment disorder with mixed anxiety and depressed mood    Age-related osteoporosis without current pathological fracture 01/14/2017   07/2018 - spine  -2.3,  RFN  -1.9,  LFN  -2.3   - improvement in hips  dexa 07/25/17 - spine  -3.1,   RFN  -2.2,  LFN  -2.4  - started on prolia   DEXA 05/13/2010 in [place] , osteopenia   Spine: -2.3   LFN  -1.7   RFN:  -1.8      FRAX  15%, 1.9%        Aortic atherosclerosis 01/20/2022   Noted on abdominal CT 12/2021     Arthritis    Body mass index (BMI) 31.0-31.9, adult    Confusional arousals 09/06/2022   Constipation 12/22/2021   Elevated LFTs 07/15/2022   Fatigue    Frequent falls    hx falls on 01/2024, 07/2023, 01/2023, 02/2022   GERD (gastroesophageal reflux disease) 01/13/2017   Hemangioma 04/18/2017   History of breast augmentation    History of colon polyps 01/14/2017   Last colonoscopy 2018 - no polyps - advised 5 years   History of gastric bypass    History of pelvis fracture    in 30's   History of tuberculosis exposure 01/14/2017   Hx of latent TB, treated for 9 months   Hyperglycemia 09/28/2020   Hypertension    Hyponatremia 07/15/2022   Hypothyroidism 01/14/2017   Laceration of right hand without foreign body 07/24/2023    Melanocytic nevi, unspecified 04/18/2017   Menopause 12/25/2019   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   Osteoporosis 01/14/2017   07/2018 - spine  -2.3,  RFN  -1.9,  LFN  -2.3   - improvement in hips dexa 07/25/17 - spine  -3.1,   RFN  -2.2,  LFN  -2.4  - started on prolia  DEXA 05/13/2010 in [place] , osteopenia   Spine: -2.3   LFN  -1.7   RFN:  -1.8     FRAX  15%, 1.9%    Paresthesia 07/17/2018   Paroxysmal atrial fibrillation 09/29/2020   PFO with atrial septal aneurysm 06/19/2020   repaired in 05/27/2020   Plant dermatitis 01/08/2020   PND (post-nasal drip) 11/08/2022   Pneumonia    x 2   Positive QuantiFERON-TB Gold test    Purpura 04/18/2017   Rheumatoid arthritis 01/13/2017   Following at [facility]ology  Has been on Prednisone , Plaquenil, Arava, Humira, etolodac, Enbrel, Orencia, Remicade and Rituxan      Sebaceous cyst 04/18/2017   Spells of decreased attentiveness 09/06/2022   Steatorrhea 11/08/2022   Stroke (HCC) 04/22/2020   Subjective memory complaints 07/24/2023   TIA (transient ischemic attack) 04/22/2020   Ventral hernia without obstruction or gangrene 11/01/2019   Noted on Ct scan 10/2019  Vitamin D  deficiency 06/22/2017   Patient Active Problem List   Diagnosis Date Noted   Subarachnoid hemorrhage (HCC) 11/02/2023   Subdural hematoma (HCC) 11/02/2023   Compression fracture of thoracic vertebra (HCC), T3, T4 11/02/2023   Compression fracture of T3 vertebra (HCC) 11/01/2023   Compression fracture of T9 vertebra (HCC) 11/01/2023   Body mass index (BMI) 31.0-31.9, adult    Positive QuantiFERON-TB Gold test    Subjective memory complaints 07/24/2023   PND (post-nasal drip) 11/08/2022   Steatorrhea 11/08/2022   Hyponatremia 07/15/2022   Elevated LFTs 07/15/2022   Aortic atherosclerosis 01/20/2022   Paroxysmal atrial fibrillation 09/29/2020   PFO with atrial septal aneurysm s/p closure 06/19/2020   TIA (transient ischemic attack) 04/22/2020   Adjustment  disorder with mixed anxiety and depressed mood    Ventral hernia without obstruction or gangrene 11/01/2019   Hypertension 08/30/2019   Paresthesia 07/17/2018   Vitamin D  deficiency 06/22/2017   Hemangioma 04/18/2017   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 Authorizing Provider  amLODipine  (NORVASC ) 2.5 MG tablet TAKE 1 TABLET(2.5 MG) BY MOUTH DAILY 03/12/24   Geofm Glade PARAS, MD  Ascorbic Acid (VITAMIN C PO) Take 3,200 mg by mouth daily.    [provider]  Cholecalciferol (VITAMIN D3) 250 MCG (10000 UT) TABS Take 5,000 Units by mouth 2 (two) times daily.     [provider]  Coenzyme Q10 (COQ-10) 100 MG CAPS Take 200 mg by mouth daily.    [provider]  ELIQUIS  5 MG TABS tablet TAKE 1 TABLET(5 MG) BY MOUTH TWICE DAILY 09/07/23   Cooper, Michael, MD  Krill Oil 1000 MG CAPS Take 1,000 mg by mouth daily.    [provider]  levothyroxine  (SYNTHROID ) 25 MCG tablet TAKE 1 TABLET(25 MCG) BY MOUTH DAILY 10/04/23   Geofm Glade PARAS, MD  milk thistle 175 MG tablet Take 175 mg by mouth daily.    [provider]  Misc Natural Products ([employer] GLUCOS-CHONDROIT-MSM TS) TABS Take 2 tablets by mouth at bedtime.    [provider]  Multiple Vitamins-Minerals (PRESERVISION AREDS 2+MULTI VIT PO) Take 1 capsule by mouth in the morning and at bedtime. 01/26/24   [provider]  oxyCODONE -acetaminophen  (PERCOCET/ROXICET) 5-325 MG tablet Take 1 tablet by mouth every 6 (six) hours as needed for severe pain (pain score 7-10). 02/02/24   Neysa Caron PARAS, DO  riTUXimab  (RITUXAN ) 500  MG/50ML injection Inject 10 mg into the vein every 6 (six) months.    [provider]  triamcinolone  ointment (KENALOG ) 0.5 % Apply topically 2 (two) times daily. APPLY TOPICALLY TO THE AFFECTED AREA TWICE DAILY Patient taking differently: Apply 1 Application topically 2 (two) times daily as needed (rash). 07/27/23   Geofm Glade PARAS, MD  valACYclovir  (VALTREX ) 500 MG tablet TAKE 1 TABLET(500 MG) BY MOUTH DAILY 03/23/23   Geofm Glade PARAS, MD  venlafaxine  XR (EFFEXOR -XR) 150 MG 24 hr capsule TAKE 1 CAPSULE(150 MG) BY MOUTH DAILY WITH BREAKFAST 02/19/24   Geofm Glade PARAS, MD  venlafaxine  XR (EFFEXOR -XR) 75 MG 24 hr capsule TAKE 1 CAPSULE(75 MG) BY MOUTH DAILY WITH BREAKFAST 04/04/24   Geofm Glade PARAS, MD  Past Surgical History Past Surgical History:  Procedure Laterality Date   APPENDECTOMY     AUGMENTATION MAMMAPLASTY     breast enhancement removal Bilateral 03/2022   BREAST ENHANCEMENT SURGERY Bilateral    BUBBLE STUDY  05/27/2020   Procedure: BUBBLE STUDY;  Surgeon: Barbaraann Darryle Ned, MD;  Location: [facility] ENDOSCOPY;  Service: Cardiovascular;;   CARDIAC CATHETERIZATION  06/19/2020   COLONOSCOPY     x several - last one on 11/06/23   GASTRIC BYPASS     ORIF HUMERUS FRACTURE Left 02/10/2024   Procedure: OPEN REDUCTION INTERNAL FIXATION (ORIF) PROXIMAL HUMERUS FRACTURE;  Surgeon: Kay Kemps, MD;  Location: [facility] OR;  Service: Orthopedics;  Laterality: Left;   PATENT FORAMEN OVALE(PFO) CLOSURE N/A 06/19/2020   Procedure: PATENT FORAMEN OVALE (PFO) CLOSURE;  Surgeon: Wonda Sharper, MD;  Location: [facility] INVASIVE CV LAB;  Service: Cardiovascular;  Laterality: N/A;   TEE WITHOUT CARDIOVERSION N/A 05/27/2020   Procedure: TRANSESOPHAGEAL ECHOCARDIOGRAM (TEE);  Surgeon: Barbaraann Darryle Ned, MD;  Location: [facility] ENDOSCOPY;  Service: Cardiovascular;  Laterality: N/A;    TONSILLECTOMY     UPPER GI ENDOSCOPY  09/06/2018   Family History Family History  Problem Relation Age of Onset   Hypertension Mother    Heart disease Mother    Heart failure Mother    Hypertension Father    Ulcers Father    Gallstones Father    Kidney Stones Father    Hypertension Sister    Hypertension Brother    Heart attack Maternal Grandmother    Colon cancer Neg Hx    Stomach cancer Neg Hx    Esophageal cancer Neg Hx    Alzheimer's disease Neg Hx    Dementia Neg Hx     Social History Social History   Tobacco Use   Smoking status: Never   Smokeless tobacco: Never  Vaping Use   Vaping status: Never Used  Substance Use Topics   Alcohol  use: Yes    Alcohol /week: 7.0 - 14.0 standard drinks of alcohol     Types: 7 - 14 Glasses of wine per week    Comment: 1-2 glasses of wine/beer nightly   Drug use: Never   Allergies Patient has no known allergies.  Review of Systems Review of Systems  All other systems reviewed and are negative.   Physical Exam Vital Signs  I have reviewed the triage vital signs BP 118/72   Pulse 72   Temp (!) 97.3 F (36.3 C)   Resp 16   Ht 5' 3 (1.6 m)   Wt 53.1 kg   SpO2 100%   BMI 20.73 kg/m  Physical Exam Vitals and nursing note reviewed.  Constitutional:      General: She is not in acute distress.    Appearance: She is well-developed.     Comments: Intoxicated appearing  HENT:     Head: Normocephalic.     Comments: Matted blood to the back of the head    Mouth/Throat:     Mouth: Mucous membranes are moist.  Eyes:     Pupils: Pupils are equal, round, and reactive to light.  Cardiovascular:     Rate and Rhythm: Normal rate and regular rhythm.     Heart sounds: No murmur heard. Pulmonary:     Effort: Pulmonary effort is normal. No respiratory distress.     Breath sounds: Normal breath sounds.  Abdominal:     General: Abdomen is flat.     Palpations: Abdomen is soft.     Tenderness: There  is no abdominal tenderness.   Musculoskeletal:        General: No tenderness.     Right lower leg: No edema.     Left lower leg: No edema.     Comments: No midline C, T, L-spine tenderness.  No chest wall tenderness or crepitus.  Full painless range of motion at the bilateral upper extremities including the shoulders, elbows, wrists, hand and fingers, and in the bilateral lower extremities including the hips, knees, ankle, toes.  No focal bony tenderness, injury or deformity.  Skin:    General: Skin is warm and dry.  Neurological:     General: No focal deficit present.     Mental Status: She is alert. Mental status is at baseline.     Cranial Nerves: No cranial nerve deficit.     Comments: Slurred speech likely due to alcohol    Psychiatric:        Mood and Affect: Mood normal.        Behavior: Behavior normal.     ED Results and Treatments Labs (all labs ordered are listed, but only abnormal results are displayed) Labs Reviewed  COMPREHENSIVE METABOLIC PANEL WITH GFR - Abnormal; Notable for the following components:      Result Value   Sodium 129 (*)    Chloride 96 (*)    CO2 20 (*)    Calcium 8.8 (*)    All other components within normal limits  CBC - Abnormal; Notable for the following components:   MCV 100.9 (*)    All other components within normal limits  ETHANOL - Abnormal; Notable for the following components:   Alcohol , Ethyl (B) 236 (*)    All other components within normal limits                                                                                                                          Radiology CT HEAD WO CONTRAST Result Date: 05/22/2024 CLINICAL DATA:  Polytrauma fall EXAM: CT HEAD WITHOUT CONTRAST CT CERVICAL SPINE WITHOUT CONTRAST TECHNIQUE: Multidetector CT imaging of the head and cervical spine was performed following the standard protocol without intravenous contrast. Multiplanar CT image reconstructions of the cervical spine were also generated. RADIATION DOSE REDUCTION:  This exam was performed according to the departmental dose-optimization program which includes automated exposure control, adjustment of the mA and/or kV according to patient size and/or use of iterative reconstruction technique. COMPARISON:  CT brain 11/07/2023, CT brain and cervical spine 08/09/2023 FINDINGS: CT HEAD FINDINGS Brain: No acute territorial infarction, hemorrhage or intracranial mass. Mild atrophy. Nonenlarged ventricles Vascular: No hyperdense vessels.  Carotid vascular calcification Skull: Normal. Negative for fracture or focal lesion. Sinuses/Orbits: No acute finding. Other: Right posterior parietal scalp hematoma and laceration. Right greater than left cheek hematomas are suspected. CT CERVICAL SPINE FINDINGS Alignment: No subluxation.  Facet alignment is within normal limits Skull base and vertebrae: No acute fracture. No primary bone lesion or focal pathologic process. Soft tissues  and spinal canal: No prevertebral fluid or swelling. No visible canal hematoma. Disc levels: Moderate diffuse disc space narrowing C3 through C7. Multilevel foraminal narrowing. No high-grade canal stenosis Upper chest: Negative. Other: None IMPRESSION: 1. No CT evidence for acute intracranial abnormality. Mild atrophy. 2. Degenerative changes of the cervical spine. No acute osseous abnormality. Electronically Signed   By: Luke Bun M.D.   On: 05/22/2024 22:42   CT CERVICAL SPINE WO CONTRAST Result Date: 05/22/2024 CLINICAL DATA:  Polytrauma fall EXAM: CT HEAD WITHOUT CONTRAST CT CERVICAL SPINE WITHOUT CONTRAST TECHNIQUE: Multidetector CT imaging of the head and cervical spine was performed following the standard protocol without intravenous contrast. Multiplanar CT image reconstructions of the cervical spine were also generated. RADIATION DOSE REDUCTION: This exam was performed according to the departmental dose-optimization program which includes automated exposure control, adjustment of the mA and/or kV  according to patient size and/or use of iterative reconstruction technique. COMPARISON:  CT brain 11/07/2023, CT brain and cervical spine 08/09/2023 FINDINGS: CT HEAD FINDINGS Brain: No acute territorial infarction, hemorrhage or intracranial mass. Mild atrophy. Nonenlarged ventricles Vascular: No hyperdense vessels.  Carotid vascular calcification Skull: Normal. Negative for fracture or focal lesion. Sinuses/Orbits: No acute finding. Other: Right posterior parietal scalp hematoma and laceration. Right greater than left cheek hematomas are suspected. CT CERVICAL SPINE FINDINGS Alignment: No subluxation.  Facet alignment is within normal limits Skull base and vertebrae: No acute fracture. No primary bone lesion or focal pathologic process. Soft tissues and spinal canal: No prevertebral fluid or swelling. No visible canal hematoma. Disc levels: Moderate diffuse disc space narrowing C3 through C7. Multilevel foraminal narrowing. No high-grade canal stenosis Upper chest: Negative. Other: None IMPRESSION: 1. No CT evidence for acute intracranial abnormality. Mild atrophy. 2. Degenerative changes of the cervical spine. No acute osseous abnormality. Electronically Signed   By: Luke Bun M.D.   On: 05/22/2024 22:42   DG Chest Port 1 View Result Date: 05/22/2024 EXAM: 1 VIEW(S) XRAY OF THE CHEST 05/22/2024 10:21:48 PM COMPARISON: PA and lateral chest 10/15/2022. Chest CT from outside facility 10/28/2023. CLINICAL HISTORY: Trauma. FINDINGS: LUNGS AND PLEURA: Low inspiration on the exam. There is increased streaky opacity in the retrocardiac left lower lobe, which could be asymmetric passive atelectasis or an early infiltrate. The remainder of the hypoinflated lungs are clear. No pleural effusion. No pneumothorax. HEART AND MEDIASTINUM: Calcified left hilar and AP window lymph nodes. Mild cardiomegaly is unchanged. ASD closure device is again noted in the heart. BONES AND SOFT TISSUES: Numerous surgical clips again  noted in the left upper abdomen. No acute osseous abnormality. IMPRESSION: 1. Increased streaky retrocardiac left lower lobe opacity, which may represent asymmetric passive atelectasis versus early infiltrate. Low inspiration exam. 2. Stable cardiomegaly. Electronically signed by: Francis Quam MD 05/22/2024 10:30 PM EST RP Workstation: [id number]   DG Pelvis Portable Result Date: 05/22/2024 CLINICAL DATA:  Trauma EXAM: PORTABLE PELVIS 1-2 VIEWS COMPARISON:  None Available. FINDINGS: The bones are osteopenic. There is no evidence of pelvic fracture or diastasis. No pelvic bone lesions are seen. IMPRESSION: Negative. Electronically Signed   By: Greig Pique M.D.   On: 05/22/2024 22:26    Pertinent labs & imaging results that were available during my care of the patient were reviewed by me and considered in my medical decision making (see MDM for details).  Medications Ordered in ED Medications  lidocaine  (PF) (XYLOCAINE ) 1 % injection 5 mL (has no administration in time range)  Tdap (ADACEL) injection 0.5 mL (0.5  mLs Intramuscular Given 05/22/24 2253)                                                                                                                                     Procedures .Laceration Repair  Date/Time: 05/22/2024 11:10 PM  Performed by: Francesca Elsie CROME, MD Authorized by: Francesca Elsie CROME, MD   Consent:    Consent obtained:  Verbal   Consent given by:  Patient   Risks, benefits, and alternatives were discussed: yes     Risks discussed:  Infection, need for additional repair, nerve damage, pain, vascular damage, poor wound healing, poor cosmetic result, retained foreign body and tendon damage   Alternatives discussed:  No treatment Universal protocol:    Patient identity confirmed:  Verbally with patient and arm band Laceration details:    Location:  Scalp   Scalp location:  Occipital   Length (cm):  2 Exploration:    Wound exploration: wound explored  through full range of motion and entire depth of wound visualized     Wound extent: areolar tissue not violated, fascia not violated, no foreign body, no signs of injury, no nerve damage, no tendon damage, no underlying fracture and no vascular damage     Contaminated: no   Treatment:    Area cleansed with:  Saline   Amount of cleaning:  Standard   Irrigation solution:  Sterile saline   Visualized foreign bodies/material removed: no     Undermining:  None   Scar revision: no   Skin repair:    Repair method:  Staples   Number of staples:  4 Approximation:    Approximation:  Close Repair type:    Repair type:  Simple Post-procedure details:    Procedure completion:  Tolerated well, no immediate complications   (including critical care time)  Medical Decision Making / ED Course   MDM:  73 year old presenting to the emergency department with fall.  Patient overall well-appearing, patient did have a fall at restaurant which was witnessed.  Patient had no loss of consciousness.  Fall seems most likely related to patient's alcoholic intoxication.  Will check some additional laboratory testing..  Will obtain CT head and CT cervical spine.  Examination is without evidence of other trauma besides posterior scalp injury.  Wound will be thoroughly irrigated and repaired if indicated.  Patient will likely need observation even if imaging is negative but she appears very intoxicated  Clinical Course as of 05/22/24 2312  Wed May 22, 2024  2308 CT scans negative. Wound irrigated and repaired. Will need to metabolize prior to safe discharge. Patient etoh level is elevated. Signed out to Dr. Griselda pending this.  [WS]    Clinical Course User Index [WS] Francesca Elsie CROME, MD     Additional history obtained: -Additional history obtained from family and ems -External records from outside source obtained and reviewed including: Chart review including previous notes, labs, imaging,  consultation  notes including prior notes    Lab Tests: -I ordered, reviewed, and interpreted labs.   The pertinent results include:   Labs Reviewed  COMPREHENSIVE METABOLIC PANEL WITH GFR - Abnormal; Notable for the following components:      Result Value   Sodium 129 (*)    Chloride 96 (*)    CO2 20 (*)    Calcium 8.8 (*)    All other components within normal limits  CBC - Abnormal; Notable for the following components:   MCV 100.9 (*)    All other components within normal limits  ETHANOL - Abnormal; Notable for the following components:   Alcohol , Ethyl (B) 236 (*)    All other components within normal limits    Notable for mild hyponatremia, elevated alcohol  level   EKG   EKG Interpretation Date/Time:    Ventricular Rate:    PR Interval:    QRS Duration:    QT Interval:    QTC Calculation:   R Axis:      Text Interpretation:           Imaging Studies ordered: I ordered imaging studies including CT head, CXR CT cervical spine, XR pelvis  On my interpretation imaging demonstrates no acute traumatic injury I independently visualized and interpreted imaging. I agree with the radiologist interpretation   Medicines ordered and prescription drug management: Meds ordered this encounter  Medications   Tdap (ADACEL) injection 0.5 mL   lidocaine  (PF) (XYLOCAINE ) 1 % injection 5 mL    -I have reviewed the patients home medicines and have made adjustments as needed  Reevaluation: After the interventions noted above, I reevaluated the patient and found that their symptoms have improved  Co morbidities that complicate the patient evaluation  Past Medical History:  Diagnosis Date   Adjustment disorder with mixed anxiety and depressed mood    Age-related osteoporosis without current pathological fracture 01/14/2017   07/2018 - spine  -2.3,  RFN  -1.9,  LFN  -2.3   - improvement in hips  dexa 07/25/17 - spine  -3.1,   RFN  -2.2,  LFN  -2.4  - started on prolia   DEXA 05/13/2010 in  [place] , osteopenia   Spine: -2.3   LFN  -1.7   RFN:  -1.8      FRAX  15%, 1.9%        Aortic atherosclerosis 01/20/2022   Noted on abdominal CT 12/2021     Arthritis    Body mass index (BMI) 31.0-31.9, adult    Confusional arousals 09/06/2022   Constipation 12/22/2021   Elevated LFTs 07/15/2022   Fatigue    Frequent falls    hx falls on 01/2024, 07/2023, 01/2023, 02/2022   GERD (gastroesophageal reflux disease) 01/13/2017   Hemangioma 04/18/2017   History of breast augmentation    History of colon polyps 01/14/2017   Last colonoscopy 2018 - no polyps - advised 5 years   History of gastric bypass    History of pelvis fracture    in 30's   History of tuberculosis exposure 01/14/2017   Hx of latent TB, treated for 9 months   Hyperglycemia 09/28/2020   Hypertension    Hyponatremia 07/15/2022   Hypothyroidism 01/14/2017   Laceration of right hand without foreign body 07/24/2023   Melanocytic nevi, unspecified 04/18/2017   Menopause 12/25/2019   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   Osteoporosis 01/14/2017   07/2018 - spine  -2.3,  RFN  -1.9,  LFN  -2.3   -  improvement in hips dexa 07/25/17 - spine  -3.1,   RFN  -2.2,  LFN  -2.4  - started on prolia  DEXA 05/13/2010 in [place] , osteopenia   Spine: -2.3   LFN  -1.7   RFN:  -1.8     FRAX  15%, 1.9%    Paresthesia 07/17/2018   Paroxysmal atrial fibrillation 09/29/2020   PFO with atrial septal aneurysm 06/19/2020   repaired in 05/27/2020   Plant dermatitis 01/08/2020   PND (post-nasal drip) 11/08/2022   Pneumonia    x 2   Positive QuantiFERON-TB Gold test    Purpura 04/18/2017   Rheumatoid arthritis 01/13/2017   Following at [facility] rheumatology  Has been on Prednisone , Plaquenil, Arava, Humira, etolodac, Enbrel, Orencia, Remicade and Rituxan      Sebaceous cyst 04/18/2017   Spells of decreased attentiveness 09/06/2022   Steatorrhea 11/08/2022   Stroke (HCC) 04/22/2020   Subjective memory complaints 07/24/2023   TIA (transient  ischemic attack) 04/22/2020   Ventral hernia without obstruction or gangrene 11/01/2019   Noted on Ct scan 10/2019   Vitamin D  deficiency 06/22/2017      Dispostion: Disposition decision including need for hospitalization was considered, and patient disposition pending at time of sign out    Final Clinical Impression(s) / ED Diagnoses Final diagnoses:  Minor head injury, initial encounter  Alcoholic intoxication without complication     This chart was dictated using voice recognition software.  Despite best efforts to proofread,  errors can occur which can change the documentation meaning.    Francesca Elsie CROME, MD 05/22/24 2312

## 2024-05-22 NOTE — ED Provider Notes (Signed)
 Care assumed at 2300.  Patient on Eliquis  for atrial fibrillation here after a fall.  She has a scalp laceration that was repaired per prior provider.  Care assumed pending metabolization of alcohol .  On repeat assessment patient is able to ambulate with a steady gait.  Brother will pick the patient up.  Feel she is stable for discharge home with outpatient follow-up.  Discussed wound care and need for PCP follow-up for recheck.   Griselda Norris, MD 05/23/24 [phone]

## 2024-05-23 DIAGNOSIS — S0101XA Laceration without foreign body of scalp, initial encounter: Secondary | ICD-10-CM | POA: Diagnosis not present

## 2024-05-23 NOTE — ED Notes (Signed)
 Patient up and ambulating without assistance.

## 2024-05-23 NOTE — Progress Notes (Signed)
   05/22/24 2200  Spiritual Encounters  Reason for visit Trauma  OnCall Visit Yes  Spiritual Framework  Presenting Themes Impactful experiences and emotions  Community/Connection Family  Patient Stress Factors Health changes  Family Stress Factors Exhausted  Interventions  Spiritual Care Interventions Made Compassionate presence;Established relationship of care and support;Prayer;Normalization of emotions  Intervention Outcomes  Outcomes Awareness of support;Awareness of health   Chaplain responded to the ED page and met with the Pt, who sister was present at the bedside. The sister requested prayer. The Pt had fallen due to intoxication.Chaplain offered a word of prayer as the sister became tearful., providing emotional and spiritual support to both the Pt and the family.

## 2024-05-23 NOTE — ED Notes (Signed)
 Pt. Tolerated ambulating, no complaints.

## 2024-05-27 DIAGNOSIS — M818 Other osteoporosis without current pathological fracture: Secondary | ICD-10-CM | POA: Diagnosis not present

## 2024-05-27 DIAGNOSIS — Z5181 Encounter for therapeutic drug level monitoring: Secondary | ICD-10-CM | POA: Diagnosis not present

## 2024-05-27 DIAGNOSIS — Z7960 Long term (current) use of unspecified immunomodulators and immunosuppressants: Secondary | ICD-10-CM | POA: Diagnosis not present

## 2024-05-27 DIAGNOSIS — M0609 Rheumatoid arthritis without rheumatoid factor, multiple sites: Secondary | ICD-10-CM | POA: Diagnosis not present

## 2024-05-29 ENCOUNTER — Encounter: Payer: Self-pay | Admitting: Internal Medicine

## 2024-05-30 ENCOUNTER — Other Ambulatory Visit: Payer: Self-pay

## 2024-05-30 ENCOUNTER — Emergency Department (HOSPITAL_COMMUNITY)

## 2024-05-30 ENCOUNTER — Emergency Department (HOSPITAL_COMMUNITY)
Admission: EM | Admit: 2024-05-30 | Discharge: 2024-05-30 | Discharge status: Against Medical Advice | Source: Ambulatory Visit | Attending: Emergency Medicine | Admitting: Emergency Medicine

## 2024-05-30 ENCOUNTER — Ambulatory Visit: Payer: Self-pay

## 2024-05-30 DIAGNOSIS — Z5321 Procedure and treatment not carried out due to patient leaving prior to being seen by health care provider: Secondary | ICD-10-CM | POA: Insufficient documentation

## 2024-05-30 DIAGNOSIS — R42 Dizziness and giddiness: Secondary | ICD-10-CM | POA: Diagnosis not present

## 2024-05-30 DIAGNOSIS — H9313 Tinnitus, bilateral: Secondary | ICD-10-CM | POA: Insufficient documentation

## 2024-05-30 DIAGNOSIS — S0003XA Contusion of scalp, initial encounter: Secondary | ICD-10-CM | POA: Diagnosis not present

## 2024-05-30 LAB — CBC
HCT: 40.3 % (ref 36.0–46.0)
Hemoglobin: 12.9 g/dL (ref 12.0–15.0)
MCH: 31.2 pg (ref 26.0–34.0)
MCHC: 32 g/dL (ref 30.0–36.0)
MCV: 97.3 fL (ref 80.0–100.0)
Platelets: 370 K/uL (ref 150–400)
RBC: 4.14 MIL/uL (ref 3.87–5.11)
RDW: 13.8 % (ref 11.5–15.5)
WBC: 6.2 K/uL (ref 4.0–10.5)
nRBC: 0 % (ref 0.0–0.2)

## 2024-05-30 LAB — URINALYSIS, ROUTINE W REFLEX MICROSCOPIC
Bilirubin Urine: NEGATIVE
Glucose, UA: NEGATIVE mg/dL
Hgb urine dipstick: NEGATIVE
Ketones, ur: NEGATIVE mg/dL
Leukocytes,Ua: NEGATIVE
Nitrite: NEGATIVE
Protein, ur: NEGATIVE mg/dL
Specific Gravity, Urine: 1.02 (ref 1.005–1.030)
pH: 5 (ref 5.0–8.0)

## 2024-05-30 LAB — COMPREHENSIVE METABOLIC PANEL WITH GFR
ALT: 24 U/L (ref 0–44)
AST: 31 U/L (ref 15–41)
Albumin: 4 g/dL (ref 3.5–5.0)
Alkaline Phosphatase: 67 U/L (ref 38–126)
Anion gap: 12 (ref 5–15)
BUN: 11 mg/dL (ref 8–23)
CO2: 25 mmol/L (ref 22–32)
Calcium: 8.9 mg/dL (ref 8.9–10.3)
Chloride: 99 mmol/L (ref 98–111)
Creatinine, Ser: 0.5 mg/dL (ref 0.44–1.00)
GFR, Estimated: >60 mL/min (ref >60)
Glucose, Bld: 79 mg/dL (ref 70–99)
Potassium: 3.7 mmol/L (ref 3.5–5.1)
Sodium: 136 mmol/L (ref 135–145)
Total Bilirubin: 0.7 mg/dL (ref 0.0–1.2)
Total Protein: 6.3 g/dL — ABNORMAL LOW (ref 6.5–8.1)

## 2024-05-30 LAB — CBG MONITORING, ED: Glucose-Capillary: 75 mg/dL (ref 70–99)

## 2024-05-30 NOTE — ED Triage Notes (Signed)
 Patient was here last week for a fall while intoxicated.  Patient was dx with minor head injury no bleeding on the brain but reports dizziness has increased and worsened since then.  PCP sent for further evaluation.

## 2024-05-30 NOTE — Telephone Encounter (Signed)
 FYI Only or Action Required?: FYI only for provider: ED advised.  Patient was last seen in primary care on 02/01/2024 by Geofm Glade PARAS, MD.  Called Nurse Triage reporting Dizziness.  Symptoms began a week ago.  Interventions attempted: Rest, hydration, or home remedies.  Symptoms are: gradually worsening.  Triage Disposition: See HCP Within 4 Hours (Or PCP Triage)  Patient/caregiver understands and will follow disposition?: Yes, will follow disposition  Copied from CRM #[id number]. Topic: Clinical - Red Word Triage >> May 30, 2024  9:32 AM Rea ORN wrote: Red Word that prompted transfer to Nurse Triage: Dizziness. Please see pt mychart message Reason for Disposition  [1] Dizziness caused by heat exposure, sudden standing, or poor fluid intake AND [2] no improvement after 2 hours of rest and fluids  Answer Assessment - Initial Assessment Questions 1. DESCRIPTION: Describe your dizziness.     Pt states that room spins during change in head or body position 3. VERTIGO: Do you feel like either you or the room is spinning or tilting? (i.e., vertigo)     Denies hx of vertigo 4. SEVERITY: How bad is it?  Do you feel like you are going to faint? Can you stand and walk?     Can stand and walk, states that it has not impacted her ability to drive 5. ONSET:  When did the dizziness begin?     Weeks ago, worse over the last week 6. AGGRAVATING FACTORS: Does anything make it worse? (e.g., standing, change in head position)     Moving head or change in body position 10. OTHER SYMPTOMS: Do you have any other symptoms? (e.g., fever, chest pain, vomiting, diarrhea, bleeding)       Ringing in ears,   Pt states that she has had a few falls recently. One recently hit her head. Pt is on blood thinners, reports most recent head injury resulted in staples in her head. Pt states that was about a week ago, pt also states that her dizziness has been worsening over the last week. Pt states  dizziness occurs sporadically. Pt states that she is walking without assistance.  Pt states that it happens when looking one direction of the other, or often when changing positions. Pt states that she has had vomiting with some dizziness episodes. Pt was advised to go to ED. Pt would also like to know next steps for the ringing in her ears.  Protocols used: Dizziness - Lightheadedness-A-AH

## 2024-05-30 NOTE — ED Notes (Signed)
 PT leaving due to not being able to drive at night time.

## 2024-05-30 NOTE — ED Triage Notes (Signed)
 Patient reports that she hears buzzing/ringing in her ears during her dizzy episodes.

## 2024-06-02 ENCOUNTER — Encounter: Payer: Self-pay | Admitting: Internal Medicine

## 2024-06-02 NOTE — Progress Notes (Unsigned)
 Subjective:    Patient ID: Julia [name], female    DOB: 04/09/1951, 73 y.o.   MRN: [id number]     HPI Julia [name] is here for follow up from the hospital  11/12 ED after a fall.  She was at [employer] and had numerous alcoholic drinks.  She got up and fell.  No LOC.  She did hit her posterior head.  She had no complaints - no headache.    In ED - hyponatremic, elevated ethanol level.  Cxr, pelvic xray, CT head, CT c-spine neg for acute injury.  Had laceration repaired with 4 staples.    11/20:  ED - dizziness. Cbc, cmp, ua, ct head unremarkable.  She left AMA - did not want to drive at night.  EKG w/ atrial flutter.   EKG  #2 NSR.  EKG #3 Atrial flutter  Had 2 glasses of wine and a beer previous to the whole night.  She did not feel drunk and was not acting drunk.  She was not dizzy, lightheaded.  When she left where she was she tripped-she felt like her foot got caught on the ground and she ended up falling backwards.  She did end up developing some dizziness and ended up in the emergency room, but was not able to wait there.  The dizziness is better.  She is concerned that some of her medications may be affecting her ability to tolerate alcohol .  She also notes she did not eat that much that day which is possibly part of the problem.  She wonders about seeing a pharmacist to see if she needs to revise her medication or how she is taking her medication.    Medications and allergies reviewed with patient and updated if appropriate.  Current Outpatient Medications on File Prior to Visit  Medication Sig Dispense Refill   amLODipine  (NORVASC ) 2.5 MG tablet TAKE 1 TABLET(2.5 MG) BY MOUTH DAILY 90 tablet 3   Ascorbic Acid (VITAMIN C PO) Take 3,200 mg by mouth daily.     Cholecalciferol (VITAMIN D3) 250 MCG (10000 UT) TABS Take 5,000 Units by mouth 2 (two) times daily.      Coenzyme Q10 (COQ-10) 100 MG CAPS Take 200 mg by mouth daily.     ELIQUIS  5 MG TABS tablet TAKE 1 TABLET(5 MG) BY  MOUTH TWICE DAILY 180 tablet 1   Krill Oil 1000 MG CAPS Take 1,000 mg by mouth daily.     levothyroxine  (SYNTHROID ) 25 MCG tablet TAKE 1 TABLET(25 MCG) BY MOUTH DAILY 90 tablet 3   milk thistle 175 MG tablet Take 175 mg by mouth daily.     Misc Natural Products (CVS GLUCOS-CHONDROIT-MSM TS) TABS Take 2 tablets by mouth at bedtime.     Multiple Vitamins-Minerals (PRESERVISION AREDS 2+MULTI VIT PO) Take 1 capsule by mouth in the morning and at bedtime.     riTUXimab  (RITUXAN ) 500 MG/50ML injection Inject 10 mg into the vein every 6 (six) months.     triamcinolone  ointment (KENALOG ) 0.5 % Apply topically 2 (two) times daily. APPLY TOPICALLY TO THE AFFECTED AREA TWICE DAILY (Patient taking differently: Apply 1 Application topically 2 (two) times daily as needed (rash).) 30 g 2   valACYclovir  (VALTREX ) 500 MG tablet TAKE 1 TABLET(500 MG) BY MOUTH DAILY 90 tablet 1   venlafaxine  XR (EFFEXOR -XR) 150 MG 24 hr capsule TAKE 1 CAPSULE(150 MG) BY MOUTH DAILY WITH BREAKFAST 90 capsule 3   Current Facility-Administered Medications on File Prior to Visit  Medication Dose Route Frequency Provider Last Rate Last Admin   sodium chloride  flush (NS) 0.9 % injection 10 mL  10 mL Intravenous PRN Sebastian Collar R, PA-C   20 mL at 07/16/21 1025     Review of Systems  Eyes:  Negative for visual disturbance.  Gastrointestinal:  Positive for nausea (mild).  Neurological:  Positive for dizziness (improved). Negative for weakness, light-headedness, numbness and headaches.       Objective:   Vitals:   06/03/24 1131  BP: 118/72  Pulse: 83  Temp: 98.3 F (36.8 C)  SpO2: 99%   BP Readings from Last 3 Encounters:  06/03/24 118/72  05/30/24 128/76  05/23/24 101/70   Wt Readings from Last 3 Encounters:  06/03/24 123 lb (55.8 kg)  05/30/24 120 lb (54.4 kg)  05/22/24 117 lb (53.1 kg)   Body mass index is 21.79 kg/m.    Physical Exam Constitutional:      General: She is not in acute distress.     Appearance: Normal appearance.  HENT:     Head: Normocephalic.     Comments: 4 staples posterior head-laceration is healed with slight scabbing.  No surrounding erythema, swelling and no tenderness.  Staples removed Eyes:     Conjunctiva/sclera: Conjunctivae normal.  Musculoskeletal:     Cervical back: Neck supple.     Right lower leg: No edema.     Left lower leg: No edema.  Lymphadenopathy:     Cervical: No cervical adenopathy.  Skin:    General: Skin is warm and dry.     Findings: No rash.  Neurological:     General: No focal deficit present.     Mental Status: She is alert. Mental status is at baseline.  Psychiatric:        Mood and Affect: Mood normal.        Behavior: Behavior normal.      Suture Removal  Date/Time: 06/03/2024 4:31 PM  Performed by: Geofm Glade PARAS, MD Authorized by: Geofm Glade PARAS, MD  Body area: head/neck Location details: scalp Wound Appearance: clean Staples Removed: 4 Patient tolerance: patient tolerated the procedure well with no immediate complications       Lab Results  Component Value Date   WBC 6.2 05/30/2024   HGB 12.9 05/30/2024   HCT 40.3 05/30/2024   PLT 370 05/30/2024   GLUCOSE 79 05/30/2024   CHOL 201 (H) 01/09/2023   TRIG 56.0 01/09/2023   HDL 117.80 01/09/2023   LDLCALC 72 01/09/2023   ALT 24 05/30/2024   AST 31 05/30/2024   NA 136 05/30/2024   K 3.7 05/30/2024   CL 99 05/30/2024   CREATININE 0.50 05/30/2024   BUN 11 05/30/2024   CO2 25 05/30/2024   TSH 1.33 02/01/2024   INR 1.0 02/14/2020   HGBA1C 4.9 01/09/2023   CT Head Wo Contrast EXAM: CT HEAD WITHOUT CONTRAST 05/30/2024 02:58:00 PM  TECHNIQUE: CT of the head was performed without the administration of intravenous contrast. Automated exposure control, iterative reconstruction, and/or weight based adjustment of the mA/kV was utilized to reduce the radiation dose to as low as reasonably achievable.  COMPARISON: Head CT 05/22/2024 and MRI  07/07/2022.  CLINICAL HISTORY: Vertigo, central.  FINDINGS:  BRAIN AND VENTRICLES: There is no evidence of an acute infarct, intracranial hemorrhage, mass, midline shift, hydrocephalus, or extra-axial fluid collection. There is cerebral atrophy. Cerebral white matter hypodensities are nonspecific but compatible with minimal chronic small vessel ischemic disease. Calcified atherosclerosis.  ORBITS: No acute  abnormality.  SINUSES: No acute abnormality.  SOFT TISSUES AND SKULL: Small right parietal scalp hematoma with overlying skin staples. No skull fracture.  IMPRESSION: 1. No acute intracranial abnormality. 2. Small right parietal scalp hematoma.  Electronically signed by: Dasie Hamburg MD 05/30/2024 03:03 PM EST RP Workstation: [id number]    Assessment & Plan:    See Problem List for Assessment and Plan of chronic medical problems.

## 2024-06-03 ENCOUNTER — Inpatient Hospital Stay: Admitting: Internal Medicine

## 2024-06-03 ENCOUNTER — Ambulatory Visit: Admitting: Internal Medicine

## 2024-06-03 VITALS — BP 118/72 | HR 83 | Temp 98.3°F | Ht 63.0 in | Wt 123.0 lb

## 2024-06-03 DIAGNOSIS — F4323 Adjustment disorder with mixed anxiety and depressed mood: Secondary | ICD-10-CM | POA: Diagnosis not present

## 2024-06-03 DIAGNOSIS — I480 Paroxysmal atrial fibrillation: Secondary | ICD-10-CM | POA: Diagnosis not present

## 2024-06-03 DIAGNOSIS — I10 Essential (primary) hypertension: Secondary | ICD-10-CM | POA: Diagnosis not present

## 2024-06-03 DIAGNOSIS — R42 Dizziness and giddiness: Secondary | ICD-10-CM | POA: Diagnosis not present

## 2024-06-03 DIAGNOSIS — S0101XA Laceration without foreign body of scalp, initial encounter: Secondary | ICD-10-CM | POA: Insufficient documentation

## 2024-06-03 DIAGNOSIS — M810 Age-related osteoporosis without current pathological fracture: Secondary | ICD-10-CM

## 2024-06-03 DIAGNOSIS — E039 Hypothyroidism, unspecified: Secondary | ICD-10-CM

## 2024-06-03 DIAGNOSIS — Z79899 Other long term (current) drug therapy: Secondary | ICD-10-CM | POA: Diagnosis not present

## 2024-06-03 NOTE — Assessment & Plan Note (Signed)
 Acute Had some dizziness after her fall and she did go to the emergency room for evaluation, but was not able to stay to be seen.  Workup in the ED including CT head, UA, CBC, CMP were unremarkable EKG did show some atrial flutter on couple of occasions Dizziness has improved and she will continue to monitor

## 2024-06-03 NOTE — Assessment & Plan Note (Signed)
 Chronic Paroxysmal, recent EKG did suspect atrial flutter-can follow-up with cardiology Following with cardiology On Eliquis  5 mg twice daily Rate controlled

## 2024-06-03 NOTE — Assessment & Plan Note (Signed)
 Chronic Overall controlled She does have some worry/anxiety at times Discussed possibly decreasing Effexor -discussed that may not mix well with alcohol -May increase possible side effects from medication She will decrease the 75 mg dose to every other day for couple weeks and see how she does and then consider stopping it

## 2024-06-03 NOTE — Patient Instructions (Addendum)
    Your staples were removed    Medications changes include :   start taking effexor  75 mg every other day for a week and then slowly decrease.

## 2024-06-03 NOTE — Assessment & Plan Note (Signed)
 Chronic Blood pressure is well-controlled No lightheadedness Monitor BP at home Continue amlodipine  2.5 mg daily

## 2024-06-03 NOTE — Assessment & Plan Note (Signed)
 Acute Results of fall 2011/12 She had tripped over her foot got caught when she was walking and fell backwards and hit her posterior head She was evaluated in the ED because she is on Eliquis -CT scan without acute bleed She had a laceration that was repaired with 4 staples which were removed today Laceration is healed well and there is no evidence of infection

## 2024-06-03 NOTE — Assessment & Plan Note (Signed)
 She is concerned about the medications that she is taking and if there is any interaction, especially with alcohol  which she does drink on occasion Will refer to pharmacy to review her medications with her

## 2024-06-04 ENCOUNTER — Telehealth: Payer: Self-pay | Admitting: *Deleted

## 2024-06-04 NOTE — Progress Notes (Signed)
 Care Guide Pharmacy Note  06/04/2024 Name: Julia [name] MRN: [id number] DOB: 04-11-1951  Referred By: Geofm Glade PARAS, MD Reason for referral: Call Attempt #1 and Complex Care Management (Outreach to schedule referral with pharmacist )   Julia [name] is a 73 y.o. year old female who is a primary care patient of Geofm Glade PARAS, MD.  Julia [name] was referred to the pharmacist for assistance related to: polypharmacy  Successful contact was made with the patient to discuss pharmacy services including being ready for the pharmacist to call at least 5 minutes before the scheduled appointment time and to have medication bottles and any blood pressure readings ready for review. The patient agreed to meet with the pharmacist via telephone visit on 06/13/2024  Thedford Franks, CMA [place]  [facility] Guide Direct Dial: [phone]  Fax: [phone] Website: [email or web address]

## 2024-06-10 ENCOUNTER — Ambulatory Visit

## 2024-06-10 ENCOUNTER — Telehealth: Payer: Self-pay

## 2024-06-10 DIAGNOSIS — M810 Age-related osteoporosis without current pathological fracture: Secondary | ICD-10-CM

## 2024-06-10 MED ORDER — ROMOSOZUMAB-AQQG 105 MG/1.17ML ~~LOC~~ SOSY
210.0000 mg | PREFILLED_SYRINGE | Freq: Once | SUBCUTANEOUS | Status: AC
  Administered 2024-06-19: 210 mg via SUBCUTANEOUS

## 2024-06-10 NOTE — Telephone Encounter (Signed)
 Patient rescheduled for her Evenity  in Dec. On 04/08/24 but during 10/30 visit orders were missed to start PA for next injection.  Orders placed not for injection this week, will followup with patient once ready to schedule.

## 2024-06-10 NOTE — Telephone Encounter (Signed)
 Pt ready for scheduling for EVENITY  on or after : 06/10/24   Option# 1 Buy/Bill (Office supplied medication)   Out-of-pocket cost due at time of  office visit: $0   Number of injection/visits approved: ---   Primary: MEDICARE Evenity  co-insurance: 0% Admin fee co-insurance: 0%   Secondary: UHC-AARP MEDSUP Evenity  co-insurance:  Admin fee co-insurance:    Medical Benefit Details: Date Benefits were checked: 06/10/24 Deductible: $257 Met of $257 Required/ Coinsurance: 0%/ Admin Fee: 0%   Prior Auth: N/A PA# Expiration Date:   # of doses approved: ------------------------------------------------------------------------- Option# 2- Med Obtained from pharmacy   Pharmacy benefit: Copay $--- (Paid to pharmacy) Admin Fee: --- (Pay at clinic)   Prior Auth: --- PA# Expiration Date:   # of doses approved:   If patient wants fill through the pharmacy benefit please send prescription to: ---, and include estimated need by date in rx notes. Pharmacy will ship medication directly to the office.   Patient NOT eligible for Evenity  Copay Card. Copay Card can make patient's cost as little as $25. Link to apply: [email or web address]://[email or web address]    This summary of benefits is an estimation of the patient's out-of-pocket cost. Exact cost may very based on individual plan coverage.

## 2024-06-10 NOTE — Telephone Encounter (Signed)
 Prolia  BIV in separate encounter. Referral completed.

## 2024-06-10 NOTE — Telephone Encounter (Signed)
 Evenity  VOB initiated via [email or web address]  Last OV:  Next OV:  Last Evenity  inj:  Next Evenity  inj DUE: NOW

## 2024-06-13 ENCOUNTER — Encounter: Payer: Self-pay | Admitting: Internal Medicine

## 2024-06-13 ENCOUNTER — Other Ambulatory Visit: Admitting: Pharmacist

## 2024-06-13 DIAGNOSIS — Z79899 Other long term (current) drug therapy: Secondary | ICD-10-CM

## 2024-06-13 NOTE — Progress Notes (Signed)
 06/13/2024 Name: Julia [name] MRN: [id number] DOB: 1950-12-22  Chief Complaint  Patient presents with   Medication Assistance    Julia [name] is a 73 y.o. year old female who presented for a telephone visit.   They were referred to the pharmacist by their PCP for assistance in managing medication review.    Subjective:  Care Team: Primary Care Provider: Geofm Julia PARAS, MD ; Next Scheduled Visit: none scheduled   Medication Access/Adherence  Current Pharmacy:  [id number] [place], [place] - [address] AT [facility] [address] [place] [address] [facility] [address] Phone: [phone] Fax: [phone]   Patient reports affordability concerns with their medications: No  Patient reports access/transportation concerns to their pharmacy: No  Patient reports adherence concerns with their medications:  No    Completed thorough review of medication list and updated in chart. Patient takes several medications/supplements and would like to know if there are any that she does not need or if there are any interactions with her current regimen.     Objective:  Lab Results  Component Value Date   HGBA1C 4.9 01/09/2023    Lab Results  Component Value Date   CREATININE 0.50 05/30/2024   BUN 11 05/30/2024   NA 136 05/30/2024   K 3.7 05/30/2024   CL 99 05/30/2024   CO2 25 05/30/2024    Lab Results  Component Value Date   CHOL 201 (H) 01/09/2023   HDL 117.80 01/09/2023   LDLCALC 72 01/09/2023   TRIG 56.0 01/09/2023   CHOLHDL 2 01/09/2023    Medications Reviewed Today     Reviewed by Merceda Lela SAUNDERS, RPH (Pharmacist) on 06/13/24 at 1637  Med List Status: <None>   Medication Order Taking? Sig Documenting Provider Last Dose Status Informant  amLODipine  (NORVASC ) 2.5 MG tablet [id number] Yes TAKE 1 TABLET(2.5 MG) BY MOUTH DAILY Burns, Julia PARAS, MD  Active   Ascorbic Acid (VITAMIN C PO) [id number] Yes Take 1,450 mg by mouth daily. Liposomal vitamin c  [provider]  Active Self           Med Note JACKOLYN APOLINAR SAUNDERS   Fri Mar 10, 2023  1:51 PM)    B Complex Vitamins (VITAMIN B COMPLEX W/B-12) TABS [id number] Yes Take 1 tablet by mouth daily. [provider]  Active    Discontinued 06/13/24 0944   BERBERINE CHLORIDE PO [id number] Yes Take 1 tablet by mouth daily at 6 (six) AM. [provider]  Active   Bromelains 500 MG TABS [id number] Yes Take 1 tablet by mouth daily. [provider]  Active   Calcium Carbonate-Vitamin D  (CALCIUM 600 +D HIGH POTENCY) 600-10 MG-MCG TABS [id number] Yes Take 1 tablet by mouth in the morning and at bedtime. [provider]  Active   Cholecalciferol (VITAMIN D3) 125 MCG (5000 UT) CAPS [id number] Yes Take 5,000 Units by mouth daily. [provider]  Active Self  Cobalamin Combinations (NEURIVA PLUS PO) [id number] Yes Take 1 tablet by mouth daily. [provider]  Active   Coenzyme Q10 (COQ-10) 200 MG CAPS [id number] Yes Take 200 mg by mouth daily. [provider]  Active Self  ELIQUIS  5 MG TABS tablet [id number] Yes TAKE 1 TABLET(5 MG) BY MOUTH TWICE DAILY Wonda Sharper, MD  Active Self    Discontinued 06/13/24 0931 KRILL OIL OMEGA-3 PO [id number] Yes Take 2,000 mg by mouth daily. [provider]  Active   levothyroxine  (SYNTHROID ) 25 MCG tablet  [id number] Yes TAKE 1 TABLET(25 MCG) BY MOUTH DAILY Geofm Julia PARAS, MD  Active Self  Magnesium  400 MG CAPS [id number] Yes Take 1 capsule by mouth daily. [provider]  Active   Menaquinone-7 (VITAMIN K2) 100 MCG CAPS [id number] Yes Take 1 capsule by mouth daily. [provider]  Active     Discontinued 06/13/24 0941 Misc Natural Products (CVS GLUCOS-CHONDROIT-MSM TS) TABS [id number] Yes Take 1 tablet by mouth in the morning and at bedtime. 1110 mg-1200 mg-300 mg [provider]  Active Self  Multiple Vitamins-Minerals (PRESERVISION AREDS 2+MULTI VIT PO) [id number]  Take 1 capsule  by mouth in the morning and at bedtime. [provider]  Active Self  orlistat  (ALLI ) 60 MG capsule [id number] Yes Take 60 mg by mouth 3 (three) times daily with meals. [provider]  Active   riTUXimab  (RITUXAN ) 500 MG/50ML injection [id number] Yes Inject 10 mg into the vein every 6 (six) months. [provider]  Active Self  Romosozumab -aqqg (EVENITY ) 105 MG/1. injection 210 mg [id number]   Geofm Julia PARAS, MD  Active   sodium chloride  flush (NS) 0.9 % injection 10 mL [id number]   Sebastian Lamarr SAUNDERS, PA-C  Active   triamcinolone  ointment (KENALOG ) 0.5 % [id number] Yes Apply topically 2 (two) times daily. APPLY TOPICALLY TO THE AFFECTED AREA TWICE DAILY  Patient taking differently: Apply 1 Application topically 2 (two) times daily as needed (rash).   Geofm Julia PARAS, MD  Active Self  valACYclovir  (VALTREX ) 500 MG tablet [id number] Yes TAKE 1 TABLET(500 MG) BY MOUTH DAILY Burns, Julia PARAS, MD  Active Self  venlafaxine  XR (EFFEXOR -XR) 150 MG 24 hr capsule [id number] Yes TAKE 1 CAPSULE(150 MG) BY MOUTH DAILY WITH BREAKFAST Burns, Julia PARAS, MD  Active   venlafaxine  XR (EFFEXOR -XR) 75 MG 24 hr capsule [id number] Yes Take 75 mg by mouth every other day. [provider]  Active               Assessment/Plan:   Did not have time to discuss finding of drug interactions or recommendations for medications/supplements. Have scheduled appt for 12/8 to discuss further.  To review at follow up appointment: Interaction check using [employer]: Berberine interacts with Eliquis  (increased bleed risk), venlafaxine , amlodipine  (increased drug level) Calcium and magnesium  need to be separated from levothyorxine Krill oil may increase bleeding risk with Eliquis   Does not need: Vitamin C, Vitamin B, Neuriva, coenzyme q10, magnesium , can just get a multivitamin instead Recommend reducing Vitamin D  total daily dose  Bromelain has insufficient evidence to rate effectivenss    Follow Up Plan: 12/8  Darrelyn Drum, PharmD, BCPS, CPP Clinical Pharmacist Practitioner [facility] [phone]

## 2024-06-17 ENCOUNTER — Other Ambulatory Visit

## 2024-06-17 ENCOUNTER — Telehealth: Payer: Self-pay

## 2024-06-17 DIAGNOSIS — Z79899 Other long term (current) drug therapy: Secondary | ICD-10-CM

## 2024-06-17 MED ORDER — MULTIVITAMIN WOMEN 50+ PO TABS: ORAL_TABLET | ORAL | Status: AC

## 2024-06-17 NOTE — Telephone Encounter (Signed)
 Copied from CRM [id number]. Topic: Clinical - Medical Advice >> Jun 17, 2024  1:13 PM Paige D wrote: Reason for CRM: Pt is calling in regards to her  Edenity injection she states office was suppose to be reaching out to her and still has not heard from anyone in regards to this injection. Please reach out to pt to further discuss

## 2024-06-17 NOTE — Telephone Encounter (Signed)
 Appointment scheduled.

## 2024-06-17 NOTE — Patient Instructions (Signed)
 It was a pleasure speaking with you today!  START multivitamin (NatureMade For Her 50+ Multi)  STOP: berberine, krill oil, vitamin C, b complex, coQ10, magnesium , vitamin D , bromelain  -make sure to separate calcium supplement from levothyroxine  by at least 4 hours  CONTINUE: amlodipine , Calcium+D, Neuriva, Eliquis , levothyroxine , vitamin K2, Glucosamine-chondroitin, AREDS2, orlistat , Rituxan , Evenity , valacyclovir , and venlafaxine   Feel free to call with any questions or concerns!  Darrelyn Drum, PharmD, BCPS, CPP Clinical Pharmacist Practitioner [facility] [phone]

## 2024-06-17 NOTE — Progress Notes (Signed)
 06/17/2024 Name: Teagan Ellerson MRN: [id number] DOB: 16-Jun-1951  Chief Complaint  Patient presents with   Medication Management    Julia [name] is a 73 y.o. year old female who presented for a telephone visit.   They were referred to the pharmacist by their PCP for assistance in managing medication review.    Subjective:  Care Team: Primary Care Provider: Geofm Glade PARAS, MD ; Next Scheduled Visit: none scheduled   Medication Access/Adherence  Current Pharmacy:  [id number] [place], [place] - [address] AT [facility] [address] [facility] [address] Phone: [phone] Fax: [phone]   Patient reports affordability concerns with their medications: No  Patient reports access/transportation concerns to their pharmacy: No  Patient reports adherence concerns with their medications:  No    Completed thorough review of medication list and updated in chart. Patient takes several medications/supplements and would like to know if there are any that she does not need or if there are any interactions with her current regimen.     Objective:  Lab Results  Component Value Date   HGBA1C 4.9 01/09/2023    Lab Results  Component Value Date   CREATININE 0.50 05/30/2024   BUN 11 05/30/2024   NA 136 05/30/2024   K 3.7 05/30/2024   CL 99 05/30/2024   CO2 25 05/30/2024    Lab Results  Component Value Date   CHOL 201 (H) 01/09/2023   HDL 117.80 01/09/2023   LDLCALC 72 01/09/2023   TRIG 56.0 01/09/2023   CHOLHDL 2 01/09/2023    Medications Reviewed Today     Reviewed by Merceda Lela SAUNDERS, RPH (Pharmacist) on 06/17/24 at 1542  Med List Status: <None>   Medication Order Taking? Sig Documenting Provider Last Dose Status Informant  amLODipine  (NORVASC ) 2.5 MG tablet [id number]  TAKE 1 TABLET(2.5 MG) BY MOUTH DAILY Geofm Glade PARAS, MD  Active     Discontinued 06/17/24 1524          Med Note JACKOLYN APOLINAR SAUNDERS   Fri Mar 10, 2023  1:51 PM)       Discontinued 06/17/24 1522     Discontinued 06/17/24 1522     Discontinued 06/17/24 1523   Calcium Carbonate-Vitamin D  (CALCIUM 600 +D HIGH POTENCY) 600-10 MG-MCG TABS [id number]  Take 1 tablet by mouth in the morning and at bedtime. [provider]  Active     Discontinued 06/17/24 1524 Cobalamin Combinations (NEURIVA PLUS PO) [id number]  Take 1 tablet by mouth daily. [provider]  Active     Discontinued 06/17/24 1524 ELIQUIS  5 MG TABS tablet [id number]  TAKE 1 TABLET(5 MG) BY MOUTH TWICE DAILY Wonda Sharper, MD  Active Self    Discontinued 06/17/24 1522   levothyroxine  (SYNTHROID ) 25 MCG tablet [id number]  TAKE 1 TABLET(25 MCG) BY MOUTH DAILY Geofm Glade PARAS, MD  Active Self    Discontinued 06/17/24 1522   Menaquinone-7 (VITAMIN K2) 100 MCG CAPS [id number]  Take 1 capsule by mouth daily. [provider]  Active   Misc Natural Products ([employer] GLUCOS-CHONDROIT-MSM TS) TABS [id number]  Take 1 tablet by mouth in the morning and at bedtime. 1110 mg-1200 mg-300 mg [provider]  Active Self  Multiple Vitamins-Minerals (MULTIVITAMIN WOMEN 50+) TABS [id number] Yes Take daily per instructions on the bottle. Geofm Glade PARAS, MD  Active   Multiple Vitamins-Minerals (PRESERVISION AREDS 2+MULTI VIT PO) [id number]  Take 1 capsule by mouth in the morning and at  bedtime. [provider]  Active Self  orlistat  (ALLI ) 60 MG capsule [id number]  Take 60 mg by mouth 3 (three) times daily with meals. [provider]  Active   riTUXimab  (RITUXAN ) 500 MG/50ML injection [id number]  Inject 10 mg into the vein every 6 (six) months. [provider]  Active Self  Romosozumab -aqqg (EVENITY ) 105 MG/1. injection 210 mg [id number]   Geofm Glade PARAS, MD  Active   sodium chloride  flush (NS) 0.9 % injection 10 mL [id number]   Sebastian Lamarr SAUNDERS, PA-C  Active   triamcinolone  ointment (KENALOG ) 0.5 % [id number]  Apply topically 2 (two) times daily. APPLY TOPICALLY TO THE  AFFECTED AREA TWICE DAILY  Patient taking differently: Apply 1 Application topically 2 (two) times daily as needed (rash).   Geofm Glade PARAS, MD  Active Self  valACYclovir  (VALTREX ) 500 MG tablet [id number]  TAKE 1 TABLET(500 MG) BY MOUTH DAILY Burns, Glade PARAS, MD  Active Self  venlafaxine  XR (EFFEXOR -XR) 150 MG 24 hr capsule [id number]  TAKE 1 CAPSULE(150 MG) BY MOUTH DAILY WITH BREAKFAST Burns, Glade PARAS, MD  Active   venlafaxine  XR (EFFEXOR -XR) 75 MG 24 hr capsule [id number]  Take 75 mg by mouth every other day. [provider]  Active               Assessment/Plan:   Discussed drug interactions or recommendations for medications/supplements.  To review at follow up appointment: Interaction check using NatMedPro: Berberine interacts with Eliquis  (increased bleed risk), venlafaxine , amlodipine  (increased drug level) Calcium and magnesium  need to be separated from levothyorxine Krill oil may increase bleeding risk with Eliquis   Does not need: Vitamin C, Vitamin B, coenzyme q10, magnesium , Recommend multivitamin instead (Nature Made For Her 50+) Recommend reducing Vitamin D  total daily dose  Bromelain has insufficient evidence to rate effectivenss - can discontinue  After discussion with patient: She will START multivitamin (NatureMade For Her 50+ Multi) STOP: berberine, krill oil, vitamin C, b complex, coQ10, magnesium , vitamin D , bromelain -She will make sure to separate calcium supplement from levothyroxine  by at least 4 hours CONTINUE: amlodipine , Calcium+D, Neuriva, Eliquis , levothyroxine , vitamin K2, Glucosamine-chondroitin, AREDS2, orlistat , Rituxan , Evenity , valacyclovir , and venlafaxine   Follow Up Plan: PRN  Darrelyn Drum, PharmD, BCPS, CPP Clinical Pharmacist Practitioner [facility] [phone]

## 2024-06-19 ENCOUNTER — Ambulatory Visit

## 2024-06-19 DIAGNOSIS — M810 Age-related osteoporosis without current pathological fracture: Secondary | ICD-10-CM

## 2024-06-19 DIAGNOSIS — R7612 Nonspecific reaction to cell mediated immunity measurement of gamma interferon antigen response without active tuberculosis: Secondary | ICD-10-CM | POA: Insufficient documentation

## 2024-06-19 DIAGNOSIS — K146 Glossodynia: Secondary | ICD-10-CM | POA: Insufficient documentation

## 2024-06-19 MED ORDER — ROMOSOZUMAB-AQQG 105 MG/1.17ML ~~LOC~~ SOSY
210.0000 mg | PREFILLED_SYRINGE | SUBCUTANEOUS | Status: AC
  Administered 2024-07-26: 210 mg via SUBCUTANEOUS

## 2024-06-19 NOTE — Progress Notes (Signed)
 Pt was given Evenity  injec w/o any complications at this time.

## 2024-06-20 ENCOUNTER — Encounter: Payer: Self-pay | Admitting: Internal Medicine

## 2024-07-02 ENCOUNTER — Other Ambulatory Visit: Payer: Self-pay | Admitting: Internal Medicine

## 2024-07-02 ENCOUNTER — Ambulatory Visit: Payer: Self-pay

## 2024-07-02 ENCOUNTER — Institutional Professional Consult (permissible substitution): Payer: Medicare Other | Admitting: Psychology

## 2024-07-09 ENCOUNTER — Encounter: Payer: Medicare Other | Admitting: Psychology

## 2024-07-15 ENCOUNTER — Telehealth: Payer: Self-pay

## 2024-07-15 NOTE — Telephone Encounter (Signed)
 Evenity  VOB initiated via [email or web address]   Last Evenity  inj: 06/19/24 Next Evenity  inj DUE: 07/20/24

## 2024-07-16 NOTE — Telephone Encounter (Signed)
 Pt ready for scheduling for EVENITY  on or after : 07/20/24  Option# 1 Buy/Bill (Office supplied medication)  Out-of-pocket cost due at time of  office visit: $0  Number of injection/visits approved: ---  Primary: MEDICARE Evenity  co-insurance: 0% Admin fee co-insurance: 0%  Secondary: UHC-MEDSUP Evenity  co-insurance: covers the Medicare Part B deductible, co-insurance and 100% of the excess charges Admin fee co-insurance:   Medical Benefit Details: Date Benefits were checked: 07/15/24 Deductible: $0 Met of $283 Required/ Coinsurance: 0%/ Admin Fee: 0%  Prior Auth: N/A PA# Expiration Date:   # of doses approved: ------------------------------------------------------------------------- Option# 2- Med Obtained from pharmacy  Pharmacy benefit: Copay $--- (Paid to pharmacy) Admin Fee: --- (Pay at clinic)  Prior Auth: --- PA# Expiration Date:   # of doses approved:  If patient wants fill through the pharmacy benefit please send prescription to: ---, and include estimated need by date in rx notes. Pharmacy will ship medication directly to the office.  Patient NOT eligible for Evenity  Copay Card. Copay Card can make patient's cost as little as $25. Link to apply: [email or web address]://[email or web address]   This summary of benefits is an estimation of the patient's out-of-pocket cost. Exact cost may very based on individual plan coverage.

## 2024-07-16 NOTE — Telephone Encounter (Signed)
 Julia [name]

## 2024-07-23 ENCOUNTER — Other Ambulatory Visit: Payer: Self-pay | Admitting: Medical Genetics

## 2024-07-25 ENCOUNTER — Encounter: Payer: Self-pay | Admitting: Internal Medicine

## 2024-07-25 NOTE — Progress Notes (Signed)
 "   Subjective:    Patient ID: Julia [name], female    DOB: Dec 03, 1950, 74 y.o.   MRN: [id number]      HPI Julia [name] is here for  Chief Complaint  Patient presents with   Memory Loss    Neuropsychology eval 09/2023-normal.  No evidence of memory issues.   Sometimes anxiety, depression - none that is persistent.  She is currently exercising regularly-works with a trainer twice a week.  At 1 point she was drinking more alcohol  than she should, but is not drinking alcohol  on a daily basis and when she does drink she will have a light beer or light wine.   Discussed the use of AI scribe software for clinical note transcription with the patient, who gave verbal consent to proceed.  History of Present Illness Julia [name] is a 74 year old female who presents with memory concerns and cognitive changes.  She experiences memory issues, including misplacing items like her phone and keys, asking repetitive questions, and forgetting recent events or newly learned information. She sometimes wakes up confused about the day or her plans and has moments of confusion about her late husband a couple of times a month. She also forgets names and places more frequently and occasionally loses track of her thoughts mid-sentence. Despite writing everything down to manage her daily activities, which she has done throughout her life, these memory issues have become more frequent.  She has vision issues, particularly difficulty reading text on the television. She reports that her ophthalmologist told her that her eyes were dry, and she uses moisturizing eye drops.  Her sleep is intermittent, with frequent awakenings five to six times a night, often to use the bathroom or drink water. She estimates getting about five to six hours of sleep per night, which she acknowledges is insufficient.  She has started exercising again with a trainer, engaging in sessions for an hour and a half on Mondays and Wednesdays, and reports  feeling better and more alive since resuming physical activity.  She experiences some anxiety and depression, particularly related to family dynamics and concerns about her future care and estate planning. She describes her family as dysfunctional, with limited support from her brother and sister.  She has reduced her alcohol  consumption, opting for light beer or wine with lower alcohol  content, and is conscious of her intake, especially in social settings.     Medications and allergies reviewed with patient and updated if appropriate.  Medications Ordered Prior to Encounter[1]  Review of Systems  Eyes:  Positive for visual disturbance (reading distance - advised dry eyes).  Psychiatric/Behavioral:  Positive for confusion (a couple of times a month), dysphoric mood (at times) and sleep disturbance (wakes up 5 times - gets back to sleep - 5-6 hrs of sleep). The patient is nervous/anxious (at times).        Objective:   Vitals:   07/26/24 1046  BP: 116/74  Pulse: 72  Temp: 98.3 F (36.8 C)  SpO2: 99%   BP Readings from Last 3 Encounters:  07/26/24 116/74  06/03/24 118/72  05/30/24 128/76   Wt Readings from Last 3 Encounters:  07/26/24 122 lb (55.3 kg)  06/03/24 123 lb (55.8 kg)  05/30/24 120 lb (54.4 kg)   Body mass index is 21.61 kg/m.    Physical Exam Constitutional:      General: She is not in acute distress.    Appearance: Normal appearance. She is not ill-appearing.  HENT:  Head: Normocephalic and atraumatic.  Skin:    General: Skin is warm and dry.  Neurological:     Mental Status: She is alert. Mental status is at baseline.  Psychiatric:        Mood and Affect: Mood normal.        Behavior: Behavior normal.        Thought Content: Thought content normal.        Judgment: Judgment normal.            Assessment & Plan:    See Problem List for Assessment and Plan of chronic medical problems.   Assessment and Plan Assessment & Plan Memory  impairment, subjective Increased frequency of memory issues. Previous testing reassuring. Interested in further evaluation for potential dementia or Alzheimer's. - Referred to [facility] for comprehensive memory assessment and potential clinical trials. - Encouraged continuation of exercise regimen to support cognitive function. - Encouraged increased amount of sleep-start monitoring amount and quality-she has an Apple watch - Continue to keep alcohol  to a minimum - Intermittent anxiety and depression, but nothing that is a concern and she will keep a check on this  Sleep difficulties Wakes 5-6 times per night, resulting in 5-6 hours of sleep. Aware of need to improve sleep quality and duration. - Advised tracking sleep patterns using Apple Watch. - Encouraged earlier bedtime to increase sleep time.        [1]  Current Outpatient Medications on File Prior to Visit  Medication Sig Dispense Refill   amLODipine  (NORVASC ) 2.5 MG tablet TAKE 1 TABLET(2.5 MG) BY MOUTH DAILY 90 tablet 3   Calcium Carbonate-Vitamin D  (CALCIUM 600 +D HIGH POTENCY) 600-10 MG-MCG TABS Take 1 tablet by mouth in the morning and at bedtime.     Cobalamin Combinations (NEURIVA PLUS PO) Take 1 tablet by mouth daily.     diphenoxylate -atropine  (LOMOTIL ) 2.5-0.025 MG tablet TAKE 1 TO 2 TABLETS BY MOUTH FOUR TIMES DAILY AS NEEDED FOR DIARRHEA OR LOOSE STOOLS     ELIQUIS  5 MG TABS tablet TAKE 1 TABLET(5 MG) BY MOUTH TWICE DAILY 180 tablet 1   esomeprazole  (NEXIUM ) 40 MG capsule 1 cap(s) orally once a day     levothyroxine  (SYNTHROID ) 25 MCG tablet TAKE 1 TABLET(25 MCG) BY MOUTH DAILY 90 tablet 3   Menaquinone-7 (VITAMIN K2) 100 MCG CAPS Take 1 capsule by mouth daily.     methocarbamol (ROBAXIN) 500 MG tablet TAKE 1 TABLET BY MOUTH EVERY 6 TO 8 HOURS FOR 10 DAYS AS NEEDED FOR SPASM     Misc Natural Products (CVS GLUCOS-CHONDROIT-MSM TS) TABS Take 1 tablet by mouth in the morning and at bedtime. 1110 mg-1200 mg-300 mg     Multiple  Vitamins-Minerals (MULTIVITAMIN WOMEN 50+) TABS Take daily per instructions on the bottle.     Multiple Vitamins-Minerals (PRESERVISION AREDS 2+MULTI VIT PO) Take 1 capsule by mouth in the morning and at bedtime.     Omega-3 Fatty Acids (FISH OIL) 1000 MG CAPS 1 cap(s) orally 3 times a day     orlistat  (ALLI ) 60 MG capsule Take 60 mg by mouth 3 (three) times daily with meals.     Oxycodone  HCl 10 MG TABS Take 10 mg by mouth every 4 (four) hours as needed.     pyridOXINE (B-6) 50 MG tablet 1 tab(s) orally once a day     riTUXimab  (RITUXAN ) 500 MG/50ML injection Inject 10 mg into the vein every 6 (six) months.     Sodium Sulfate-Mag Sulfate-KCl (SUTAB ) [phone]  MG TABS See admin instructions. follow package directions     triamcinolone  ointment (KENALOG ) 0.5 % Apply topically 2 (two) times daily. APPLY TOPICALLY TO THE AFFECTED AREA TWICE DAILY (Patient taking differently: Apply 1 Application topically 2 (two) times daily as needed (rash).) 30 g 2   valACYclovir  (VALTREX ) 500 MG tablet TAKE 1 TABLET(500 MG) BY MOUTH DAILY 90 tablet 1   venlafaxine  XR (EFFEXOR -XR) 150 MG 24 hr capsule TAKE 1 CAPSULE(150 MG) BY MOUTH DAILY WITH BREAKFAST 90 capsule 3   venlafaxine  XR (EFFEXOR -XR) 75 MG 24 hr capsule Take 75 mg by mouth every other day.     Current Facility-Administered Medications on File Prior to Visit  Medication Dose Route Frequency Provider Last Rate Last Admin   Romosozumab -aqqg (EVENITY ) 105 MG/1. injection 210 mg  210 mg Subcutaneous Q30 days Geofm Glade PARAS, MD       sodium chloride  flush (NS) 0.9 % injection 10 mL  10 mL Intravenous PRN Sebastian Lamarr SAUNDERS, PA-C   20 mL at 07/16/21 1025   "

## 2024-07-26 ENCOUNTER — Ambulatory Visit: Admitting: Internal Medicine

## 2024-07-26 VITALS — BP 116/74 | HR 72 | Temp 98.3°F | Ht 63.0 in | Wt 122.0 lb

## 2024-07-26 DIAGNOSIS — E039 Hypothyroidism, unspecified: Secondary | ICD-10-CM | POA: Diagnosis not present

## 2024-07-26 DIAGNOSIS — I10 Essential (primary) hypertension: Secondary | ICD-10-CM | POA: Diagnosis not present

## 2024-07-26 DIAGNOSIS — R413 Other amnesia: Secondary | ICD-10-CM

## 2024-07-26 DIAGNOSIS — M810 Age-related osteoporosis without current pathological fracture: Secondary | ICD-10-CM | POA: Diagnosis not present

## 2024-07-26 MED ORDER — ROMOSOZUMAB-AQQG 105 MG/1.17ML ~~LOC~~ SOSY: 210.0000 mg | PREFILLED_SYRINGE | Freq: Once | SUBCUTANEOUS | Status: AC

## 2024-07-26 NOTE — Assessment & Plan Note (Signed)
 Chronic Lab Results  Component Value Date   TSH 1.33 02/01/2024   Clinically euthyroid Continue levothyroxine   25 mcg daily

## 2024-07-26 NOTE — Patient Instructions (Signed)
" ° ° °  Medications changes include :   None    A referral was ordered for [facility] and someone will call you to schedule an appointment.    "

## 2024-07-26 NOTE — Addendum Note (Signed)
 Addended by: CLAUDENE TOBIAS PARAS on: 07/26/2024 01:33 PM   Modules accepted: Orders

## 2024-07-26 NOTE — Assessment & Plan Note (Signed)
 Chronic Blood pressure is well-controlled No lightheadedness Monitor BP at home Continue amlodipine  2.5 mg daily

## 2024-07-26 NOTE — Addendum Note (Signed)
 Addended by: GEOFM GLADE PARAS on: 07/26/2024 01:14 PM   Modules accepted: Level of Service

## 2024-07-26 NOTE — Assessment & Plan Note (Signed)
 Chronic Continue regular exercise Continue calcium and vitamin D  Evenity  given today

## 2024-07-28 ENCOUNTER — Encounter: Payer: Self-pay | Admitting: Internal Medicine

## 2024-07-30 MED ORDER — ROMOSOZUMAB-AQQG 105 MG/1.17ML ~~LOC~~ SOSY: 210.0000 mg | PREFILLED_SYRINGE | Freq: Once | SUBCUTANEOUS | Status: AC

## 2024-07-30 NOTE — Addendum Note (Signed)
 Addended by: CLAUDENE TOBIAS PARAS on: 07/30/2024 09:04 AM   Modules accepted: Orders

## 2024-09-02 ENCOUNTER — Ambulatory Visit: Payer: Medicare Other

## 2024-09-03 ENCOUNTER — Ambulatory Visit

## 2024-10-08 ENCOUNTER — Ambulatory Visit
